# Patient Record
Sex: Male | Born: 1940
Health system: Southern US, Community
[De-identification: ages and names within clinical notes are randomized; demographics above are authoritative.]

## PROBLEM LIST (undated history)

## (undated) DIAGNOSIS — M4802 Spinal stenosis, cervical region: Secondary | ICD-10-CM

## (undated) DIAGNOSIS — Q600 Renal agenesis, unilateral: Secondary | ICD-10-CM

## (undated) DIAGNOSIS — K219 Gastro-esophageal reflux disease without esophagitis: Secondary | ICD-10-CM

## (undated) DIAGNOSIS — Z86711 Personal history of pulmonary embolism: Secondary | ICD-10-CM

## (undated) DIAGNOSIS — I499 Cardiac arrhythmia, unspecified: Secondary | ICD-10-CM

## (undated) DIAGNOSIS — I639 Cerebral infarction, unspecified: Secondary | ICD-10-CM

## (undated) DIAGNOSIS — R911 Solitary pulmonary nodule: Secondary | ICD-10-CM

## (undated) DIAGNOSIS — Z8719 Personal history of other diseases of the digestive system: Secondary | ICD-10-CM

## (undated) DIAGNOSIS — G4733 Obstructive sleep apnea (adult) (pediatric): Secondary | ICD-10-CM

## (undated) DIAGNOSIS — I10 Essential (primary) hypertension: Secondary | ICD-10-CM

## (undated) DIAGNOSIS — F341 Dysthymic disorder: Secondary | ICD-10-CM

## (undated) DIAGNOSIS — F32A Depression, unspecified: Secondary | ICD-10-CM

## (undated) DIAGNOSIS — N62 Hypertrophy of breast: Secondary | ICD-10-CM

## (undated) DIAGNOSIS — S46219A Strain of muscle, fascia and tendon of other parts of biceps, unspecified arm, initial encounter: Secondary | ICD-10-CM

## (undated) DIAGNOSIS — J45909 Unspecified asthma, uncomplicated: Secondary | ICD-10-CM

## (undated) DIAGNOSIS — M1990 Unspecified osteoarthritis, unspecified site: Secondary | ICD-10-CM

## (undated) DIAGNOSIS — H9190 Unspecified hearing loss, unspecified ear: Secondary | ICD-10-CM

## (undated) DIAGNOSIS — I82409 Acute embolism and thrombosis of unspecified deep veins of unspecified lower extremity: Secondary | ICD-10-CM

## (undated) DIAGNOSIS — R519 Headache, unspecified: Secondary | ICD-10-CM

## (undated) DIAGNOSIS — M109 Gout, unspecified: Secondary | ICD-10-CM

## (undated) DIAGNOSIS — E669 Obesity, unspecified: Secondary | ICD-10-CM

## (undated) DIAGNOSIS — I2510 Atherosclerotic heart disease of native coronary artery without angina pectoris: Secondary | ICD-10-CM

## (undated) DIAGNOSIS — E291 Testicular hypofunction: Secondary | ICD-10-CM

## (undated) DIAGNOSIS — Z9289 Personal history of other medical treatment: Secondary | ICD-10-CM

## (undated) DIAGNOSIS — G8929 Other chronic pain: Secondary | ICD-10-CM

## (undated) DIAGNOSIS — F419 Anxiety disorder, unspecified: Secondary | ICD-10-CM

## (undated) DIAGNOSIS — Z7901 Long term (current) use of anticoagulants: Secondary | ICD-10-CM

## (undated) DIAGNOSIS — T7840XA Allergy, unspecified, initial encounter: Secondary | ICD-10-CM

## (undated) DIAGNOSIS — I700 Atherosclerosis of aorta: Secondary | ICD-10-CM

## (undated) DIAGNOSIS — R202 Paresthesia of skin: Secondary | ICD-10-CM

## (undated) DIAGNOSIS — M75100 Unspecified rotator cuff tear or rupture of unspecified shoulder, not specified as traumatic: Secondary | ICD-10-CM

## (undated) DIAGNOSIS — C801 Malignant (primary) neoplasm, unspecified: Secondary | ICD-10-CM

## (undated) DIAGNOSIS — Z9889 Other specified postprocedural states: Secondary | ICD-10-CM

## (undated) HISTORY — DX: Acute embolism and thrombosis of unspecified deep veins of unspecified lower extremity: I82.409

## (undated) HISTORY — DX: Allergy, unspecified, initial encounter: T78.40XA

## (undated) HISTORY — DX: Hypertrophy of breast: N62

## (undated) HISTORY — DX: Cardiac arrhythmia, unspecified: I49.9

## (undated) HISTORY — DX: Atherosclerosis of aorta: I70.0

## (undated) HISTORY — DX: Spinal stenosis, cervical region: M48.02

## (undated) HISTORY — PX: TONSILLECTOMY: SUR1361

## (undated) HISTORY — PX: NEPHRECTOMY: SHX65

## (undated) HISTORY — PX: JOINT REPLACEMENT: SHX530

## (undated) HISTORY — DX: Gastro-esophageal reflux disease without esophagitis: K21.9

## (undated) HISTORY — DX: Obstructive sleep apnea (adult) (pediatric): G47.33

## (undated) HISTORY — DX: Testicular hypofunction: E29.1

## (undated) HISTORY — DX: Unspecified rotator cuff tear or rupture of unspecified shoulder, not specified as traumatic: M75.100

## (undated) HISTORY — PX: SHOULDER ARTHROSCOPY: SHX128

## (undated) HISTORY — DX: Headache, unspecified: R51.9

## (undated) HISTORY — DX: Dysthymic disorder: F34.1

## (undated) HISTORY — DX: Strain of muscle, fascia and tendon of other parts of biceps, unspecified arm, initial encounter: S46.219A

## (undated) HISTORY — DX: Renal agenesis, unilateral: Q60.0

## (undated) HISTORY — DX: Obesity, unspecified: E66.9

## (undated) HISTORY — DX: Paresthesia of skin: R20.2

## (undated) HISTORY — DX: Unspecified hearing loss, unspecified ear: H91.90

## (undated) HISTORY — DX: Other chronic pain: G89.29

## (undated) HISTORY — DX: Depression, unspecified: F32.A

## (undated) HISTORY — PX: COLON SURGERY: SHX602

---

## 1962-11-05 HISTORY — PX: WRIST GANGLION EXCISION: SUR520

## 1998-04-11 ENCOUNTER — Ambulatory Visit (HOSPITAL_COMMUNITY): Admission: RE | Admit: 1998-04-11 | Discharge: 1998-04-11 | Payer: Self-pay | Admitting: Gastroenterology

## 1998-09-22 ENCOUNTER — Ambulatory Visit (HOSPITAL_BASED_OUTPATIENT_CLINIC_OR_DEPARTMENT_OTHER): Admission: RE | Admit: 1998-09-22 | Discharge: 1998-09-22 | Payer: Self-pay | Admitting: Orthopedic Surgery

## 2000-01-10 ENCOUNTER — Encounter: Payer: Self-pay | Admitting: Family Medicine

## 2000-01-10 ENCOUNTER — Ambulatory Visit (HOSPITAL_COMMUNITY): Admission: RE | Admit: 2000-01-10 | Discharge: 2000-01-10 | Payer: Self-pay

## 2000-08-26 ENCOUNTER — Ambulatory Visit: Admission: RE | Admit: 2000-08-26 | Discharge: 2000-08-26 | Payer: Self-pay | Admitting: Family Medicine

## 2002-04-09 ENCOUNTER — Ambulatory Visit (HOSPITAL_COMMUNITY): Admission: RE | Admit: 2002-04-09 | Discharge: 2002-04-09 | Payer: Self-pay | Admitting: Family Medicine

## 2002-04-09 ENCOUNTER — Encounter: Payer: Self-pay | Admitting: Family Medicine

## 2002-06-01 ENCOUNTER — Emergency Department (HOSPITAL_COMMUNITY): Admission: EM | Admit: 2002-06-01 | Discharge: 2002-06-01 | Payer: Self-pay | Admitting: Emergency Medicine

## 2002-11-25 ENCOUNTER — Encounter: Admission: RE | Admit: 2002-11-25 | Discharge: 2002-11-25 | Payer: Self-pay | Admitting: Gastroenterology

## 2002-11-25 ENCOUNTER — Encounter: Payer: Self-pay | Admitting: Gastroenterology

## 2002-12-18 ENCOUNTER — Encounter: Payer: Self-pay | Admitting: Gastroenterology

## 2002-12-18 ENCOUNTER — Ambulatory Visit (HOSPITAL_COMMUNITY): Admission: RE | Admit: 2002-12-18 | Discharge: 2002-12-18 | Payer: Self-pay | Admitting: Gastroenterology

## 2003-03-30 ENCOUNTER — Ambulatory Visit (HOSPITAL_BASED_OUTPATIENT_CLINIC_OR_DEPARTMENT_OTHER): Admission: RE | Admit: 2003-03-30 | Discharge: 2003-03-30 | Payer: Self-pay | Admitting: Orthopedic Surgery

## 2003-10-11 ENCOUNTER — Ambulatory Visit (HOSPITAL_COMMUNITY): Admission: RE | Admit: 2003-10-11 | Discharge: 2003-10-11 | Payer: Self-pay | Admitting: Gastroenterology

## 2004-01-11 ENCOUNTER — Ambulatory Visit (HOSPITAL_COMMUNITY): Admission: RE | Admit: 2004-01-11 | Discharge: 2004-01-11 | Payer: Self-pay | Admitting: Family Medicine

## 2006-10-11 ENCOUNTER — Ambulatory Visit: Payer: Self-pay | Admitting: Internal Medicine

## 2006-11-19 ENCOUNTER — Ambulatory Visit: Admission: RE | Admit: 2006-11-19 | Discharge: 2006-11-19 | Payer: Self-pay | Admitting: Internal Medicine

## 2006-11-19 ENCOUNTER — Ambulatory Visit: Payer: Self-pay | Admitting: Internal Medicine

## 2006-12-09 ENCOUNTER — Ambulatory Visit: Payer: Self-pay | Admitting: Internal Medicine

## 2006-12-09 LAB — CONVERTED CEMR LAB
BUN: 23 mg/dL (ref 6–23)
Basophils Absolute: 0 10*3/uL (ref 0.0–0.1)
Creatinine, Ser: 1.5 mg/dL (ref 0.4–1.5)
Eosinophils Absolute: 0.3 10*3/uL (ref 0.0–0.6)
GFR calc non Af Amer: 50 mL/min
HCT: 39.3 % (ref 39.0–52.0)
Hemoglobin: 14.1 g/dL (ref 13.0–17.0)
MCHC: 35.8 g/dL (ref 30.0–36.0)
MCV: 87.9 fL (ref 78.0–100.0)
Monocytes Absolute: 0.6 10*3/uL (ref 0.2–0.7)
Monocytes Relative: 10.3 % (ref 3.0–11.0)
Neutrophils Relative %: 58.1 % (ref 43.0–77.0)
Potassium: 4 meq/L (ref 3.5–5.1)
RBC: 4.47 M/uL (ref 4.22–5.81)
RDW: 12.6 % (ref 11.5–14.6)
Sodium: 141 meq/L (ref 135–145)
TSH: 2.33 microintl units/mL (ref 0.35–5.50)

## 2006-12-16 ENCOUNTER — Ambulatory Visit (HOSPITAL_COMMUNITY): Admission: RE | Admit: 2006-12-16 | Discharge: 2006-12-16 | Payer: Self-pay | Admitting: Internal Medicine

## 2006-12-18 ENCOUNTER — Ambulatory Visit: Payer: Self-pay

## 2006-12-18 ENCOUNTER — Ambulatory Visit: Payer: Self-pay | Admitting: *Deleted

## 2006-12-20 ENCOUNTER — Ambulatory Visit: Payer: Self-pay

## 2006-12-20 LAB — CONVERTED CEMR LAB: INR: 1.6 (ref 0.9–2.0)

## 2006-12-23 ENCOUNTER — Ambulatory Visit: Payer: Self-pay

## 2006-12-23 LAB — CONVERTED CEMR LAB
Basophils Absolute: 0 10*3/uL (ref 0.0–0.1)
Eosinophils Absolute: 0.3 10*3/uL (ref 0.0–0.6)
Eosinophils Relative: 6.2 % — ABNORMAL HIGH (ref 0.0–5.0)
HCT: 37.2 % — ABNORMAL LOW (ref 39.0–52.0)
Hemoglobin: 13.1 g/dL (ref 13.0–17.0)
Lymphocytes Relative: 27.1 % (ref 12.0–46.0)
MCHC: 35.3 g/dL (ref 30.0–36.0)
MCV: 89 fL (ref 78.0–100.0)
Monocytes Absolute: 0.5 10*3/uL (ref 0.2–0.7)
Neutro Abs: 2.6 10*3/uL (ref 1.4–7.7)
Neutrophils Relative %: 56.1 % (ref 43.0–77.0)
WBC: 4.6 10*3/uL (ref 4.5–10.5)

## 2006-12-26 ENCOUNTER — Ambulatory Visit: Payer: Self-pay | Admitting: Cardiology

## 2007-01-02 ENCOUNTER — Ambulatory Visit: Payer: Self-pay | Admitting: *Deleted

## 2007-01-06 ENCOUNTER — Ambulatory Visit: Payer: Self-pay | Admitting: Cardiovascular Disease

## 2007-01-10 ENCOUNTER — Ambulatory Visit: Payer: Self-pay | Admitting: Internal Medicine

## 2007-02-09 ENCOUNTER — Emergency Department (HOSPITAL_COMMUNITY): Admission: EM | Admit: 2007-02-09 | Discharge: 2007-02-09 | Payer: Self-pay | Admitting: Emergency Medicine

## 2007-04-08 ENCOUNTER — Ambulatory Visit: Payer: Self-pay | Admitting: Internal Medicine

## 2008-10-15 ENCOUNTER — Inpatient Hospital Stay (HOSPITAL_COMMUNITY): Admission: RE | Admit: 2008-10-15 | Discharge: 2008-10-19 | Payer: Self-pay | Admitting: Orthopedic Surgery

## 2008-10-15 HISTORY — PX: TOTAL KNEE ARTHROPLASTY: SHX125

## 2008-11-05 HISTORY — PX: CARPAL TUNNEL RELEASE: SHX101

## 2008-11-10 ENCOUNTER — Encounter: Admission: RE | Admit: 2008-11-10 | Discharge: 2008-11-29 | Payer: Self-pay | Admitting: Orthopedic Surgery

## 2009-01-03 DIAGNOSIS — D369 Benign neoplasm, unspecified site: Secondary | ICD-10-CM

## 2009-01-03 HISTORY — DX: Benign neoplasm, unspecified site: D36.9

## 2009-07-12 ENCOUNTER — Encounter: Admission: RE | Admit: 2009-07-12 | Discharge: 2009-07-12 | Payer: Self-pay | Admitting: Family Medicine

## 2009-11-11 ENCOUNTER — Telehealth (INDEPENDENT_AMBULATORY_CARE_PROVIDER_SITE_OTHER): Payer: Self-pay | Admitting: *Deleted

## 2010-12-05 NOTE — Progress Notes (Signed)
Summary: Record request from [facility]  Request for records received from [facility]. Request forwarded to [facility]. Dena Chavis  November 11, 2009 4:17 PM

## 2011-03-20 NOTE — Op Note (Signed)
[name], [name] NO.:  [id number]   MEDICAL RECORD NO.:  [id number]          PATIENT TYPE:  INP   LOCATION:  2899                         FACILITY:  [facility]   PHYSICIAN:  Ollen Gross, M.D.    DATE OF BIRTH:  1941-07-21   DATE OF PROCEDURE:  10/15/2008  DATE OF DISCHARGE:                               OPERATIVE REPORT   PREOPERATIVE DIAGNOSES:  Osteoarthritis, left knee.   POSTOPERATIVE DIAGNOSES:  Osteoarthritis, left knee.   PROCEDURE:  Left total knee arthroplasty.   SURGEON:  Ollen Gross, M.D.   ASSISTANT:  Avel Peace PA-C   ANESTHESIA:  Spinal with Duramorph.   ESTIMATED BLOOD LOSS:  Minimal.   DRAIN:  Hemovac x1.   TOURNIQUET TIME:  41 minutes at 300 mmHg.   COMPLICATIONS:  None.   CONDITION:  Stable to recovery room.   BRIEF CLINICAL NOTE:  [name] is a 70 year old male who has end-  stage arthritis of the left knee with progressively worsening pain and  dysfunction.  He has failed nonoperative management and presents for  total knee arthroplasty.   PROCEDURE IN DETAIL:  After successful administration of spinal  anesthetic a tourniquet was placed on the left thigh and left lower  extremity prepped and draped in the usual sterile fashion.  Extremity  was wrapped in Esmarch, knee flexed, tourniquet inflated to 300 mmHg.  Midline incision made with 10-blade through subcutaneous tissue to the  level of the extensor mechanism.  A fresh blade is used make a medial  parapatellar arthrotomy.  Soft tissue of the proximal medial tibia is  subperiosteally elevated to the joint line with the knife, into the  semimembranosus bursa with a Cobb elevator.  Soft tissue laterally is  elevated with attention being paid to avoiding the patellar tendon on  tibial tubercle.  Patella subluxed laterally, knee flexed 90 degrees and  ACL and PCL removed.  Drill was used create a starting hole in the  distal femur and the canal was thoroughly irrigated.   The 5 degree left  valgus alignment guide is placed referencing off posterior condyles,  rotations marked and a block pinned to remove 11 mm of the distal femur.  It took11 because of preop flexion contracture.  Distal femoral  resection is made with an oscillating saw.  Sizing blocks placed, size 5  is most appropriate.  Rotation is marked off the epicondylar axis.  Size  5 cutting blocks placed and the anterior, posterior and chamfer cuts  made.   Tibia is subluxed forward and the menisci are removed.  Extramedullary  cutting guide is placed, referencing proximally at the medial aspect of  the tibial tubercle and distally along the second metatarsal axis and  tibial crest.  The block is pinned to remove about 2 mL off the more  deficient medial side.  Tibial resection is made with an oscillating  saw.  Size 5 is the most appropriate tibial component and the proximal  tibia is prepared with the modular drill and keel punch for the size 5.  Femoral preparation is completed with the intercondylar cut.  Size 5 mobile bearing tibial trial, size 5 posterior stabilized femoral  trial and a 10 mm posterior stabilized rotating platform insert trial  are placed.  With the 10 there is limited varus-valgus, replaced with  the 12.5 which allowed for full extension with excellent varus and  valgus anterior and posterior balance throughout full range of motion.  Patella was then everted, thickness measured to be 27 mm.  Freehand  resection was taken to 15 mm, a 41 template is placed, lug holes are  drilled, trial patella is placed and it tracks normally.  Osteophytes  removed off the posterior femur with the trial placed and all trials  removed and the cut bone surfaces are prepared with pulsatile lavage.   Cement is mixed and once ready for implantation a size 5 mobile bearing  tibial tray, size 5 posterior stabilized femur and 41 patella are  cemented into place.  The patella is held with a  clamp.  Trial 12.5-mm  inserts placed, knee held in full extension and all extruded cement  removed.  Cement was fully hardened.  Then the permanent 12.5 mm  posterior stabilized rotating platform insert is placed into the tibial  tray.  Wound is copiously irrigated with saline solution and FloSeal  injected on the posterior capsule, medial and lateral gutters and  suprapatellar area.  Moist sponge is placed and the tourniquet is  released for total time of 41 minutes.   He had some oozing due to removal of a lot of spurs and synovium.  The  cautery was used to coagulate the oozing points.  I then thoroughly  irrigated again and closed the arthrotomy over a Hemovac drain with  interrupted #1 PDS.  Flexion against gravity is 130 degrees.  Subcu was  closed with interrupted 2-0 Vicryl and subcuticular running 4-0  Monocryl.  The incision is cleaned and dried and Steri-Strips and a  bulky sterile dressing applied.  Drains hooked to suction.  Knee is  placed into a knee immobilizer, awakened and transported to recovery in  stable condition.      Ollen Gross, M.D.  Electronically Signed     FA/MEDQ  D:  10/15/2008  T:  10/15/2008  Job:  [id number]

## 2011-03-20 NOTE — H&P (Signed)
Browning, Joseph              ACCOUNT NO.:  [id number]   MEDICAL RECORD NO.:  [id number]           PATIENT TYPE:   LOCATION:                                 FACILITY:   PHYSICIAN:  Ollen Gross, M.D.         DATE OF BIRTH:   DATE OF ADMISSION:  10/15/2008  DATE OF DISCHARGE:                              HISTORY & PHYSICAL   CHIEF COMPLAINT:  Left knee pain.   HISTORY OF PRESENT ILLNESS:  The patient is a 70 year old male seen by  Dr. Lequita Halt who is the husband of a previous patient.  [name] has  had ongoing problems with bilateral knee pain.  The left knee is more  symptomatic and problematic than the right.  He is found to have end-  stage arthritis, moderate-to-advanced on the right but even more severe  on the left.  It has been getting much worse as time goes by.  He has  been treated conservatively in the past with multiple cortisone  injections and also gel injections.  He has reached a point where he  would like to have something done about it.  He has been seen by Dr.  Cliffton Asters preoperatively and felt to be stable for surgery.  He is on  Coumadin preoperatively which will be stopped 5 days preoperatively.  He  will be placed on Lovenox bridge immediately postop along with his  Coumadin.  The patient was subsequently admitted to [facility]   ALLERGIES:  No known drug allergies.   INTOLERANCES:  CODEINE causes him to pass out.  He is also unable to  take VICODIN, HYDROCODONE, PERCOCET, OXYCODONE, or any CODEINE  DERIVATIVES.   CURRENT MEDICATIONS:  Lexapro, Lamictal, Depakote, warfarin, generic  Ambien, Testrin, Tylenol.   PAST MEDICAL HISTORY:  1. Some high-pitched hearing loss.  2. Asthma.  3. History of DVTs and also PE.  4. Arthritis.  5. Childhood illnesses of measles and mumps.   PAST SURGICAL HISTORY:  1. Ganglion surgery.  2. Wrist surgery.  3. Shoulder surgery.  4. Right knee surgery.   SOCIAL HISTORY:  Father with history of diabetes  and blood clots.  Mother deceased, history of colon cancer.  The patient has 3 siblings,  all healthy and 4 children, all healthy.   SOCIAL HISTORY:  Married.  Works in [employer].  Past smoker.  Drinks about 1-  3 beers per week.  4 children.  He does have a caregiver lined up after  surgery.   REVIEW OF SYSTEMS:  GENERAL:  No fevers, chills, occasional night  sweats.  NEUROLOGIC:  No seizures, syncope, or paralysis.  He does have  a little bit of high-pitched hearing loss.  RESPIRATORY:  No shortness  of breath at rest.  Gets a little short of breath on exertion.  No  productive cough or hemoptysis.  CARDIOVASCULAR:  No chest pain,  orthopnea.  GI:  No nausea, vomiting, diarrhea, or constipation.  GU:  No dysuria, hematuria, or discharge.  MUSCULOSKELETAL:  Left knee.   PHYSICAL EXAMINATION:  VITAL SIGNS:  Pulse 78, respiration 16,  blood  pressure 132/78.  GENERAL:  A 70 year old white male, well nourished, well developed, in  no acute distress.  He is alert, oriented, cooperative, pleasant,  accompanied by his wife.  HEENT:  Normocephalic, atraumatic.  Pupils are equal, round, and  reactive.  EOMs intact.  Oropharynx is clear.  Only reading glasses.  NECK:  Supple.  No bruits.  CHEST: Clear.  HEART:  Regular rate and rhythm.  No murmur.  ABDOMEN:  Soft, round, slightly protuberant abdomen.  Bowel sounds  present.  RECTAL:  Not done.  BREASTS:  Not done.  GENITALIA:  Not done.  EXTREMITIES:  Left knee, trace effusion, moderate crepitus, range of  motion of 5-110.   IMPRESSION:  Osteoarthritis of the left knee.   PLAN:  The patient admitted to [facility] to undergo a left  total knee replacement arthroplasty.  Surgery will be performed by Dr.  Ollen Gross.      [name], P.A.C.      Ollen Gross, M.D.  Electronically Signed    ALP/MEDQ  D:  10/14/2008  T:  10/15/2008  Job:  [id number]   cc:   Stacie Acres. Cliffton Asters, M.D.  Ollen Gross, M.D.

## 2011-03-23 NOTE — Assessment & Plan Note (Signed)
[name]                             PULMONARY OFFICE NOTE   NAME:APPELGETYomar, [name]                     MRN:          [id number]  DATE:04/09/2007                            DOB:          1941/09/05    PULMONARY CONSULTATION:   REASON FOR CONSULTATION:  Preoperative evaluation.   HISTORY:  This is an exceptionally complicated 70 year old white male  whom I saw for chronic dyspnea on exertion related to morbid obesity  dating back now 2-3 years.  His workup showed that he desaturated with  exercise and had a CT scan subsequently in February of 2008 showing a  pulmonary embolism with negative venous Dopplers.  He was started on  Coumadin and also a methacholine challenge test was done to further work-  up his dyspnea, which was positive for reduction in air flow, but did  not reproduce his symptoms.   Since that time, he states he has been gradually improving with weight  loss (peak weight was 241 and now down to 225).  He says he can walk  about 3 miles an hour for 7 minutes up a slight grade before he has to  stop on his treadmill.  He is able to mow grass up to an hour and a half  without stopping at all.  He says he is using his Combivent no more than  twice weekly while being maintained on Symbicort 2 puffs b.i.d. (does  not know the strength, however).  He denies any nocturnal symptoms or  early morning exacerbations of coughing or wheezing and is able to lie  flat all night.  He is now considering left knee surgery.   There has been no real variability in terms of his dyspnea with weather,  environmental change or obvious triggers other than exercise in terms of  dyspnea.   PAST MEDICAL HISTORY:  1. Morbid obesity with a target weight of around 180.  2. Status post multiple orthopedic surgeries.  3. Gastroesophageal reflux disease status post esophageal stretching      in December of 2004.   ALLERGIES:  1. CODEINE.  2. NOVOCAIN.  3.  ALL THE NON-STEROIDALS.   MEDICATIONS:  1. Ambien 10 mg q.h.s.  2. Wellbutrin 300 mg daily.  3. Hydrochlorothiazide 25 mg q.a.m.  4. Coumadin 5 mg daily.  5. Depakote 500 mg daily.  6. Symbicort unspecified strength two puffs b.i.d.  7. Combivent 2-4 puffs a week.   SOCIAL HISTORY:  He quit smoking 24 years ago.  He has no unusual  exposure history.   FAMILY HISTORY:  Reviewed in detail and significant for the absence of  family history of asthma or atrophy.  Positive for colon cancer in his  mother and rheumatism in his father.   REVIEW OF SYSTEMS:  Taken in detail and negative except as outlined  above.   PHYSICAL EXAMINATION:  GENERAL:  This is an ambulatory white male with  an unusual affect and attitude.  VITAL SIGNS:  He is afebrile with stable vital signs with a weight now  of 225.  Blood pressure is 118/60.  HEENT:  Unremarkable.  Oropharynx clear.  NECK:  Supple without cervical adenopathy or tenderness.  Trachea is  midline.  No thyromegaly.  LUNGS:  Lung fields are perfectly clear bilaterally to auscultation and  percussion.  HEART:  Regular rate and rhythm without murmur, gallop, or rub.  ABDOMEN:  Soft, benign.  EXTREMITIES:  Warm without calf tenderness.  No clubbing, cyanosis, or  edema.   Hemosaturation 99% on room air.   IMPRESSION:  Chronic dyspnea on exertion associated with pulmonary  embolism on CT scan, but negative venous Dopplers.  He has now been on  Coumadin for 4 months with no evidence clinically of right heart strain,  and therefore most likely has good right ventricular reserve and no  significant risks for immediate deep vein thrombosis/pulmonary embolism  for surgery.  Ideally, we would like to wait 6 months after a diagnosis  of pulmonary embolism before proceeding to surgery, but since this was a  relatively small peripheral pulmonary embolism, I think the total clot  burden was very low, and his risk, therefore, is also very low.   Perioperative risk is also very low.   In terms of his asthma control, it appears to be excellent.  Note that  although he does have a positive methacholine challenge test, he really  does not have the clinical pattern of an asthmatic.  His pattern, in  fact, has been more of one of chronic obesity with deconditioning which  is improving with weight loss and will improve further once his knee has  been repaired.   Therefore, he is cleared for surgery, but I would strongly recommend  that he be covered perioperatively with IV heparin, and then started  back on Coumadin immediately as soon as hemostasis is assured.     [name]. Sherene Sires, MD, [facility]  Electronically Signed    MBW/MedQ  DD: 04/09/2007  DT: 04/09/2007  Job #: [id number]   cc:   [name]. [name], M.D.

## 2011-03-23 NOTE — Discharge Summary (Signed)
[name], [name]              ACCOUNT NO.:  [id number]   MEDICAL RECORD NO.:  [id number]          PATIENT TYPE:  INP   LOCATION:  5013                         FACILITY:  [facility]   PHYSICIAN:  Ollen Gross, M.D.    DATE OF BIRTH:  1941/04/21   DATE OF ADMISSION:  10/15/2008  DATE OF DISCHARGE:  10/19/2008                               DISCHARGE SUMMARY   ADMITTING DIAGNOSES:  1. Osteoarthritis of left knee.  2. High-pitch hearing loss.  3. Asthma.  4. History of deep vein thromboses and pulmonary embolism.  5. Arthritis.  6. Childhood illnesses measles and mumps.   DISCHARGE DIAGNOSES:  1. Osteoarthritis of left knee, status post left total knee      replacement and arthroplasty.  2. Postoperative blood loss anemia, did not require transfusion.  3. Mild postoperative hyponatremia, improved.  4. High-pitch hearing loss.  5. Asthma.  6. History of deep vein thromboses and pulmonary embolism.  7. Arthritis.  8. Childhood illnesses measles and mumps.   PROCEDURE:  October 15, 2008, left total knee.   SURGEON:  Ollen Gross, M.D.   ASSISTANT:  Alexzandrew L. Perkins, P.A.C.   ANESTHESIA:  Spinal with Duramorph.   CONSULTS:  None.   BRIEF HISTORY:  [name] is a 70 year old male with end-stage  arthritis of left knee, progressive worsening pain.  He has failed  nonoperative management, now presents for total knee arthroplasty.   LABORATORY DATA:  Preop CBC, hemoglobin of 15.5, hematocrit of 44.9,  white cell count 5.4, and platelets 233.  Postop hemoglobin 12.3, then  down to 9.4, then 8.8, last noted H&H was 8.4 and 23.7.  PT/PTT preop  14.6 and 30 respectively.  INR 1.1.  Serial protimes followed per  Coumadin protocol.  Last noted PT/INR 22.6 and 1.9.  Chem panel, BUN on  admission within normal limits.  Postop sodium did go from 137 to 134,  back up to 137, creatinine did bump from 1.3 to 1.6, back down to 1.5.  Preop UA, small leukocyte esterase and 3-6 white  cells.  Followup UA,  small hemoglobin, only 0-2 white cells, 3-6 red cells, otherwise  negative.  Blood group type A+.  Urine culture no growth.   HOSPITAL COURSE:  The patient was admitted to [facility], taken  to the OR, and underwent above-stated procedure without complication.  The patient tolerated the procedure well, later transferred to recovery  room in orthopedic floor.  Started on PCA and p.r.n. analgesic pain  control following surgery, given 24 hours of postop IV antibiotics.  Doing pretty well on morning of day 1.  Hemovac was still there and was  removed.  The patient had some slight bump in his potassium and  creatinine, so treated with fluids and diuresed well.  Started getting  up with PT for ambulation.  By day 2, the patient is doing much better.  Dressing changed, incision looked good.  By day 2, the patient was up  walking about 20 or so feet, weaned over to p.o. meds, continued  progressive physical therapy.  By day 3, hemoglobin was down  lower to  8.8, but the patient was asymptomatic with a low hemoglobin, started on  iron, did have a little bit of a temperature on postop day 3.  Temperature went up to 101.5, so we checked a urinalysis which was  turned out to be negative.  Also, a chest x-ray was done which showed  some increased passive medial left lung base consistent with atelectasis  or pneumonia.  Recommend followup.  Encouraged incentive spirometer.  The patient did improve, and by postop day 4, temperature was back down.  The patient was doing better, weaned over to p.o. meds, walking greater  than 200 feet, and was discharged home.   DISCHARGE PLAN:  1. Discharged home on October 19, 2008.  2. Discharge diagnoses, please see above.  3. Discharge meds Ultracet, Robaxin, Coumadin, and iron.   FOLLOWUP:  Two weeks.   ACTIVITIES:  Total knee protocol, home health PT, home health nursing.   DISPOSITION:  Home.   CONDITION UPON DISCHARGE:   Improved.       Alexzandrew L. Perkins, P.A.C.      Ollen Gross, M.D.  Electronically Signed    ALP/MEDQ  D:  12/14/2008  T:  12/15/2008  Job:  [id number]   cc:   Ollen Gross, M.D.  Stacie Acres Cliffton Asters, M.D.

## 2011-07-06 ENCOUNTER — Other Ambulatory Visit: Payer: Self-pay | Admitting: Family Medicine

## 2011-07-06 ENCOUNTER — Ambulatory Visit
Admission: RE | Admit: 2011-07-06 | Discharge: 2011-07-06 | Disposition: A | Discharge status: Home / Self Care | Payer: Medicare Other | Source: Ambulatory Visit | Attending: Family Medicine | Admitting: Family Medicine

## 2011-07-06 DIAGNOSIS — M79669 Pain in unspecified lower leg: Secondary | ICD-10-CM

## 2011-08-07 ENCOUNTER — Other Ambulatory Visit (HOSPITAL_COMMUNITY): Payer: Self-pay | Admitting: Oncology

## 2011-08-07 ENCOUNTER — Encounter (HOSPITAL_BASED_OUTPATIENT_CLINIC_OR_DEPARTMENT_OTHER): Payer: Medicare Other | Admitting: Oncology

## 2011-08-07 DIAGNOSIS — I749 Embolism and thrombosis of unspecified artery: Secondary | ICD-10-CM

## 2011-08-07 DIAGNOSIS — Z86711 Personal history of pulmonary embolism: Secondary | ICD-10-CM

## 2011-08-07 DIAGNOSIS — D751 Secondary polycythemia: Secondary | ICD-10-CM

## 2011-08-07 DIAGNOSIS — Z7901 Long term (current) use of anticoagulants: Secondary | ICD-10-CM

## 2011-08-07 LAB — CBC WITH DIFFERENTIAL/PLATELET
BASO%: 0.3 % (ref 0.0–2.0)
Basophils Absolute: 0 10*3/uL (ref 0.0–0.1)
EOS%: 0.7 % (ref 0.0–7.0)
HGB: 18.6 g/dL — ABNORMAL HIGH (ref 13.0–17.1)
MCH: 31.9 pg (ref 27.2–33.4)
MCHC: 34.3 g/dL (ref 32.0–36.0)
MCV: 92.8 fL (ref 79.3–98.0)
MONO%: 8.6 % (ref 0.0–14.0)
RBC: 5.84 10*6/uL — ABNORMAL HIGH (ref 4.20–5.82)
RDW: 15.2 % — ABNORMAL HIGH (ref 11.0–14.6)

## 2011-08-07 LAB — PROTHROMBIN TIME: INR: 3.61 — ABNORMAL HIGH (ref <1.50)

## 2011-08-10 LAB — CBC
HCT: 24.9 % — ABNORMAL LOW (ref 39.0–52.0)
HCT: 26.6 % — ABNORMAL LOW (ref 39.0–52.0)
HCT: 35.3 % — ABNORMAL LOW (ref 39.0–52.0)
HCT: 44.9 % (ref 39.0–52.0)
Hemoglobin: 15.5 g/dL (ref 13.0–17.0)
Hemoglobin: 9.4 g/dL — ABNORMAL LOW (ref 13.0–17.0)
MCHC: 34.6 g/dL (ref 30.0–36.0)
MCHC: 34.7 g/dL (ref 30.0–36.0)
MCHC: 35.3 g/dL (ref 30.0–36.0)
MCHC: 35.6 g/dL (ref 30.0–36.0)
MCV: 90.4 fL (ref 78.0–100.0)
MCV: 91.5 fL (ref 78.0–100.0)
MCV: 91.8 fL (ref 78.0–100.0)
Platelets: 148 10*3/uL — ABNORMAL LOW (ref 150–400)
Platelets: 152 10*3/uL (ref 150–400)
Platelets: 204 10*3/uL (ref 150–400)
Platelets: 228 10*3/uL (ref 150–400)
Platelets: 233 10*3/uL (ref 150–400)
RDW: 12.3 % (ref 11.5–15.5)
RDW: 12.5 % (ref 11.5–15.5)
RDW: 12.9 % (ref 11.5–15.5)
RDW: 13 % (ref 11.5–15.5)
RDW: 13.1 % (ref 11.5–15.5)
WBC: 7.5 10*3/uL (ref 4.0–10.5)

## 2011-08-10 LAB — PROTIME-INR
INR: 1.1 (ref 0.00–1.49)
INR: 1.9 — ABNORMAL HIGH (ref 0.00–1.49)
INR: 2.3 — ABNORMAL HIGH (ref 0.00–1.49)
Prothrombin Time: 14.6 seconds (ref 11.6–15.2)
Prothrombin Time: 22.6 seconds — ABNORMAL HIGH (ref 11.6–15.2)
Prothrombin Time: 22.7 seconds — ABNORMAL HIGH (ref 11.6–15.2)
Prothrombin Time: 24 seconds — ABNORMAL HIGH (ref 11.6–15.2)

## 2011-08-10 LAB — URINALYSIS, ROUTINE W REFLEX MICROSCOPIC
Glucose, UA: NEGATIVE mg/dL
Leukocytes, UA: NEGATIVE
Nitrite: NEGATIVE
Nitrite: NEGATIVE
Protein, ur: NEGATIVE mg/dL
Specific Gravity, Urine: 1.012 (ref 1.005–1.030)
Urobilinogen, UA: 0.2 mg/dL (ref 0.0–1.0)
pH: 6 (ref 5.0–8.0)

## 2011-08-10 LAB — LACTATE DEHYDROGENASE: LDH: 175 U/L (ref 94–250)

## 2011-08-10 LAB — HYPERCOAGULABLE PANEL, COMPREHENSIVE
AntiThromb III Func: 123 % (ref 76–126)
Anticardiolipin IgA: 6 APL U/mL (ref <22)
Anticardiolipin IgG: 56 GPL U/mL — ABNORMAL HIGH (ref <23)
Anticardiolipin IgM: 18 MPL U/mL — ABNORMAL HIGH (ref <11)
Beta-2 Glyco I IgG: 1 G Units (ref <20)
Beta-2-Glycoprotein I IgM: 34 M Units — ABNORMAL HIGH (ref <20)
DRVVT: 110.3 secs — ABNORMAL HIGH (ref 34.1–42.2)
PTT Lupus Anticoagulant: 53.3 secs — ABNORMAL HIGH (ref 30.0–45.6)
PTTLA 4:1 Mix: 38.8 secs (ref 30.0–45.6)
Protein S Total: 49 % — ABNORMAL LOW (ref 60–150)

## 2011-08-10 LAB — COMPREHENSIVE METABOLIC PANEL
ALT: 18 U/L (ref 0–53)
Alkaline Phosphatase: 48 U/L (ref 39–117)
BUN: 16 mg/dL (ref 6–23)
CO2: 27 mEq/L (ref 19–32)
Calcium: 9.5 mg/dL (ref 8.4–10.5)
Creatinine, Ser: 1.19 mg/dL (ref 0.50–1.35)
Glucose, Bld: 95 mg/dL (ref 70–99)
Sodium: 137 mEq/L (ref 135–145)
Total Bilirubin: 0.5 mg/dL (ref 0.3–1.2)
Total Protein: 7 g/dL (ref 6.0–8.3)

## 2011-08-10 LAB — BASIC METABOLIC PANEL
BUN: 12 mg/dL (ref 6–23)
BUN: 16 mg/dL (ref 6–23)
CO2: 27 mEq/L (ref 19–32)
CO2: 28 mEq/L (ref 19–32)
Calcium: 7.6 mg/dL — ABNORMAL LOW (ref 8.4–10.5)
Chloride: 101 mEq/L (ref 96–112)
Creatinine, Ser: 1.6 mg/dL — ABNORMAL HIGH (ref 0.4–1.5)
GFR calc non Af Amer: 46 mL/min — ABNORMAL LOW (ref >60)
Glucose, Bld: 140 mg/dL — ABNORMAL HIGH (ref 70–99)
Glucose, Bld: 150 mg/dL — ABNORMAL HIGH (ref 70–99)
Potassium: 5 mEq/L (ref 3.5–5.1)

## 2011-08-10 LAB — DIFFERENTIAL
Basophils Absolute: 0 10*3/uL (ref 0.0–0.1)
Basophils Relative: 0 % (ref 0–1)
Eosinophils Absolute: 0.1 10*3/uL (ref 0.0–0.7)
Monocytes Relative: 12 % (ref 3–12)
Neutro Abs: 4.3 10*3/uL (ref 1.7–7.7)
Neutrophils Relative %: 70 % (ref 43–77)

## 2011-08-10 LAB — URINE MICROSCOPIC-ADD ON

## 2011-08-10 LAB — TYPE AND SCREEN
ABO/RH(D): A POS
Antibody Screen: NEGATIVE

## 2011-08-10 LAB — APTT: aPTT: 32 seconds (ref 24–37)

## 2011-08-10 LAB — ABO/RH: ABO/RH(D): A POS

## 2011-08-10 LAB — URINE CULTURE: Special Requests: NEGATIVE

## 2011-08-20 ENCOUNTER — Other Ambulatory Visit: Payer: Self-pay | Admitting: Family Medicine

## 2011-08-20 DIAGNOSIS — R911 Solitary pulmonary nodule: Secondary | ICD-10-CM

## 2011-08-22 ENCOUNTER — Ambulatory Visit
Admission: RE | Admit: 2011-08-22 | Discharge: 2011-08-22 | Disposition: A | Discharge status: Home / Self Care | Payer: Medicare Other | Source: Ambulatory Visit | Attending: Family Medicine | Admitting: Family Medicine

## 2011-08-22 DIAGNOSIS — R911 Solitary pulmonary nodule: Secondary | ICD-10-CM

## 2012-02-26 ENCOUNTER — Other Ambulatory Visit (HOSPITAL_COMMUNITY): Payer: Self-pay | Admitting: Urology

## 2012-02-26 DIAGNOSIS — R310 Gross hematuria: Secondary | ICD-10-CM

## 2012-02-26 DIAGNOSIS — N261 Atrophy of kidney (terminal): Secondary | ICD-10-CM

## 2012-03-03 ENCOUNTER — Encounter (HOSPITAL_COMMUNITY)
Admission: RE | Admit: 2012-03-03 | Discharge: 2012-03-03 | Disposition: A | Discharge status: Home / Self Care | Payer: Medicare Other | Source: Ambulatory Visit | Attending: Urology | Admitting: Urology

## 2012-03-03 DIAGNOSIS — N261 Atrophy of kidney (terminal): Secondary | ICD-10-CM

## 2012-03-03 DIAGNOSIS — N281 Cyst of kidney, acquired: Secondary | ICD-10-CM | POA: Insufficient documentation

## 2012-03-03 DIAGNOSIS — R310 Gross hematuria: Secondary | ICD-10-CM | POA: Insufficient documentation

## 2012-03-03 DIAGNOSIS — N269 Renal sclerosis, unspecified: Secondary | ICD-10-CM | POA: Insufficient documentation

## 2012-03-03 MED ORDER — TECHNETIUM TC 99M MERTIATIDE
15.0000 | Freq: Once | INTRAVENOUS | Status: AC | PRN
  Administered 2012-03-03: 15 via INTRAVENOUS

## 2012-03-03 MED ORDER — FUROSEMIDE 10 MG/ML IJ SOLN
40.0000 mg | Freq: Once | INTRAMUSCULAR | Status: DC
  Filled 2012-03-03: qty 4

## 2013-07-09 ENCOUNTER — Other Ambulatory Visit: Payer: Self-pay | Admitting: Urology

## 2013-07-10 ENCOUNTER — Encounter (HOSPITAL_BASED_OUTPATIENT_CLINIC_OR_DEPARTMENT_OTHER): Payer: Self-pay | Admitting: *Deleted

## 2013-07-13 ENCOUNTER — Encounter (HOSPITAL_BASED_OUTPATIENT_CLINIC_OR_DEPARTMENT_OTHER): Payer: Self-pay | Admitting: *Deleted

## 2013-07-13 NOTE — Progress Notes (Signed)
NPO AFTER MN. ARRIVES AT 1015. NEEDS HG AND PT/INR. WILL TAKE ACID REDUCER OTC AM OF SURG W/ SIP OF WATER. WILL DO HIBICLENS SHOWER AM OF SURG.

## 2013-07-20 ENCOUNTER — Ambulatory Visit (HOSPITAL_BASED_OUTPATIENT_CLINIC_OR_DEPARTMENT_OTHER): Admission: RE | Admit: 2013-07-20 | Payer: Medicare Other | Source: Ambulatory Visit | Admitting: Urology

## 2013-07-20 HISTORY — DX: Other specified postprocedural states: Z98.890

## 2013-07-20 HISTORY — DX: Gout, unspecified: M10.9

## 2013-07-20 HISTORY — DX: Personal history of other diseases of the digestive system: Z87.19

## 2013-07-20 HISTORY — DX: Obstructive sleep apnea (adult) (pediatric): G47.33

## 2013-07-20 HISTORY — DX: Solitary pulmonary nodule: R91.1

## 2013-07-20 HISTORY — DX: Unspecified osteoarthritis, unspecified site: M19.90

## 2013-07-20 HISTORY — DX: Gastro-esophageal reflux disease without esophagitis: K21.9

## 2013-07-20 HISTORY — DX: Long term (current) use of anticoagulants: Z79.01

## 2013-07-20 HISTORY — DX: Personal history of pulmonary embolism: Z86.711

## 2013-07-20 SURGERY — EXCISION LIPOMA
Anesthesia: General | Laterality: Right

## 2014-01-04 NOTE — Progress Notes (Signed)
Need orders in EPIC.  Surgery scheduled on 01/18/14.  Preop on 01/13/14 at 900am.  Thank You.

## 2014-01-05 ENCOUNTER — Other Ambulatory Visit: Payer: Self-pay | Admitting: Orthopedic Surgery

## 2014-01-11 ENCOUNTER — Encounter (HOSPITAL_COMMUNITY): Payer: Self-pay | Admitting: Pharmacy Technician

## 2014-01-12 ENCOUNTER — Other Ambulatory Visit (HOSPITAL_COMMUNITY): Payer: Self-pay | Admitting: Orthopedic Surgery

## 2014-01-12 NOTE — Progress Notes (Signed)
Clearance with note Dr Dema Severin chart, [place] with labs 11/14 chart

## 2014-01-13 ENCOUNTER — Encounter (INDEPENDENT_AMBULATORY_CARE_PROVIDER_SITE_OTHER): Payer: Self-pay

## 2014-01-13 ENCOUNTER — Encounter (HOSPITAL_COMMUNITY): Payer: Self-pay

## 2014-01-13 ENCOUNTER — Ambulatory Visit (HOSPITAL_COMMUNITY)
Admission: RE | Admit: 2014-01-13 | Discharge: 2014-01-13 | Disposition: A | Discharge status: Home / Self Care | Payer: Medicare Other | Source: Ambulatory Visit | Attending: Orthopedic Surgery | Admitting: Orthopedic Surgery

## 2014-01-13 ENCOUNTER — Encounter (HOSPITAL_COMMUNITY)
Admission: RE | Admit: 2014-01-13 | Discharge: 2014-01-13 | Disposition: A | Discharge status: Home / Self Care | Payer: Medicare Other | Source: Ambulatory Visit | Attending: Orthopedic Surgery | Admitting: Orthopedic Surgery

## 2014-01-13 DIAGNOSIS — Z01812 Encounter for preprocedural laboratory examination: Secondary | ICD-10-CM | POA: Insufficient documentation

## 2014-01-13 DIAGNOSIS — Z01818 Encounter for other preprocedural examination: Secondary | ICD-10-CM | POA: Insufficient documentation

## 2014-01-13 DIAGNOSIS — J45909 Unspecified asthma, uncomplicated: Secondary | ICD-10-CM | POA: Insufficient documentation

## 2014-01-13 DIAGNOSIS — Z01810 Encounter for preprocedural cardiovascular examination: Secondary | ICD-10-CM | POA: Insufficient documentation

## 2014-01-13 HISTORY — DX: Anxiety disorder, unspecified: F41.9

## 2014-01-13 HISTORY — DX: Malignant (primary) neoplasm, unspecified: C80.1

## 2014-01-13 HISTORY — DX: Personal history of other medical treatment: Z92.89

## 2014-01-13 HISTORY — DX: Unspecified asthma, uncomplicated: J45.909

## 2014-01-13 LAB — COMPREHENSIVE METABOLIC PANEL
ALBUMIN: 4.3 g/dL (ref 3.5–5.2)
ALT: 14 U/L (ref 0–53)
AST: 18 U/L (ref 0–37)
Alkaline Phosphatase: 61 U/L (ref 39–117)
BUN: 17 mg/dL (ref 6–23)
CALCIUM: 9.3 mg/dL (ref 8.4–10.5)
CO2: 26 mEq/L (ref 19–32)
Chloride: 104 mEq/L (ref 96–112)
Creatinine, Ser: 1.1 mg/dL (ref 0.50–1.35)
GFR calc Af Amer: 77 mL/min — ABNORMAL LOW (ref >90)
GFR calc non Af Amer: 66 mL/min — ABNORMAL LOW (ref >90)
Glucose, Bld: 103 mg/dL — ABNORMAL HIGH (ref 70–99)
Potassium: 4.4 mEq/L (ref 3.7–5.3)
Sodium: 142 mEq/L (ref 137–147)
TOTAL PROTEIN: 7.3 g/dL (ref 6.0–8.3)
Total Bilirubin: 0.4 mg/dL (ref 0.3–1.2)

## 2014-01-13 LAB — CBC
HCT: 40.1 % (ref 39.0–52.0)
Hemoglobin: 14.4 g/dL (ref 13.0–17.0)
MCH: 32 pg (ref 26.0–34.0)
MCHC: 35.9 g/dL (ref 30.0–36.0)
MCV: 89.1 fL (ref 78.0–100.0)
PLATELETS: 183 10*3/uL (ref 150–400)
RBC: 4.5 MIL/uL (ref 4.22–5.81)
RDW: 12.6 % (ref 11.5–15.5)
WBC: 4.2 10*3/uL (ref 4.0–10.5)

## 2014-01-13 LAB — PROTIME-INR
INR: 2.45 — ABNORMAL HIGH (ref 0.00–1.49)
Prothrombin Time: 25.8 seconds — ABNORMAL HIGH (ref 11.6–15.2)

## 2014-01-13 LAB — URINALYSIS, ROUTINE W REFLEX MICROSCOPIC
Bilirubin Urine: NEGATIVE
Glucose, UA: NEGATIVE mg/dL
Hgb urine dipstick: NEGATIVE
KETONES UR: NEGATIVE mg/dL
LEUKOCYTES UA: NEGATIVE
Nitrite: NEGATIVE
PROTEIN: NEGATIVE mg/dL
Specific Gravity, Urine: 1.007 (ref 1.005–1.030)
Urobilinogen, UA: 0.2 mg/dL (ref 0.0–1.0)
pH: 5.5 (ref 5.0–8.0)

## 2014-01-13 LAB — SURGICAL PCR SCREEN
MRSA, PCR: NEGATIVE
Staphylococcus aureus: NEGATIVE

## 2014-01-13 LAB — APTT: aPTT: 42 seconds — ABNORMAL HIGH (ref 24–37)

## 2014-01-13 LAB — ABO/RH: ABO/RH(D): A POS

## 2014-01-13 NOTE — Patient Instructions (Signed)
      Your procedure is scheduled on:  01/18/14  MONDAY  Report to [place] at   10:50    AM.  Call this number if you have problems the morning of surgery: [place]   Do not eat food After Midnight.SUNDAY NIGHT-- MAY HAVE CLEAR LIQUIDS Monday MORNING UNTIL 0750am-  THEN NOTHING   Take these medicines the morning of surgery with A SIP OF WATER: Allopurinol, Lexapro May use Albuterol, Combivent if needed   .  Contacts, dentures or partial plates, or metal hairpins  can not be worn to surgery. Your family will be responsible for glasses, dentures, hearing aides while you are in surgery  Leave suitcase in the car. After surgery it may be brought to your room.  For patients admitted to the hospital, checkout time is 11:00 AM day of  discharge.                DO NOT WEAR JEWELRY, LOTIONS, POWDERS, OR PERFUMES.  WOMEN-- DO NOT SHAVE LEGS OR UNDERARMS FOR 48 HOURS BEFORE SHOWERS. MEN MAY SHAVE FACE.  Patients discharged the day of surgery will not be allowed to drive home. IF going home the day of surgery, you must have a driver and someone to stay with you for the first 24 hours  Name and phone number of your driver:    wife                                                                    Please read over the following fact sheets that you were given: MRSA Information, Incentive Spirometry Sheet, Blood Transfusion Sheet  Information                     FAILURE TO [name]                                                  Patient Signature _____________________________

## 2014-01-15 NOTE — Progress Notes (Signed)
Left message on cell phone of [phone] time change for surgery.  Arrive at 0915am.  Surgery time- 1215pm.  NPO after midnite.  Asked patient to return call to know he received message.

## 2014-01-15 NOTE — Progress Notes (Signed)
Patient returned call and stated he had received message regarding time change and voiced understanding.

## 2014-01-17 ENCOUNTER — Other Ambulatory Visit: Payer: Self-pay | Admitting: Orthopedic Surgery

## 2014-01-17 NOTE — H&P (Signed)
[name] P. Music DOB: [birth year] Married / Language: English / Race: White Male  Date of Admission:  01-18-2014  Chief Complaint:  Right Knee Pain  History of Present Illness The patient is a 73 year old male who comes in for a preoperative History and Physical. The patient is scheduled for a right total knee arthroplasty to be performed by Dr. Dione Plover. Aluisio, MD at [facility] on 01-18-2014. The patient is a 73 year old male who presents for follow up of their knee. The patient is being followed for their right knee pain. Symptoms reported include: pain (medial and lateral). The following medication has been used for pain control: Tylenol. The patient comes in today 5 years out from left total knee arthroplasty. The patient states that he is doing well at this time. Note for "Post TKA": Patient states that he has pain in his right knee at times. His left knee has done great. He has no pain at all on the left knee and is very pleased with that. His right knee has gotten progressively worse over time. He originally injured it as a teenager when he fractured his patella. He said he has had problems since. The knee now hurts at all times. It gives out on him frequently. He has had cortisone and viscosupplements in the past which did not help. He is at a stage now where he is ready to go ahead and get the right knee fixed. They have been treated conservatively in the past for the above stated problem and despite conservative measures, they continue to have progressive pain and severe functional limitations and dysfunction. They have failed non-operative management including home exercise, medications. It is felt that they would benefit from undergoing total joint replacement. Risks and benefits of the procedure have been discussed with the patient and they elect to proceed with surgery. There are no active contraindications to surgery such as ongoing infection or rapidly progressive  neurological disease.   Allergies NSAIDs Codeine/Codeine Derivatives Norvasc *CALCIUM CHANNEL BLOCKERS* CeleBREX *ANALGESICS - ANTI-INFLAMMATORY* Viagra *CARDIOVASCULAR AGENTS - MISC.* Cymbalta *ANTIDEPRESSANTS* Flonase *NASAL AGENTS - SYSTEMIC AND TOPICAL* Advair Diskus *ANTIASTHMATIC AND BRONCHODILATOR AGENTS* BuSpar *ANTIANXIETY AGENTS* Lisinopril *ANTIHYPERTENSIVES*. Headaches and Nausea Symbicort *ANTIASTHMATIC AND BRONCHODILATOR AGENTS*  Problem List/Past Medical Primary osteoarthritis of one knee (715.16) Pain, Hip (719.45) Lumbar spine pain (724.2) Chronic kidney disease, stage 3 (585.3) Sleep Apnea Osteoarthritis High blood pressure Skin Cancer Rheumatoid Arthritis Gout Asthma Chronic Pain Impaired Vision Impaired Hearing DVT Pulmonary Embolism Hypogonadism Anxiety Disorder Tubular Adenomatous Polyps   Family History Drug / Alcohol Addiction. First Degree Relatives. sister, brother and child Cancer. mother, father and grandmother fathers side Diabetes Mellitus. father Cerebrovascular Accident. father Osteoarthritis. father and grandmother mothers side Osteoporosis. grandmother mothers side Father. HTN, DM, CVA, DVT's Mother. Deceased. age 38; Colon Cancer    Social History Illicit drug use. no Living situation. live with spouse Tobacco / smoke exposure. yes outdoors only Tobacco use. Former smoker. former smoker; smoke(d) 1 pack(s) per day; uses less than half 1/2 can(s) smokeless per week Marital status. married Pain Contract. no Drug/Alcohol Rehab (Currently). no Drug/Alcohol Rehab (Previously). no Children. 4 Current work status. working part time Exercise. Exercises rarely Alcohol use. current drinker; drinks beer, wine and hard liquor; only occasionally per week    Medication History Warfarin Sodium (5MG  Tablet, Oral) Active. Allopurinol (300MG  Tablet, Oral) Active.    Past Surgical  History Vasectomy Total Knee Replacement. left Arthroscopy of Shoulder. right Arthroscopy of Knee. right Tonsillectomy  Other Orthopaedic Surgery   Review of Systems General:Not Present- Chills, Fever, Night Sweats, Fatigue, Weight Gain, Weight Loss and Memory Loss. Skin:Not Present- Hives, Itching, Rash, Eczema and Lesions. HEENT:Not Present- Tinnitus, Headache, Double Vision, Visual Loss, Hearing Loss and Dentures. Respiratory:Present- Shortness of breath with exertion. Not Present- Shortness of breath at rest, Allergies, Coughing up blood and Chronic Cough. Cardiovascular:Present- Difficulty Breathing Lying Down. Not Present- Chest Pain, Racing/skipping heartbeats, Murmur, Swelling and Palpitations. Gastrointestinal:Present- Heartburn. Not Present- Bloody Stool, Abdominal Pain, Vomiting, Nausea, Constipation, Diarrhea, Difficulty Swallowing, Jaundice and Loss of appetitie. Male Genitourinary:Not Present- Urinary frequency, Blood in Urine, Weak urinary stream, Discharge, Flank Pain, Incontinence, Painful Urination, Urgency, Urinary Retention and Urinating at Night. Musculoskeletal:Present- Joint Swelling and Joint Pain. Not Present- Muscle Weakness, Muscle Pain, Back Pain, Morning Stiffness and Spasms. Neurological:Not Present- Tremor, Dizziness, Blackout spells, Paralysis, Difficulty with balance and Weakness. Psychiatric:Not Present- Insomnia.    Vitals Weight: 230 lb Height: 71 in Weight was reported by patient. Height was reported by patient. Body Surface Area: 2.29 m Body Mass Index: 32.08 kg/m Pulse: 84 (Regular) Resp.: 16 (Unlabored) BP: 118/68 (Sitting, Right Arm, Standard)     Physical Exam The physical exam findings are as follows:   General Mental Status - Alert, cooperative and good historian. General Appearance- pleasant. Not in acute distress. Orientation- Oriented X3. Build & Nutrition- Well nourished and Well  developed.   Head and Neck Head- normocephalic, atraumatic . Neck Global Assessment- supple. no bruit auscultated on the right and no bruit auscultated on the left.   Eye Vision- Wears corrective lenses. Pupil- Bilateral- Regular and Round. Motion- Bilateral- EOMI.   Chest and Lung Exam Auscultation: Breath sounds:- clear at anterior chest wall and - clear at posterior chest wall. Adventitious sounds:- No Adventitious sounds.   Cardiovascular Auscultation:Rhythm- Regular rate and rhythm. Heart Sounds- S1 WNL and S2 WNL. Murmurs & Other Heart Sounds:Auscultation of the heart reveals - No Murmurs.   Abdomen Palpation/Percussion:Tenderness- Abdomen is non-tender to palpation. Rigidity (guarding)- Abdomen is soft. Auscultation:Auscultation of the abdomen reveals - Bowel sounds normal.   Male Genitourinary Not done, not pertinent to present illness  Musculoskeletal On exam, well developed male alert and oriented in no apparent distress. Evaluation of his right knee shows no effusion. His range of motion is about 5-125. He does have a slight bit of varus/valgus plane in the knee but no gross laxity. There is moderate crepitus on range of motion. He is tender medial greater than lateral with no instability. Pulse, sensation, and motor intact distally.  RADIOGRAPHS: AP both knees and lateral show that the prosthesis on the left is in excellent position with no periprosthetic abnormalities. On the right, he has bone on bone arthritis patellofemoral compartment and significant squaring of the medial femoral condyle with minimal space left on the AP but no space left on the lateral.   Assessment & Plan Primary osteoarthritis of one knee (715.16) Impression: Right Knee  Note: Plan is for a Right Total Knee Replacement by Dr. Wynelle Link.  Plan is to go home.  PCP - Dr. Harlan Stains - Patient has been seen preoperatively and felt to be stable for  surgery.  The patient will not receive TXA (tranexamic acid) due to: History of Blood Clot, Pulmonary Embolus.  Please note that the patient is on chronic coumadin and wills top five days prior to surgery on 01/12/2014. He will be provided a Lovenox Bridge to take for days 3/12 thru 3/15 daily prior to surgery. He will  then be place back onto coumadin and Lovenox bridging following the procedure.  Signed electronically by Joelene Millin, III PA-C

## 2014-01-18 ENCOUNTER — Inpatient Hospital Stay (HOSPITAL_COMMUNITY)
Admission: RE | Admit: 2014-01-18 | Discharge: 2014-01-20 | DRG: 470 | Disposition: A | Discharge status: Home Health | Payer: Medicare Other | Source: Ambulatory Visit | Attending: Orthopedic Surgery | Admitting: Orthopedic Surgery

## 2014-01-18 ENCOUNTER — Encounter (HOSPITAL_COMMUNITY): Payer: Self-pay | Admitting: *Deleted

## 2014-01-18 ENCOUNTER — Inpatient Hospital Stay (HOSPITAL_COMMUNITY): Payer: Medicare Other | Admitting: Anesthesiology

## 2014-01-18 ENCOUNTER — Encounter (HOSPITAL_COMMUNITY)
Admission: RE | Disposition: A | Discharge status: Home Health | Payer: Self-pay | Source: Ambulatory Visit | Attending: Orthopedic Surgery

## 2014-01-18 ENCOUNTER — Encounter (HOSPITAL_COMMUNITY): Payer: Medicare Other | Admitting: Anesthesiology

## 2014-01-18 DIAGNOSIS — Z96651 Presence of right artificial knee joint: Secondary | ICD-10-CM

## 2014-01-18 DIAGNOSIS — F411 Generalized anxiety disorder: Secondary | ICD-10-CM | POA: Diagnosis present

## 2014-01-18 DIAGNOSIS — Z6831 Body mass index (BMI) 31.0-31.9, adult: Secondary | ICD-10-CM

## 2014-01-18 DIAGNOSIS — M109 Gout, unspecified: Secondary | ICD-10-CM | POA: Diagnosis present

## 2014-01-18 DIAGNOSIS — D62 Acute posthemorrhagic anemia: Secondary | ICD-10-CM | POA: Diagnosis not present

## 2014-01-18 DIAGNOSIS — G4733 Obstructive sleep apnea (adult) (pediatric): Secondary | ICD-10-CM | POA: Diagnosis present

## 2014-01-18 DIAGNOSIS — Z823 Family history of stroke: Secondary | ICD-10-CM

## 2014-01-18 DIAGNOSIS — Z85828 Personal history of other malignant neoplasm of skin: Secondary | ICD-10-CM

## 2014-01-18 DIAGNOSIS — I2782 Chronic pulmonary embolism: Secondary | ICD-10-CM | POA: Diagnosis present

## 2014-01-18 DIAGNOSIS — J45909 Unspecified asthma, uncomplicated: Secondary | ICD-10-CM | POA: Diagnosis present

## 2014-01-18 DIAGNOSIS — M179 Osteoarthritis of knee, unspecified: Secondary | ICD-10-CM | POA: Diagnosis present

## 2014-01-18 DIAGNOSIS — H9190 Unspecified hearing loss, unspecified ear: Secondary | ICD-10-CM | POA: Diagnosis present

## 2014-01-18 DIAGNOSIS — Z79899 Other long term (current) drug therapy: Secondary | ICD-10-CM

## 2014-01-18 DIAGNOSIS — Z7901 Long term (current) use of anticoagulants: Secondary | ICD-10-CM

## 2014-01-18 DIAGNOSIS — I82509 Chronic embolism and thrombosis of unspecified deep veins of unspecified lower extremity: Secondary | ICD-10-CM | POA: Diagnosis present

## 2014-01-18 DIAGNOSIS — M1710 Unilateral primary osteoarthritis, unspecified knee: Principal | ICD-10-CM | POA: Diagnosis present

## 2014-01-18 DIAGNOSIS — Z96659 Presence of unspecified artificial knee joint: Secondary | ICD-10-CM

## 2014-01-18 DIAGNOSIS — K219 Gastro-esophageal reflux disease without esophagitis: Secondary | ICD-10-CM | POA: Diagnosis present

## 2014-01-18 HISTORY — PX: TOTAL KNEE ARTHROPLASTY: SHX125

## 2014-01-18 LAB — TYPE AND SCREEN
ABO/RH(D): A POS
Antibody Screen: NEGATIVE

## 2014-01-18 LAB — PROTIME-INR
INR: 1.09 (ref 0.00–1.49)
PROTHROMBIN TIME: 13.9 s (ref 11.6–15.2)

## 2014-01-18 SURGERY — ARTHROPLASTY, KNEE, TOTAL
Anesthesia: General | Site: Knee | Laterality: Right

## 2014-01-18 MED ORDER — ALLOPURINOL 300 MG PO TABS
300.0000 mg | ORAL_TABLET | Freq: Every day | ORAL | Status: DC
  Administered 2014-01-19 – 2014-01-20 (×2): 300 mg via ORAL
  Filled 2014-01-18 (×2): qty 1

## 2014-01-18 MED ORDER — ALBUTEROL SULFATE HFA 108 (90 BASE) MCG/ACT IN AERS: 2.0000 | INHALATION_SPRAY | Freq: Four times a day (QID) | RESPIRATORY_TRACT | Status: DC | PRN

## 2014-01-18 MED ORDER — MENTHOL 3 MG MT LOZG: 1.0000 | LOZENGE | OROMUCOSAL | Status: DC | PRN

## 2014-01-18 MED ORDER — CISATRACURIUM BESYLATE (PF) 10 MG/5ML IV SOLN
INTRAVENOUS | Status: DC | PRN
  Administered 2014-01-18: 6 mg via INTRAVENOUS

## 2014-01-18 MED ORDER — METHOCARBAMOL 100 MG/ML IJ SOLN
500.0000 mg | Freq: Four times a day (QID) | INTRAVENOUS | Status: DC | PRN
  Administered 2014-01-18: 500 mg via INTRAVENOUS
  Filled 2014-01-18: qty 5

## 2014-01-18 MED ORDER — DEXAMETHASONE SODIUM PHOSPHATE 10 MG/ML IJ SOLN: 10.0000 mg | Freq: Once | INTRAMUSCULAR | Status: DC

## 2014-01-18 MED ORDER — HYDROMORPHONE HCL PF 1 MG/ML IJ SOLN
0.5000 mg | INTRAMUSCULAR | Status: DC | PRN
  Administered 2014-01-18: 1 mg via INTRAVENOUS
  Filled 2014-01-18: qty 1

## 2014-01-18 MED ORDER — METHOCARBAMOL 500 MG PO TABS
500.0000 mg | ORAL_TABLET | Freq: Four times a day (QID) | ORAL | Status: DC | PRN
  Administered 2014-01-18 – 2014-01-20 (×4): 500 mg via ORAL
  Filled 2014-01-18 (×4): qty 1

## 2014-01-18 MED ORDER — HYDROMORPHONE HCL PF 1 MG/ML IJ SOLN
INTRAMUSCULAR | Status: AC
  Filled 2014-01-18: qty 1

## 2014-01-18 MED ORDER — PROPOFOL 10 MG/ML IV BOLUS
INTRAVENOUS | Status: DC | PRN
  Administered 2014-01-18: 200 mg via INTRAVENOUS

## 2014-01-18 MED ORDER — SUCCINYLCHOLINE CHLORIDE 20 MG/ML IJ SOLN
INTRAMUSCULAR | Status: DC | PRN
  Administered 2014-01-18: 100 mg via INTRAVENOUS

## 2014-01-18 MED ORDER — SUFENTANIL CITRATE 50 MCG/ML IV SOLN
INTRAVENOUS | Status: AC
  Filled 2014-01-18: qty 1

## 2014-01-18 MED ORDER — ESCITALOPRAM OXALATE 20 MG PO TABS: 20.0000 mg | ORAL_TABLET | ORAL | Status: DC

## 2014-01-18 MED ORDER — EPHEDRINE SULFATE 50 MG/ML IJ SOLN
INTRAMUSCULAR | Status: AC
  Filled 2014-01-18: qty 1

## 2014-01-18 MED ORDER — SODIUM CHLORIDE 0.9 % IJ SOLN
INTRAMUSCULAR | Status: AC
  Filled 2014-01-18: qty 10

## 2014-01-18 MED ORDER — METOCLOPRAMIDE HCL 5 MG/ML IJ SOLN: 5.0000 mg | Freq: Three times a day (TID) | INTRAMUSCULAR | Status: DC | PRN

## 2014-01-18 MED ORDER — IPRATROPIUM-ALBUTEROL 0.5-2.5 (3) MG/3ML IN SOLN: 3.0000 mL | RESPIRATORY_TRACT | Status: DC | PRN

## 2014-01-18 MED ORDER — ACETAMINOPHEN 650 MG RE SUPP: 650.0000 mg | Freq: Four times a day (QID) | RECTAL | Status: DC | PRN

## 2014-01-18 MED ORDER — SUFENTANIL CITRATE 50 MCG/ML IV SOLN
INTRAVENOUS | Status: DC | PRN
  Administered 2014-01-18: 20 ug via INTRAVENOUS
  Administered 2014-01-18 (×3): 10 ug via INTRAVENOUS

## 2014-01-18 MED ORDER — BUPIVACAINE HCL 0.25 % IJ SOLN
INTRAMUSCULAR | Status: DC | PRN
  Administered 2014-01-18: 20 mL

## 2014-01-18 MED ORDER — ENOXAPARIN SODIUM 30 MG/0.3ML ~~LOC~~ SOLN
30.0000 mg | Freq: Two times a day (BID) | SUBCUTANEOUS | Status: DC
  Administered 2014-01-19 – 2014-01-20 (×3): 30 mg via SUBCUTANEOUS
  Filled 2014-01-18 (×5): qty 0.3

## 2014-01-18 MED ORDER — ONDANSETRON HCL 4 MG/2ML IJ SOLN: 4.0000 mg | Freq: Four times a day (QID) | INTRAMUSCULAR | Status: DC | PRN

## 2014-01-18 MED ORDER — EPHEDRINE SULFATE 50 MG/ML IJ SOLN
INTRAMUSCULAR | Status: DC | PRN
  Administered 2014-01-18: 10 mg via INTRAVENOUS

## 2014-01-18 MED ORDER — BUPIVACAINE LIPOSOME 1.3 % IJ SUSP
INTRAMUSCULAR | Status: DC | PRN
  Administered 2014-01-18: 20 mL

## 2014-01-18 MED ORDER — STERILE WATER FOR IRRIGATION IR SOLN
Status: DC | PRN
  Administered 2014-01-18: 1500 mL

## 2014-01-18 MED ORDER — SODIUM CHLORIDE 0.9 % IJ SOLN
INTRAMUSCULAR | Status: AC
  Filled 2014-01-18: qty 50

## 2014-01-18 MED ORDER — ONDANSETRON HCL 4 MG/2ML IJ SOLN
INTRAMUSCULAR | Status: DC | PRN
  Administered 2014-01-18: 4 mg via INTRAVENOUS

## 2014-01-18 MED ORDER — BUPIVACAINE HCL (PF) 0.25 % IJ SOLN
INTRAMUSCULAR | Status: AC
  Filled 2014-01-18: qty 30

## 2014-01-18 MED ORDER — METOCLOPRAMIDE HCL 10 MG PO TABS: 5.0000 mg | ORAL_TABLET | Freq: Three times a day (TID) | ORAL | Status: DC | PRN

## 2014-01-18 MED ORDER — PROMETHAZINE HCL 25 MG/ML IJ SOLN: 6.2500 mg | INTRAMUSCULAR | Status: DC | PRN

## 2014-01-18 MED ORDER — ACETAMINOPHEN 325 MG PO TABS: 650.0000 mg | ORAL_TABLET | Freq: Four times a day (QID) | ORAL | Status: DC | PRN

## 2014-01-18 MED ORDER — CISATRACURIUM BESYLATE 20 MG/10ML IV SOLN
INTRAVENOUS | Status: AC
  Filled 2014-01-18: qty 10

## 2014-01-18 MED ORDER — HYDROMORPHONE HCL PF 2 MG/ML IJ SOLN
INTRAMUSCULAR | Status: AC
  Filled 2014-01-18: qty 1

## 2014-01-18 MED ORDER — CEFAZOLIN SODIUM-DEXTROSE 2-3 GM-% IV SOLR
2.0000 g | Freq: Four times a day (QID) | INTRAVENOUS | Status: AC
  Administered 2014-01-18 (×2): 2 g via INTRAVENOUS
  Filled 2014-01-18 (×2): qty 50

## 2014-01-18 MED ORDER — 0.9 % SODIUM CHLORIDE (POUR BTL) OPTIME
TOPICAL | Status: DC | PRN
  Administered 2014-01-18: 1000 mL

## 2014-01-18 MED ORDER — DIPHENHYDRAMINE HCL 12.5 MG/5ML PO ELIX: 12.5000 mg | ORAL_SOLUTION | ORAL | Status: DC | PRN

## 2014-01-18 MED ORDER — DEXAMETHASONE 6 MG PO TABS
10.0000 mg | ORAL_TABLET | Freq: Every day | ORAL | Status: AC
  Administered 2014-01-19: 10 mg via ORAL
  Filled 2014-01-18: qty 1

## 2014-01-18 MED ORDER — SODIUM CHLORIDE 0.9 % IJ SOLN
INTRAMUSCULAR | Status: DC | PRN
  Administered 2014-01-18: 30 mL

## 2014-01-18 MED ORDER — KETAMINE HCL 10 MG/ML IJ SOLN
INTRAMUSCULAR | Status: AC
  Filled 2014-01-18: qty 1

## 2014-01-18 MED ORDER — LACTATED RINGERS IV SOLN
INTRAVENOUS | Status: DC
  Administered 2014-01-18: 1000 mL via INTRAVENOUS
  Administered 2014-01-18: 13:00:00 via INTRAVENOUS

## 2014-01-18 MED ORDER — WARFARIN SODIUM 7.5 MG PO TABS
7.5000 mg | ORAL_TABLET | Freq: Once | ORAL | Status: AC
  Administered 2014-01-18: 7.5 mg via ORAL
  Filled 2014-01-18: qty 1

## 2014-01-18 MED ORDER — HYDROMORPHONE HCL PF 1 MG/ML IJ SOLN
0.2500 mg | INTRAMUSCULAR | Status: DC | PRN
  Administered 2014-01-18: 0.5 mg via INTRAVENOUS

## 2014-01-18 MED ORDER — ONDANSETRON HCL 4 MG PO TABS: 4.0000 mg | ORAL_TABLET | Freq: Four times a day (QID) | ORAL | Status: DC | PRN

## 2014-01-18 MED ORDER — OXYCODONE HCL 5 MG PO TABS: 5.0000 mg | ORAL_TABLET | ORAL | Status: DC | PRN

## 2014-01-18 MED ORDER — BISACODYL 10 MG RE SUPP: 10.0000 mg | Freq: Every day | RECTAL | Status: DC | PRN

## 2014-01-18 MED ORDER — DEXAMETHASONE SODIUM PHOSPHATE 10 MG/ML IJ SOLN
INTRAMUSCULAR | Status: AC
  Filled 2014-01-18: qty 1

## 2014-01-18 MED ORDER — SODIUM CHLORIDE 0.9 % IV SOLN
INTRAVENOUS | Status: DC
Start: 2014-01-18 — End: 2014-01-18

## 2014-01-18 MED ORDER — NEOSTIGMINE METHYLSULFATE 1 MG/ML IJ SOLN
INTRAMUSCULAR | Status: AC
  Filled 2014-01-18: qty 10

## 2014-01-18 MED ORDER — DEXTROSE-NACL 5-0.45 % IV SOLN
INTRAVENOUS | Status: DC
  Administered 2014-01-18: 18:00:00 via INTRAVENOUS

## 2014-01-18 MED ORDER — BUPIVACAINE LIPOSOME 1.3 % IJ SUSP
20.0000 mL | Freq: Once | INTRAMUSCULAR | Status: DC
  Filled 2014-01-18: qty 20

## 2014-01-18 MED ORDER — DEXAMETHASONE SODIUM PHOSPHATE 10 MG/ML IJ SOLN
INTRAMUSCULAR | Status: DC | PRN
  Administered 2014-01-18: 10 mg via INTRAVENOUS

## 2014-01-18 MED ORDER — ALBUTEROL SULFATE (2.5 MG/3ML) 0.083% IN NEBU: 2.5000 mg | INHALATION_SOLUTION | Freq: Four times a day (QID) | RESPIRATORY_TRACT | Status: DC | PRN

## 2014-01-18 MED ORDER — PROPOFOL 10 MG/ML IV BOLUS
INTRAVENOUS | Status: AC
  Filled 2014-01-18: qty 20

## 2014-01-18 MED ORDER — KETAMINE HCL 10 MG/ML IJ SOLN
INTRAMUSCULAR | Status: DC | PRN
  Administered 2014-01-18: 30 mg via INTRAVENOUS
  Administered 2014-01-18 (×2): 10 mg via INTRAVENOUS

## 2014-01-18 MED ORDER — IPRATROPIUM-ALBUTEROL 18-103 MCG/ACT IN AERO: 2.0000 | INHALATION_SPRAY | RESPIRATORY_TRACT | Status: DC | PRN

## 2014-01-18 MED ORDER — POLYETHYLENE GLYCOL 3350 17 G PO PACK: 17.0000 g | PACK | Freq: Every day | ORAL | Status: DC | PRN

## 2014-01-18 MED ORDER — KETOROLAC TROMETHAMINE 15 MG/ML IJ SOLN
7.5000 mg | Freq: Four times a day (QID) | INTRAMUSCULAR | Status: AC | PRN
  Administered 2014-01-18: 7.5 mg via INTRAVENOUS

## 2014-01-18 MED ORDER — WARFARIN - PHARMACIST DOSING INPATIENT: Freq: Every day | Status: DC

## 2014-01-18 MED ORDER — FLEET ENEMA 7-19 GM/118ML RE ENEM: 1.0000 | ENEMA | Freq: Once | RECTAL | Status: AC | PRN

## 2014-01-18 MED ORDER — CEFAZOLIN SODIUM-DEXTROSE 2-3 GM-% IV SOLR
INTRAVENOUS | Status: AC
  Filled 2014-01-18: qty 50

## 2014-01-18 MED ORDER — ONDANSETRON HCL 4 MG/2ML IJ SOLN
INTRAMUSCULAR | Status: AC
  Filled 2014-01-18: qty 2

## 2014-01-18 MED ORDER — CEFAZOLIN SODIUM-DEXTROSE 2-3 GM-% IV SOLR
2.0000 g | INTRAVENOUS | Status: AC
Start: 2014-01-18 — End: 2014-01-18
  Administered 2014-01-18: 2 g via INTRAVENOUS

## 2014-01-18 MED ORDER — LIDOCAINE HCL (CARDIAC) 20 MG/ML IV SOLN
INTRAVENOUS | Status: DC | PRN
  Administered 2014-01-18: 100 mg via INTRAVENOUS

## 2014-01-18 MED ORDER — DEXAMETHASONE SODIUM PHOSPHATE 10 MG/ML IJ SOLN
10.0000 mg | Freq: Every day | INTRAMUSCULAR | Status: AC
  Filled 2014-01-18: qty 1

## 2014-01-18 MED ORDER — ACETAMINOPHEN 10 MG/ML IV SOLN
1000.0000 mg | Freq: Once | INTRAVENOUS | Status: AC
  Administered 2014-01-18: 1000 mg via INTRAVENOUS
  Filled 2014-01-18: qty 100

## 2014-01-18 MED ORDER — SODIUM CHLORIDE 0.9 % IR SOLN
Status: DC | PRN
  Administered 2014-01-18: 1000 mL

## 2014-01-18 MED ORDER — KETOROLAC TROMETHAMINE 15 MG/ML IJ SOLN
INTRAMUSCULAR | Status: AC
  Filled 2014-01-18: qty 1

## 2014-01-18 MED ORDER — PHENOL 1.4 % MT LIQD: 1.0000 | OROMUCOSAL | Status: DC | PRN

## 2014-01-18 MED ORDER — GLYCOPYRROLATE 0.2 MG/ML IJ SOLN
INTRAMUSCULAR | Status: AC
  Filled 2014-01-18: qty 2

## 2014-01-18 MED ORDER — DOCUSATE SODIUM 100 MG PO CAPS
100.0000 mg | ORAL_CAPSULE | Freq: Two times a day (BID) | ORAL | Status: DC
  Administered 2014-01-18 – 2014-01-20 (×4): 100 mg via ORAL

## 2014-01-18 MED ORDER — NEOSTIGMINE METHYLSULFATE 1 MG/ML IJ SOLN
INTRAMUSCULAR | Status: DC | PRN
  Administered 2014-01-18: 3 mg via INTRAVENOUS

## 2014-01-18 MED ORDER — GLYCOPYRROLATE 0.2 MG/ML IJ SOLN
INTRAMUSCULAR | Status: DC | PRN
  Administered 2014-01-18: .4 mg via INTRAVENOUS

## 2014-01-18 MED ORDER — ACETAMINOPHEN 500 MG PO TABS
1000.0000 mg | ORAL_TABLET | Freq: Four times a day (QID) | ORAL | Status: AC
  Administered 2014-01-18 – 2014-01-19 (×4): 1000 mg via ORAL
  Filled 2014-01-18 (×5): qty 2

## 2014-01-18 SURGICAL SUPPLY — 57 items
BAG SPEC THK2 15X12 ZIP CLS (MISCELLANEOUS) ×1
BAG ZIPLOCK 12X15 (MISCELLANEOUS) ×2 IMPLANT
BANDAGE ELASTIC 6 VELCRO ST LF (GAUZE/BANDAGES/DRESSINGS) ×2 IMPLANT
BANDAGE ESMARK 6X9 LF (GAUZE/BANDAGES/DRESSINGS) ×1 IMPLANT
BLADE SAG 18X100X1.27 (BLADE) ×2 IMPLANT
BLADE SAW SGTL 11.0X1.19X90.0M (BLADE) ×2 IMPLANT
BNDG CMPR 9X6 STRL LF SNTH (GAUZE/BANDAGES/DRESSINGS) ×1
BNDG ESMARK 6X9 LF (GAUZE/BANDAGES/DRESSINGS) ×2
BOWL SMART MIX CTS (DISPOSABLE) ×2 IMPLANT
CAPT RP KNEE ×1 IMPLANT
CEMENT HV SMART SET (Cement) ×4 IMPLANT
CUFF TOURN SGL QUICK 34 (TOURNIQUET CUFF) ×2
CUFF TRNQT CYL 34X4X40X1 (TOURNIQUET CUFF) ×1 IMPLANT
DECANTER SPIKE VIAL GLASS SM (MISCELLANEOUS) ×2 IMPLANT
DRAPE EXTREMITY T 121X128X90 (DRAPE) ×2 IMPLANT
DRAPE POUCH INSTRU U-SHP 10X18 (DRAPES) ×2 IMPLANT
DRAPE U-SHAPE 47X51 STRL (DRAPES) ×2 IMPLANT
DRSG ADAPTIC 3X8 NADH LF (GAUZE/BANDAGES/DRESSINGS) ×2 IMPLANT
DURAPREP 26ML APPLICATOR (WOUND CARE) ×2 IMPLANT
ELECT REM PT RETURN 9FT ADLT (ELECTROSURGICAL) ×2
ELECTRODE REM PT RTRN 9FT ADLT (ELECTROSURGICAL) ×1 IMPLANT
EVACUATOR 1/8 PVC DRAIN (DRAIN) ×2 IMPLANT
FACESHIELD LNG OPTICON STERILE (SAFETY) ×10 IMPLANT
GLOVE BIO SURGEON STRL SZ7.5 (GLOVE) IMPLANT
GLOVE BIO SURGEON STRL SZ8 (GLOVE) ×2 IMPLANT
GLOVE BIOGEL PI IND STRL 8 (GLOVE) ×2 IMPLANT
GLOVE BIOGEL PI INDICATOR 8 (GLOVE) ×2
GLOVE SURG SS PI 6.5 STRL IVOR (GLOVE) IMPLANT
GOWN STRL REUS W/TWL LRG LVL3 (GOWN DISPOSABLE) ×2 IMPLANT
GOWN STRL REUS W/TWL XL LVL3 (GOWN DISPOSABLE) IMPLANT
HANDPIECE INTERPULSE COAX TIP (DISPOSABLE) ×2
IMMOBILIZER KNEE 20 (SOFTGOODS) ×2 IMPLANT
KIT BASIN OR (CUSTOM PROCEDURE TRAY) ×2 IMPLANT
MANIFOLD NEPTUNE II (INSTRUMENTS) ×2 IMPLANT
NDL SAFETY ECLIPSE 18X1.5 (NEEDLE) ×2 IMPLANT
NEEDLE HYPO 18GX1.5 SHARP (NEEDLE) ×4
NS IRRIG 1000ML POUR BTL (IV SOLUTION) ×2 IMPLANT
PACK TOTAL JOINT (CUSTOM PROCEDURE TRAY) ×2 IMPLANT
PAD ABD 8X10 STRL (GAUZE/BANDAGES/DRESSINGS) ×2 IMPLANT
PADDING CAST COTTON 6X4 STRL (CAST SUPPLIES) ×5 IMPLANT
POSITIONER SURGICAL ARM (MISCELLANEOUS) ×2 IMPLANT
SET HNDPC FAN SPRY TIP SCT (DISPOSABLE) ×1 IMPLANT
SPONGE GAUZE 4X4 12PLY (GAUZE/BANDAGES/DRESSINGS) ×2 IMPLANT
STRIP CLOSURE SKIN 1/2X4 (GAUZE/BANDAGES/DRESSINGS) ×3 IMPLANT
SUCTION FRAZIER 12FR DISP (SUCTIONS) ×2 IMPLANT
SUT MNCRL AB 4-0 PS2 18 (SUTURE) ×2 IMPLANT
SUT VIC AB 2-0 CT1 27 (SUTURE) ×6
SUT VIC AB 2-0 CT1 TAPERPNT 27 (SUTURE) ×3 IMPLANT
SUT VLOC 180 0 24IN GS25 (SUTURE) ×2 IMPLANT
SYR 20CC LL (SYRINGE) ×2 IMPLANT
SYR 50ML LL SCALE MARK (SYRINGE) ×2 IMPLANT
TOWEL OR 17X26 10 PK STRL BLUE (TOWEL DISPOSABLE) ×2 IMPLANT
TOWEL OR NON WOVEN STRL DISP B (DISPOSABLE) IMPLANT
TRAY FOLEY CATH 14FRSI W/METER (CATHETERS) ×1 IMPLANT
TRAY FOLEY CATH 16FR SILVER (SET/KITS/TRAYS/PACK) ×1 IMPLANT
WATER STERILE IRR 1500ML POUR (IV SOLUTION) ×2 IMPLANT
WRAP KNEE MAXI GEL POST OP (GAUZE/BANDAGES/DRESSINGS) ×2 IMPLANT

## 2014-01-18 NOTE — Progress Notes (Signed)
   CARE MANAGEMENT NOTE 01/18/2014  Patient:  ROYCE, STEGMAN   Account Number:  [id number]  Date Initiated:  01/18/2014  Documentation initiated by:  [facility]  Subjective/Objective Assessment:   RIGHT TOTAL KNEE ARTHROPLASTY     Action/Plan:   [place]   Anticipated DC Date:  01/20/2014   Anticipated DC Plan:  [place]  CM consult      [facility] Choice  HOME HEALTH   Choice offered to / List presented to:  C-1 Patient   DME arranged  Vassie Moselle      DME agency  [place] arranged  HH-2 PT      [place]   Status of service:  Completed, signed off Medicare Important Message given?   (If response is "NO", the following Medicare IM given date fields will be blank) Date Medicare IM given:   Date Additional Medicare IM given:    Discharge Disposition:  [place]  Per UR Regulation:    If discussed at Long Length of Stay Meetings, dates discussed:    Comments:  01/18/2014 1715 NCM spoke to pt and offered choice for [facility]. Pt agreeable to [facility] for [facility]. Requested RW for home. Contacted [facility] for DME for home. Jonnie Finner RN CCM Case Mgmt phone [phone]

## 2014-01-18 NOTE — Anesthesia Preprocedure Evaluation (Addendum)
Anesthesia Evaluation  Patient identified by MRN, date of birth, ID band Patient awake    Reviewed: Allergy & Precautions, H&P , NPO status , Patient's Chart, lab work & pertinent test results  Airway Mallampati: II TM Distance: >3 FB Neck ROM: Full    Dental no notable dental hx.    Pulmonary asthma , sleep apnea , former smoker,  breath sounds clear to auscultation  Pulmonary exam normal       Cardiovascular negative cardio ROS  Rhythm:Regular Rate:Normal     Neuro/Psych Anxiety negative neurological ROS     GI/Hepatic Neg liver ROS, GERD-  Medicated,  Endo/Other  negative endocrine ROS  Renal/GU negative Renal ROS  negative genitourinary   Musculoskeletal negative musculoskeletal ROS (+)   Abdominal   Peds negative pediatric ROS (+)  Hematology negative hematology ROS (+)   Anesthesia Other Findings   Reproductive/Obstetrics negative OB ROS                          Anesthesia Physical Anesthesia Plan  ASA: II  Anesthesia Plan: General   Post-op Pain Management:    Induction: Intravenous  Airway Management Planned: Oral ETT  Additional Equipment:   Intra-op Plan:   Post-operative Plan: Extubation in OR  Informed Consent: I have reviewed the patients History and Physical, chart, labs and discussed the procedure including the risks, benefits and alternatives for the proposed anesthesia with the patient or authorized representative who has indicated his/her understanding and acceptance.   Dental advisory given  Plan Discussed with: CRNA  Anesthesia Plan Comments: (Discussed r/b general versus spinal. Recently on lovenox and coumadin. Last lovenox yesterday at 1200. One kidney functioning with decreased GFR. Plan general.)       Anesthesia Quick Evaluation

## 2014-01-18 NOTE — Interval H&P Note (Signed)
History and Physical Interval Note:  01/18/2014 10:57 AM  [name]  has presented today for surgery, with the diagnosis of OA OF RIGHT KNEE  The various methods of treatment have been discussed with the patient and family. After consideration of risks, benefits and other options for treatment, the patient has consented to  Procedure(s): RIGHT TOTAL KNEE ARTHROPLASTY (Right) as a surgical intervention .  The patient's history has been reviewed, patient examined, no change in status, stable for surgery.  I have reviewed the patient's chart and labs.  Questions were answered to the patient's satisfaction.     Gearlean Alf

## 2014-01-18 NOTE — Op Note (Signed)
Pre-operative diagnosis- Osteoarthritis  Right knee(s)  Post-operative diagnosis- Osteoarthritis Right knee(s)  Procedure-  Right  Total Knee Arthroplasty  Surgeon- Dione Plover. Tierria Watson, MD  Assistant- Arlee Muslim, PA-C   Anesthesia-  General EBL-* No blood loss amount entered *  Drains Hemovac  Tourniquet time-  Total Tourniquet Time Documented: Thigh (Right) - 37 minutes Total: Thigh (Right) - 37 minutes    Complications- None  Condition-PACU - hemodynamically stable.   Brief Clinical Note  Joseph Joseph is a 73 y.o. year old male with end stage OA of his right knee with progressively worsening pain and dysfunction. He has constant pain, with activity and at rest and significant functional deficits with difficulties even with ADLs. He has had extensive non-op management including analgesics, injections of cortisone, and home exercise program, but remains in significant pain with significant dysfunction. Radiographs show bone on bone arthritis medial and patellofemoral. He presents now for right Total Knee Arthroplasty.    Procedure in detail---   The patient is brought into the operating room and positioned supine on the operating table. After successful administration of  General,   a tourniquet is placed high on the  Right thigh(s) and the lower extremity is prepped and draped in the usual sterile fashion. Time out is performed by the operating team and then the  Right lower extremity is wrapped in Esmarch, knee flexed and the tourniquet inflated to 300 mmHg.       A midline incision is made with a ten blade through the subcutaneous tissue to the level of the extensor mechanism. A fresh blade is used to make a medial parapatellar arthrotomy. Soft tissue over the proximal medial tibia is subperiosteally elevated to the joint line with a knife and into the semimembranosus bursa with a Cobb elevator. Soft tissue over the proximal lateral tibia is elevated with attention being paid to  avoiding the patellar tendon on the tibial tubercle. The patella is everted, knee flexed 90 degrees and the ACL and PCL are removed. Findings are bone on bone medial and patellofemoral with large medial osteophytes.        The drill is used to create a starting hole in the distal femur and the canal is thoroughly irrigated with sterile saline to remove the fatty contents. The 5 degree Right  valgus alignment guide is placed into the femoral canal and the distal femoral cutting block is pinned to remove 11 mm off the distal femur. Resection is made with an oscillating saw.      The tibia is subluxed forward and the menisci are removed. The extramedullary alignment guide is placed referencing proximally at the medial aspect of the tibial tubercle and distally along the second metatarsal axis and tibial crest. The block is pinned to remove 56mm off the more deficient medial  side. Resection is made with an oscillating saw. Size 5is the most appropriate size for the tibia and the proximal tibia is prepared with the modular drill and keel punch for that size.      The femoral sizing guide is placed and size 4 is most appropriate. Rotation is marked off the epicondylar axis and confirmed by creating a rectangular flexion gap at 90 degrees. The size 4 cutting block is pinned in this rotation and the anterior, posterior and chamfer cuts are made with the oscillating saw. The intercondylar block is then placed and that cut is made.      Trial size 5 tibial component, trial size 4 posterior stabilized  femur and a 12.5  mm posterior stabilized rotating platform insert trial is placed. Full extension is achieved with excellent varus/valgus and anterior/posterior balance throughout full range of motion. The patella is everted and thickness measured to be 27  mm. Free hand resection is taken to 15 mm, a 41 template is placed, lug holes are drilled, trial patella is placed, and it tracks normally. Osteophytes are removed off  the posterior femur with the trial in place. All trials are removed and the cut bone surfaces prepared with pulsatile lavage. Cement is mixed and once ready for implantation, the size 5 tibial implant, size  4 posterior stabilized femoral component, and the size 41 patella are cemented in place and the patella is held with the clamp. The trial insert is placed and the knee held in full extension. The Exparel (20 ml mixed with 30 ml saline) and .25% Bupivicaine, are injected into the extensor mechanism, posterior capsule, medial and lateral gutters and subcutaneous tissues.  All extruded cement is removed and once the cement is hard the permanent 12.5 mm posterior stabilized rotating platform insert is placed into the tibial tray.      The wound is copiously irrigated with saline solution and the extensor mechanism closed over a hemovac drain with #1 PDS suture. The tourniquet is released for a total tourniquet time of 36  minutes. Flexion against gravity is 140 degrees and the patella tracks normally. Subcutaneous tissue is closed with 2.0 vicryl and subcuticular with running 4.0 Monocryl. The incision is cleaned and dried and steri-strips and a bulky sterile dressing are applied. The limb is placed into a knee immobilizer and the patient is awakened and transported to recovery in stable condition.      Please note that a surgical assistant was a medical necessity for this procedure in order to perform it in a safe and expeditious manner. Surgical assistant was necessary to retract the ligaments and vital neurovascular structures to prevent injury to them and also necessary for proper positioning of the limb to allow for anatomic placement of the prosthesis.   Dione Plover Joseph Laidlaw, MD    01/18/2014, 1:10 PM

## 2014-01-18 NOTE — Anesthesia Procedure Notes (Addendum)
Procedure Name: Intubation Date/Time: 01/18/2014 12:06 PM Performed by: Danley Danker L Patient Re-evaluated:Patient Re-evaluated prior to inductionOxygen Delivery Method: Circle system utilized Preoxygenation: Pre-oxygenation with 100% oxygen Intubation Type: IV induction Ventilation: Mask ventilation without difficulty and Oral airway inserted - appropriate to patient size Laryngoscope Size: Miller and 3 Grade View: Grade I Tube type: Oral Tube size: 8.0 mm Number of attempts: 3 Airway Equipment and Method: Stylet Placement Confirmation: ETT inserted through vocal cords under direct vision,  breath sounds checked- equal and bilateral and positive ETCO2 Secured at: 22 cm Tube secured with: Tape Dental Injury: Teeth and Oropharynx as per pre-operative assessment  Comments: 1st 2 attempts performed by EMT student.  3rd attempt by CRNA and charted as above.

## 2014-01-18 NOTE — H&P (View-Only) (Signed)
Alvester Chou P. Music DOB: [birth year] Married / Language: English / Race: White Male  Date of Admission:  01-18-2014  Chief Complaint:  Right Knee Pain  History of Present Illness The patient is a 73 year old male who comes in for a preoperative History and Physical. The patient is scheduled for a right total knee arthroplasty to be performed by Dr. Dione Plover. Aluisio, MD at [facility] on 01-18-2014. The patient is a 73 year old male who presents for follow up of their knee. The patient is being followed for their right knee pain. Symptoms reported include: pain (medial and lateral). The following medication has been used for pain control: Tylenol. The patient comes in today 5 years out from left total knee arthroplasty. The patient states that he is doing well at this time. Note for "Post TKA": Patient states that he has pain in his right knee at times. His left knee has done great. He has no pain at all on the left knee and is very pleased with that. His right knee has gotten progressively worse over time. He originally injured it as a teenager when he fractured his patella. He said he has had problems since. The knee now hurts at all times. It gives out on him frequently. He has had cortisone and viscosupplements in the past which did not help. He is at a stage now where he is ready to go ahead and get the right knee fixed. They have been treated conservatively in the past for the above stated problem and despite conservative measures, they continue to have progressive pain and severe functional limitations and dysfunction. They have failed non-operative management including home exercise, medications. It is felt that they would benefit from undergoing total joint replacement. Risks and benefits of the procedure have been discussed with the patient and they elect to proceed with surgery. There are no active contraindications to surgery such as ongoing infection or rapidly progressive  neurological disease.   Allergies NSAIDs Codeine/Codeine Derivatives Norvasc *CALCIUM CHANNEL BLOCKERS* CeleBREX *ANALGESICS - ANTI-INFLAMMATORY* Viagra *CARDIOVASCULAR AGENTS - MISC.* Cymbalta *ANTIDEPRESSANTS* Flonase *NASAL AGENTS - SYSTEMIC AND TOPICAL* Advair Diskus *ANTIASTHMATIC AND BRONCHODILATOR AGENTS* BuSpar *ANTIANXIETY AGENTS* Lisinopril *ANTIHYPERTENSIVES*. Headaches and Nausea Symbicort *ANTIASTHMATIC AND BRONCHODILATOR AGENTS*  Problem List/Past Medical Primary osteoarthritis of one knee (715.16) Pain, Hip (719.45) Lumbar spine pain (724.2) Chronic kidney disease, stage 3 (585.3) Sleep Apnea Osteoarthritis High blood pressure Skin Cancer Rheumatoid Arthritis Gout Asthma Chronic Pain Impaired Vision Impaired Hearing DVT Pulmonary Embolism Hypogonadism Anxiety Disorder Tubular Adenomatous Polyps   Family History Drug / Alcohol Addiction. First Degree Relatives. sister, brother and child Cancer. mother, father and grandmother fathers side Diabetes Mellitus. father Cerebrovascular Accident. father Osteoarthritis. father and grandmother mothers side Osteoporosis. grandmother mothers side Father. HTN, DM, CVA, DVT's Mother. Deceased. age 38; Colon Cancer    Social History Illicit drug use. no Living situation. live with spouse Tobacco / smoke exposure. yes outdoors only Tobacco use. Former smoker. former smoker; smoke(d) 1 pack(s) per day; uses less than half 1/2 can(s) smokeless per week Marital status. married Pain Contract. no Drug/Alcohol Rehab (Currently). no Drug/Alcohol Rehab (Previously). no Children. 4 Current work status. working part time Exercise. Exercises rarely Alcohol use. current drinker; drinks beer, wine and hard liquor; only occasionally per week    Medication History Warfarin Sodium (5MG  Tablet, Oral) Active. Allopurinol (300MG  Tablet, Oral) Active.    Past Surgical  History Vasectomy Total Knee Replacement. left Arthroscopy of Shoulder. right Arthroscopy of Knee. right Tonsillectomy  Other Orthopaedic Surgery   Review of Systems General:Not Present- Chills, Fever, Night Sweats, Fatigue, Weight Gain, Weight Loss and Memory Loss. Skin:Not Present- Hives, Itching, Rash, Eczema and Lesions. HEENT:Not Present- Tinnitus, Headache, Double Vision, Visual Loss, Hearing Loss and Dentures. Respiratory:Present- Shortness of breath with exertion. Not Present- Shortness of breath at rest, Allergies, Coughing up blood and Chronic Cough. Cardiovascular:Present- Difficulty Breathing Lying Down. Not Present- Chest Pain, Racing/skipping heartbeats, Murmur, Swelling and Palpitations. Gastrointestinal:Present- Heartburn. Not Present- Bloody Stool, Abdominal Pain, Vomiting, Nausea, Constipation, Diarrhea, Difficulty Swallowing, Jaundice and Loss of appetitie. Male Genitourinary:Not Present- Urinary frequency, Blood in Urine, Weak urinary stream, Discharge, Flank Pain, Incontinence, Painful Urination, Urgency, Urinary Retention and Urinating at Night. Musculoskeletal:Present- Joint Swelling and Joint Pain. Not Present- Muscle Weakness, Muscle Pain, Back Pain, Morning Stiffness and Spasms. Neurological:Not Present- Tremor, Dizziness, Blackout spells, Paralysis, Difficulty with balance and Weakness. Psychiatric:Not Present- Insomnia.    Vitals Weight: 230 lb Height: 71 in Weight was reported by patient. Height was reported by patient. Body Surface Area: 2.29 m Body Mass Index: 32.08 kg/m Pulse: 84 (Regular) Resp.: 16 (Unlabored) BP: 118/68 (Sitting, Right Arm, Standard)     Physical Exam The physical exam findings are as follows:   General Mental Status - Alert, cooperative and good historian. General Appearance- pleasant. Not in acute distress. Orientation- Oriented X3. Build & Nutrition- Well nourished and Well  developed.   Head and Neck Head- normocephalic, atraumatic . Neck Global Assessment- supple. no bruit auscultated on the right and no bruit auscultated on the left.   Eye Vision- Wears corrective lenses. Pupil- Bilateral- Regular and Round. Motion- Bilateral- EOMI.   Chest and Lung Exam Auscultation: Breath sounds:- clear at anterior chest wall and - clear at posterior chest wall. Adventitious sounds:- No Adventitious sounds.   Cardiovascular Auscultation:Rhythm- Regular rate and rhythm. Heart Sounds- S1 WNL and S2 WNL. Murmurs & Other Heart Sounds:Auscultation of the heart reveals - No Murmurs.   Abdomen Palpation/Percussion:Tenderness- Abdomen is non-tender to palpation. Rigidity (guarding)- Abdomen is soft. Auscultation:Auscultation of the abdomen reveals - Bowel sounds normal.   Male Genitourinary Not done, not pertinent to present illness  Musculoskeletal On exam, well developed male alert and oriented in no apparent distress. Evaluation of his right knee shows no effusion. His range of motion is about 5-125. He does have a slight bit of varus/valgus plane in the knee but no gross laxity. There is moderate crepitus on range of motion. He is tender medial greater than lateral with no instability. Pulse, sensation, and motor intact distally.  RADIOGRAPHS: AP both knees and lateral show that the prosthesis on the left is in excellent position with no periprosthetic abnormalities. On the right, he has bone on bone arthritis patellofemoral compartment and significant squaring of the medial femoral condyle with minimal space left on the AP but no space left on the lateral.   Assessment & Plan Primary osteoarthritis of one knee (715.16) Impression: Right Knee  Note: Plan is for a Right Total Knee Replacement by Dr. Wynelle Link.  Plan is to go home.  PCP - Dr. Harlan Stains - Patient has been seen preoperatively and felt to be stable for  surgery.  The patient will not receive TXA (tranexamic acid) due to: History of Blood Clot, Pulmonary Embolus.  Please note that the patient is on chronic coumadin and wills top five days prior to surgery on 01/12/2014. He will be provided a Lovenox Bridge to take for days 3/12 thru 3/15 daily prior to surgery. He will  then be place back onto coumadin and Lovenox bridging following the procedure.  Signed electronically by Joelene Millin, III PA-C

## 2014-01-18 NOTE — Preoperative (Signed)
Beta Blockers   Reason not to administer Beta Blockers:Not Applicable 

## 2014-01-18 NOTE — Anesthesia Postprocedure Evaluation (Signed)
  Anesthesia Post-op Note  Patient: [name]  Procedure(s) Performed: Procedure(s) (LRB): RIGHT TOTAL KNEE ARTHROPLASTY (Right)  Patient Location: PACU  Anesthesia Type: General  Level of Consciousness: awake and alert   Airway and Oxygen Therapy: Patient Spontanous Breathing  Post-op Pain: mild  Post-op Assessment: Post-op Vital signs reviewed, Patient's Cardiovascular Status Stable, Respiratory Function Stable, Patent Airway and No signs of Nausea or vomiting  Last Vitals:  Filed Vitals:   01/18/14 1600  BP:   Pulse:   Temp:   Resp: 16    Post-op Vital Signs: stable   Complications: No apparent anesthesia complications

## 2014-01-18 NOTE — Transfer of Care (Signed)
Immediate Anesthesia Transfer of Care Note  Patient: [name]  Procedure(s) Performed: Procedure(s): RIGHT TOTAL KNEE ARTHROPLASTY (Right)  Patient Location: PACU  Anesthesia Type:General  Level of Consciousness: awake and oriented  Airway & Oxygen Therapy: Patient Spontanous Breathing and Patient connected to face mask oxygen  Post-op Assessment: Report given to PACU RN and Post -op Vital signs reviewed and stable  Post vital signs: Reviewed and stable  Complications: No apparent anesthesia complications

## 2014-01-18 NOTE — Progress Notes (Signed)
ANTICOAGULATION CONSULT NOTE - Initial Consult  Pharmacy Consult for Warfarin Indication: VTE prophylaxis  Allergies  Allergen Reactions  . Garlic Anaphylaxis  . Onion Anaphylaxis  . Codeine Other (See Comments)    "PASSES OUT"  . Novocain [Procaine] Nausea Only    Patient Measurements:    Vital Signs: Temp: 98.3 F (36.8 C) (03/16 1453) Temp src: Oral (03/16 0918) BP: 121/77 mmHg (03/16 1453) Pulse Rate: 98 (03/16 1453)  Labs:  Recent Labs  01/18/14 0935  LABPROT 13.9  INR 1.09    The CrCl is unknown because both a height and weight (above a minimum accepted value) are required for this calculation.   Medical History: Past Medical History  Diagnosis Date  . History of DVT (deep vein thrombosis)     LAST ONE LEFT CALF, SUPERFICIAL  2012  . History of pulmonary embolism     X3   LAST ONE 2005  . GERD (gastroesophageal reflux disease)   . S/P dilatation of esophageal stricture   . Lung nodule     BENIGN RIGHT UPPER--  CHEST CT 08-22-2011  . Anticoagulated on warfarin   . Arthritis     KNEES, NECK, SHOULDERS, HANDS  . Gout, arthritis     BILATERAL GREAT TOE-- PER PT STABLE  . Anxiety   . Asthma   . OSA (obstructive sleep apnea)     NO CPAP -states dr said possibly related to asthma  . Cancer     skin cancer-  2 basal cell, 1 squamous cell  . H/O transfusion of packed red blood cells     as a child/ s/p tonsillectomy      Assessment: 40 yoM on chronic warfarin PTA for history of PE admitted for right total knee replacement.  Warfarin held since 3/11 and patient was bridged with Lovenox 40 mg daily for 3 days prior to surgery.  Now post-op and resuming warfarin tonight per pharmacy.  Lovenox 30 mg SQ q12h starting AM of POD1 until INR>/=1.8 per ortho.  Home warfarin dose = 5 mg daily  INR 1.09  3/11 Hgb 14.4, Platelets 183  Goal of Therapy:  INR 2-3 Monitor platelets by anticoagulation protocol: Yes   Plan:  1.  Warfarin 7.5 mg po once  tonight. 2.  Daily INR.  Hershal Coria 01/18/2014,3:23 PM

## 2014-01-18 NOTE — Progress Notes (Signed)
Utilization review completed.  

## 2014-01-19 LAB — BASIC METABOLIC PANEL
BUN: 22 mg/dL (ref 6–23)
CALCIUM: 8.9 mg/dL (ref 8.4–10.5)
CO2: 27 mEq/L (ref 19–32)
Chloride: 98 mEq/L (ref 96–112)
Creatinine, Ser: 1.28 mg/dL (ref 0.50–1.35)
GFR, EST AFRICAN AMERICAN: 64 mL/min — AB (ref >90)
GFR, EST NON AFRICAN AMERICAN: 55 mL/min — AB (ref >90)
Glucose, Bld: 196 mg/dL — ABNORMAL HIGH (ref 70–99)
POTASSIUM: 4.3 meq/L (ref 3.7–5.3)
Sodium: 137 mEq/L (ref 137–147)

## 2014-01-19 LAB — CBC
HEMATOCRIT: 32.5 % — AB (ref 39.0–52.0)
Hemoglobin: 11.8 g/dL — ABNORMAL LOW (ref 13.0–17.0)
MCH: 32.2 pg (ref 26.0–34.0)
MCHC: 36.3 g/dL — AB (ref 30.0–36.0)
MCV: 88.6 fL (ref 78.0–100.0)
Platelets: 204 10*3/uL (ref 150–400)
RBC: 3.67 MIL/uL — ABNORMAL LOW (ref 4.22–5.81)
RDW: 12.6 % (ref 11.5–15.5)
WBC: 12 10*3/uL — ABNORMAL HIGH (ref 4.0–10.5)

## 2014-01-19 LAB — PROTIME-INR
INR: 1.15 (ref 0.00–1.49)
Prothrombin Time: 14.5 seconds (ref 11.6–15.2)

## 2014-01-19 MED ORDER — HYDROMORPHONE HCL 2 MG PO TABS
2.0000 mg | ORAL_TABLET | ORAL | Status: DC | PRN
  Administered 2014-01-19 (×4): 2 mg via ORAL
  Administered 2014-01-20: 4 mg via ORAL
  Administered 2014-01-20: 2 mg via ORAL
  Administered 2014-01-20: 4 mg via ORAL
  Filled 2014-01-19: qty 1
  Filled 2014-01-19: qty 2
  Filled 2014-01-19: qty 1
  Filled 2014-01-19: qty 2
  Filled 2014-01-19 (×3): qty 1

## 2014-01-19 MED ORDER — WARFARIN SODIUM 7.5 MG PO TABS
7.5000 mg | ORAL_TABLET | Freq: Once | ORAL | Status: AC
  Administered 2014-01-19: 7.5 mg via ORAL
  Filled 2014-01-19: qty 1

## 2014-01-19 NOTE — Evaluation (Signed)
Occupational Therapy Evaluation Patient Details Name: [name] MRN: [id number] DOB: 15-Jun-1941 Today's Date: 01/19/2014 Time: 1884-1660 OT Time Calculation (min): 17 min  OT Assessment / Plan / Recommendation History of present illness RTKA   Clinical Impression   Pt was admitted for the above surgery.  All education was completed.  Pt does not need any further OT at this time.    OT Assessment  Patient does not need any further OT services    Follow Up Recommendations  No OT follow up    Barriers to Discharge      Equipment Recommendations  None recommended by OT    Recommendations for Other Services    Frequency       Precautions / Restrictions Precautions Precautions: Knee;Fall Required Braces or Orthoses: Knee Immobilizer - Right Knee Immobilizer - Right:  (discontinued by PT) Restrictions Weight Bearing Restrictions: No   Pertinent Vitals/Pain sats 95% HR 100-118.  No c/o pain  Repositioned with ice    ADL  Grooming: Supervision/safety Where Assessed - Grooming: Supported standing Lower Body Bathing: Moderate assistance Where Assessed - Lower Body Bathing: Supported sit to stand Lower Body Dressing: Maximal assistance Where Assessed - Lower Body Dressing: Supported sit to stand Toilet Transfer: Magazine features editor Method: Sit to Loss adjuster, chartered: Comfort height toilet Toileting - Water quality scientist and Hygiene: Supervision/safety Where [place] and Hygiene: Sit to stand from 3-in-1 or toilet Tub/Shower Transfer: Min guard Tub/Shower Transfer Method: Therapist, art: Walk in Engineer, site Used: Rolling walker Transfers/Ambulation Related to ADLs: ambulated to bathroom; cues for extending leg when sitting ADL Comments: wife will help with adls as needed.  They do not have a reacher at home.  He remodelled bathroom/shower.    OT Diagnosis:    OT Problem List:   OT  Treatment Interventions:     OT Goals(Current goals can be found in the care plan section) Acute Rehab OT Goals Patient Stated Goal: i want to walk without pain  Visit Information  Last OT Received On: 01/19/14 Assistance Needed: +1 History of Present Illness: RTKA       Prior [place] expects to be discharged to:: Private residence Living Arrangements: Spouse/significant other Available Help at Discharge: Family Type of Home: House Home Access: Stairs to enter Technical brewer of Steps: 5 Entrance Stairs-Rails: Left;Right Home Layout: Two level;Bed/bath upstairs Alternate Level Stairs-Number of Steps: 15 Alternate Level Stairs-Rails: Right Home Equipment: Shower seat - built in;Grab bars - tub/shower Prior Function Level of Independence: Independent Communication Communication: No difficulties         Vision/Perception     Cognition  Cognition Arousal/Alertness: Awake/alert Behavior During Therapy: WFL for tasks assessed/performed Overall Cognitive Status: Within Functional Limits for tasks assessed    Extremity/Trunk Assessment Upper Extremity Assessment Upper Extremity Assessment: Overall WFL for tasks assessed     Mobility  Transfers Overall transfer level: Needs assistance Equipment used: Rolling walker (2 wheeled) Transfers: Sit to/from Stand Sit to Stand: Min guard General transfer comment: cues for RLE     Exercise    Balance     End of Session OT - End of Session Activity Tolerance: Patient tolerated treatment well Patient left: in chair;with call bell/phone within reach;with family/visitor present CPM Right Knee CPM Right Knee: Off  GO     [name] 01/19/2014, 10:51 AM Lesle Chris, OTR/L [phone] 01/19/2014

## 2014-01-19 NOTE — Progress Notes (Signed)
Physical Therapy Treatment Patient Details Name: [name] MRN: [id number] DOB: Oct 05, 1941 Today's Date: 01/19/2014 Time: 1950-9326 PT Time Calculation (min): 15 min  PT Assessment / Plan / Recommendation  History of Present Illness RTKA   PT Comments   Pt continues to progress, still able to perform SLR. Plans Dc tomorrow.  Follow Up Recommendations  Home health PT     Does the patient have the potential to tolerate intense rehabilitation     Barriers to Discharge        Equipment Recommendations  Rolling walker with 5" wheels    Recommendations for Other Services    Frequency 7X/week   Progress towards PT Goals Progress towards PT goals: Progressing toward goals  Plan Current plan remains appropriate    Precautions / Restrictions Precautions Precautions: Knee;Fall   Pertinent Vitals/Pain 3 r knee    Mobility  Bed Mobility Bed Mobility: Supine to Sit;Sit to Supine Supine to sit: Supervision Sit to supine: Supervision General bed mobility comments: manages R  leg Transfers Equipment used: Rolling walker (2 wheeled) Transfers: Sit to/from Stand Sit to Stand: Supervision;From elevated surface General transfer comment: cues for RLE Ambulation/Gait Ambulation/Gait assistance: Supervision;Min guard Ambulation Distance (Feet): 250 Feet Assistive device: Rolling walker (2 wheeled) Gait Pattern/deviations: Step-through pattern;Antalgic General Gait Details: cues for sequence and for safety     Exercises Total Joint Exercises Long Arc Quad: AROM;Right;10 reps;Seated Knee Flexion: AROM;Right;10 reps;Seated   PT Diagnosis:    PT Problem List:   PT Treatment Interventions:     PT Goals (current goals can now be found in the care plan section)    Visit Information  Last PT Received On: 01/19/14 Assistance Needed: +1 History of Present Illness: RTKA    Subjective Data      Cognition  Cognition Arousal/Alertness: Awake/alert    Balance     End of  Session PT - End of Session Activity Tolerance: Patient tolerated treatment well Patient left: in bed;with call bell/phone within reach   GP     [name] 01/19/2014, 3:14 PM

## 2014-01-19 NOTE — Progress Notes (Signed)
ANTICOAGULATION CONSULT NOTE - Initial Consult  Pharmacy Consult for Warfarin Indication: VTE prophylaxis  Allergies  Allergen Reactions  . Garlic Anaphylaxis  . Onion Anaphylaxis  . Codeine Other (See Comments)    "PASSES OUT"  . Novocain [Procaine] Nausea Only    Patient Measurements: Height: 6' (182.9 cm) Weight: 230 lb (104.327 kg) IBW/kg (Calculated) : 77.6  Vital Signs: Temp: 98.3 F (36.8 C) (03/17 0611) Temp src: Oral (03/17 0611) BP: 110/68 mmHg (03/17 0611) Pulse Rate: 69 (03/17 0611)  Labs:  Recent Labs  01/18/14 0935 01/19/14 0505  HGB  --  11.8*  HCT  --  32.5*  PLT  --  204  LABPROT 13.9 14.5  INR 1.09 1.15  CREATININE  --  1.28    Estimated Creatinine Clearance: 67.1 ml/min (by C-G formula based on Cr of 1.28).   Medical History: Past Medical History  Diagnosis Date  . History of DVT (deep vein thrombosis)     LAST ONE LEFT CALF, SUPERFICIAL  2012  . History of pulmonary embolism     X3   LAST ONE 2005  . GERD (gastroesophageal reflux disease)   . S/P dilatation of esophageal stricture   . Lung nodule     BENIGN RIGHT UPPER--  CHEST CT 08-22-2011  . Anticoagulated on warfarin   . Arthritis     KNEES, NECK, SHOULDERS, HANDS  . Gout, arthritis     BILATERAL GREAT TOE-- PER PT STABLE  . Anxiety   . Asthma   . OSA (obstructive sleep apnea)     NO CPAP -states dr said possibly related to asthma  . Cancer     skin cancer-  2 basal cell, 1 squamous cell  . H/O transfusion of packed red blood cells     as a child/ s/p tonsillectomy      Assessment: 3 yoM on chronic warfarin PTA for history of PE admitted for right total knee replacement.  Warfarin held since 3/11 and patient was bridged with Lovenox 40 mg daily for 3 days prior to surgery.  Now post-op and resumed on 3/16.  Lovenox 30 mg SQ q12h starting AM of POD1 until INR>/=1.8 per ortho.  Home warfarin dose = 5 mg daily  INR 1.15  CBC okay, no bleeding issues noted   Goal of  Therapy:  INR 2-3 Monitor platelets by anticoagulation protocol: Yes   Plan:  1.  Warfarin 7.5 mg po once tonight. 2.  Daily INR. 3. Continue Lovenox 30 mg Q12h, d/c when INR >/= 1.8   Guida Asman, Gaye Alken PharmD Pager #: [phone] 7:10 AM 01/19/2014

## 2014-01-19 NOTE — Progress Notes (Signed)
   Subjective: 1 Day Post-Op Procedure(s) (LRB): RIGHT TOTAL KNEE ARTHROPLASTY (Right) Patient reports pain as mild.   Patient seen in rounds for Dr. Wynelle Link. Patient is well, and has had no acute complaints or problems.  He states that he was able to get some rest last night We will start therapy today.  Plan is to go Home after hospital stay.  Objective: Vital signs in last 24 hours: Temp:  [97.4 F (36.3 C)-98.6 F (37 C)] 98.3 F (36.8 C) (03/17 0611) Pulse Rate:  [69-102] 69 (03/17 0611) Resp:  [15-18] 18 (03/17 0719) BP: (110-170)/(66-87) 110/68 mmHg (03/17 0611) SpO2:  [92 %-100 %] 97 % (03/17 0719) Weight:  [104.327 kg (230 lb)] 104.327 kg (230 lb) (03/16 1600)  Intake/Output from previous day:  Intake/Output Summary (Last 24 hours) at 01/19/14 0855 Last data filed at 01/19/14 0720  Gross per 24 hour  Intake 4538.33 ml  Output   2670 ml  Net 1868.33 ml    Intake/Output this shift: UOP 725 since MN +1868  Labs:  Recent Labs  01/19/14 0505  HGB 11.8*    Recent Labs  01/19/14 0505  WBC 12.0*  RBC 3.67*  HCT 32.5*  PLT 204    Recent Labs  01/19/14 0505  NA 137  K 4.3  CL 98  CO2 27  BUN 22  CREATININE 1.28  GLUCOSE 196*  CALCIUM 8.9    Recent Labs  01/18/14 0935 01/19/14 0505  INR 1.09 1.15    EXAM General - Patient is Alert, Appropriate and Oriented Extremity - Neurovascular intact Sensation intact distally Dorsiflexion/Plantar flexion intact Dressing - dressing C/D/I Motor Function - intact, moving foot and toes well on exam.  Hemovac pulled without difficulty.  Past Medical History  Diagnosis Date  . History of DVT (deep vein thrombosis)     LAST ONE LEFT CALF, SUPERFICIAL  2012  . History of pulmonary embolism     X3   LAST ONE 2005  . GERD (gastroesophageal reflux disease)   . S/P dilatation of esophageal stricture   . Lung nodule     BENIGN RIGHT UPPER--  CHEST CT 08-22-2011  . Anticoagulated on warfarin   .  Arthritis     KNEES, NECK, SHOULDERS, HANDS  . Gout, arthritis     BILATERAL GREAT TOE-- PER PT STABLE  . Anxiety   . Asthma   . OSA (obstructive sleep apnea)     NO CPAP -states dr said possibly related to asthma  . Cancer     skin cancer-  2 basal cell, 1 squamous cell  . H/O transfusion of packed red blood cells     as a child/ s/p tonsillectomy    Assessment/Plan: 1 Day Post-Op Procedure(s) (LRB): RIGHT TOTAL KNEE ARTHROPLASTY (Right) Principal Problem:   OA (osteoarthritis) of knee  Estimated body mass index is 31.19 kg/(m^2) as calculated from the following:   Height as of this encounter: 6' (1.829 m).   Weight as of this encounter: 104.327 kg (230 lb). Advance diet Up with therapy D/C IV fluids Plan for discharge tomorrow Discharge home with home health  DVT Prophylaxis - Lovenox and Coumadin Weight-Bearing as tolerated to right leg D/C O2 and Pulse OX and try on Room Air  Mickel Crow 01/19/2014, 8:55 AM

## 2014-01-19 NOTE — Evaluation (Signed)
Physical Therapy Evaluation Patient Details Name: [name] MRN: [id number] DOB: 1940/11/29 Today's Date: 01/19/2014 Time: 7169-6789 PT Time Calculation (min): 29 min  PT Assessment / Plan / Recommendation History of Present Illness  RTKA  Clinical Impression  Pt tolerated ambulating x 250 ' . Able to perform SLR. Encouraged pt to keep up with meds today. Plans DC home. Has bedroom on 2nd level. Pt will benefit from PT to address problems.    PT Assessment  Patient needs continued PT services    Follow Up Recommendations  Home health PT    Does the patient have the potential to tolerate intense rehabilitation      Barriers to Discharge        Equipment Recommendations  Rolling walker with 5" wheels    Recommendations for Other Services     Frequency 7X/week    Precautions / Restrictions Precautions Precautions: Knee;Fall Required Braces or Orthoses: Knee Immobilizer - Right Knee Immobilizer - Right: Discontinue once straight leg raise with < 10 degree lag Restrictions Weight Bearing Restrictions: No   Pertinent Vitals/Pain < 3 R knee      Mobility  Bed Mobility Overal bed mobility: Needs Assistance Bed Mobility: Supine to Sit Supine to sit: Min assist General bed mobility comments: cues for technique and for support R leg. Transfers Overall transfer level: Needs assistance Equipment used: Rolling walker (2 wheeled) Transfers: Sit to/from Stand Sit to Stand: From elevated surface;Min assist General transfer comment: cues for hand placement and for R leg position Ambulation/Gait Ambulation/Gait assistance: Min assist Ambulation Distance (Feet): 250 Feet Assistive device: Rolling walker (2 wheeled) Gait Pattern/deviations: Step-through pattern;Antalgic;Decreased step length - right;Decreased stance time - right General Gait Details: cues for sequence and for safety when KI removed during ambulation.    Exercises Total Joint Exercises Ankle  Circles/Pumps: AROM;Both;15 reps;Supine Quad Sets: AROM;Right;10 reps;Supine Short Arc Quad: AROM;Right;10 reps;Supine Heel Slides: AAROM;Right;10 reps;Supine Straight Leg Raises: AROM;Right;10 reps;Supine   PT Diagnosis: Difficulty walking  PT Problem List: Decreased strength;Decreased range of motion;Decreased activity tolerance;Decreased balance;Decreased mobility;Decreased knowledge of precautions;Decreased safety awareness;Decreased knowledge of use of DME;Pain PT Treatment Interventions: DME instruction;Gait training;Stair training;Functional mobility training;Therapeutic activities;Therapeutic exercise;Patient/family education     PT Goals(Current goals can be found in the care plan section) Acute Rehab PT Goals Patient Stated Goal: i want to walk without pain PT Goal Formulation: With patient/family Time For Goal Achievement: 01/22/14 Potential to Achieve Goals: Good  Visit Information  Last PT Received On: 01/19/14 Assistance Needed: +1 History of Present Illness: RTKA       Prior Functioning  Home Living Family/patient expects to be discharged to:: Private residence Living Arrangements: Spouse/significant other Available Help at Discharge: Family Type of Home: House Home Access: Stairs to enter Technical brewer of Steps: 5 Entrance Stairs-Rails: Left;Right Home Layout: Two level;Bed/bath upstairs Alternate Level Stairs-Number of Steps: 15 Alternate Level Stairs-Rails: Right Home Equipment: Cane - single point    Cognition  Cognition Arousal/Alertness: Awake/alert Behavior During Therapy: WFL for tasks assessed/performed Overall Cognitive Status: Within Functional Limits for tasks assessed    Extremity/Trunk Assessment Upper Extremity Assessment Upper Extremity Assessment: Defer to OT evaluation Lower Extremity Assessment Lower Extremity Assessment: RLE deficits/detail RLE Deficits / Details: performs SLR, knee flexion 50 *   Balance    End of Session  PT - End of Session Equipment Utilized During Treatment: Right knee immobilizer Activity Tolerance: Patient tolerated treatment well Patient left: in chair;with call bell/phone within reach;with family/visitor present Nurse Communication: Mobility status CPM  Right Knee CPM Right Knee: Off  GP     [name] 01/19/2014, 10:21 AM Tresa Endo PT [phone]

## 2014-01-20 DIAGNOSIS — D62 Acute posthemorrhagic anemia: Secondary | ICD-10-CM | POA: Diagnosis not present

## 2014-01-20 LAB — CBC
HCT: 30.7 % — ABNORMAL LOW (ref 39.0–52.0)
Hemoglobin: 10.8 g/dL — ABNORMAL LOW (ref 13.0–17.0)
MCH: 32 pg (ref 26.0–34.0)
MCHC: 35.2 g/dL (ref 30.0–36.0)
MCV: 91.1 fL (ref 78.0–100.0)
PLATELETS: 204 10*3/uL (ref 150–400)
RBC: 3.37 MIL/uL — ABNORMAL LOW (ref 4.22–5.81)
RDW: 13 % (ref 11.5–15.5)
WBC: 14 10*3/uL — ABNORMAL HIGH (ref 4.0–10.5)

## 2014-01-20 LAB — PROTIME-INR
INR: 1.3 (ref 0.00–1.49)
PROTHROMBIN TIME: 15.9 s — AB (ref 11.6–15.2)

## 2014-01-20 LAB — BASIC METABOLIC PANEL
BUN: 23 mg/dL (ref 6–23)
CALCIUM: 9 mg/dL (ref 8.4–10.5)
CO2: 28 mEq/L (ref 19–32)
CREATININE: 1.08 mg/dL (ref 0.50–1.35)
Chloride: 102 mEq/L (ref 96–112)
GFR calc Af Amer: 78 mL/min — ABNORMAL LOW (ref >90)
GFR calc non Af Amer: 68 mL/min — ABNORMAL LOW (ref >90)
Glucose, Bld: 161 mg/dL — ABNORMAL HIGH (ref 70–99)
Potassium: 4.3 mEq/L (ref 3.7–5.3)
Sodium: 140 mEq/L (ref 137–147)

## 2014-01-20 MED ORDER — METHOCARBAMOL 500 MG PO TABS: 500.0000 mg | ORAL_TABLET | Freq: Four times a day (QID) | ORAL | Status: DC | PRN

## 2014-01-20 MED ORDER — WARFARIN SODIUM 5 MG PO TABS: 5.0000 mg | ORAL_TABLET | Freq: Every evening | ORAL | Status: DC

## 2014-01-20 MED ORDER — ENOXAPARIN SODIUM 40 MG/0.4ML ~~LOC~~ SOLN: 40.0000 mg | SUBCUTANEOUS | Status: DC

## 2014-01-20 MED ORDER — HYDROMORPHONE HCL 2 MG PO TABS: 2.0000 mg | ORAL_TABLET | ORAL | Status: DC | PRN

## 2014-01-20 NOTE — Progress Notes (Signed)
   Subjective: 2 Days Post-Op Procedure(s) (LRB): RIGHT TOTAL KNEE ARTHROPLASTY (Right) Patient reports pain as mild.   Patient seen in rounds with Dr. Wynelle Link. Patient is well, and has had no acute complaints or problems Patient is ready to go home today  Objective: Vital signs in last 24 hours: Temp:  [98.1 F (36.7 C)-98.8 F (37.1 C)] 98.4 F (36.9 C) (03/18 0535) Pulse Rate:  [83-105] 91 (03/18 0535) Resp:  [16-18] 16 (03/18 0535) BP: (115-133)/(56-74) 124/56 mmHg (03/18 0535) SpO2:  [95 %] 95 % (03/18 0535)  Intake/Output from previous day:  Intake/Output Summary (Last 24 hours) at 01/20/14 0724 Last data filed at 01/20/14 0535  Gross per 24 hour  Intake 1312.66 ml  Output   1775 ml  Net -462.34 ml    Intake/Output this shift:    Labs:  Recent Labs  01/19/14 0505 01/20/14 0500  HGB 11.8* 10.8*    Recent Labs  01/19/14 0505 01/20/14 0500  WBC 12.0* 14.0*  RBC 3.67* 3.37*  HCT 32.5* 30.7*  PLT 204 204    Recent Labs  01/19/14 0505 01/20/14 0500  NA 137 140  K 4.3 4.3  CL 98 102  CO2 27 28  BUN 22 23  CREATININE 1.28 1.08  GLUCOSE 196* 161*  CALCIUM 8.9 9.0    Recent Labs  01/19/14 0505 01/20/14 0500  INR 1.15 1.30    EXAM: General - Patient is Alert, Appropriate and Oriented Extremity - Neurovascular intact Sensation intact distally Dorsiflexion/Plantar flexion intact Incision - clean, dry, no drainage, healing Motor Function - intact, moving foot and toes well on exam.   Assessment/Plan: 2 Days Post-Op Procedure(s) (LRB): RIGHT TOTAL KNEE ARTHROPLASTY (Right) Procedure(s) (LRB): RIGHT TOTAL KNEE ARTHROPLASTY (Right) Past Medical History  Diagnosis Date  . History of DVT (deep vein thrombosis)     LAST ONE LEFT CALF, SUPERFICIAL  2012  . History of pulmonary embolism     X3   LAST ONE 2005  . GERD (gastroesophageal reflux disease)   . S/P dilatation of esophageal stricture   . Lung nodule     BENIGN RIGHT UPPER--  CHEST  CT 08-22-2011  . Anticoagulated on warfarin   . Arthritis     KNEES, NECK, SHOULDERS, HANDS  . Gout, arthritis     BILATERAL GREAT TOE-- PER PT STABLE  . Anxiety   . Asthma   . OSA (obstructive sleep apnea)     NO CPAP -states dr said possibly related to asthma  . Cancer     skin cancer-  2 basal cell, 1 squamous cell  . H/O transfusion of packed red blood cells     as a child/ s/p tonsillectomy   Principal Problem:   OA (osteoarthritis) of knee Active Problems:   Postoperative anemia due to acute blood loss  Estimated body mass index is 31.19 kg/(m^2) as calculated from the following:   Height as of this encounter: 6' (1.829 m).   Weight as of this encounter: 104.327 kg (230 lb). Up with therapy Discharge home with home health Diet - Cardiac diet and Renal diet Follow up - in 2 weeks Activity - WBAT Disposition - Home Condition Upon Discharge - Good D/C Meds - See DC Summary DVT Prophylaxis - Lovenox and Coumadin INR 1.30 today  [name] 01/20/2014, 7:24 AM

## 2014-01-20 NOTE — Discharge Summary (Signed)
Physician Discharge Summary   Patient ID: [name] MRN: [id number] DOB/AGE: 1941/07/15 73 y.o.  Admit date: 01/18/2014 Discharge date: 01-20-2014  Primary Diagnosis:  Osteoarthritis Right knee(s)  Admission Diagnoses:  Past Medical History  Diagnosis Date  . History of DVT (deep vein thrombosis)     LAST ONE LEFT CALF, SUPERFICIAL  2012  . History of pulmonary embolism     X3   LAST ONE 2005  . GERD (gastroesophageal reflux disease)   . S/P dilatation of esophageal stricture   . Lung nodule     BENIGN RIGHT UPPER--  CHEST CT 08-22-2011  . Anticoagulated on warfarin   . Arthritis     KNEES, NECK, SHOULDERS, HANDS  . Gout, arthritis     BILATERAL GREAT TOE-- PER PT STABLE  . Anxiety   . Asthma   . OSA (obstructive sleep apnea)     NO CPAP -states dr said possibly related to asthma  . Cancer     skin cancer-  2 basal cell, 1 squamous cell  . H/O transfusion of packed red blood cells     as a child/ s/p tonsillectomy   Discharge Diagnoses:   Principal Problem:   OA (osteoarthritis) of knee Active Problems:   Postoperative anemia due to acute blood loss  Estimated body mass index is 31.19 kg/(m^2) as calculated from the following:   Height as of this encounter: 6' (1.829 m).   Weight as of this encounter: 104.327 kg (230 lb).  Procedure:  Procedure(s) (LRB): RIGHT TOTAL KNEE ARTHROPLASTY (Right)   Consults: None  HPI: [name] is a 73 y.o. year old male with end stage OA of his right knee with progressively worsening pain and dysfunction. He has constant pain, with activity and at rest and significant functional deficits with difficulties even with ADLs. He has had extensive non-op management including analgesics, injections of cortisone, and home exercise program, but remains in significant pain with significant dysfunction. Radiographs show bone on bone arthritis medial and patellofemoral. He presents now for right Total Knee Arthroplasty.   Laboratory  Data: Admission on 01/18/2014  Component Date Value Ref Range Status  . Prothrombin Time 01/18/2014 13.9  11.6 - 15.2 seconds Final  . INR 01/18/2014 1.09  0.00 - 1.49 Final  . WBC 01/19/2014 12.0* 4.0 - 10.5 K/uL Final  . RBC 01/19/2014 3.67* 4.22 - 5.81 MIL/uL Final  . Hemoglobin 01/19/2014 11.8* 13.0 - 17.0 g/dL Final  . HCT 01/19/2014 32.5* 39.0 - 52.0 % Final  . MCV 01/19/2014 88.6  78.0 - 100.0 fL Final  . MCH 01/19/2014 32.2  26.0 - 34.0 pg Final  . MCHC 01/19/2014 36.3* 30.0 - 36.0 g/dL Final  . RDW 01/19/2014 12.6  11.5 - 15.5 % Final  . Platelets 01/19/2014 204  150 - 400 K/uL Final  . Sodium 01/19/2014 137  137 - 147 mEq/L Final  . Potassium 01/19/2014 4.3  3.7 - 5.3 mEq/L Final  . Chloride 01/19/2014 98  96 - 112 mEq/L Final  . CO2 01/19/2014 27  19 - 32 mEq/L Final  . Glucose, Bld 01/19/2014 196* 70 - 99 mg/dL Final  . BUN 01/19/2014 22  6 - 23 mg/dL Final  . Creatinine, Ser 01/19/2014 1.28  0.50 - 1.35 mg/dL Final  . Calcium 01/19/2014 8.9  8.4 - 10.5 mg/dL Final  . GFR calc non Af Amer 01/19/2014 55* >90 mL/min Final  . GFR calc Af Amer 01/19/2014 64* >90 mL/min Final   Comment: (  NOTE)                          The eGFR has been calculated using the CKD EPI equation.                          This calculation has not been validated in all clinical situations.                          eGFR's persistently <90 mL/min signify possible Chronic Kidney                          Disease.  [name] Prothrombin Time 01/19/2014 14.5  11.6 - 15.2 seconds Final  . INR 01/19/2014 1.15  0.00 - 1.49 Final  . WBC 01/20/2014 14.0* 4.0 - 10.5 K/uL Final  . RBC 01/20/2014 3.37* 4.22 - 5.81 MIL/uL Final  . Hemoglobin 01/20/2014 10.8* 13.0 - 17.0 g/dL Final  . HCT 01/20/2014 30.7* 39.0 - 52.0 % Final  . MCV 01/20/2014 91.1  78.0 - 100.0 fL Final  . MCH 01/20/2014 32.0  26.0 - 34.0 pg Final  . MCHC 01/20/2014 35.2  30.0 - 36.0 g/dL Final  . RDW 01/20/2014 13.0  11.5 - 15.5 % Final  . Platelets  01/20/2014 204  150 - 400 K/uL Final  . Sodium 01/20/2014 140  137 - 147 mEq/L Final  . Potassium 01/20/2014 4.3  3.7 - 5.3 mEq/L Final  . Chloride 01/20/2014 102  96 - 112 mEq/L Final  . CO2 01/20/2014 28  19 - 32 mEq/L Final  . Glucose, Bld 01/20/2014 161* 70 - 99 mg/dL Final  . BUN 01/20/2014 23  6 - 23 mg/dL Final  . Creatinine, Ser 01/20/2014 1.08  0.50 - 1.35 mg/dL Final  . Calcium 01/20/2014 9.0  8.4 - 10.5 mg/dL Final  . GFR calc non Af Amer 01/20/2014 68* >90 mL/min Final  . GFR calc Af Amer 01/20/2014 78* >90 mL/min Final   Comment: (NOTE)                          The eGFR has been calculated using the CKD EPI equation.                          This calculation has not been validated in all clinical situations.                          eGFR's persistently <90 mL/min signify possible Chronic Kidney                          Disease.  [name] Prothrombin Time 01/20/2014 15.9* 11.6 - 15.2 seconds Final  . INR 01/20/2014 1.30  0.00 - 1.49 Final  Hospital Outpatient Visit on 01/13/2014  Component Date Value Ref Range Status  . aPTT 01/13/2014 42* 24 - 37 seconds Final   Comment:                                 IF BASELINE aPTT IS ELEVATED,  SUGGEST PATIENT RISK ASSESSMENT                          BE USED TO DETERMINE APPROPRIATE                          ANTICOAGULANT THERAPY.  . WBC 01/13/2014 4.2  4.0 - 10.5 K/uL Final  . RBC 01/13/2014 4.50  4.22 - 5.81 MIL/uL Final  . Hemoglobin 01/13/2014 14.4  13.0 - 17.0 g/dL Final  . HCT 01/13/2014 40.1  39.0 - 52.0 % Final  . MCV 01/13/2014 89.1  78.0 - 100.0 fL Final  . MCH 01/13/2014 32.0  26.0 - 34.0 pg Final  . MCHC 01/13/2014 35.9  30.0 - 36.0 g/dL Final  . RDW 01/13/2014 12.6  11.5 - 15.5 % Final  . Platelets 01/13/2014 183  150 - 400 K/uL Final  . Sodium 01/13/2014 142  137 - 147 mEq/L Final  . Potassium 01/13/2014 4.4  3.7 - 5.3 mEq/L Final  . Chloride 01/13/2014 104  96 - 112 mEq/L Final  . CO2 01/13/2014  26  19 - 32 mEq/L Final  . Glucose, Bld 01/13/2014 103* 70 - 99 mg/dL Final  . BUN 01/13/2014 17  6 - 23 mg/dL Final  . Creatinine, Ser 01/13/2014 1.10  0.50 - 1.35 mg/dL Final  . Calcium 01/13/2014 9.3  8.4 - 10.5 mg/dL Final  . Total Protein 01/13/2014 7.3  6.0 - 8.3 g/dL Final  . Albumin 01/13/2014 4.3  3.5 - 5.2 g/dL Final  . AST 01/13/2014 18  0 - 37 U/L Final  . ALT 01/13/2014 14  0 - 53 U/L Final  . Alkaline Phosphatase 01/13/2014 61  39 - 117 U/L Final  . Total Bilirubin 01/13/2014 0.4  0.3 - 1.2 mg/dL Final  . GFR calc non Af Amer 01/13/2014 66* >90 mL/min Final  . GFR calc Af Amer 01/13/2014 77* >90 mL/min Final   Comment: (NOTE)                          The eGFR has been calculated using the CKD EPI equation.                          This calculation has not been validated in all clinical situations.                          eGFR's persistently <90 mL/min signify possible Chronic Kidney                          Disease.  [name] Prothrombin Time 01/13/2014 25.8* 11.6 - 15.2 seconds Final  . INR 01/13/2014 2.45* 0.00 - 1.49 Final  . ABO/RH(D) 01/13/2014 A POS   Final  . Antibody Screen 01/13/2014 NEG   Final  . Sample Expiration 01/13/2014 01/21/2014   Final  . Color, Urine 01/13/2014 YELLOW  YELLOW Final  . APPearance 01/13/2014 CLEAR  CLEAR Final  . Specific Gravity, Urine 01/13/2014 1.007  1.005 - 1.030 Final  . pH 01/13/2014 5.5  5.0 - 8.0 Final  . Glucose, UA 01/13/2014 NEGATIVE  NEGATIVE mg/dL Final  . Hgb urine dipstick 01/13/2014 NEGATIVE  NEGATIVE Final  . Bilirubin Urine 01/13/2014 NEGATIVE  NEGATIVE Final  . Ketones, ur 01/13/2014 NEGATIVE  NEGATIVE mg/dL Final  .  Protein, ur 01/13/2014 NEGATIVE  NEGATIVE mg/dL Final  . Urobilinogen, UA 01/13/2014 0.2  0.0 - 1.0 mg/dL Final  . Nitrite 01/13/2014 NEGATIVE  NEGATIVE Final  . Leukocytes, UA 01/13/2014 NEGATIVE  NEGATIVE Final   MICROSCOPIC NOT DONE ON URINES WITH NEGATIVE PROTEIN, BLOOD, LEUKOCYTES, NITRITE, OR GLUCOSE  <1000 mg/dL.  [name] MRSA, PCR 01/13/2014 NEGATIVE  NEGATIVE Final  . Staphylococcus aureus 01/13/2014 NEGATIVE  NEGATIVE Final   Comment:                                 The Xpert SA Assay (FDA                          approved for NASAL specimens                          in patients over 51 years of age),                          is one component of                          a comprehensive surveillance                          program.  Test performance has                          been validated by [employer] for patients greater                          than or equal to 39 year old.                          It is not intended                          to diagnose infection nor to                          guide or monitor treatment.  . ABO/RH(D) 01/13/2014 A POS   Final     X-Rays:Dg Chest 2 View  01/13/2014   CLINICAL DATA Preoperative evaluation for knee surgery, history of asthma  EXAM CHEST  2 VIEW  COMPARISON 07/12/2009  FINDINGS The heart size and mediastinal contours are within normal limits. Both lungs are clear. The visualized skeletal structures are unremarkable.  IMPRESSION No active cardiopulmonary disease.  SIGNATURE  Electronically Signed   By: Inez Catalina M.D.   On: 01/13/2014 10:52    EKG: Orders placed during the hospital encounter of 01/13/14  . EKG 12-LEAD  . EKG 12-LEAD     Hospital Course: [name] is a 73 y.o. who was admitted to [facility]. They were brought to the operating room on 01/18/2014 and underwent Procedure(s): RIGHT TOTAL KNEE ARTHROPLASTY.  Patient tolerated the procedure well and was later transferred to the recovery room and then to the orthopaedic floor  for postoperative care.  They were given PO and IV analgesics for pain control following their surgery.  They were given 24 hours of postoperative antibiotics of  Anti-infectives   Start     Dose/Rate Route Frequency Ordered Stop   01/18/14 1800  ceFAZolin  (ANCEF) IVPB 2 g/50 mL premix     2 g 100 mL/hr over 30 Minutes Intravenous Every 6 hours 01/18/14 1458 01/18/14 2354   01/18/14 0920  ceFAZolin (ANCEF) IVPB 2 g/50 mL premix     2 g 100 mL/hr over 30 Minutes Intravenous On call to O.R. 01/18/14 0920 01/18/14 1215     and started on DVT prophylaxis in the form of Lovenox and Coumadin.   PT and OT were ordered for total joint protocol.  Discharge planning consulted to help with postop disposition and equipment needs.  Patient had a very good night on the evening of surgery.  They started to get up OOB with therapy on day one. Hemovac drain was pulled without difficulty.  Continued to work with therapy into day two.  Dressing was changed on day two and the incision was healing well. Patient was seen in rounds by Dr. Wynelle Link on day two and was ready to go home.   Discharge Medications: Prior to Admission medications   Medication Sig Start Date End Date Taking? Authorizing Provider  acetaminophen (TYLENOL) 500 MG tablet Take 1,000 mg by mouth 2 (two) times daily. And prn   Yes Historical Provider, MD  albuterol (PROVENTIL HFA;VENTOLIN HFA) 108 (90 BASE) MCG/ACT inhaler Inhale 2 puffs into the lungs every 6 (six) hours as needed for wheezing or shortness of breath.   Yes Historical Provider, MD  albuterol-ipratropium (COMBIVENT) 18-103 MCG/ACT inhaler Inhale 2 puffs into the lungs every 4 (four) hours as needed for wheezing or shortness of breath.   Yes Historical Provider, MD  allopurinol (ZYLOPRIM) 300 MG tablet Take 300 mg by mouth daily.   Yes Historical Provider, MD  Cimetidine (ACID REDUCER PO) Take 1 tablet by mouth at bedtime as needed (heart burn).    Yes Historical Provider, MD  escitalopram (LEXAPRO) 20 MG tablet Take 20 mg by mouth every Saturday.    Yes Historical Provider, MD  enoxaparin (LOVENOX) 40 MG/0.4ML injection Inject 0.4 mLs (40 mg total) into the skin daily. Take daily injection for the next three days and then discontinue.  01/20/14   Joann Kulpa, PA-C  HYDROmorphone (DILAUDID) 2 MG tablet Take 1-2 tablets (2-4 mg total) by mouth every 3 (three) hours as needed for moderate pain or severe pain. 01/20/14   Betzayda Braxton Dara Lords, PA-C  methocarbamol (ROBAXIN) 500 MG tablet Take 1 tablet (500 mg total) by mouth every 6 (six) hours as needed for muscle spasms. 01/20/14   Shaela Boer Dara Lords, PA-C  warfarin (COUMADIN) 5 MG tablet Take 1 tablet (5 mg total) by mouth every evening. Take Coumadin for three weeks for postoperative protocol and then the patient may resume their previous Coumadin home regimen.  The dose may need to be adjusted based upon the INR.  Please follow the INR and titrate Coumadin dose for a therapeutic range between 2.0 and 3.0 INR.  After completing the three weeks of Coumadin, the patient may resume their previous Coumadin home regimen. 01/20/14   Claudell Rhody Dara Lords, PA-C   Discharge home with home health  Diet - Cardiac diet and Renal diet  Follow up - in 2 weeks  Activity - WBAT  Disposition - Home  Condition Upon Discharge - Good  D/C Meds - See DC Summary  DVT Prophylaxis - Lovenox and Coumadin  INR 1.30 today at time of discharge    Discharge Orders   Future Orders Complete By Expires   Call MD / Call 911  As directed    Comments:     If you experience chest pain or shortness of breath, CALL 911 and be transported to the hospital emergency room.  If you develope a fever above 101 F, pus (white drainage) or increased drainage or redness at the wound, or calf pain, call your surgeon's office.   Change dressing  As directed    Comments:     Change dressing daily with sterile 4 x 4 inch gauze dressing and apply TED hose. Do not submerge the incision under water.   Constipation Prevention  As directed    Comments:     Drink plenty of fluids.  Prune juice may be helpful.  You may use a stool softener, such as Colace (over the counter) 100 mg twice a day.  Use MiraLax (over the counter) for  constipation as needed.   Diet - low sodium heart healthy  As directed    Discharge instructions  As directed    Comments:     Pick up stool softner and laxative for home. Do not submerge incision under water. May shower. Continue to use ice for pain and swelling from surgery.  Take Coumadin for three weeks for postoperative protocol and then the patient may resume their previous Coumadin home regimen.  The dose may need to be adjusted based upon the INR.  Please follow the INR and titrate Coumadin dose for a therapeutic range between 2.0 and 3.0 INR.  After completing the three weeks of Coumadin, the patient may resume their previous Coumadin home regimen.  Continue Lovenox injections for the next three days and then can discontinue injections.   Do not put a pillow under the knee. Place it under the heel.  As directed    Do not sit on low chairs, stoools or toilet seats, as it may be difficult to get up from low surfaces  As directed    Driving restrictions  As directed    Comments:     No driving until released by the physician.   Increase activity slowly as tolerated  As directed    Lifting restrictions  As directed    Comments:     No lifting until released by the physician.   Patient may shower  As directed    Comments:     You may shower without a dressing once there is no drainage.  Do not wash over the wound.  If drainage remains, do not shower until drainage stops.   TED hose  As directed    Comments:     Use stockings (TED hose) for 3 weeks on both leg(s).  You may remove them at night for sleeping.   Weight bearing as tolerated  As directed    Questions:     Laterality:     Extremity:         Medication List    STOP taking these medications       tadalafil 20 MG tablet  Commonly known as:  CIALIS      TAKE these medications       ACID REDUCER PO  Take 1 tablet by mouth at bedtime as needed (heart burn).     albuterol 108 (90 BASE) MCG/ACT inhaler  Commonly  known  as:  PROVENTIL HFA;VENTOLIN HFA  Inhale 2 puffs into the lungs every 6 (six) hours as needed for wheezing or shortness of breath.     albuterol-ipratropium 18-103 MCG/ACT inhaler  Commonly known as:  COMBIVENT  Inhale 2 puffs into the lungs every 4 (four) hours as needed for wheezing or shortness of breath.     allopurinol 300 MG tablet  Commonly known as:  ZYLOPRIM  Take 300 mg by mouth daily.     enoxaparin 40 MG/0.4ML injection  Commonly known as:  LOVENOX  Inject 0.4 mLs (40 mg total) into the skin daily. Take daily injection for the next three days and then discontinue.     escitalopram 20 MG tablet  Commonly known as:  LEXAPRO  Take 20 mg by mouth every Saturday.     HYDROmorphone 2 MG tablet  Commonly known as:  DILAUDID  Take 1-2 tablets (2-4 mg total) by mouth every 3 (three) hours as needed for moderate pain or severe pain.     methocarbamol 500 MG tablet  Commonly known as:  ROBAXIN  Take 1 tablet (500 mg total) by mouth every 6 (six) hours as needed for muscle spasms.     TYLENOL 500 MG tablet  Generic drug:  acetaminophen  Take 1,000 mg by mouth 2 (two) times daily. And prn     warfarin 5 MG tablet  Commonly known as:  COUMADIN  Take 1 tablet (5 mg total) by mouth every evening. Take Coumadin for three weeks for postoperative protocol and then the patient may resume their previous Coumadin home regimen.  The dose may need to be adjusted based upon the INR.  Please follow the INR and titrate Coumadin dose for a therapeutic range between 2.0 and 3.0 INR.  After completing the three weeks of Coumadin, the patient may resume their previous Coumadin home regimen.           Follow-up Information   Follow up with [facility]. [facility])    Contact information:   [place] [address] [phone]       Follow up with Gearlean Alf, MD. Schedule an appointment as soon as possible for a visit on  02/02/2014.   Specialty:  Orthopedic Surgery   Contact information:   [address] [place] [address] [phone]       Signed: Mickel Crow 01/20/2014, 8:00 AM

## 2014-01-20 NOTE — Progress Notes (Signed)
Discharged from floor via w/c, wife with pt. No changes in assessment. Lalisa Kiehn  

## 2014-01-20 NOTE — Progress Notes (Signed)
Discharge summary sent to payer through [employer]  

## 2014-01-20 NOTE — Progress Notes (Signed)
01/20/2014 1500 Pt received 3n1 prior to dc. Jonnie Finner RNCCM Case Mgmt phone [phone]

## 2014-01-20 NOTE — Progress Notes (Signed)
Physical Therapy Treatment Patient Details Name: [name] MRN: [id number] DOB: [birth year] Today's Date: 01/20/2014 Time: 5621-3086 PT Time Calculation (min): 44 min  PT Assessment / Plan / Recommendation  History of Present Illness RTKA   PT Comments   Pt having more quad soreness today. Became dyspneic on stairs. Pt will have 2 persons to assist on the steps at home.  Follow Up Recommendations  Home health PT;Supervision/Assistance - 24 hour     Does the patient have the potential to tolerate intense rehabilitation     Barriers to Discharge        Equipment Recommendations  Rolling walker with 5" wheels    Recommendations for Other Services    Frequency 7X/week   Progress towards PT Goals Progress towards PT goals: Progressing toward goals  Plan Current plan remains appropriate    Precautions / Restrictions Precautions Precautions: Knee;Fall Required Braces or Orthoses: Knee Immobilizer - Right Knee Immobilizer - Right: Discontinue once straight leg raise with < 10 degree lag   Pertinent Vitals/Pain mildly clammy, RN aware.    Mobility  Bed Mobility Supine to sit: Supervision General bed mobility comments: manages R  leg Transfers Overall transfer level: Needs assistance Equipment used: Rolling walker (2 wheeled) Transfers: Sit to/from Stand Sit to Stand: Supervision;From elevated surface General transfer comment: cues for RLE, cues for safety and reaching back to recliner. Ambulation/Gait Ambulation/Gait assistance: Min guard Ambulation Distance (Feet): 30 Feet Assistive device: Rolling walker (2 wheeled) Gait Pattern/deviations: Step-through pattern;Antalgic General Gait Details: cues for sequence and for safety  Stairs: Yes Stairs assistance: +2 safety/equipment Stair Management: One rail Right;Step to pattern;Forwards;With cane Number of Stairs: 4 General stair comments: Pt  used R rail with both hands, had  some difficulty, used rail and cane  descending. wife present. Pt became  wheezy and dyspneic after getting to top. RN aware that pt used inhaler from home. Pt will have daughter come to assist getting up the steps, pt will rest on first level if needed prior to going up to second.    Exercises Total Joint Exercises Ankle Circles/Pumps: AROM;Both;15 reps;Supine Quad Sets: AROM;Right;10 reps;Supine Heel Slides: AAROM;Right;10 reps;Supine Hip ABduction/ADduction: AAROM;Right;10 reps Straight Leg Raises: AAROM   PT Diagnosis:    PT Problem List:   PT Treatment Interventions:     PT Goals (current goals can now be found in the care plan section)    Visit Information  Last PT Received On: 01/20/14 Assistance Needed: +2 (for steps.) History of Present Illness: RTKA    Subjective Data      Cognition  Cognition Arousal/Alertness: Awake/alert    Balance     End of Session PT - End of Session Equipment Utilized During Treatment: Gait belt Activity Tolerance: Patient limited by fatigue Patient left: in chair;with call bell/phone within reach;with family/visitor present Nurse Communication: Mobility status (needed inhaler and ready for DC.)   GP     [name] 01/20/2014, 10:03 AM

## 2014-01-20 NOTE — Discharge Instructions (Signed)
° °Dr. Frank Aluisio °Total Joint Specialist °[facility] °[address] °[place], [place] [address] °[phone] ° °TOTAL KNEE REPLACEMENT POSTOPERATIVE DIRECTIONS ° ° ° °Knee Rehabilitation, Guidelines Following Surgery  °Results after knee surgery are often greatly improved when you follow the exercise, range of motion and muscle strengthening exercises prescribed by your doctor. Safety measures are also important to protect the knee from further injury. Any time any of these exercises cause you to have increased pain or swelling in your knee joint, decrease the amount until you are comfortable again and slowly increase them. If you have problems or questions, call your caregiver or physical therapist for advice.  ° °HOME CARE INSTRUCTIONS  °Remove items at home which could result in a fall. This includes throw rugs or furniture in walking pathways.  °Continue medications as instructed at time of discharge. °You may have some home medications which will be placed on hold until you complete the course of blood thinner medication.  °You may start showering once you are discharged home but do not submerge the incision under water. Just pat the incision dry and apply a dry gauze dressing on daily. °Walk with walker as instructed.  °You may resume a sexual relationship in one month or when given the OK by  your doctor.  °· Use walker as long as suggested by your caregivers. °· Avoid periods of inactivity such as sitting longer than an hour when not asleep. This helps prevent blood clots.  °You may put full weight on your legs and walk as much as is comfortable.  °You may return to work once you are cleared by your doctor.  °Do not drive a car for 6 weeks or until released by you surgeon.  °· Do not drive while taking narcotics.  °Wear the elastic stockings for three weeks following surgery during the day but you may remove then at night. °Make sure you keep all of your appointments after your  operation with all of your doctors and caregivers. You should call the office at the above phone number and make an appointment for approximately two weeks after the date of your surgery. °Change the dressing daily and reapply a dry dressing each time. °Please pick up a stool softener and laxative for home use as long as you are requiring pain medications. °· Continue to use ice on the knee for pain and swelling from surgery. You may notice swelling that will progress down to the foot and ankle.  This is normal after surgery.  Elevate the leg when you are not up walking on it.   °It is important for you to complete the blood thinner medication as prescribed by your doctor. °· Continue to use the breathing machine which will help keep your temperature down.  It is common for your temperature to cycle up and down following surgery, especially at night when you are not up moving around and exerting yourself.  The breathing machine keeps your lungs expanded and your temperature down. ° °RANGE OF MOTION AND STRENGTHENING EXERCISES  °Rehabilitation of the knee is important following a knee injury or an operation. After just a few days of immobilization, the muscles of the thigh which control the knee become weakened and shrink (atrophy). Knee exercises are designed to build up the tone and strength of the thigh muscles and to improve knee motion. Often times heat used for twenty to thirty minutes before working out will loosen up your tissues and help with improving the   range of motion but do not use heat for the first two weeks following surgery. These exercises can be done on a training (exercise) mat, on the floor, on a table or on a bed. Use what ever works the best and is most comfortable for you Knee exercises include:  Leg Lifts - While your knee is still immobilized in a splint or cast, you can do straight leg raises. Lift the leg to 60 degrees, hold for 3 sec, and slowly lower the leg. Repeat 10-20 times 2-3  times daily. Perform this exercise against resistance later as your knee gets better.  Quad and Hamstring Sets - Tighten up the muscle on the front of the thigh (Quad) and hold for 5-10 sec. Repeat this 10-20 times hourly. Hamstring sets are done by pushing the foot backward against an object and holding for 5-10 sec. Repeat as with quad sets.  A rehabilitation program following serious knee injuries can speed recovery and prevent re-injury in the future due to weakened muscles. Contact your doctor or a physical therapist for more information on knee rehabilitation.   SKILLED REHAB INSTRUCTIONS: If the patient is transferred to a skilled rehab facility following release from the hospital, a list of the current medications will be sent to the facility for the patient to continue.  When discharged from the skilled rehab facility, please have the facility set up the patient's [place] prior to being released. Also, the skilled facility will be responsible for providing the patient with their medications at time of release from the facility to include their pain medication, the muscle relaxants, and their blood thinner medication. If the patient is still at the rehab facility at time of the two week follow up appointment, the skilled rehab facility will also need to assist the patient in arranging follow up appointment in our office and any transportation needs.  MAKE SURE YOU:  Understand these instructions.  Will watch your condition.  Will get help right away if you are not doing well or get worse.    Pick up stool softner and laxative for home. Do not submerge incision under water. May shower. Continue to use ice for pain and swelling from surgery.  Take Coumadin for three weeks for postoperative protocol and then the patient may resume their previous Coumadin home regimen.  The dose may need to be adjusted based upon the INR.  Please follow the INR and titrate Coumadin dose for  a therapeutic range between 2.0 and 3.0 INR.  After completing the three weeks of Coumadin, the patient may resume their previous Coumadin home regimen.  Continue Lovenox injections for the next three days and then discontinue.

## 2014-04-05 DIAGNOSIS — K8020 Calculus of gallbladder without cholecystitis without obstruction: Secondary | ICD-10-CM

## 2014-04-05 HISTORY — DX: Calculus of gallbladder without cholecystitis without obstruction: K80.20

## 2014-04-16 ENCOUNTER — Ambulatory Visit
Admission: RE | Admit: 2014-04-16 | Discharge: 2014-04-16 | Disposition: A | Discharge status: Home / Self Care | Payer: Medicare Other | Source: Ambulatory Visit | Attending: Family Medicine | Admitting: Family Medicine

## 2014-04-16 ENCOUNTER — Other Ambulatory Visit: Payer: Self-pay | Admitting: Family Medicine

## 2014-04-16 DIAGNOSIS — R0602 Shortness of breath: Secondary | ICD-10-CM

## 2014-04-16 DIAGNOSIS — Z86711 Personal history of pulmonary embolism: Secondary | ICD-10-CM

## 2014-04-16 MED ORDER — IOHEXOL 350 MG/ML SOLN
125.0000 mL | Freq: Once | INTRAVENOUS | Status: AC | PRN
  Administered 2014-04-16: 125 mL via INTRAVENOUS

## 2014-05-13 ENCOUNTER — Institutional Professional Consult (permissible substitution): Payer: Medicare Other | Admitting: Emergency Medicine

## 2014-06-08 ENCOUNTER — Ambulatory Visit (INDEPENDENT_AMBULATORY_CARE_PROVIDER_SITE_OTHER): Payer: Medicare Other | Admitting: Pulmonary Disease

## 2014-06-08 ENCOUNTER — Encounter: Payer: Self-pay | Admitting: Pulmonary Disease

## 2014-06-08 VITALS — BP 118/80 | HR 64 | Temp 98.4°F | Ht 71.0 in | Wt 231.2 lb

## 2014-06-08 DIAGNOSIS — R0600 Dyspnea, unspecified: Secondary | ICD-10-CM

## 2014-06-08 DIAGNOSIS — R0989 Other specified symptoms and signs involving the circulatory and respiratory systems: Secondary | ICD-10-CM

## 2014-06-08 DIAGNOSIS — R0609 Other forms of dyspnea: Secondary | ICD-10-CM

## 2014-06-08 LAB — PULMONARY FUNCTION TEST
DL/VA % pred: 83 %
DL/VA: 3.84 ml/min/mmHg/L
DLCO UNC % PRED: 78 %
DLCO unc: 25.27 ml/min/mmHg
FEF 25-75 PRE: 2.48 L/s
FEF 25-75 Post: 3.24 L/sec
FEF2575-%Change-Post: 30 %
FEF2575-%Pred-Post: 132 %
FEF2575-%Pred-Pre: 101 %
FEV1-%CHANGE-POST: 8 %
FEV1-%Pred-Post: 101 %
FEV1-%Pred-Pre: 93 %
FEV1-PRE: 3.03 L
FEV1-Post: 3.28 L
FEV1FVC-%Change-Post: 6 %
FEV1FVC-%PRED-PRE: 104 %
FEV6-%Change-Post: 2 %
FEV6-%PRED-POST: 97 %
FEV6-%Pred-Pre: 94 %
FEV6-PRE: 3.94 L
FEV6-Post: 4.04 L
FEV6FVC-%CHANGE-POST: 0 %
FEV6FVC-%PRED-PRE: 105 %
FEV6FVC-%Pred-Post: 106 %
FVC-%Change-Post: 1 %
FVC-%PRED-PRE: 90 %
FVC-%Pred-Post: 91 %
FVC-POST: 4.05 L
FVC-Pre: 3.98 L
POST FEV6/FVC RATIO: 100 %
Post FEV1/FVC ratio: 81 %
Pre FEV1/FVC ratio: 76 %
Pre FEV6/FVC Ratio: 99 %
RV % pred: 97 %
RV: 2.4 L
TLC % PRED: 97 %
TLC: 6.86 L

## 2014-06-08 NOTE — Progress Notes (Signed)
PFT doe today.

## 2014-06-08 NOTE — Assessment & Plan Note (Signed)
The patient has progressive dyspnea on exertion over the last 2 years that is greatly impacting his quality of life.  He has a history of a borderline positive methacholine challenge test, but really does not have a good history for asthma. He does note significant improvement in his breathing with as needed Combivent, but currently is not on an inhaled corticosteroid. A lot of his current symptoms do seem to fit with chronic air trapping and hyperinflation, but I am also concerned about his history of recurrent thromboembolic disease. I think he needs to have an echocardiogram to rule out pulmonary hypertension, and possibly a ventilation perfusion scan to evaluate for chronic thromboembolic disease. I have explained that a CT chest does not evaluate small chronic clot very well. I cannot exclude a component of cardiac disease as well. Finally, a lot of this may simply be his weight and conditioning, but this is a diagnosis of exclusion. Will do pulmonary function studies in addition to his echocardiogram to look for air trapping or frank airflow limitation.

## 2014-06-08 NOTE — Progress Notes (Signed)
   Subjective:    Patient ID: [name], male    DOB: Apr 09, 1941, 73 y.o.   MRN: [id number]  HPI The patient is a 73 year old male who I've been asked to see for dyspnea on exertion. The patient has noted slowly progressive dyspnea on exertion over the last 2 years, but denies any shortness of breath at rest. He will get winded walking up one flight of stairs, or bringing groceries in from the car. He describes a less than one block dyspnea on exertion at a moderate pace on flat ground, but pushes himself to go further. He tells me that he frequently feels a tightness in his chest that is like wearing a tight shirt or a band across his chest. He has an intermittent dry cough that seems to come and go, but is not overly significant. He has had no recent cardiac workup, and does note some mild swelling in his lower extremities by evening time. He has had a pulmonary evaluation in the past for recurrent pulmonary emboli, and currently is on chronic Coumadin for life. He has not had a recent echocardiogram to evaluate pulmonary artery pressures, nor has he had a ventilation perfusion scan to evaluate for chronic thromboembolic disease. He has had a recent CT chest that shows no acute pulmonary embolus, and no acute pulmonary process. He also has a history of a borderline positive methacholine challenge, with a 20% reduction in flows at a concentration between 11 and 12. This is certainly not diagnostic for asthma. He has been tried on multiple medications for asthma, and most recently he felt dulera made his breathing worse. He does use Combivent, and feels that it helps him quite a bit. His spouse has noticed wheezing at times, but from her description, this is classic upper airway pseudo wheezing. The patient's weight has been fairly stable from 2008.   Review of Systems  Constitutional: Negative for fever and unexpected weight change.  HENT: Negative for congestion, dental problem, ear pain,  nosebleeds, postnasal drip, rhinorrhea, sinus pressure, sneezing, sore throat and trouble swallowing.   Eyes: Negative for redness and itching.  Respiratory: Positive for cough and shortness of breath. Negative for chest tightness and wheezing.   Cardiovascular: Negative for palpitations and leg swelling.  Gastrointestinal: Negative for nausea and vomiting.  Genitourinary: Negative for dysuria.  Musculoskeletal: Negative for joint swelling.  Skin: Negative for rash.  Neurological: Negative for headaches.  Hematological: Does not bruise/bleed easily.  Psychiatric/Behavioral: Negative for dysphoric mood. The patient is not nervous/anxious.        Objective:   Physical Exam Constitutional:  Overweight male, no acute distress  HENT:  Nares patent without discharge, but deviated septum to right with narrowing  Oropharynx without exudate, palate and uvula are moderately elongated.  Eyes:  Perrla, eomi, no scleral icterus  Neck:  No JVD, no TMG  Cardiovascular:  Normal rate, regular rhythm, no rubs or gallops.  No murmurs        Intact distal pulses  Pulmonary :  Normal breath sounds, no stridor or respiratory distress   No rales, rhonchi, or wheezing  Abdominal:  Soft, nondistended, bowel sounds present.  No tenderness noted.   Musculoskeletal:  No significant lower extremity edema noted.  Lymph Nodes:  No cervical lymphadenopathy noted  Skin:  No cyanosis noted  Neurologic:  Alert, appropriate, moves all 4 extremities without obvious deficit.         Assessment & Plan:

## 2014-06-08 NOTE — Patient Instructions (Signed)
Will schedule for PFT's as well as an echocardiogram to look for pulmonary hypertension Work on weight reduction and conditioning. Will discuss further treatment of your asthma after the above.

## 2014-06-14 ENCOUNTER — Ambulatory Visit (HOSPITAL_COMMUNITY)
Admission: RE | Admit: 2014-06-14 | Discharge: 2014-06-14 | Disposition: A | Discharge status: Home / Self Care | Payer: Medicare Other | Source: Ambulatory Visit | Attending: Cardiovascular Disease | Admitting: Cardiovascular Disease

## 2014-06-14 DIAGNOSIS — R0609 Other forms of dyspnea: Secondary | ICD-10-CM | POA: Insufficient documentation

## 2014-06-14 DIAGNOSIS — I059 Rheumatic mitral valve disease, unspecified: Secondary | ICD-10-CM

## 2014-06-14 DIAGNOSIS — R0989 Other specified symptoms and signs involving the circulatory and respiratory systems: Secondary | ICD-10-CM | POA: Diagnosis not present

## 2014-06-14 DIAGNOSIS — R0600 Dyspnea, unspecified: Secondary | ICD-10-CM

## 2014-06-14 NOTE — Progress Notes (Signed)
2D Echocardiogram Complete.  06/14/2014   Norah Devin [place], RDCS

## 2014-06-15 ENCOUNTER — Telehealth: Payer: Self-pay | Admitting: Pulmonary Disease

## 2014-06-15 DIAGNOSIS — R0609 Other forms of dyspnea: Principal | ICD-10-CM

## 2014-06-15 DIAGNOSIS — R0600 Dyspnea, unspecified: Secondary | ICD-10-CM

## 2014-06-15 NOTE — Telephone Encounter (Signed)
Result note in Jennifer's box for 2D Echo results: Result Note     Please let pt know that his echo shows only mild pulmonary htn, and the right side of his heart is a tiny bit big, but is working normally. I do not see anything on breathing studies or echo to explain your shortness of breath. Would like to do lung scan that we discussed to evaluate for blood clots blocking a large number of small vessels in the lungs. Once this is done, would like to see you back to review everything and come up with a plan.    Ok to put in for V/Q scan. And OV.    Called spoke with patient and discussed 2D Echo results / recs as stated by [facility] above.  Pt voiced his understanding and denied any questions/concerns at this time.  Order placed for VQ scan and pt is aware he will need a follow up appt afterwards to discuss those results.  Nothing further needed at this time; will sign off.

## 2014-06-15 NOTE — Progress Notes (Signed)
Quick Note:  Pt aware per 8.11.15 phone note. VQ scan ordered. ______

## 2014-06-16 ENCOUNTER — Other Ambulatory Visit: Payer: Self-pay | Admitting: Pulmonary Disease

## 2014-06-16 DIAGNOSIS — R0600 Dyspnea, unspecified: Secondary | ICD-10-CM

## 2014-06-16 DIAGNOSIS — R0609 Other forms of dyspnea: Principal | ICD-10-CM

## 2014-06-18 ENCOUNTER — Ambulatory Visit (HOSPITAL_COMMUNITY)
Admission: RE | Admit: 2014-06-18 | Discharge: 2014-06-18 | Disposition: A | Discharge status: Home / Self Care | Payer: Medicare Other | Source: Ambulatory Visit | Attending: Pulmonary Disease | Admitting: Pulmonary Disease

## 2014-06-18 DIAGNOSIS — R0789 Other chest pain: Secondary | ICD-10-CM | POA: Diagnosis present

## 2014-06-18 DIAGNOSIS — R0600 Dyspnea, unspecified: Secondary | ICD-10-CM

## 2014-06-18 DIAGNOSIS — R0989 Other specified symptoms and signs involving the circulatory and respiratory systems: Secondary | ICD-10-CM | POA: Insufficient documentation

## 2014-06-18 DIAGNOSIS — R0609 Other forms of dyspnea: Secondary | ICD-10-CM | POA: Insufficient documentation

## 2014-06-18 DIAGNOSIS — Z86711 Personal history of pulmonary embolism: Secondary | ICD-10-CM | POA: Insufficient documentation

## 2014-06-18 MED ORDER — TECHNETIUM TC 99M DIETHYLENETRIAME-PENTAACETIC ACID: 40.0000 | Freq: Once | INTRAVENOUS | Status: DC | PRN

## 2014-06-18 MED ORDER — TECHNETIUM TO 99M ALBUMIN AGGREGATED
5.0000 | Freq: Once | INTRAVENOUS | Status: AC | PRN
  Administered 2014-06-18: 5 via INTRAVENOUS

## 2014-06-25 ENCOUNTER — Encounter: Payer: Self-pay | Admitting: Pulmonary Disease

## 2014-06-25 ENCOUNTER — Ambulatory Visit (INDEPENDENT_AMBULATORY_CARE_PROVIDER_SITE_OTHER): Payer: Medicare Other | Admitting: Pulmonary Disease

## 2014-06-25 VITALS — BP 130/80 | HR 66 | Temp 98.4°F | Ht 71.0 in | Wt 234.0 lb

## 2014-06-25 DIAGNOSIS — R0989 Other specified symptoms and signs involving the circulatory and respiratory systems: Secondary | ICD-10-CM

## 2014-06-25 DIAGNOSIS — R0600 Dyspnea, unspecified: Secondary | ICD-10-CM

## 2014-06-25 DIAGNOSIS — R0609 Other forms of dyspnea: Secondary | ICD-10-CM

## 2014-06-25 NOTE — Progress Notes (Signed)
   Subjective:    Patient ID: [name], male    DOB: [birth year], 73 y.o.   MRN: [id number]  HPI Patient comes in today for review of his various testing for workup of dyspnea on exertion. I have reviewed his PFTs, echo, as well as ventilation perfusion scan with him in detail, and answered all of his questions. We have had a long conversation about the results and whether or not he needs further workup.   Review of Systems  Constitutional: Negative for fever and unexpected weight change.  HENT: Negative for congestion, dental problem, ear pain, nosebleeds, postnasal drip, rhinorrhea, sinus pressure, sneezing, sore throat and trouble swallowing.   Eyes: Negative for redness and itching.  Respiratory: Positive for shortness of breath. Negative for cough, chest tightness and wheezing.   Cardiovascular: Negative for palpitations and leg swelling.  Gastrointestinal: Negative for nausea and vomiting.  Genitourinary: Negative for dysuria.  Musculoskeletal: Negative for joint swelling.  Skin: Negative for rash.  Neurological: Negative for headaches.  Hematological: Does not bruise/bleed easily.  Psychiatric/Behavioral: Negative for dysphoric mood. The patient is not nervous/anxious.        Objective:   Physical Exam Overweight male in no acute distress Nose without purulence or discharge noted Neck without lymphadenopathy or thyromegaly Lower extremities with minimal edema, no cyanosis Alert and oriented, moves all 4 extremities.       Assessment & Plan:

## 2014-06-25 NOTE — Patient Instructions (Signed)
Continue on combivent as needed for rescue Will add qvar 27mcg  2 inhalations each am and pm for 4 weeks only.  Rinse mouth well after using.  Please call and give me some feedback with your response. Work on weight loss and conditioning, but call if your breathing is worsening.  We can check a stress test for coronary disease if your symptoms persist.  followup with me as needed if things due not improve or if worsen.

## 2014-06-25 NOTE — Assessment & Plan Note (Addendum)
+  PE by CT scan 2008 (pt states he also has had PE in the 80's) LLE doppler negative for DVT in 2012 H/o borderline +methacholine challenge testing (at concentration of approx 11.75) CT chest 04/2014:  No PE or other abnormality V/q 2015:  No perfusion defects Echo 2015:  Mild elevation in PA pressures, and mild increased RV size (not enough to cause his sob)   The patient has had a very thorough workup for his dyspnea on exertion so far.  He has no pulmonary embolus or parenchymal disease by CT chest, his VQ scan is without any perfusion abnormalities, and his echo shows only mild pulmonary hypertension with increased RV size and is also mild. There is nothing on the studies which would indicate why he may be short of breath. He is also had a recent PFTs that showed no airflow obstruction, no restriction, and essentially normal diffusion capacity. The question has been raised by a borderline positive methacholine challenge test in the past, but any dilution over 10 with a positive result may not be do to asthma. He has been tried on inhaled corticosteroids in the form of a LABA/ICS, and did not see a big difference. He thought Flovent helped him in the distant past, but became too expensive. I really do not think asthma is the cause of his dyspnea on exertion, but will give him a trial of Qvar over the next 4 weeks to see if he sees a difference. I have asked him to continue on Combivent only as a rescue inhaler. I suspect his dyspnea on exertion is secondary to his weight and conditioning, but I cannot exclude the possibility of coronary disease. At at this point, he would like to work on weight and conditioning before he considers a noninvasive stress test. He will let me know if he does not do well over the upcoming months.  Time spent with the pt reviewing the above was 64min

## 2014-06-29 ENCOUNTER — Other Ambulatory Visit (HOSPITAL_COMMUNITY): Payer: Self-pay | Admitting: Family Medicine

## 2014-06-29 ENCOUNTER — Encounter: Payer: Self-pay | Admitting: Internal Medicine

## 2014-06-29 ENCOUNTER — Encounter (INDEPENDENT_AMBULATORY_CARE_PROVIDER_SITE_OTHER): Payer: Medicare Other | Admitting: Ophthalmology

## 2014-06-29 DIAGNOSIS — H43819 Vitreous degeneration, unspecified eye: Secondary | ICD-10-CM

## 2014-06-29 DIAGNOSIS — H34239 Retinal artery branch occlusion, unspecified eye: Secondary | ICD-10-CM

## 2014-06-29 DIAGNOSIS — H34219 Partial retinal artery occlusion, unspecified eye: Secondary | ICD-10-CM

## 2014-06-29 DIAGNOSIS — H349 Unspecified retinal vascular occlusion: Secondary | ICD-10-CM

## 2014-06-29 DIAGNOSIS — H2510 Age-related nuclear cataract, unspecified eye: Secondary | ICD-10-CM

## 2014-07-01 ENCOUNTER — Encounter: Payer: Self-pay | Admitting: *Deleted

## 2014-07-02 ENCOUNTER — Ambulatory Visit (INDEPENDENT_AMBULATORY_CARE_PROVIDER_SITE_OTHER): Payer: Medicare Other | Admitting: Neurology

## 2014-07-02 ENCOUNTER — Encounter: Payer: Self-pay | Admitting: *Deleted

## 2014-07-02 ENCOUNTER — Telehealth: Payer: Self-pay | Admitting: *Deleted

## 2014-07-02 ENCOUNTER — Encounter: Payer: Self-pay | Admitting: Neurology

## 2014-07-02 VITALS — BP 140/58 | HR 66 | Temp 98.0°F | Resp 20 | Ht 71.0 in | Wt 236.2 lb

## 2014-07-02 DIAGNOSIS — I6350 Cerebral infarction due to unspecified occlusion or stenosis of unspecified cerebral artery: Secondary | ICD-10-CM

## 2014-07-02 DIAGNOSIS — H3410 Central retinal artery occlusion, unspecified eye: Secondary | ICD-10-CM

## 2014-07-02 DIAGNOSIS — I639 Cerebral infarction, unspecified: Secondary | ICD-10-CM | POA: Insufficient documentation

## 2014-07-02 DIAGNOSIS — H3411 Central retinal artery occlusion, right eye: Secondary | ICD-10-CM

## 2014-07-02 NOTE — Patient Instructions (Signed)
1.  There are no specific changes in management at this time since you are already on Coumadin.  I wouldn't add aspirin as studies have not shown it to add more benefit, just increased risk of bleeding. 2.  We will check fasting lipid panel and Hgb A1c, since these tests may change management 3.  Routine exercise and Mediterranean diet 4.  If you experience any symptoms concerning for stroke (vision loss, facial droop, one sided weakness) go immediately to the ER 5.  Follow up in 3 months or as needed.

## 2014-07-02 NOTE — Telephone Encounter (Signed)
Hematologist seen the past Dr Gray Bernhardt [facility]

## 2014-07-02 NOTE — Progress Notes (Addendum)
NEUROLOGY CONSULTATION NOTE  KELLEY KNOTH MRN: [id number] DOB: 1941-05-31  Referring provider: Dr. Dema Severin Primary care provider: Dr. 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  Records personally reviewed.  He is accompanied by his wife.  Last week, he developed sharp right temporal head pain, associated with cloudiness of the vision in his right eye.  He was found to have a right retinal artery occlusion.  The ophthalmologist reportedly thought it looked non-artertic.  He had an MRI of the brain without contrast performed on 06/30/14, which revealed three subcortical acute infarcts in the right parietal lobe.  MRA of the head was normal.  He underwent a stroke workup.  Carotid duplex showed bilateral atherosclerotic plaque but no hemodynamically significant stenosis in the internal carotids.  Sed rate was 11, INR was 3.1, CRP was 2.5.  A recent 2D Echocardiogram was performed on 06/14/14, which revealed a normal LVEF of 55-60% with no wall motion abnormalities or cardiac source of emboli.  He is compliant with his Coumadin.  Of note, recent CT scan of the chest performed in June 2015 was negative for recurrent PE.  He has been evaluated by a hematologist in the past, with hypercoagulable panel not too revealing for a specific thrombophilia.  The headache is now dull and he takes Tylenol for it.  He has history of DVT and PE on Coumadin with supratherapeutic INR.  He has a history of skull fracture as a child, in which he suffered from chronic headaches up until his early 36s.  He had a neck injury at age 81, in which he had a myelopathy and has residual hyperreflexia and numbness in the right leg.  He has arthritis with  bilateral knee surgeries.  PAST MEDICAL HISTORY: Past Medical History  Diagnosis Date  . History of pulmonary embolism   . GERD (gastroesophageal reflux disease)   . S/P dilatation of esophageal stricture   . Lung nodule     BENIGN RIGHT UPPER--  CHEST CT 08-22-2011  . Anticoagulated on warfarin   . Arthritis     KNEES, NECK, SHOULDERS, HANDS  . Gout, arthritis     BILATERAL GREAT TOE-- PER PT STABLE  . Anxiety   . Asthma   . OSA (obstructive sleep apnea)     NO CPAP -states dr said possibly related to asthma  . Cancer     skin cancer-  2 basal cell, 1 squamous cell  . H/O transfusion of packed red blood cells     as a child/ s/p tonsillectomy    PAST SURGICAL HISTORY: Past Surgical History  Procedure Laterality Date  . Total knee arthroplasty Left 10-15-2008  . Wrist ganglion excision Right 1964  . Knee arthroscopy Right   . Carpal tunnel release Right 2010  . Tonsillectomy  AGE 12  . Shoulder arthroscopy Right   . Knee arthroscopy Right   . Joint replacement    . Total knee arthroplasty Right 01/18/2014    Procedure: RIGHT TOTAL KNEE ARTHROPLASTY;  Surgeon: Gearlean Alf, MD;  Location: [facility];  Service: Orthopedics;  Laterality: Right;    MEDICATIONS: Current Outpatient Prescriptions on File Prior to Visit  Medication Sig Dispense Refill  . acetaminophen (TYLENOL) 500 MG tablet  Take 1,000 mg by mouth 2 (two) times daily. And prn      . albuterol (PROVENTIL HFA;VENTOLIN HFA) 108 (90 BASE) MCG/ACT inhaler Inhale 2 puffs into the lungs every 6 (six) hours as needed for wheezing or shortness of breath.      [name] albuterol-ipratropium (COMBIVENT) 18-103 MCG/ACT inhaler Inhale 2 puffs into the lungs every 4 (four) hours as needed for wheezing or shortness of breath.      . allopurinol (ZYLOPRIM) 300 MG tablet Take 300 mg by mouth daily.      . Cimetidine (ACID REDUCER PO) Take 1 tablet by mouth at bedtime as needed (heart burn).       [name] escitalopram (LEXAPRO) 20 MG tablet  Take 20 mg by mouth every Saturday.       [name] LYRICA 75 MG capsule Take 1 capsule by mouth daily.      . methocarbamol (ROBAXIN) 500 MG tablet Take 1 tablet (500 mg total) by mouth every 6 (six) hours as needed for muscle spasms.  80 tablet  0  . warfarin (COUMADIN) 5 MG tablet Take 1 tablet (5 mg total) by mouth every evening. Take Coumadin for three weeks for postoperative protocol and then the patient may resume their previous Coumadin home regimen.  The dose may need to be adjusted based upon the INR.  Please follow the INR and titrate Coumadin dose for a therapeutic range between 2.0 and 3.0 INR.  After completing the three weeks of Coumadin, the patient may resume their previous Coumadin home regimen.  45 tablet  0  . zolpidem (AMBIEN) 10 MG tablet Take 1 tablet by mouth at bedtime as needed.       No current facility-administered medications on file prior to visit.    ALLERGIES: Allergies  Allergen Reactions  . Garlic Anaphylaxis  . Onion Anaphylaxis  . Codeine Other (See Comments)    "PASSES OUT"  . Novocain [Procaine] Nausea Only    FAMILY HISTORY: Family History  Problem Relation Age of Onset  . Deep vein thrombosis Father   . Stroke Brother   . Stroke Father   . Colon cancer Mother     SOCIAL HISTORY: History   Social History  . Marital Status: Married    Spouse Name: N/A    Number of Children: N/A  . Years of Education: N/A   Occupational History  . CNA    Social History Main Topics  . Smoking status: Former Smoker -- 1.00 packs/day for 25 years    Types: Cigarettes    Quit date: 07/13/1980  . Smokeless tobacco: Never Used  . Alcohol Use: Yes     Comment: RARE  . Drug Use: No  . Sexual Activity: Yes    Partners: Female   Other Topics Concern  . Not on file   Social History Narrative  . No narrative on file    REVIEW OF SYSTEMS: Constitutional: No fevers, chills, or sweats, no generalized fatigue, change in appetite Eyes: Some vision loss in right  eye. No double vision, eye pain Ear, nose and throat: No hearing loss, ear pain, nasal congestion, sore throat Cardiovascular: No chest pain, palpitations Respiratory:  No shortness of breath at rest or with exertion, wheezes GastrointestinaI: No nausea, vomiting, diarrhea, abdominal pain, fecal incontinence Genitourinary:  No dysuria, urinary retention or frequency Musculoskeletal:  No neck pain, back pain Integumentary: No rash, pruritus, skin lesions Neurological: as above Psychiatric: No depression, insomnia, anxiety Endocrine: No palpitations, fatigue, diaphoresis, mood swings, change  in appetite, change in weight, increased thirst Hematologic/Lymphatic:  No anemia, purpura, petechiae. Allergic/Immunologic: no itchy/runny eyes, nasal congestion, recent allergic reactions, rashes  PHYSICAL EXAM: Filed Vitals:   07/02/14 0734  BP: 140/58  Pulse: 66  Temp: 98 F (36.7 C)  Resp: 20   General: No acute distress Head:  Normocephalic/atraumatic Neck: supple, no paraspinal tenderness, full range of motion Back: No paraspinal tenderness Heart: regular rate and rhythm Lungs: Clear to auscultation bilaterally. Vascular: No carotid bruits. Neurological Exam: Mental status: alert and oriented to person, place, and time, recent and remote memory intact, fund of knowledge intact, attention and concentration intact, speech fluent and not dysarthric, language intact. Cranial nerves: CN I: not tested CN II: pupils equal, round and reactive to light, Superior field cut in right eye., fundi not visualized CN III, IV, VI:  full range of motion, no nystagmus, no ptosis CN V: facial sensation intact CN VII: upper and lower face symmetric CN VIII: hearing intact CN IX, X: gag intact, uvula midline CN XI: sternocleidomastoid and trapezius muscles intact CN XII: tongue midline Bulk & Tone: normal, no fasciculations. Motor: 5/5 throughout Sensation: Reduced pinprick and vibration sensation in  right lower extremity Deep Tendon Reflexes: DTRs 3+ throughout, right Babinski, left toes down Finger to nose testing: no dysmetria Heel to shin: no dysmetria Gait: normal station and stride.  Able to turn and walk in tandem. Romberg negative.  IMPRESSION: 1.  Lacunar infarcts involving the right parietal lobe.  Most likely due to thrombophilia.  Based on location, it could be small vessel disease but inclusion of retinal artery occlusion, it could be cardioembolic as well.  Also, he is already hypercoagulable. 2.  Non-arteritic retinal artery occlusion  PLAN: 1.  Further testing will include checking a fasting lipid panel and Hgb A1c, as results for these tests may potentially alter management.  All other tests looking for causes that would alter management have already been performed.  Otherwise, other testing such as Holter monitor, would not change management as he is already on anticoagulation.  Coumadin alone has not been shown to be inferior to Coumadin with aspirin, only increased risk for bleeding. 2.  Exercise and Mediterranean diet. 3.  He may return to work and drive, but he must always turn his head to the right to decrease degree of a blind spot. 4.  We discussed stroke symptoms and need to go to the ED immediately if he experiences any new onset of these symptoms. 5.  Follow up in 3 months.  45 minutes spent with patient and his wife, over 50% spent discussing etiology and further management.  Thank you for allowing me to take part in the care of this patient.  Metta Clines, DO  CC:  Harlan Stains, MD

## 2014-07-05 LAB — HEMOGLOBIN A1C
Hgb A1c MFr Bld: 5.5 % (ref <5.7)
Mean Plasma Glucose: 111 mg/dL (ref <117)

## 2014-07-05 LAB — LIPID PANEL
CHOL/HDL RATIO: 4.5 ratio
CHOLESTEROL: 186 mg/dL (ref 0–200)
HDL: 41 mg/dL (ref >39)
LDL Cholesterol: 103 mg/dL — ABNORMAL HIGH (ref 0–99)
TRIGLYCERIDES: 209 mg/dL — AB (ref <150)
VLDL: 42 mg/dL — ABNORMAL HIGH (ref 0–40)

## 2014-07-05 NOTE — Progress Notes (Signed)
Note faxed.

## 2014-07-13 ENCOUNTER — Telehealth: Payer: Self-pay | Admitting: Neurology

## 2014-07-13 NOTE — Telephone Encounter (Signed)
Please advise 

## 2014-07-13 NOTE — Telephone Encounter (Signed)
Pt following back up, he has previously provided some info regarding his hematologist. What is next step. CB# [phone] / Sherri S.

## 2014-07-13 NOTE — Telephone Encounter (Signed)
I didn't receive the notes from his hematologist.  But it really wouldn't change management as he is already on anticoagulation.

## 2014-07-14 ENCOUNTER — Telehealth: Payer: Self-pay | Admitting: *Deleted

## 2014-07-14 NOTE — Telephone Encounter (Signed)
Patient is aware of labs and prescription for simvistatin called to [facility]  10 mg 1 po q day # 30 with 1 refill and PCP Dr Dema Severin to follow up with refills

## 2014-07-14 NOTE — Telephone Encounter (Signed)
Message copied by Claudie Revering on Wed Jul 14, 2014 10:48 AM ------      Message from: JAFFE, ADAM R      Created: Wed Jul 14, 2014 10:07 AM       Mr. Wedin's LDL is 103.  Ideally, I would like it to be less than 100.  I would recommend starting a small dose of a cholesterol-lowering medication, such as simvastatin 10mg  daily.      ----- Message -----         From: [facility] Interface         Sent: 07/05/2014   2:27 PM           To: Dudley Major, DO                   ------

## 2014-07-21 ENCOUNTER — Encounter: Payer: Self-pay | Admitting: Pulmonary Disease

## 2014-07-21 ENCOUNTER — Telehealth: Payer: Self-pay | Admitting: Neurology

## 2014-07-21 MED ORDER — BECLOMETHASONE DIPROPIONATE 80 MCG/ACT IN AERS: 2.0000 | INHALATION_SPRAY | Freq: Two times a day (BID) | RESPIRATORY_TRACT | Status: DC

## 2014-07-21 NOTE — Telephone Encounter (Signed)
Rx refilled to [facility] [place]  QVAR 67mcg 2 puff BID x zero refills Nothing further needed.

## 2014-07-21 NOTE — Telephone Encounter (Signed)
If he feels the qvar may have really helped, do not mind giving him another 4 week trial.  #1, no fills.

## 2014-07-21 NOTE — Telephone Encounter (Signed)
Dr Gwenette Greet, patient e-mailing with feedback of medication started at last [place]. Please advise if you wish to continue patient on therapy with QVAR.  Reports good tolerance and good control on this inhaler.  Pt also would like to give an FYI that they have recently experienced a stroke?   Patient Instructions     Continue on combivent as needed for rescue  Will add qvar 72mcg 2 inhalations each am and pm for 4 weeks only. Rinse mouth well after using. Please call and give me some feedback with your response.  Work on weight loss and conditioning, but call if your breathing is worsening. We can check a stress test for coronary disease if your symptoms persist.  followup with me as needed if things due not improve or if worsen.   Please advise. Thanks.

## 2014-07-21 NOTE — Telephone Encounter (Signed)
Pt to see if we have gotten his hematology records yet. Wanted to check back in on this. CB# [phone] / Sherri S.

## 2014-07-22 ENCOUNTER — Telehealth: Payer: Self-pay | Admitting: Oncology

## 2014-07-22 NOTE — Telephone Encounter (Signed)
Faxed pt medical records to [phone]

## 2014-07-30 ENCOUNTER — Encounter (INDEPENDENT_AMBULATORY_CARE_PROVIDER_SITE_OTHER): Payer: Medicare Other | Admitting: Ophthalmology

## 2014-07-30 DIAGNOSIS — H43819 Vitreous degeneration, unspecified eye: Secondary | ICD-10-CM

## 2014-07-30 DIAGNOSIS — H2510 Age-related nuclear cataract, unspecified eye: Secondary | ICD-10-CM

## 2014-07-30 DIAGNOSIS — H34219 Partial retinal artery occlusion, unspecified eye: Secondary | ICD-10-CM

## 2014-07-30 DIAGNOSIS — H34239 Retinal artery branch occlusion, unspecified eye: Secondary | ICD-10-CM

## 2014-08-10 ENCOUNTER — Ambulatory Visit (HOSPITAL_COMMUNITY): Admission: RE | Admit: 2014-08-10 | Payer: Medicare Other | Source: Ambulatory Visit

## 2014-08-10 ENCOUNTER — Other Ambulatory Visit (HOSPITAL_COMMUNITY): Payer: Medicare Other

## 2014-08-10 ENCOUNTER — Ambulatory Visit (HOSPITAL_COMMUNITY): Payer: Medicare Other

## 2014-08-10 ENCOUNTER — Encounter (HOSPITAL_COMMUNITY): Admission: RE | Payer: Self-pay | Source: Ambulatory Visit

## 2014-08-10 SURGERY — RADIOLOGY WITH ANESTHESIA
Anesthesia: General

## 2014-09-21 ENCOUNTER — Encounter (INDEPENDENT_AMBULATORY_CARE_PROVIDER_SITE_OTHER): Payer: Medicare Other | Admitting: Ophthalmology

## 2014-09-21 DIAGNOSIS — H43813 Vitreous degeneration, bilateral: Secondary | ICD-10-CM

## 2014-09-21 DIAGNOSIS — H34231 Retinal artery branch occlusion, right eye: Secondary | ICD-10-CM

## 2014-09-21 DIAGNOSIS — H2513 Age-related nuclear cataract, bilateral: Secondary | ICD-10-CM

## 2014-09-22 ENCOUNTER — Ambulatory Visit (INDEPENDENT_AMBULATORY_CARE_PROVIDER_SITE_OTHER): Payer: Medicare Other | Admitting: Neurology

## 2014-09-22 ENCOUNTER — Encounter: Payer: Self-pay | Admitting: Neurology

## 2014-09-22 VITALS — BP 124/72 | HR 78 | Temp 98.6°F | Resp 16 | Wt 233.3 lb

## 2014-09-22 DIAGNOSIS — R519 Headache, unspecified: Secondary | ICD-10-CM

## 2014-09-22 DIAGNOSIS — I639 Cerebral infarction, unspecified: Secondary | ICD-10-CM

## 2014-09-22 DIAGNOSIS — R51 Headache: Secondary | ICD-10-CM

## 2014-09-22 DIAGNOSIS — H3411 Central retinal artery occlusion, right eye: Secondary | ICD-10-CM

## 2014-09-22 MED ORDER — TOPIRAMATE 25 MG PO TABS: 25.0000 mg | ORAL_TABLET | Freq: Every day | ORAL | Status: DC

## 2014-09-22 NOTE — Patient Instructions (Signed)
1.  For the headaches, I will prescribe you topiramate 25mg  at bedtime.  Possible side effects include: impaired thinking, sedation, paresthesias (numbness and tingling) and weight loss.  It may cause dehydration and there is a small risk for kidney stones, so make sure to stay hydrated with water during the day.  There is also a very small risk for glaucoma, so if you notice any change in your vision while taking this medication, see an ophthalmologist.   2.  Limit use of Tylenol (or any pain reliever) to no more than 2 days out of the week to prevent rebound headache. 3.  Call in 4 weeks with update and we can adjust dose of topiramate if needed. 4.  Follow up in 3 months.

## 2014-09-22 NOTE — Progress Notes (Signed)
NEUROLOGY FOLLOW UP OFFICE NOTE  Ezel Vallone [name] [id number]  HISTORY OF PRESENT ILLNESS: [name] is a 73 year old right-handed man with chronic kidney disease, asthma, hypogonadism, gout, and history of DVT and PE on Coumadin, basal cell skin cancer on right elbow, and squamous cell carcinoma who follows up for right parietal lacunar infarct and right non-arteritic retinal artery occlusion.  Labs reviewed.  UPDATE: He underwent further stroke workup.  2D echo from 06/14/14 showed EF 55-60% with grade I diastolic dysfunction.  Carotid doppler from 06/30/14 showed bilateral carotid atherosclerotic plaque but no hemodynamically significant stenosis.  It did reveal incidental thyroid nodularity.  Labs from 07/05/14 showed LDL was 103 and HGB A1c was 5.5.    He saw the retina specialist yesterday, who told him things are stable, and in fact, he has mildly improved vision.  He still has the right temporal headaches.  They are throbbing and constant.  It is usually 4/10 but can fluctuate to 7/10 about once or twice a week.  He takes Tylenol twice a day, which he has for years for arthritis.  HISTORY: In August, he developed sharp right temporal head pain, associated with cloudiness of the vision in his right eye.  He was found to have a right retinal artery occlusion.  The ophthalmologist reportedly thought it looked non-artertic.  He had an MRI of the brain without contrast performed on 06/30/14, which revealed three subcortical acute infarcts in the right parietal lobe.  MRA of the head was normal.  He underwent a stroke workup.  Carotid duplex showed bilateral atherosclerotic plaque but no hemodynamically significant stenosis in the internal carotids.  Sed rate was 11, INR was 3.1, CRP was 2.5.  A recent 2D Echocardiogram was performed on 06/14/14, which revealed a normal LVEF of 55-60% with no wall motion abnormalities or cardiac source of emboli.  He is compliant with his Coumadin.  Of note, recent  CT scan of the chest performed in June 2015 was negative for recurrent PE.  He has been evaluated by a hematologist in the past, with hypercoagulable panel not too revealing for a specific thrombophilia.  The headache is now dull and he takes Tylenol for it.  He has history of DVT and PE on Coumadin with supratherapeutic INR.  He has a history of skull fracture as a child, in which he suffered from chronic headaches up until his early 66s.  He had a neck injury at age 19, in which he had a myelopathy and has residual hyperreflexia and numbness in the right leg.  He has arthritis with bilateral knee surgeries.  PAST MEDICAL HISTORY: Past Medical History  Diagnosis Date  . History of pulmonary embolism   . GERD (gastroesophageal reflux disease)   . S/P dilatation of esophageal stricture   . Lung nodule     BENIGN RIGHT UPPER--  CHEST CT 08-22-2011  . Anticoagulated on warfarin   . Arthritis     KNEES, NECK, SHOULDERS, HANDS  . Gout, arthritis     BILATERAL GREAT TOE-- PER PT STABLE  . Anxiety   . Asthma   . OSA (obstructive sleep apnea)     NO CPAP -states dr said possibly related to asthma  . Cancer     skin cancer-  2 basal cell, 1 squamous cell  . H/O transfusion of packed red blood cells     as a child/ s/p tonsillectomy    MEDICATIONS: Current Outpatient Prescriptions on File Prior to Visit  Medication Sig Dispense Refill  . acetaminophen (TYLENOL) 500 MG tablet Take 1,000 mg by mouth 2 (two) times daily. And prn    . albuterol (PROVENTIL HFA;VENTOLIN HFA) 108 (90 BASE) MCG/ACT inhaler Inhale 2 puffs into the lungs every 6 (six) hours as needed for wheezing or shortness of breath.    [name] albuterol-ipratropium (COMBIVENT) 18-103 MCG/ACT inhaler Inhale 2 puffs into the lungs every 4 (four) hours as needed for wheezing or shortness of breath.    . allopurinol (ZYLOPRIM) 300 MG tablet Take 300 mg by mouth daily.    . beclomethasone (QVAR) 80 MCG/ACT inhaler Inhale 2 puffs into the  lungs 2 (two) times daily. 1 Inhaler 0  . Cimetidine (ACID REDUCER PO) Take 1 tablet by mouth at bedtime as needed (heart burn).     [name] escitalopram (LEXAPRO) 20 MG tablet Take 20 mg by mouth every Saturday.     [name] LYRICA 75 MG capsule Take 1 capsule by mouth daily.    . methocarbamol (ROBAXIN) 500 MG tablet Take 1 tablet (500 mg total) by mouth every 6 (six) hours as needed for muscle spasms. 80 tablet 0  . warfarin (COUMADIN) 5 MG tablet Take 1 tablet (5 mg total) by mouth every evening. Take Coumadin for three weeks for postoperative protocol and then the patient may resume their previous Coumadin home regimen.  The dose may need to be adjusted based upon the INR.  Please follow the INR and titrate Coumadin dose for a therapeutic range between 2.0 and 3.0 INR.  After completing the three weeks of Coumadin, the patient may resume their previous Coumadin home regimen. 45 tablet 0  . zolpidem (AMBIEN) 10 MG tablet Take 1 tablet by mouth at bedtime as needed.     No current facility-administered medications on file prior to visit.    ALLERGIES: Allergies  Allergen Reactions  . Garlic Anaphylaxis  . Onion Anaphylaxis  . Codeine Other (See Comments)    "PASSES OUT"  . Novocain [Procaine] Nausea Only    FAMILY HISTORY: Family History  Problem Relation Age of Onset  . Deep vein thrombosis Father   . Stroke Brother   . Stroke Father   . Colon cancer Mother     SOCIAL HISTORY: History   Social History  . Marital Status: Married    Spouse Name: N/A    Number of Children: N/A  . Years of Education: N/A   Occupational History  . CNA    Social History Main Topics  . Smoking status: Former Smoker -- 1.00 packs/day for 25 years    Types: Cigarettes    Quit date: 07/13/1980  . Smokeless tobacco: Never Used  . Alcohol Use: Yes     Comment: RARE  . Drug Use: No  . Sexual Activity:    Partners: Female   Other Topics Concern  . Not on file   Social History Narrative  . No  narrative on file    REVIEW OF SYSTEMS: Constitutional: No fevers, chills, or sweats, no generalized fatigue, change in appetite Eyes: No visual changes, double vision, eye pain Ear, nose and throat: No hearing loss, ear pain, nasal congestion, sore throat Cardiovascular: No chest pain, palpitations Respiratory:  No shortness of breath at rest or with exertion, wheezes GastrointestinaI: No nausea, vomiting, diarrhea, abdominal pain, fecal incontinence Genitourinary:  No dysuria, urinary retention or frequency Musculoskeletal:  No neck pain, back pain Integumentary: No rash, pruritus, skin lesions Neurological: as above Psychiatric: No depression, insomnia, anxiety Endocrine: No  palpitations, fatigue, diaphoresis, mood swings, change in appetite, change in weight, increased thirst Hematologic/Lymphatic:  No anemia, purpura, petechiae. Allergic/Immunologic: no itchy/runny eyes, nasal congestion, recent allergic reactions, rashes  PHYSICAL EXAM: Filed Vitals:   09/22/14 1358  BP: 124/72  Pulse: 78  Temp: 98.6 F (37 C)  Resp: 16   General: No acute distress Head:  Normocephalic/atraumatic Eyes:  Fundoscopic exam unremarkable without vessel changes, exudates, hemorrhages or papilledema. Neck: supple, no paraspinal tenderness, full range of motion Heart:  Regular rate and rhythm Lungs:  Clear to auscultation bilaterally Back: No paraspinal tenderness Neurological Exam: alert and oriented to person, place, and time. Attention span and concentration intact, recent and remote memory intact, fund of knowledge intact.  Speech fluent and not dysarthric, language intact.  Reduced vision in upper quadrants of ribght eye, otherwise CN II-XII intact. Fundi not able to be visualized.  Bulk and tone normal, muscle strength 5/5 throughout.  Sensation to light touch intact.  Deep tendon reflexes 2+ throughout.  Finger to nose testing intact.  Gait normal.  IMPRESSION: 1.  Right temporal headache,  complicated by medication-overuse. 2.  Lacunar infarcts involving the right parietal lobe.  Most likely due to thrombophilia.  Based on location, it could be small vessel disease but inclusion of retinal artery occlusion, it could be cardioembolic as well.  Also, he is already hypercoagulable. 3.  Non-arteritic retinal artery occlusion  PLAN: 1.  Will start topiramate 25mg  daily.  Side effects discussed.  Will call in 4 weeks with update. 2.  Advised to reduce use of tylenol or all pain relievers to no more than 2 days out of the week to prevent rebound headache. 3.  On anticoagulation. 4.  As LDL should be less than 100, recommend low-dose statin, such as simvastatin 10mg  5.  Follow up in 3 months.  Metta Clines, DO  CC:  Harlan Stains, MD

## 2014-09-27 ENCOUNTER — Encounter (INDEPENDENT_AMBULATORY_CARE_PROVIDER_SITE_OTHER): Payer: Medicare Other | Admitting: Ophthalmology

## 2014-09-29 ENCOUNTER — Other Ambulatory Visit: Payer: Self-pay | Admitting: Neurology

## 2014-10-04 ENCOUNTER — Ambulatory Visit: Payer: Medicare Other | Admitting: Neurology

## 2014-10-20 ENCOUNTER — Telehealth: Payer: Self-pay | Admitting: Neurology

## 2014-10-20 NOTE — Telephone Encounter (Signed)
Pt wants to talk to someone about how the medication is working [phone]

## 2014-10-22 ENCOUNTER — Other Ambulatory Visit: Payer: Self-pay | Admitting: *Deleted

## 2014-10-22 ENCOUNTER — Telehealth: Payer: Self-pay | Admitting: *Deleted

## 2014-10-22 MED ORDER — TOPIRAMATE 25 MG PO TABS: 50.0000 mg | ORAL_TABLET | Freq: Two times a day (BID) | ORAL | Status: DC

## 2014-10-22 MED ORDER — TOPIRAMATE 25 MG PO TABS: 25.0000 mg | ORAL_TABLET | Freq: Two times a day (BID) | ORAL | Status: DC

## 2014-10-22 NOTE — Telephone Encounter (Signed)
I would increase topamax to 50mg  at bedtime and call back in another 4 weeks with update.

## 2014-10-22 NOTE — Telephone Encounter (Signed)
Pt called/returning your call at 9:47AM. C/B [phone]

## 2014-10-22 NOTE — Telephone Encounter (Signed)
Patient is aware of increase of Topamax to 25 mg 1 PO bid

## 2014-10-22 NOTE — Telephone Encounter (Signed)
Pt called requesting a refill for TOPAMAX but with an increase since the previous dosage is not really helping.  Please call pt.  Pharmacy: [facility] on [address]  # [phone]

## 2014-10-22 NOTE — Telephone Encounter (Signed)
Patient called stating he is still having the headache but it has not gotten any worse he is asking if you want to increase it or leave at the same dose ? Tomorrow is his last tablet . Please advise

## 2014-11-03 ENCOUNTER — Encounter (HOSPITAL_COMMUNITY): Payer: Self-pay | Admitting: Emergency Medicine

## 2014-11-03 ENCOUNTER — Emergency Department (HOSPITAL_COMMUNITY): Payer: Medicare Other

## 2014-11-03 ENCOUNTER — Emergency Department (HOSPITAL_COMMUNITY)
Admission: EM | Admit: 2014-11-03 | Discharge: 2014-11-04 | Disposition: A | Discharge status: Home / Self Care | Payer: Medicare Other | Attending: Emergency Medicine | Admitting: Emergency Medicine

## 2014-11-03 DIAGNOSIS — Z8669 Personal history of other diseases of the nervous system and sense organs: Secondary | ICD-10-CM | POA: Diagnosis not present

## 2014-11-03 DIAGNOSIS — M1389 Other specified arthritis, multiple sites: Secondary | ICD-10-CM | POA: Diagnosis not present

## 2014-11-03 DIAGNOSIS — Z86711 Personal history of pulmonary embolism: Secondary | ICD-10-CM | POA: Diagnosis not present

## 2014-11-03 DIAGNOSIS — Z79899 Other long term (current) drug therapy: Secondary | ICD-10-CM | POA: Insufficient documentation

## 2014-11-03 DIAGNOSIS — Z7901 Long term (current) use of anticoagulants: Secondary | ICD-10-CM | POA: Diagnosis not present

## 2014-11-03 DIAGNOSIS — Z8673 Personal history of transient ischemic attack (TIA), and cerebral infarction without residual deficits: Secondary | ICD-10-CM | POA: Diagnosis not present

## 2014-11-03 DIAGNOSIS — J40 Bronchitis, not specified as acute or chronic: Secondary | ICD-10-CM

## 2014-11-03 DIAGNOSIS — F419 Anxiety disorder, unspecified: Secondary | ICD-10-CM | POA: Diagnosis not present

## 2014-11-03 DIAGNOSIS — R531 Weakness: Secondary | ICD-10-CM | POA: Diagnosis not present

## 2014-11-03 DIAGNOSIS — R110 Nausea: Secondary | ICD-10-CM | POA: Insufficient documentation

## 2014-11-03 DIAGNOSIS — Z8719 Personal history of other diseases of the digestive system: Secondary | ICD-10-CM | POA: Diagnosis not present

## 2014-11-03 DIAGNOSIS — J45901 Unspecified asthma with (acute) exacerbation: Secondary | ICD-10-CM | POA: Insufficient documentation

## 2014-11-03 DIAGNOSIS — R0602 Shortness of breath: Secondary | ICD-10-CM | POA: Diagnosis present

## 2014-11-03 DIAGNOSIS — Z7951 Long term (current) use of inhaled steroids: Secondary | ICD-10-CM | POA: Insufficient documentation

## 2014-11-03 DIAGNOSIS — Z85828 Personal history of other malignant neoplasm of skin: Secondary | ICD-10-CM | POA: Insufficient documentation

## 2014-11-03 DIAGNOSIS — M10071 Idiopathic gout, right ankle and foot: Secondary | ICD-10-CM | POA: Diagnosis not present

## 2014-11-03 DIAGNOSIS — M10072 Idiopathic gout, left ankle and foot: Secondary | ICD-10-CM | POA: Insufficient documentation

## 2014-11-03 DIAGNOSIS — Z87891 Personal history of nicotine dependence: Secondary | ICD-10-CM | POA: Diagnosis not present

## 2014-11-03 DIAGNOSIS — Z9889 Other specified postprocedural states: Secondary | ICD-10-CM | POA: Diagnosis not present

## 2014-11-03 HISTORY — DX: Cerebral infarction, unspecified: I63.9

## 2014-11-03 LAB — CBC
HCT: 42.1 % (ref 39.0–52.0)
Hemoglobin: 14.2 g/dL (ref 13.0–17.0)
MCH: 30.7 pg (ref 26.0–34.0)
MCHC: 33.7 g/dL (ref 30.0–36.0)
MCV: 90.9 fL (ref 78.0–100.0)
Platelets: 178 10*3/uL (ref 150–400)
RBC: 4.63 MIL/uL (ref 4.22–5.81)
RDW: 13.8 % (ref 11.5–15.5)
WBC: 7.8 10*3/uL (ref 4.0–10.5)

## 2014-11-03 LAB — I-STAT TROPONIN, ED: Troponin i, poc: 0 ng/mL (ref 0.00–0.08)

## 2014-11-03 MED ORDER — ALBUTEROL SULFATE (2.5 MG/3ML) 0.083% IN NEBU
5.0000 mg | INHALATION_SOLUTION | Freq: Once | RESPIRATORY_TRACT | Status: AC
  Administered 2014-11-04: 5 mg via RESPIRATORY_TRACT
  Filled 2014-11-03: qty 6

## 2014-11-03 MED ORDER — METHYLPREDNISOLONE SODIUM SUCC 125 MG IJ SOLR
125.0000 mg | Freq: Once | INTRAMUSCULAR | Status: AC
  Administered 2014-11-04: 125 mg via INTRAVENOUS
  Filled 2014-11-03: qty 2

## 2014-11-03 NOTE — ED Notes (Addendum)
Pt present with c/o SHOB with productive cough x 2 days, worse tonight. Pt presents nauseated, diaphoretic with epigastric pain worse with cough. A & O, pt has used home neb x 3 today with no relief.

## 2014-11-03 NOTE — ED Provider Notes (Signed)
CSN: [id number]     Arrival date & time 11/03/14  2259 History   First MD Initiated Contact with Patient 11/03/14 2317     Chief Complaint  Patient presents with  . Shortness of Breath      HPI  Patient p/w SOB, cough , fever. Sx began three days ago, and have progressed in spite of albuterol, otc cough meds. No CP, though there is mild HA associated w coughing. No v/d, though there is mild nausea.  Patient states that he takes all medication as directed, including Coumadin. Last INR was within normal range, 3/3.5.  Patient has increased target range due to history of PE. He denies edema, lower extremity changes, weight changes.     Past Medical History  Diagnosis Date  . History of pulmonary embolism   . GERD (gastroesophageal reflux disease)   . S/P dilatation of esophageal stricture   . Lung nodule     BENIGN RIGHT UPPER--  CHEST CT 08-22-2011  . Anticoagulated on warfarin   . Arthritis     KNEES, NECK, SHOULDERS, HANDS  . Gout, arthritis     BILATERAL GREAT TOE-- PER PT STABLE  . Anxiety   . Asthma   . OSA (obstructive sleep apnea)     NO CPAP -states dr said possibly related to asthma  . Cancer     skin cancer-  2 basal cell, 1 squamous cell  . H/O transfusion of packed red blood cells     as a child/ s/p tonsillectomy  . Stroke    Past Surgical History  Procedure Laterality Date  . Total knee arthroplasty Left 10-15-2008  . Wrist ganglion excision Right 1964  . Knee arthroscopy Right   . Carpal tunnel release Right 2010  . Tonsillectomy  AGE 73  . Shoulder arthroscopy Right   . Knee arthroscopy Right   . Joint replacement    . Total knee arthroplasty Right 01/18/2014    Procedure: RIGHT TOTAL KNEE ARTHROPLASTY;  Surgeon: Gearlean Alf, MD;  Location: [facility];  Service: Orthopedics;  Laterality: Right;   Family History  Problem Relation Age of Onset  . Deep vein thrombosis Father   . Stroke Brother   . Stroke Father   . Colon cancer Mother     History  Substance Use Topics  . Smoking status: Former Smoker -- 1.00 packs/day for 25 years    Types: Cigarettes    Quit date: 07/13/1980  . Smokeless tobacco: Never Used  . Alcohol Use: Yes     Comment: RARE    Review of Systems  Constitutional:       Per HPI, otherwise negative  HENT:       Per HPI, otherwise negative  Respiratory:       Per HPI, otherwise negative  Cardiovascular:       Per HPI, otherwise negative  Gastrointestinal: Positive for nausea. Negative for vomiting.  Endocrine:       Negative aside from HPI  Genitourinary:       Neg aside from HPI   Musculoskeletal:       Per HPI, otherwise negative  Skin: Negative.  Negative for color change and rash.  Neurological: Positive for weakness. Negative for syncope.      Allergies  Garlic; Onion; Codeine; and Novocain  Home Medications   Prior to Admission medications   Medication Sig Start Date End Date Taking? Authorizing Provider  acetaminophen (TYLENOL) 500 MG tablet Take 1,000 mg by mouth 2 (two) times  daily. And prn    Historical Provider, MD  albuterol (PROVENTIL HFA;VENTOLIN HFA) 108 (90 BASE) MCG/ACT inhaler Inhale 2 puffs into the lungs every 6 (six) hours as needed for wheezing or shortness of breath.    Historical Provider, MD  albuterol-ipratropium (COMBIVENT) 18-103 MCG/ACT inhaler Inhale 2 puffs into the lungs every 4 (four) hours as needed for wheezing or shortness of breath.    Historical Provider, MD  allopurinol (ZYLOPRIM) 300 MG tablet Take 300 mg by mouth daily.    Historical Provider, MD  beclomethasone (QVAR) 80 MCG/ACT inhaler Inhale 2 puffs into the lungs 2 (two) times daily. 07/21/14   Kathee Delton, MD  Cimetidine (ACID REDUCER PO) Take 1 tablet by mouth at bedtime as needed (heart burn).     Historical Provider, MD  escitalopram (LEXAPRO) 20 MG tablet Take 20 mg by mouth every Saturday.     Historical Provider, MD  LYRICA 75 MG capsule Take 1 capsule by mouth daily. 05/27/14    Historical Provider, MD  methocarbamol (ROBAXIN) 500 MG tablet Take 1 tablet (500 mg total) by mouth every 6 (six) hours as needed for muscle spasms. 01/20/14   Arlee Muslim, PA-C  topiramate (TOPAMAX) 25 MG tablet Take 1 tablet (25 mg total) by mouth at bedtime. 09/22/14   Adam Melvern Sample, DO  topiramate (TOPAMAX) 25 MG tablet Take 1 tablet (25 mg total) by mouth 2 (two) times daily. 10/22/14   Dudley Major, DO  warfarin (COUMADIN) 5 MG tablet Take 1 tablet (5 mg total) by mouth every evening. Take Coumadin for three weeks for postoperative protocol and then the patient may resume their previous Coumadin home regimen.  The dose may need to be adjusted based upon the INR.  Please follow the INR and titrate Coumadin dose for a therapeutic range between 2.0 and 3.0 INR.  After completing the three weeks of Coumadin, the patient may resume their previous Coumadin home regimen. 01/20/14   Arlee Muslim, PA-C  zolpidem (AMBIEN) 10 MG tablet Take 1 tablet by mouth at bedtime as needed. 05/20/14   Historical Provider, MD   BP 142/76 mmHg  Pulse 108  Temp(Src) 99 F (37.2 C) (Oral)  Resp 20  Ht 6' (1.829 m)  Wt 236 lb (107.049 kg)  BMI 32.00 kg/m2  SpO2 98% Physical Exam  Constitutional: He is oriented to person, place, and time. He appears well-developed. No distress.  HENT:  Head: Normocephalic and atraumatic.  Eyes: Conjunctivae and EOM are normal.  Cardiovascular: Normal rate and regular rhythm.   Pulmonary/Chest: Effort normal. No stridor. He has decreased breath sounds.  Abdominal: He exhibits no distension.  Musculoskeletal: He exhibits no edema.  Neurological: He is alert and oriented to person, place, and time.  Skin: Skin is warm and dry.  Psychiatric: He has a normal mood and affect.  Nursing note and vitals reviewed.   ED Course  Procedures (including critical care time) Labs Review Labs Reviewed  COMPREHENSIVE METABOLIC PANEL - Abnormal; Notable for the following:     Glucose, Bld 117 (*)    Creatinine, Ser 1.39 (*)    GFR calc non Af Amer 49 (*)    GFR calc Af Amer 57 (*)    All other components within normal limits  PROTIME-INR - Abnormal; Notable for the following:    Prothrombin Time 37.7 (*)    INR 3.79 (*)    All other components within normal limits  CBC  BRAIN NATRIURETIC PEPTIDE  I-STAT TROPOININ, ED  Imaging Review Dg Chest 2 View  11/03/2014   CLINICAL DATA:  Shortness of breath  EXAM: CHEST  2 VIEW  COMPARISON:  06/18/2014  FINDINGS: Normal heart size and mediastinal contours. No acute infiltrate or edema. No effusion or pneumothorax. No acute osseous findings.  IMPRESSION: No active cardiopulmonary disease.   Electronically Signed   By: Jorje Guild M.D.   On: 11/03/2014 23:50     EKG Interpretation   Date/Time:  Wednesday November 03 2014 23:10:19 EST Ventricular Rate:  111 PR Interval:  140 QRS Duration: 76 QT Interval:  327 QTC Calculation: 444 R Axis:   0 Text Interpretation:  Sinus tachycardia Posterior infarct, old Sinus  tachycardia Artifact Abnormal ekg Confirmed by Carmin Muskrat  MD ([id number])  on 11/03/2014 11:18:03 PM     1:42 AM O2- 99% ra, cardiac: 101 st, abnormal Patient feels better after treatment.  We discussed all findings at length, including therapeutic INR.  We also discussed the patient's elevated creatinine from baseline, encouraged fluid rehydration.   We discussed admit vs. D/c and the patient voiced preference for d/c / outpatient f/u.    MDM   Patient presents with ongoing cough, dyspnea. Patient has asthma.  Patient also has a history of PE, but today the patient has no hypoxia, lower extremity asymmetry, and his Coumadin level is supratherapeutic, all reassuring for the low suspicion of ongoing thrombolic disease. Patient presents substantially with albuterol therapy here, and given concern for bronchitis, accompanying asthma, patient was started on steroids, scheduled for the  dilators, discharged in stable condition.      Carmin Muskrat, MD 11/04/14 [phone]

## 2014-11-04 LAB — COMPREHENSIVE METABOLIC PANEL
ALBUMIN: 4.5 g/dL (ref 3.5–5.2)
ALK PHOS: 50 U/L (ref 39–117)
ALT: 17 U/L (ref 0–53)
AST: 32 U/L (ref 0–37)
Anion gap: 10 (ref 5–15)
BILIRUBIN TOTAL: 1 mg/dL (ref 0.3–1.2)
BUN: 15 mg/dL (ref 6–23)
CALCIUM: 8.7 mg/dL (ref 8.4–10.5)
CO2: 19 mmol/L (ref 19–32)
CREATININE: 1.39 mg/dL — AB (ref 0.50–1.35)
Chloride: 108 mEq/L (ref 96–112)
GFR calc Af Amer: 57 mL/min — ABNORMAL LOW (ref >90)
GFR, EST NON AFRICAN AMERICAN: 49 mL/min — AB (ref >90)
GLUCOSE: 117 mg/dL — AB (ref 70–99)
Potassium: 4.1 mmol/L (ref 3.5–5.1)
Sodium: 137 mmol/L (ref 135–145)
TOTAL PROTEIN: 7.6 g/dL (ref 6.0–8.3)

## 2014-11-04 LAB — BRAIN NATRIURETIC PEPTIDE: B Natriuretic Peptide: 28.2 pg/mL (ref 0.0–100.0)

## 2014-11-04 LAB — PROTIME-INR
INR: 3.79 — AB (ref 0.00–1.49)
Prothrombin Time: 37.7 seconds — ABNORMAL HIGH (ref 11.6–15.2)

## 2014-11-04 MED ORDER — AZITHROMYCIN 250 MG PO TABS
500.0000 mg | ORAL_TABLET | Freq: Once | ORAL | Status: AC
  Administered 2014-11-04: 500 mg via ORAL
  Filled 2014-11-04: qty 2

## 2014-11-04 MED ORDER — AZITHROMYCIN 250 MG PO TABS
250.0000 mg | ORAL_TABLET | Freq: Every day | ORAL | Status: AC
Start: 2014-11-04 — End: 2014-11-07

## 2014-11-04 NOTE — Discharge Instructions (Signed)
As discussed, it is important that you follow up with your physician for continued management of your condition.  Please be sure to take all medications as directed.  For the next two days, please be sure to take albuterol therapy every four hours.  You may then taper this therapy to suit your symptoms.  The antibiotic may change your Coumadin level.  Please be sure to have this rechecked Saturday and early next week.  If you develop new bleeding, be sure to return to the ED.  If you develop any new, or concerning changes in your condition, please return to the emergency department immediately.

## 2014-11-05 ENCOUNTER — Emergency Department (HOSPITAL_COMMUNITY): Payer: Medicare Other

## 2014-11-05 ENCOUNTER — Encounter (HOSPITAL_COMMUNITY): Payer: Self-pay | Admitting: *Deleted

## 2014-11-05 ENCOUNTER — Emergency Department (HOSPITAL_COMMUNITY)
Admission: EM | Admit: 2014-11-05 | Discharge: 2014-11-05 | Disposition: A | Discharge status: Home / Self Care | Payer: Medicare Other | Attending: Emergency Medicine | Admitting: Emergency Medicine

## 2014-11-05 DIAGNOSIS — Y929 Unspecified place or not applicable: Secondary | ICD-10-CM | POA: Insufficient documentation

## 2014-11-05 DIAGNOSIS — Z8673 Personal history of transient ischemic attack (TIA), and cerebral infarction without residual deficits: Secondary | ICD-10-CM | POA: Diagnosis not present

## 2014-11-05 DIAGNOSIS — J45909 Unspecified asthma, uncomplicated: Secondary | ICD-10-CM | POA: Insufficient documentation

## 2014-11-05 DIAGNOSIS — Y9389 Activity, other specified: Secondary | ICD-10-CM | POA: Insufficient documentation

## 2014-11-05 DIAGNOSIS — Z85828 Personal history of other malignant neoplasm of skin: Secondary | ICD-10-CM | POA: Diagnosis not present

## 2014-11-05 DIAGNOSIS — Z7952 Long term (current) use of systemic steroids: Secondary | ICD-10-CM | POA: Insufficient documentation

## 2014-11-05 DIAGNOSIS — Z86718 Personal history of other venous thrombosis and embolism: Secondary | ICD-10-CM | POA: Insufficient documentation

## 2014-11-05 DIAGNOSIS — Z87891 Personal history of nicotine dependence: Secondary | ICD-10-CM | POA: Insufficient documentation

## 2014-11-05 DIAGNOSIS — M1990 Unspecified osteoarthritis, unspecified site: Secondary | ICD-10-CM | POA: Insufficient documentation

## 2014-11-05 DIAGNOSIS — K219 Gastro-esophageal reflux disease without esophagitis: Secondary | ICD-10-CM | POA: Diagnosis not present

## 2014-11-05 DIAGNOSIS — W1839XA Other fall on same level, initial encounter: Secondary | ICD-10-CM | POA: Diagnosis not present

## 2014-11-05 DIAGNOSIS — Y998 Other external cause status: Secondary | ICD-10-CM | POA: Diagnosis not present

## 2014-11-05 DIAGNOSIS — Z79899 Other long term (current) drug therapy: Secondary | ICD-10-CM | POA: Insufficient documentation

## 2014-11-05 DIAGNOSIS — Z7901 Long term (current) use of anticoagulants: Secondary | ICD-10-CM | POA: Diagnosis not present

## 2014-11-05 DIAGNOSIS — S0990XA Unspecified injury of head, initial encounter: Secondary | ICD-10-CM

## 2014-11-05 DIAGNOSIS — R791 Abnormal coagulation profile: Secondary | ICD-10-CM | POA: Diagnosis not present

## 2014-11-05 DIAGNOSIS — F419 Anxiety disorder, unspecified: Secondary | ICD-10-CM | POA: Insufficient documentation

## 2014-11-05 DIAGNOSIS — Z792 Long term (current) use of antibiotics: Secondary | ICD-10-CM | POA: Diagnosis not present

## 2014-11-05 DIAGNOSIS — J209 Acute bronchitis, unspecified: Secondary | ICD-10-CM

## 2014-11-05 LAB — PROTIME-INR
INR: 4.58 — ABNORMAL HIGH (ref 0.00–1.49)
Prothrombin Time: 43.7 seconds — ABNORMAL HIGH (ref 11.6–15.2)

## 2014-11-05 MED ORDER — RAMELTEON 8 MG PO TABS: 8.0000 mg | ORAL_TABLET | Freq: Every evening | ORAL | Status: DC | PRN

## 2014-11-05 NOTE — ED Notes (Signed)
PT states that he was seen last night and diagnosed with Bronchitis; pt states that he doesn't normally take Ambien but has had trouble sleeping due to the coughing; pt states that he took an Ambien 10mg  12/30 around 2230 and then another one around 2:15 in the morning after leaving the ER; pt states that he took 2 more 10 mg Ambien prior to going to bed last pm; pt got up to go the bathroom and was stumbling; pt states that he struck his head on the bathroom wall; pt denies LOC but states that his PT has been elevated and his wife is concerned about bleeding

## 2014-11-05 NOTE — ED Notes (Signed)
MD at bedside.  EDPA PRESENT TO SPEAK WITH PT

## 2014-11-05 NOTE — ED Notes (Signed)
Pt ambulated independently with steady gait around nurses station and to BR without difficulty. Elmyra Ricks, [place] aware of pts ability to ambulate independently.

## 2014-11-05 NOTE — ED Notes (Signed)
Awake. Verbally responsive. A/O x4. Resp even and unlabored. No audible adventitious breath sounds noted. ABC's intact. Pt reported feeling lightheadedness at this time.

## 2014-11-05 NOTE — ED Provider Notes (Signed)
CSN: [id number]     Arrival date & time 11/05/14  0441 History   None    Chief Complaint  Patient presents with  . Fall     (Consider location/radiation/quality/duration/timing/severity/associated sxs/prior Treatment) HPI  [name] is a 74 y.o. male presenting for evaluation status post fall. Patient is anticoagulated with warfarin secondary to DVTs and multiple lacunar infarcts. Patient was seen and evaluated for acute bronchitis, his INR yesterday was 3.75. Patient has had difficulty sleeping and he took for Ambien. He states that he lost his balance and was stumbling against the wall he states he pain calmed against the walls in the hall and eventually slid down to the ground. This was not observed. There was no loss of consciousness, no headache, nausea or vomiting. Patient states he's had double vision since he came to the ED. He denies neck pain. On review of system patient endorses pleuritic chest pain, shortness of breath, abdominal pain with coughing, productive cough, fever. He denies alcohol use. Since the fall patient has been walking with an ataxic gait, the wife says that he was doing this before the fall she thinks this is secondary to the excessive Ambien use. This was unwitnessed, patient was sleeping in the spare bedroom because he was keeping his wife up with constant coughing.  Past Medical History  Diagnosis Date  . History of pulmonary embolism   . GERD (gastroesophageal reflux disease)   . S/P dilatation of esophageal stricture   . Lung nodule     BENIGN RIGHT UPPER--  CHEST CT 08-22-2011  . Anticoagulated on warfarin   . Arthritis     KNEES, NECK, SHOULDERS, HANDS  . Gout, arthritis     BILATERAL GREAT TOE-- PER PT STABLE  . Anxiety   . Asthma   . OSA (obstructive sleep apnea)     NO CPAP -states dr said possibly related to asthma  . Cancer     skin cancer-  2 basal cell, 1 squamous cell  . H/O transfusion of packed red blood cells     as a child/ s/p  tonsillectomy  . Stroke    Past Surgical History  Procedure Laterality Date  . Total knee arthroplasty Left 10-15-2008  . Wrist ganglion excision Right 1964  . Knee arthroscopy Right   . Carpal tunnel release Right 2010  . Tonsillectomy  AGE 45  . Shoulder arthroscopy Right   . Knee arthroscopy Right   . Joint replacement    . Total knee arthroplasty Right 01/18/2014    Procedure: RIGHT TOTAL KNEE ARTHROPLASTY;  Surgeon: Gearlean Alf, MD;  Location: [facility];  Service: Orthopedics;  Laterality: Right;   Family History  Problem Relation Age of Onset  . Deep vein thrombosis Father   . Stroke Brother   . Stroke Father   . Colon cancer Mother    History  Substance Use Topics  . Smoking status: Former Smoker -- 1.00 packs/day for 25 years    Types: Cigarettes    Quit date: 07/13/1980  . Smokeless tobacco: Never Used  . Alcohol Use: Yes     Comment: RARE    Review of Systems  10 systems reviewed and found to be negative, except as noted in the HPI.   Allergies  Garlic; Onion; Codeine; and Novocain  Home Medications   Prior to Admission medications   Medication Sig Start Date End Date Taking? Authorizing Provider  acetaminophen (TYLENOL) 500 MG tablet Take 1,000 mg by mouth 2 (two)  times daily. And prn   Yes Historical Provider, MD  albuterol (PROVENTIL) (2.5 MG/3ML) 0.083% nebulizer solution Take 2.5 mg by nebulization every 6 (six) hours as needed for wheezing or shortness of breath.   Yes Historical Provider, MD  albuterol-ipratropium (COMBIVENT) 18-103 MCG/ACT inhaler Inhale 2 puffs into the lungs every 4 (four) hours as needed for wheezing or shortness of breath.   Yes Historical Provider, MD  allopurinol (ZYLOPRIM) 300 MG tablet Take 300 mg by mouth daily.   Yes Historical Provider, MD  azithromycin (ZITHROMAX) 250 MG tablet Take 1 tablet (250 mg total) by mouth daily. 11/04/14 11/07/14 Yes Carmin Muskrat, MD  beclomethasone (QVAR) 80 MCG/ACT inhaler Inhale 2 puffs into  the lungs 2 (two) times daily. 07/21/14  Yes Kathee Delton, MD  ClomiPHENE Citrate (CLOMID PO) Take 1 tablet by mouth daily. Unknown dosage to help increase testosterone.   Yes Historical Provider, MD  dextromethorphan (DELSYM) 30 MG/5ML liquid Take 30 mg by mouth 2 (two) times daily as needed for cough.   Yes Historical Provider, MD  guaiFENesin (MUCINEX) 600 MG 12 hr tablet Take 1,200 mg by mouth 2 (two) times daily as needed (for cough).   Yes Historical Provider, MD  LYRICA 100 MG capsule Take 100 mg by mouth 2 (two) times daily as needed (for nerve pain.).  10/14/14  Yes Historical Provider, MD  methocarbamol (ROBAXIN) 500 MG tablet Take 1 tablet (500 mg total) by mouth every 6 (six) hours as needed for muscle spasms. 01/20/14  Yes Arlee Muslim, PA-C  Multiple Vitamin (MULTIVITAMIN WITH MINERALS) TABS tablet Take 1 tablet by mouth daily.   Yes Historical Provider, MD  ranitidine (ZANTAC) 75 MG tablet Take 150 mg by mouth 2 (two) times daily.   Yes Historical Provider, MD  simvastatin (ZOCOR) 10 MG tablet Take 10 mg by mouth daily. 11/03/14  Yes Historical Provider, MD  tadalafil (CIALIS) 20 MG tablet Take 20 mg by mouth daily as needed for erectile dysfunction.   Yes Historical Provider, MD  warfarin (COUMADIN) 5 MG tablet Take 1 tablet (5 mg total) by mouth every evening. Take Coumadin for three weeks for postoperative protocol and then the patient may resume their previous Coumadin home regimen.  The dose may need to be adjusted based upon the INR.  Please follow the INR and titrate Coumadin dose for a therapeutic range between 2.0 and 3.0 INR.  After completing the three weeks of Coumadin, the patient may resume their previous Coumadin home regimen. Patient taking differently: Take 5-7.5 mg by mouth as directed. 7.5 mg on Tues. Thurs. Sat,.& Sun 5 mg on all other days. 01/20/14  Yes Arlee Muslim, PA-C  albuterol (PROVENTIL HFA;VENTOLIN HFA) 108 (90 BASE) MCG/ACT inhaler Inhale 2 puffs into the  lungs every 6 (six) hours as needed for wheezing or shortness of breath.    Historical Provider, MD  escitalopram (LEXAPRO) 20 MG tablet Take 20 mg by mouth every 7 (seven) days.     Historical Provider, MD  ramelteon (ROZEREM) 8 MG tablet Take 1 tablet (8 mg total) by mouth at bedtime as needed for sleep. 11/05/14   Liyat Faulkenberry, PA-C   BP 130/70 mmHg  Pulse 95  Temp(Src) 97.5 F (36.4 C) (Oral)  Resp 16  SpO2 95% Physical Exam  Constitutional: He is oriented to person, place, and time. He appears well-developed and well-nourished. No distress.  HENT:  Head: Normocephalic and atraumatic.  Mouth/Throat: Oropharynx is clear and moist.  No abrasions or contusions.  No hemotympanum, battle signs or raccoon's eyes  No crepitance or tenderness to palpation along the orbital rim.  EOMI intact with no pain or diplopia  No abnormal otorrhea or rhinorrhea. Nasal septum midline.  No intraoral trauma.      Eyes: Conjunctivae and EOM are normal. Pupils are equal, round, and reactive to light.  Neck: Normal range of motion. Neck supple.  No midline C-spine  tenderness to palpation or step-offs appreciated. Patient has full range of motion without pain.   Cardiovascular: Normal rate, regular rhythm and intact distal pulses.   Pulmonary/Chest: Effort normal and breath sounds normal. No stridor. No respiratory distress. He has no wheezes. He has no rales. He exhibits no tenderness.  Abdominal: Soft.  Musculoskeletal: Normal range of motion.  Neurological: He is alert and oriented to person, place, and time.  II-Visual fields grossly intact. III/IV/VI-Extraocular movements intact.  Pupils reactive bilaterally. V/VII-Smile symmetric, equal eyebrow raise,  facial sensation intact VIII- Hearing grossly intact IX/X-Normal gag XI-bilateral shoulder shrug XII-midline tongue extension Motor: 5/5 bilaterally with normal tone and bulk Cerebellar: Normal finger-to-nose  and normal heel-to-shin  test.     Skin:  Multiple irregular moles on his back (states they are evaluated by dermal every 6 month)  Psychiatric: He has a normal mood and affect.  Nursing note and vitals reviewed.   ED Course  Procedures (including critical care time) Labs Review Labs Reviewed  PROTIME-INR - Abnormal; Notable for the following:    Prothrombin Time 43.7 (*)    INR 4.58 (*)    All other components within normal limits    Imaging Review Dg Chest 2 View  11/03/2014   CLINICAL DATA:  Shortness of breath  EXAM: CHEST  2 VIEW  COMPARISON:  06/18/2014  FINDINGS: Normal heart size and mediastinal contours. No acute infiltrate or edema. No effusion or pneumothorax. No acute osseous findings.  IMPRESSION: No active cardiopulmonary disease.   Electronically Signed   By: Jorje Guild M.D.   On: 11/03/2014 23:50   Ct Head Wo Contrast  11/05/2014   CLINICAL DATA:  Head trauma on blood thinners.  Initial encounter.  EXAM: CT HEAD WITHOUT CONTRAST  TECHNIQUE: Contiguous axial images were obtained from the base of the skull through the vertex without intravenous contrast.  COMPARISON:  None.  FINDINGS: Skull and Sinuses:No acute fracture.  Mild inflammatory mucosal thickening in the paranasal sinuses, especially the left maxillary sinus. No sinus effusion.  Orbits: No acute abnormality.  Brain: No evidence of acute infarction, hemorrhage, hydrocephalus, or mass lesion/mass effect. Mild generalized cerebral volume loss.  IMPRESSION: No acute intracranial injury or fracture.   Electronically Signed   By: Jorje Guild M.D.   On: 11/05/2014 06:46     EKG Interpretation None      MDM   Final diagnoses:  Head trauma  Supratherapeutic INR  Acute bronchitis, unspecified organism    Filed Vitals:   11/05/14 0447  BP: 130/70  Pulse: 95  Temp: 97.5 F (36.4 C)  TempSrc: Oral  Resp: 16  SpO2: 95%     [name] is a pleasant 74 y.o. male presenting with mild head trauma, anticoagulated with  warfarin. Neuro exam is nonfocal. Head CT is without abnormality. Patient is ambulating with a steady gait, independently. INR is found to be slightly supratherapeutic at 4.6. No indication for reversal at this time. Advised patient to hold warfarin for the next 24 hours, will change his Ambien on 2 [place]. Patient will be discharged  home with his wife who will keep a close eye on him today. Extensive discussion of return precautions for head trauma.   Discussed case with attending MD who agrees with plan and stability to d/c to home.   Evaluation does not show pathology that would require ongoing emergent intervention or inpatient treatment. Pt is hemodynamically stable and mentating appropriately. Discussed findings and plan with patient/guardian, who agrees with care plan. All questions answered. Return precautions discussed and outpatient follow up given.   New Prescriptions   RAMELTEON (ROZEREM) 8 MG TABLET    Take 1 tablet (8 mg total) by mouth at bedtime as needed for sleep.         Monico Blitz, PA-C 11/05/14 0913  Monico Blitz, PA-C 11/05/14 0539  Dot Lanes, MD 11/05/14 [phone]

## 2014-11-05 NOTE — Discharge Instructions (Signed)
Do not take any warfarin for the next 24 hours. Please follow with your primary care physician for recheck of your INR on Monday.  Do not take any Ambien.  Please follow with your primary care doctor in the next 2 days for a check-up. They must obtain records for further management.   Do not hesitate to return to the Emergency Department for any new, worsening or concerning symptoms.

## 2014-11-08 ENCOUNTER — Other Ambulatory Visit: Payer: Self-pay | Admitting: Pulmonary Disease

## 2014-11-22 ENCOUNTER — Telehealth: Payer: Self-pay | Admitting: *Deleted

## 2014-11-22 ENCOUNTER — Telehealth: Payer: Self-pay | Admitting: Neurology

## 2014-11-22 NOTE — Telephone Encounter (Signed)
Pt called wanting to speak to a nurse regarding the TOPIMATE 25mg . Pt wanted to let Dr. Tomi Likens know that it is working for him.  C/b [phone]

## 2014-11-22 NOTE — Telephone Encounter (Signed)
patient called stating that the TOPIMATE 25mg .is working good for him right now he still has  3 refills

## 2014-12-22 ENCOUNTER — Encounter (INDEPENDENT_AMBULATORY_CARE_PROVIDER_SITE_OTHER): Payer: Medicare Other | Admitting: Ophthalmology

## 2014-12-24 ENCOUNTER — Ambulatory Visit: Payer: Medicare Other | Admitting: Neurology

## 2015-02-11 ENCOUNTER — Other Ambulatory Visit: Payer: Self-pay | Admitting: Pulmonary Disease

## 2015-02-15 ENCOUNTER — Ambulatory Visit: Payer: Self-pay | Admitting: Neurology

## 2015-04-16 ENCOUNTER — Other Ambulatory Visit: Payer: Self-pay | Admitting: Pulmonary Disease

## 2015-05-02 ENCOUNTER — Other Ambulatory Visit: Payer: Self-pay

## 2015-11-22 ENCOUNTER — Other Ambulatory Visit: Payer: Self-pay | Admitting: Family Medicine

## 2015-11-22 ENCOUNTER — Ambulatory Visit
Admission: RE | Admit: 2015-11-22 | Discharge: 2015-11-22 | Disposition: A | Discharge status: Home / Self Care | Payer: Medicare Other | Source: Ambulatory Visit | Attending: Family Medicine | Admitting: Family Medicine

## 2015-11-22 DIAGNOSIS — S0990XA Unspecified injury of head, initial encounter: Secondary | ICD-10-CM

## 2015-12-08 DIAGNOSIS — Z7901 Long term (current) use of anticoagulants: Secondary | ICD-10-CM | POA: Diagnosis not present

## 2015-12-08 DIAGNOSIS — Z86711 Personal history of pulmonary embolism: Secondary | ICD-10-CM | POA: Diagnosis not present

## 2015-12-12 ENCOUNTER — Ambulatory Visit (INDEPENDENT_AMBULATORY_CARE_PROVIDER_SITE_OTHER): Payer: Medicare Other | Admitting: Neurology

## 2015-12-12 ENCOUNTER — Telehealth: Payer: Self-pay | Admitting: Neurology

## 2015-12-12 ENCOUNTER — Encounter: Payer: Self-pay | Admitting: Neurology

## 2015-12-12 VITALS — BP 131/84 | HR 77 | Resp 20 | Ht 71.0 in | Wt 233.0 lb

## 2015-12-12 DIAGNOSIS — S14109A Unspecified injury at unspecified level of cervical spinal cord, initial encounter: Secondary | ICD-10-CM

## 2015-12-12 DIAGNOSIS — F411 Generalized anxiety disorder: Secondary | ICD-10-CM

## 2015-12-12 DIAGNOSIS — R258 Other abnormal involuntary movements: Secondary | ICD-10-CM | POA: Diagnosis not present

## 2015-12-12 DIAGNOSIS — R208 Other disturbances of skin sensation: Secondary | ICD-10-CM

## 2015-12-12 DIAGNOSIS — M4712 Other spondylosis with myelopathy, cervical region: Secondary | ICD-10-CM

## 2015-12-12 DIAGNOSIS — M5010 Cervical disc disorder with radiculopathy, unspecified cervical region: Secondary | ICD-10-CM | POA: Diagnosis not present

## 2015-12-12 DIAGNOSIS — R252 Cramp and spasm: Secondary | ICD-10-CM

## 2015-12-12 DIAGNOSIS — R29898 Other symptoms and signs involving the musculoskeletal system: Secondary | ICD-10-CM | POA: Diagnosis not present

## 2015-12-12 DIAGNOSIS — R200 Anesthesia of skin: Secondary | ICD-10-CM

## 2015-12-12 MED ORDER — ALPRAZOLAM 0.5 MG PO TABS: ORAL_TABLET | ORAL | Status: DC

## 2015-12-12 MED ORDER — GABAPENTIN 300 MG PO CAPS: 300.0000 mg | ORAL_CAPSULE | Freq: Three times a day (TID) | ORAL | Status: DC

## 2015-12-12 NOTE — Telephone Encounter (Signed)
Called [place] M back from [place]. No other meds in formulary that do not requite PA. Lorazepam and diazepam also require PA.

## 2015-12-12 NOTE — Patient Instructions (Signed)
Remember to drink plenty of fluid, eat healthy meals and do not skip any meals. Try to eat protein with a every meal and eat a healthy snack such as fruit or nuts in between meals. Try to keep a regular sleep-wake schedule and try to exercise daily, particularly in the form of walking, 20-30 minutes a day, if you can.   As far as your medications are concerned, I would like to suggest: Neurontin 300mg  three times a day  As far as diagnostic testing: MRi of the cervical cord, EMG/ncs  I would like to see you back for emg/ncs, sooner if we need to. Please call us with any interim questions, concerns, problems, updates or refill requests.   Our phone number is [phone]. We also have an after hours call service for urgent matters and there is a physician on-call for urgent questions. For any emergencies you know to call 911 or go to the nearest emergency room

## 2015-12-12 NOTE — Telephone Encounter (Signed)
[name]/[name] [phone] has patient at pharmacy now who advised, was seen by Dr. Jaynee Eagles this morning, Dr. Jaynee Eagles was to prescribe Gabapentin and another prescription. [name] states, they only received Rx for Gabapentin. Patient is going to be scheduled for MRI and this would be an anti-anxiety medication.

## 2015-12-12 NOTE — Telephone Encounter (Signed)
Called pt and related xanax not covered by his insurance. Lorazepam is, but needs a PA. This may take a couple days to process. He has taking lorazepam before and had no SE. He tolerated this well. Advised I will let Dr Jaynee Eagles know and we will fax this to his pharmacy if ok with her. He verbalized understanding.

## 2015-12-12 NOTE — Progress Notes (Signed)
[facility]    Provider:  Dr Jaynee Eagles Referring Provider: Dr. Melina Schools Primary Care Physician:  Vidal Schwalbe, MD  CC:  Neck pain.  HPI:  [name] is a 75 y.o. male here as a referral from Dr. Dema Severin and Dr. Melina Schools for neck pain. 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 Pain is very bad, the right arm is severely painful. Started 5-7 years ago and is progressive. He gets pain that runs all the way from the shoulder to the middle 2 fingers. He has not had emg/ncs. He is in chronic pain. Also reports neck stiffness, impaired range of motion, numbness and tingling of the right arm and upper extremity weakness. Radiating pain is sharp. Symptoms are worsening. It waxes and wanes. He is 3-4 pain at the best and 10/10 at the worsr. At the most he has problems moving the arm due to pain, can be 10/10 pain. Nothing helps. Sleeping on it and use makes it worse. No other associated symptoms. He is having no symptoms in the other limbs. He also endorses weakness in the arm. The pain is continuous, The weakness is progressive. He has tingling and numbness in digit 3-4 in the right hand.   Reviewed notes, labs and imaging from outside physicians, which showed:   Review of records from Dr. Rolena Infante, cervical spine surgery was discussed with ACDF and C3-C7 fusion which is an extensive operation.   MRI of the cervical spine images were very low in quality. Impression of MRI of the cervical spine in January showed canal stenosis at C3-C4 with cord atrophy, severe bilateral foraminal stenosis. At C4-C5 showed borderline canal stenosis, grade 1 spondylolisthesis of C4 and C5, posterior endplate spurs asymmetrically affecting the right C5 root, severe right foraminal  stenosis. At C5-C6 mild canal stenosis with cord contact, moderate disc bulging, grade 1 spondylolisthesis of C5 on C6 and moderate to severe left foraminal stenosis. At C6-C7 and mild canal stenosis and cord deformity with asymmetric mass effect on right C7 root. C7-T1 small right posterior lateral protrusion. C2-C3 mild canal stenosis at cord contact. Facet ankylosis is appreciated at multiple levels. Personally reviewed images and agree with findings.  CT of the head 11/25/2015 (personally reviewed images and agree with the following):  FINDINGS: There is chronic diffuse atrophy. There is no midline shift, hydrocephalus or mass. No acute hemorrhage or acute transcortical infarct is identified. The bony calvarium is intact. The visualized sinuses are clear. There is frontal scalp swelling/ hematoma.  IMPRESSION: No focal acute intracranial abnormality identified.  Chronic diffuse atrophy.  Patient was seen by [facility] in August 2015. Per Dr. 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 He had an MRI of the brain without contrast performed on 06/30/14, which revealed three subcortical acute infarcts in the right parietal lobe. MRA of the head was normal. He underwent a stroke workup. Carotid duplex showed bilateral atherosclerotic plaque but no hemodynamically significant stenosis in the internal carotids. Sed rate was 11, INR was 3.1, CRP was 2.5. A recent 2D Echocardiogram was performed on 06/14/14, which revealed a normal LVEF of 55-60% with no wall motion abnormalities or cardiac source of emboli. He is compliant with his Coumadin.  Of note, recent CT scan of the chest performed in June 2015 was negative for recurrent PE. He has been evaluated by a hematologist in the past, with hypercoagulable panel not too revealing for a specific thrombophilia. The headache is now dull and he  takes Tylenol for it.  He has history of DVT and PE on Coumadin with supratherapeutic INR. He has a history of skull fracture as a child, in which he suffered from chronic headaches up until his early 73s. He had a neck injury at age 30, in which he had a myelopathy and has residual hyperreflexia and numbness in the right leg. He has arthritis with bilateral knee surgeries.   Review of Systems: Patient complains of symptoms per HPI as well as the following symptoms: Fatigue, easy bruising, shortness of breath, cough, wheezing, hearing loss, joint pain, aching muscles, headache, weakness, sleepiness, depression, anxiety, decreased energy. Pertinent negatives per HPI. All others negative.   Social History   Social History  . Marital Status: Married    Spouse Name: N/A  . Number of Children: N/A  . Years of Education: N/A   Occupational History  . CNA    Social History Main Topics  . Smoking status: Former Smoker -- 1.00 packs/day for 25 years    Types: Cigarettes    Quit date: 07/13/1980  . Smokeless tobacco: Never Used  . Alcohol Use: Yes     Comment: RARE  . Drug Use: No  . Sexual Activity:    Partners: Female   Other Topics Concern  . Not on file   Social History Narrative    Family History  Problem Relation Age of Onset  . Deep vein thrombosis Father   . Stroke Father   . Stroke Brother   . Colon cancer Mother   . Migraines Neg Hx     Past Medical History  Diagnosis Date  . History of pulmonary embolism   . GERD (gastroesophageal reflux disease)   . S/P dilatation of esophageal stricture   . Lung nodule     BENIGN RIGHT UPPER--  CHEST CT 08-22-2011  . Anticoagulated on warfarin   . Arthritis     KNEES, NECK, SHOULDERS, HANDS  . Gout, arthritis     BILATERAL GREAT TOE-- PER PT STABLE  . Anxiety   . Asthma   . OSA (obstructive sleep apnea)     NO CPAP -states dr said possibly related to asthma  . Cancer ([place])     skin cancer-  2 basal cell, 1 squamous  cell  . H/O transfusion of packed red blood cells     as a child/ s/p tonsillectomy  . Stroke [facility])     Past Surgical History  Procedure Laterality Date  . Total knee arthroplasty Left 10-15-2008  . Wrist ganglion excision Right 1964  . Knee arthroscopy Right   . Carpal tunnel release Right 2010  . Tonsillectomy  AGE 62  . Shoulder arthroscopy Right   . Knee arthroscopy Right   . Joint replacement    . Total knee arthroplasty Right 01/18/2014    Procedure: RIGHT TOTAL KNEE ARTHROPLASTY;  Surgeon: Gearlean Alf, MD;  Location: [facility];  Service: Orthopedics;  Laterality: Right;    Current Outpatient Prescriptions  Medication Sig Dispense Refill  . acetaminophen (TYLENOL) 500 MG tablet Take 1,000 mg by mouth 2 (two) times daily. And prn    . albuterol (PROVENTIL HFA;VENTOLIN HFA) 108 (90 BASE) MCG/ACT inhaler Inhale 2 puffs into the lungs every  6 (six) hours as needed for wheezing or shortness of breath.    [name] albuterol (PROVENTIL) (2.5 MG/3ML) 0.083% nebulizer solution Take 2.5 mg by nebulization every 6 (six) hours as needed for wheezing or shortness of breath.    . allopurinol (ZYLOPRIM) 300 MG tablet Take 300 mg by mouth daily.    . ClomiPHENE Citrate (CLOMID PO) Take 1 tablet by mouth daily. Unknown dosage to help increase testosterone.    . escitalopram (LEXAPRO) 20 MG tablet Take 20 mg by mouth every 7 (seven) days.     [name] gabapentin (NEURONTIN) 300 MG capsule Take 1 capsule (300 mg total) by mouth 3 (three) times daily. 90 capsule 11  . guaiFENesin (MUCINEX) 600 MG 12 hr tablet Take 1,200 mg by mouth 2 (two) times daily as needed (for cough).    . methocarbamol (ROBAXIN) 500 MG tablet Take 1 tablet (500 mg total) by mouth every 6 (six) hours as needed for muscle spasms. 80 tablet 0  . Multiple Vitamin (MULTIVITAMIN WITH MINERALS) TABS tablet Take 1 tablet by mouth daily.    [name] QVAR 80 MCG/ACT inhaler INHALE TWO PUFFS BY MOUTH TWICE DAILY 8.7 g 1  . simvastatin (ZOCOR) 10 MG tablet  Take 10 mg by mouth daily.  0  . tadalafil (CIALIS) 20 MG tablet Take 20 mg by mouth daily as needed for erectile dysfunction.    [name] warfarin (COUMADIN) 5 MG tablet Take 1 tablet (5 mg total) by mouth every evening. Take Coumadin for three weeks for postoperative protocol and then the patient may resume their previous Coumadin home regimen.  The dose may need to be adjusted based upon the INR.  Please follow the INR and titrate Coumadin dose for a therapeutic range between 2.0 and 3.0 INR.  After completing the three weeks of Coumadin, the patient may resume their previous Coumadin home regimen. (Patient taking differently: Take 5-7.5 mg by mouth as directed. 7.5 mg on Tues. Thurs. Sat,.& Sun 5 mg on all other days.) 45 tablet 0  . LORazepam (ATIVAN) 0.5 MG tablet Take 1-2 tablets 30 minutes prior to test. Must have driver to and from test. Medication can cause sedation. 2 tablet 0  . [DISCONTINUED] topiramate (TOPAMAX) 25 MG tablet Take 1 tablet (25 mg total) by mouth 2 (two) times daily. 120 tablet 3  . [DISCONTINUED] zolpidem (AMBIEN) 10 MG tablet Take 10 mg by mouth at bedtime as needed for sleep.      No current facility-administered medications for this visit.    Allergies as of 12/12/2015 - Review Complete 12/12/2015  Allergen Reaction Noted  . Garlic Anaphylaxis [id number]  . Onion Anaphylaxis 01/11/2014  . Codeine Other (See Comments) 03/03/2012  . Hydrocodone  12/12/2015  . Novocain [procaine] Nausea Only 07/13/2013    Vitals: BP 131/84 mmHg  Pulse 77  Resp 20  Ht 5\' 11"  (1.803 m)  Wt 233 lb (105.688 kg)  BMI 32.51 kg/m2 Last Weight:  Wt Readings from Last 1 Encounters:  12/12/15 233 lb (105.688 kg)   Last Height:   Ht Readings from Last 1 Encounters:  12/12/15 5\' 11"  (1.803 m)   Physical exam: Exam: Gen: NAD, conversant, well nourised, obese, well groomed                     CV: RRR, no MRG. No Carotid Bruits. No peripheral edema, warm, nontender Eyes: Conjunctivae  clear without exudates or hemorrhage MSK: Decreased range of motion in the cervical spine  Neuro: Detailed  Neurologic Exam  Speech:    Speech is normal; fluent and spontaneous with normal comprehension.  Cognition:    The patient is oriented to person, place, and time;     recent and remote memory intact;     language fluent;     normal attention, concentration,     fund of knowledge Cranial Nerves:    The pupils are equal, round, and reactive to light. The fundi are nml. Visual fields are full to finger confrontation. Extraocular movements are intact. Trigeminal sensation is intact and the muscles of mastication are normal. The face is symmetric. The palate elevates in the midline. Hearing intact. Voice is normal. Shoulder shrug is normal. The tongue has normal motion without fasciculations.   Coordination:    Normal finger to nose and heel to shin.   Gait:    Imbalance, Difficulty with tandem, normal stance, good arm swing and turn, not wide-based or narrow-base. He cannot heel or toe walk.  Motor Observation:    No asymmetry, no atrophy, and no involuntary movements noted. Tone:    Normal muscle tone.    Posture:    Posture is normal. normal erect    Strength:    Strength is V/V in the upper and lower limbs.      Sensation: dec sensation right 4th digit     Reflex Exam:  DTR's: Decr right deltoid. Otherwise deep tendon reflexes in the upper and lower extremities are brisk bilaterally.  Positive Hoffmann sign in the bilateral upper extremities. Toes:    The toes are upgoing bilaterally.   Clonus:    Clonus  AJs.       Assessment/Plan:    Rayden Kohring Weng is a 75 y.o. male here as a referral from Dr. Dema Severin and Dr. Melina Schools for neck pain. He is a 75 year old male with a PMHx DVT and PE on chronic anticoagulation (coumadin), CKD, lacunar strokes in 2015 on MRI brain, headaches, hyperlipidemia, depression, neck trauma 1970 at c3-c4. He has chronic neck pain with  radicular symptoms into the right hand. He has been evaluated by Dr. Rolena Infante and extensive cervical surgery was discussed including ACDF and C3-C7 fusion. MRI of the cervical spine shows multilevel significant cervical degenerative disc disease  and spinal cord atrophy at C3-C4 possibly with contribution from a previous injury.  Cervical radiculopathy: - Patient has upper motor neuron signs on exam likely due to cervical myelopathy. Patient would like MRI of the cervical spine repeated since images were not of the best quality likely due to motion, can do that and provide images to Dr. Rolena Infante for review before surgery -  Can perform emg/ncs, patient would like complete work up before proceeding with surgery. - After surgery recommend PT for gait and balance, fall risk. - neurontin 300mg  tid  Stroke: - Stroke in 2015, lacunar infarcts likely small vessel disease however due to a retinal occlusion it was thought that is may be cardioembolic.  He follows with hematology and is on chronic coumadin for PE and DVT. His vision has improved considerably.  - I had a long d/w patient about previous stroke, risk for recurrent stroke/TIAs, personally independently reviewed imaging studies and stroke evaluation results and answered questions. On chronic coumadin. Maintain strict control of hypertension with blood pressure goal below 130/90, diabetes with hemoglobin A1c goal below 6.5% and lipids with LDL cholesterol goal below 70 mg/dL. Continue Statin.  I also advised the patient to eat a healthy diet with plenty of whole  grains, cereals, fruits and vegetables, exercise regularly and maintain ideal body weight.   Headaches: - Was on Topamax. Start neurontin 300mg  tid for radicular pain may also help headaches. - Daily tylenol can cause medication overuse and rebound - Cont Lexapro   CC: Cynthia white and Dr. Zenon Mayo, MD  [facility] [address] [place], [place] [address]  Phone [phone] Fax [phone]

## 2015-12-12 NOTE — Telephone Encounter (Signed)
Per Dr Jaynee Eagles- faxed rx xanax to pt pharmacy.

## 2015-12-12 NOTE — Telephone Encounter (Signed)
Marlaine Hind with [employer] is calling and states that the Rx Alprazolam 0.5 mg tablets is not covered under his formulary so the patient would like to know if he can get a Rx Lorazepam instead.  Ask for Marlaine Hind. @[phone].  Thanks!

## 2015-12-12 NOTE — Telephone Encounter (Signed)
It is fine, please order and fax thank you

## 2015-12-12 NOTE — Telephone Encounter (Signed)
Received fax confirmation

## 2015-12-13 MED ORDER — LORAZEPAM 0.5 MG PO TABS: ORAL_TABLET | ORAL | Status: DC

## 2015-12-13 NOTE — Telephone Encounter (Signed)
Returned call from [place]. Spoke to [place]. She took pt member ID number, DOB. Gave MD name and NPI. They additional questions about rx lorazepam. She transferred me to department to speak to [facility]. She took information over the phone to do PA for lorazepam. She will fax approval to our office if accepted and notify pharmacy.

## 2015-12-13 NOTE — Telephone Encounter (Signed)
Please call Olin Hauser with [facility] @[phone] opt [phone] to speak with her about the Rx Lorazepam.

## 2015-12-13 NOTE — Telephone Encounter (Signed)
Faxed rx to pt pharmacy this am. Will start PA request.

## 2015-12-14 DIAGNOSIS — D2271 Melanocytic nevi of right lower limb, including hip: Secondary | ICD-10-CM | POA: Diagnosis not present

## 2015-12-14 DIAGNOSIS — Z85828 Personal history of other malignant neoplasm of skin: Secondary | ICD-10-CM | POA: Diagnosis not present

## 2015-12-14 DIAGNOSIS — Z23 Encounter for immunization: Secondary | ICD-10-CM | POA: Diagnosis not present

## 2015-12-14 DIAGNOSIS — L821 Other seborrheic keratosis: Secondary | ICD-10-CM | POA: Diagnosis not present

## 2015-12-14 DIAGNOSIS — D225 Melanocytic nevi of trunk: Secondary | ICD-10-CM | POA: Diagnosis not present

## 2015-12-14 NOTE — Telephone Encounter (Signed)
Mark with [facility] is calling to advise that medication LORazepam (ATIVAN) 0.5 MG tablet has been approved for the patient. A returned call is not necessary.

## 2015-12-16 ENCOUNTER — Telehealth: Payer: Self-pay | Admitting: *Deleted

## 2015-12-16 NOTE — Telephone Encounter (Addendum)
Called and spoke to Gene from [employer]. Verified that lorazepam 0.5mg  tablet was approved.  Approved effective 12/13/15-12/12/16. Approval letter sent on 12/14/15.

## 2015-12-16 NOTE — Telephone Encounter (Signed)
Error

## 2015-12-18 ENCOUNTER — Encounter: Payer: Self-pay | Admitting: Neurology

## 2015-12-18 DIAGNOSIS — M5010 Cervical disc disorder with radiculopathy, unspecified cervical region: Secondary | ICD-10-CM | POA: Insufficient documentation

## 2015-12-18 DIAGNOSIS — S14109A Unspecified injury at unspecified level of cervical spinal cord, initial encounter: Secondary | ICD-10-CM | POA: Insufficient documentation

## 2015-12-18 DIAGNOSIS — M4712 Other spondylosis with myelopathy, cervical region: Secondary | ICD-10-CM | POA: Insufficient documentation

## 2015-12-20 ENCOUNTER — Ambulatory Visit
Admission: RE | Admit: 2015-12-20 | Discharge: 2015-12-20 | Disposition: A | Discharge status: Home / Self Care | Payer: Medicare Other | Source: Ambulatory Visit | Attending: Neurology | Admitting: Neurology

## 2015-12-20 DIAGNOSIS — R200 Anesthesia of skin: Secondary | ICD-10-CM

## 2015-12-20 DIAGNOSIS — R29898 Other symptoms and signs involving the musculoskeletal system: Secondary | ICD-10-CM

## 2015-12-20 DIAGNOSIS — M5010 Cervical disc disorder with radiculopathy, unspecified cervical region: Secondary | ICD-10-CM

## 2015-12-20 DIAGNOSIS — M4802 Spinal stenosis, cervical region: Secondary | ICD-10-CM | POA: Diagnosis not present

## 2015-12-20 DIAGNOSIS — R252 Cramp and spasm: Secondary | ICD-10-CM

## 2015-12-20 DIAGNOSIS — M4712 Other spondylosis with myelopathy, cervical region: Secondary | ICD-10-CM

## 2015-12-22 DIAGNOSIS — Z86711 Personal history of pulmonary embolism: Secondary | ICD-10-CM | POA: Diagnosis not present

## 2015-12-22 DIAGNOSIS — Z7901 Long term (current) use of anticoagulants: Secondary | ICD-10-CM | POA: Diagnosis not present

## 2015-12-23 ENCOUNTER — Telehealth: Payer: Self-pay | Admitting: *Deleted

## 2015-12-23 NOTE — Telephone Encounter (Signed)
-----   Message from Melvenia Beam, MD sent at 12/22/2015  5:26 PM EST ----- Emam, MRI of the cervical spine results consistent with the last MRi of his cervical spine. No new findings. He is coming for emg/ncs on the 20th and I can review with him at that time thanks

## 2015-12-23 NOTE — Telephone Encounter (Signed)
Called pt and explained results per Dr Jaynee Eagles note. Pt verbalized understanding.

## 2015-12-26 ENCOUNTER — Ambulatory Visit (INDEPENDENT_AMBULATORY_CARE_PROVIDER_SITE_OTHER): Payer: Medicare Other | Admitting: Neurology

## 2015-12-26 ENCOUNTER — Ambulatory Visit (INDEPENDENT_AMBULATORY_CARE_PROVIDER_SITE_OTHER): Payer: Self-pay | Admitting: Neurology

## 2015-12-26 DIAGNOSIS — Z0289 Encounter for other administrative examinations: Secondary | ICD-10-CM

## 2015-12-26 DIAGNOSIS — R202 Paresthesia of skin: Secondary | ICD-10-CM | POA: Diagnosis not present

## 2015-12-26 DIAGNOSIS — M5010 Cervical disc disorder with radiculopathy, unspecified cervical region: Secondary | ICD-10-CM

## 2015-12-26 NOTE — Progress Notes (Signed)
See procedure note.

## 2015-12-28 ENCOUNTER — Telehealth: Payer: Self-pay | Admitting: Neurology

## 2015-12-28 DIAGNOSIS — M47812 Spondylosis without myelopathy or radiculopathy, cervical region: Secondary | ICD-10-CM

## 2015-12-28 DIAGNOSIS — M4722 Other spondylosis with radiculopathy, cervical region: Secondary | ICD-10-CM | POA: Diagnosis not present

## 2015-12-28 DIAGNOSIS — Z7901 Long term (current) use of anticoagulants: Secondary | ICD-10-CM | POA: Diagnosis not present

## 2015-12-28 DIAGNOSIS — Z6834 Body mass index (BMI) 34.0-34.9, adult: Secondary | ICD-10-CM | POA: Diagnosis not present

## 2015-12-28 DIAGNOSIS — Z86711 Personal history of pulmonary embolism: Secondary | ICD-10-CM | POA: Diagnosis not present

## 2015-12-28 DIAGNOSIS — M5010 Cervical disc disorder with radiculopathy, unspecified cervical region: Secondary | ICD-10-CM

## 2015-12-28 NOTE — Telephone Encounter (Signed)
Pt would like a referral to see Dr. Rigoberto Noel, Neurosurgeon at [facility]. May call pt at [phone]

## 2015-12-28 NOTE — Telephone Encounter (Signed)
Patient call inadvertently tasked to me.

## 2015-12-29 NOTE — Telephone Encounter (Signed)
[name], let him know we are happy to do that. I'm out of the office but I'll be in the office Monday and will get the referral there. Thanks.

## 2015-12-30 NOTE — Telephone Encounter (Signed)
Called pt back. Advised per Dr Jaynee Eagles that she is ok to send referral to Dr Rigoberto Noel. I will place referral and we will send referral to them on Monday.

## 2016-01-01 ENCOUNTER — Telehealth: Payer: Self-pay | Admitting: Neurology

## 2016-01-01 ENCOUNTER — Other Ambulatory Visit: Payer: Self-pay | Admitting: Neurology

## 2016-01-01 DIAGNOSIS — M5010 Cervical disc disorder with radiculopathy, unspecified cervical region: Secondary | ICD-10-CM

## 2016-01-01 NOTE — Telephone Encounter (Signed)
[place] referral to [facility] for [place] Dr. Rigoberto Noel. Please forward my emg/ncs results and my last office note. Let patient know he needs to being an MRI with the images. thanks

## 2016-01-01 NOTE — Procedures (Signed)
[facility]    Provider: Dr Jaynee Eagles Referring Provider: Dr. Melina Schools Primary Care Physician: Joseph Schwalbe, MD  CC: Neck pain.  HPI: [name] is a 75 y.o. male here as a referral from Dr. Dema Severin and Dr. Melina Schools for neck pain. 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 Pain is very bad, the right arm is severely painful in the deltoids and biceps. Started 5-7 years ago and is progressive. He gets pain that runs all the way from the shoulder to the middle 2 fingers. He has not had emg/ncs. He is in chronic pain. Also reports neck stiffness, impaired range of motion, numbness and tingling of the right arm and upper extremity weakness. Radiating pain is sharp. Symptoms are worsening. It waxes and wanes. He is 3-4 pain at the best and 10/10 at the worsr. At the most he has problems moving the arm due to pain, can be 10/10 pain. Nothing helps. Sleeping on it and use makes it worse. No other associated symptoms. He is having no symptoms in the other limbs. He also endorses weakness in the arm. The pain is continuous, The weakness is progressive. He has tingling and numbness in digit 3-4 in the right hand.   MRI of the cervical spine:  The study is of adequate technical quality. The cervical vertebrae demonstrate loss of forward lordotic curvature with posterior subluxation and spondylolisthesis of C4-C6 vertebrae. There are marked disc degenerative changes noted throughout with prominent endplate osteophytes posteriorly at C2-3, C3-4 and C6-7. There are endplate marrow degenerative changes posteriorly at C3-4 and C6-7 mainly. C2-3 shows mild disc osteophyte protrusion paracentrally to the left as well as prominent ligamentum flavum hypertrophy resulting in  mild canal and bilateral foraminal narrowing. C3-4 shows loss of disc height with mild endplate marrow degenerative changes as well as large central disc osteophyte protrusion resulting in moderate canal and severe bilateral foraminal narrowing with mild cord atrophy. C4-5 shows mild spondylolisthesis and posterior endplate spurring asymmetrically more on the right resulting in severe foraminal narrowing. C5-6 shows mild disc protrusion and moderate foraminal narrowing on the left. C6-7 shows mild canal and symmetric right greater than left foraminal narrowing with possible impingement on the right C7 nerve root. C7 that T1 shows mild focal central disc osteophyte protrusion without significant narrowing. The visualized portion of the lower brainstem, cranial vertebral junction and paraspinal soft tissues appear unremarkable    IMPRESSION: Abnormal MRI scan of the cervical spine showing severe degenerative changes throughout most noticeable at C3-4 where there is severe bilateral foraminal stenosis and canal stenosis with mild cord atrophy. There is also prominent asymmetric foraminal narrowing at C4-5 and C6-7 resulting in right C5 and possibly right C7 nerve root impingement.  Summary  Nerve conduction studies were performed on the bilateral upper extremities:  The bilateral Median motor nerves showed normal conductions with normal F Wave latency The bilateral Ulnar motor nerves showed normal conductions with normal F Wave latency The bilateral second-digit Median sensory nerves were within normal limits The bilateral fifth-digit Ulnar sensory nerves were within normal limits  EMG Needle study was performed on selected right upper extremity muscles:   The right Deltoid showed increased spontaneous activity (+3 positive sharp waves and fibrillation potentials), the right Triceps showed decreased motor unit recruitment, the right biceps  showed decreased motor unit recruitment and increased motor  unit amplitude. The right Supraspinatus, right Infraspinatus,  right Pronator Teres, Flexor Digitorum Profundus(Ulnar), right Opponens Pollicis, right First Dorsal interosseous were within normal limits. Could not sample paraspinals due to anticoagulation.  Conclusion: This is a abnormal study. The is evidence of acute denervation and chronic neurogenic changes in muscles that share C5/C6 innervation. Consistent with cervical radiculopathy. Could not sample paraspinals on EMG due to anticoagulation.

## 2016-01-01 NOTE — Progress Notes (Signed)
[facility]    Provider: Dr Jaynee Eagles Referring Provider: Dr. Melina Schools Primary Care Physician: Vidal Schwalbe, MD  CC: Neck pain.  HPI: [name] is a 75 y.o. male here as a referral from Dr. Dema Severin and Dr. Melina Schools for neck pain. 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 Pain is very bad, the right arm is severely painful in the deltoids and biceps. Started 5-7 years ago and is progressive. He gets pain that runs all the way from the shoulder to the middle 2 fingers. He has not had emg/ncs. He is in chronic pain. Also reports neck stiffness, impaired range of motion, numbness and tingling of the right arm and upper extremity weakness. Radiating pain is sharp. Symptoms are worsening. It waxes and wanes. He is 3-4 pain at the best and 10/10 at the worsr. At the most he has problems moving the arm due to pain, can be 10/10 pain. Nothing helps. Sleeping on it and use makes it worse. No other associated symptoms. He is having no symptoms in the other limbs. He also endorses weakness in the arm. The pain is continuous, The weakness is progressive. He has tingling and numbness in digit 3-4 in the right hand.   MRI of the cervical spine:  The study is of adequate technical quality. The cervical vertebrae demonstrate loss of forward lordotic curvature with posterior subluxation and spondylolisthesis of C4-C6 vertebrae. There are marked disc degenerative changes noted throughout with prominent endplate osteophytes posteriorly at C2-3, C3-4 and C6-7. There are endplate marrow degenerative changes posteriorly at C3-4 and C6-7 mainly. C2-3 shows mild disc osteophyte protrusion paracentrally to the left as well as prominent ligamentum flavum hypertrophy resulting in  mild canal and bilateral foraminal narrowing. C3-4 shows loss of disc height with mild endplate marrow degenerative changes as well as large central disc osteophyte protrusion resulting in moderate canal and severe bilateral foraminal narrowing with mild cord atrophy. C4-5 shows mild spondylolisthesis and posterior endplate spurring asymmetrically more on the right resulting in severe foraminal narrowing. C5-6 shows mild disc protrusion and moderate foraminal narrowing on the left. C6-7 shows mild canal and symmetric right greater than left foraminal narrowing with possible impingement on the right C7 nerve root. C7 that T1 shows mild focal central disc osteophyte protrusion without significant narrowing. The visualized portion of the lower brainstem, cranial vertebral junction and paraspinal soft tissues appear unremarkable    IMPRESSION: Abnormal MRI scan of the cervical spine showing severe degenerative changes throughout most noticeable at C3-4 where there is severe bilateral foraminal stenosis and canal stenosis with mild cord atrophy. There is also prominent asymmetric foraminal narrowing at C4-5 and C6-7 resulting in right C5 and possibly right C7 nerve root impingement.  Summary  Nerve conduction studies were performed on the bilateral upper extremities:  The bilateral Median motor nerves showed normal conductions with normal F Wave latency The bilateral Ulnar motor nerves showed normal conductions with normal F Wave latency The bilateral second-digit Median sensory nerves were within normal limits The bilateral fifth-digit Ulnar sensory nerves were within normal limits  EMG Needle study was performed on selected right upper extremity muscles:   The right Deltoid showed increased spontaneous activity (+3 positive sharp waves and fibrillation potentials), the right Triceps showed decreased motor unit recruitment, the right biceps  showed decreased motor unit recruitment and increased motor  unit amplitude. The right Supraspinatus, right Infraspinatus,  right Pronator Teres, Flexor Digitorum Profundus(Ulnar), right Opponens Pollicis, right First Dorsal interosseous were within normal limits. Could not sample paraspinals due to anticoagulation.  Conclusion: This is a abnormal study. The is evidence of acute denervation and chronic neurogenic changes in muscles that share C5/C6 innervation. Consistent with cervical radiculopathy. Could not sample paraspinals on EMG due to anticoagulation.

## 2016-01-02 NOTE — Telephone Encounter (Signed)
I have spoke to patient he has MRI CD to take with him to apt. I have faxed all records MRI , Notes , NCV/EMG and insurance. To [facility] spoke with [facility] telephone [phone]. Fax [phone]. Patient is aware of all details and will be looking out from phone from [facility].

## 2016-01-10 DIAGNOSIS — M5412 Radiculopathy, cervical region: Secondary | ICD-10-CM | POA: Diagnosis not present

## 2016-01-10 DIAGNOSIS — M542 Cervicalgia: Secondary | ICD-10-CM | POA: Diagnosis not present

## 2016-01-11 DIAGNOSIS — Z86711 Personal history of pulmonary embolism: Secondary | ICD-10-CM | POA: Diagnosis not present

## 2016-01-11 DIAGNOSIS — Z7901 Long term (current) use of anticoagulants: Secondary | ICD-10-CM | POA: Diagnosis not present

## 2016-01-13 DIAGNOSIS — R635 Abnormal weight gain: Secondary | ICD-10-CM | POA: Diagnosis not present

## 2016-01-13 DIAGNOSIS — N289 Disorder of kidney and ureter, unspecified: Secondary | ICD-10-CM | POA: Diagnosis not present

## 2016-01-17 DIAGNOSIS — M542 Cervicalgia: Secondary | ICD-10-CM | POA: Diagnosis not present

## 2016-01-17 DIAGNOSIS — M5412 Radiculopathy, cervical region: Secondary | ICD-10-CM | POA: Diagnosis not present

## 2016-01-17 IMAGING — CT CT HEAD W/O CM
2 series · 17 of 30 positions shown, 20 images · non-contrast
Comparison: None.

CLINICAL DATA: Head trauma on blood thinners.  Initial encounter.

EXAM:
CT HEAD WITHOUT CONTRAST
TECHNIQUE: Contiguous axial images were obtained from the base of the skull
through the vertex without intravenous contrast.

[Series 2: head w/o · axial · non-contrast · 0.45mm/px · z∈[-148,-18]mm · 9 of 34 slices shown, 12 images]
[im 4/34  brain]
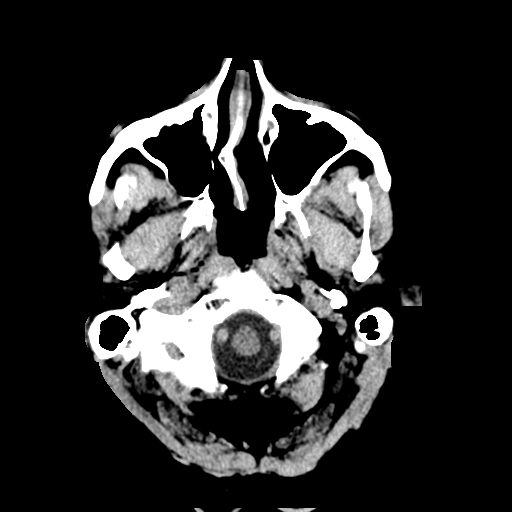
[im 4/34  bone]
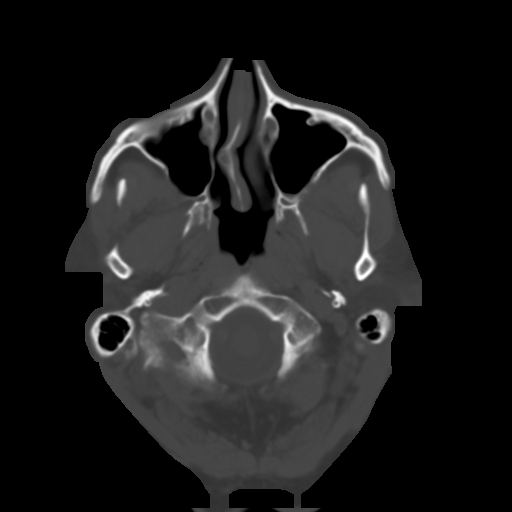
[im 7/34  brain]
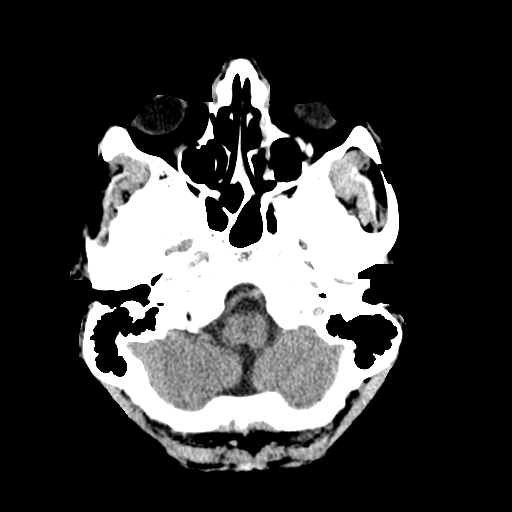
[im 10/34  brain]
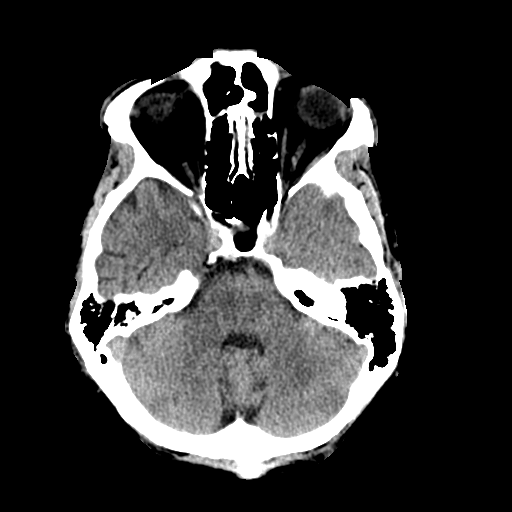
[im 14/34  brain]
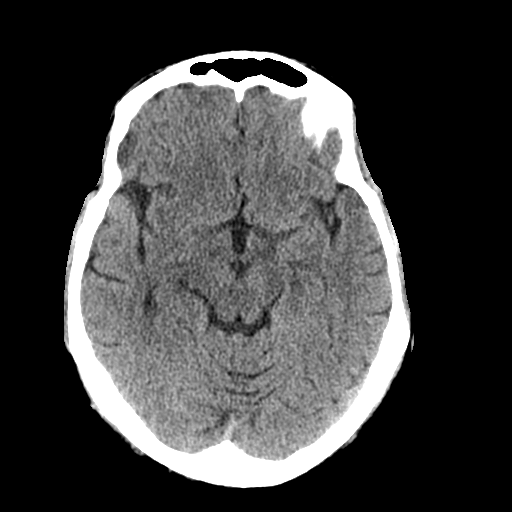
[im 17/34  brain]
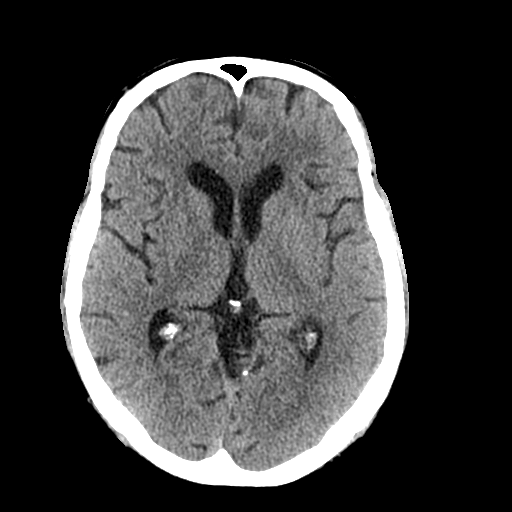
[im 17/34  bone]
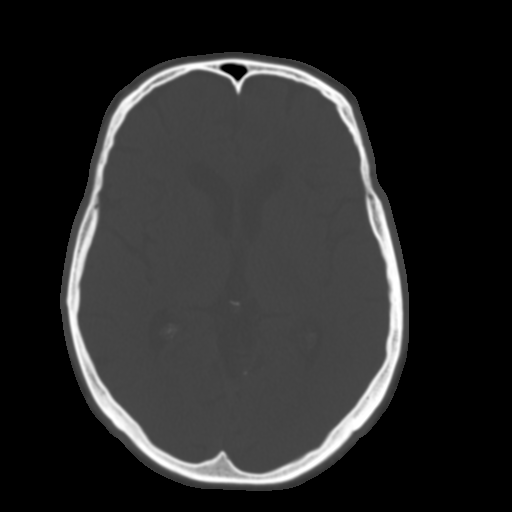
[im 20/34  brain]
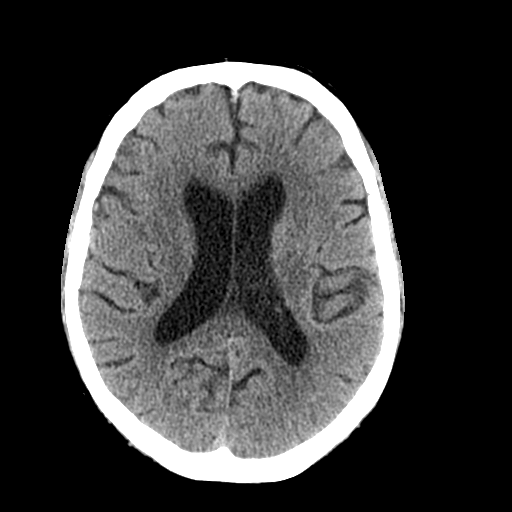
[im 24/34  brain]
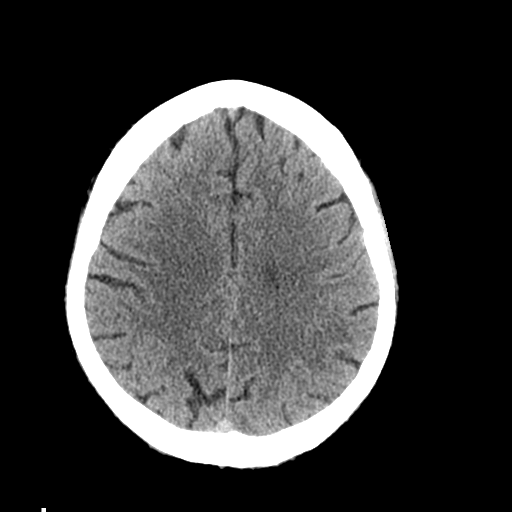
[im 27/34  brain]
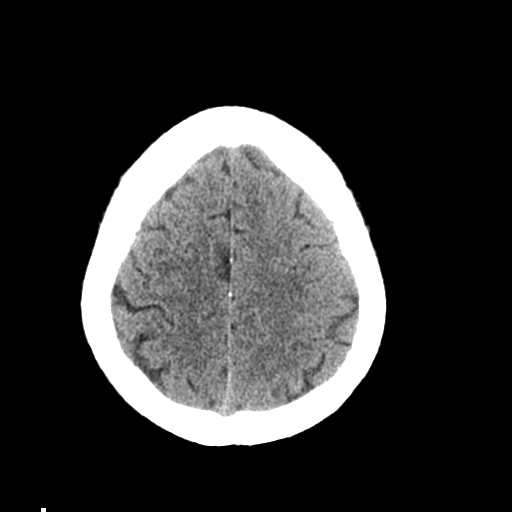
[im 30/34  brain]
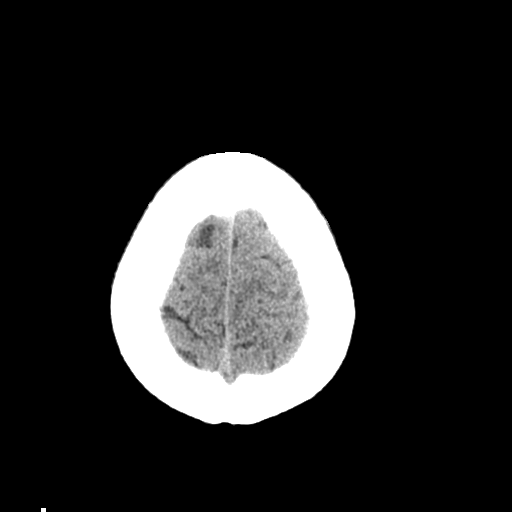
[im 30/34  bone]
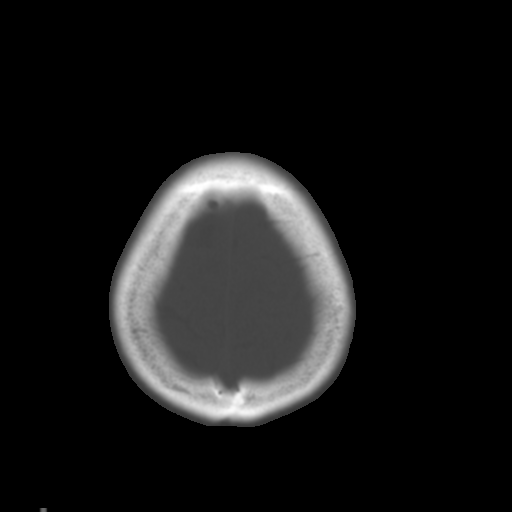

[Series 3: bone windows · axial · 0.45mm/px · z∈[-145,-16]mm · 8 of 57 slices shown]
[im 7/57  bone]
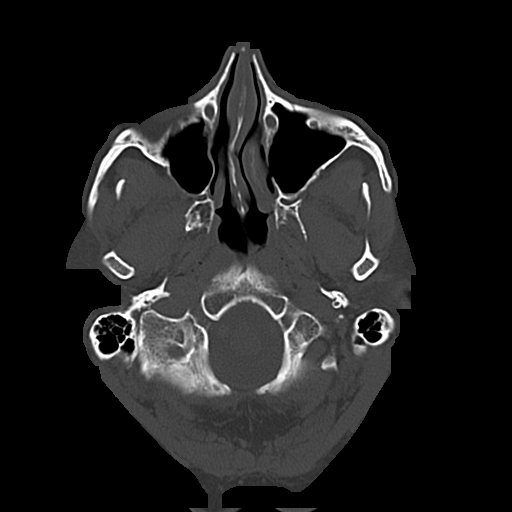
[im 13/57  bone]
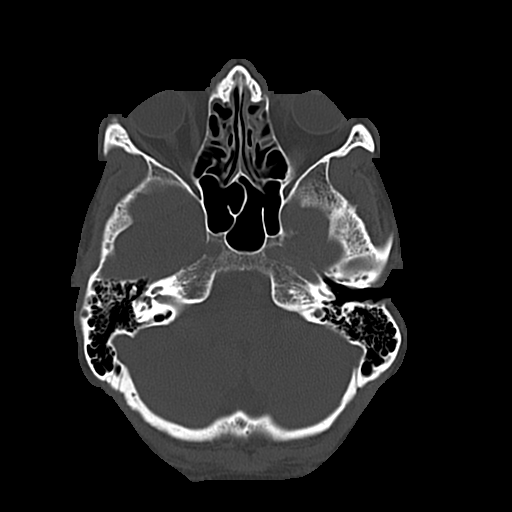
[im 19/57  bone]
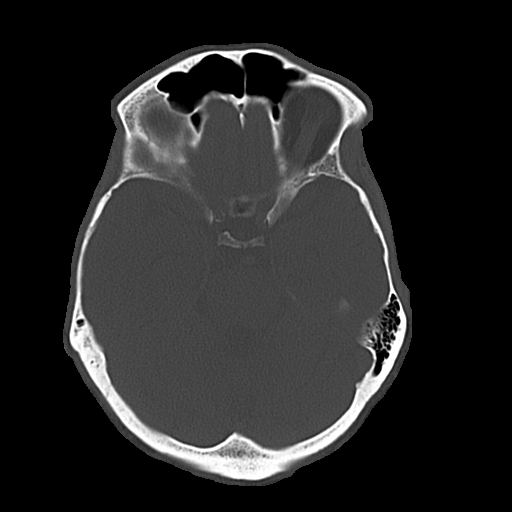
[im 25/57  bone]
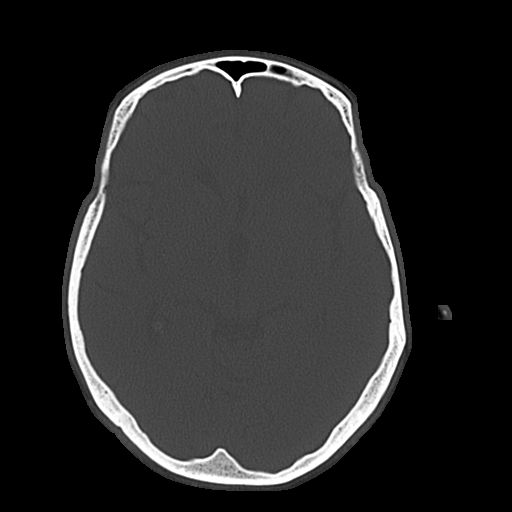
[im 32/57  bone]
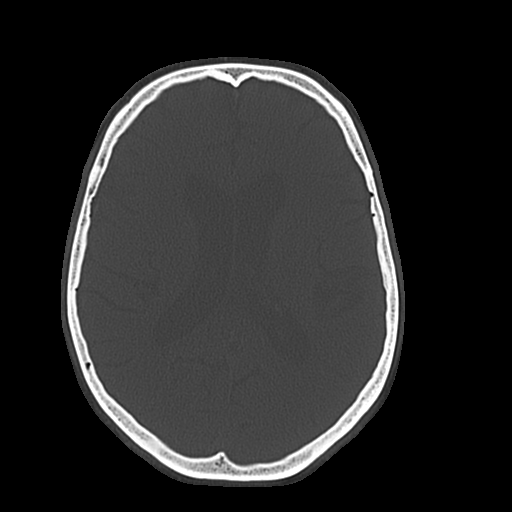
[im 38/57  bone]
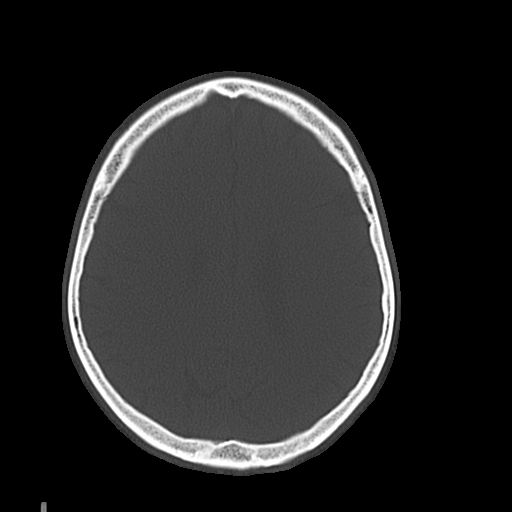
[im 44/57  bone]
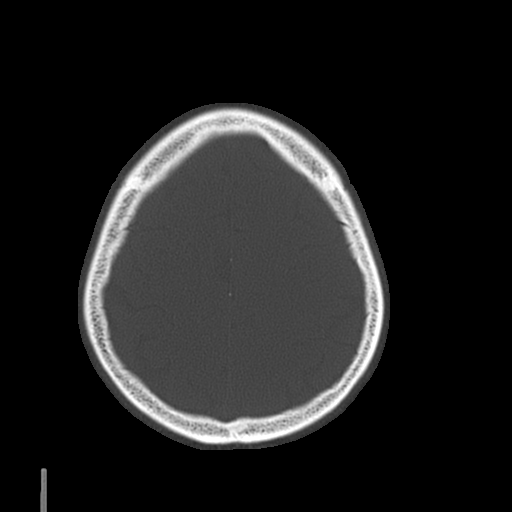
[im 50/57  bone]
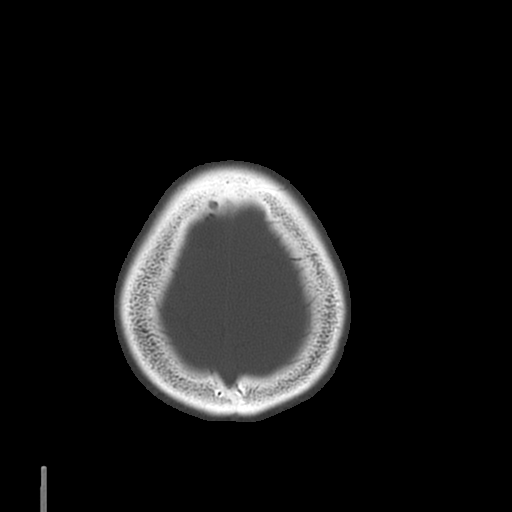

[17 of 30 positions shown; findings below may reference images not displayed]

FINDINGS: Skull and Sinuses:No acute fracture.

Mild inflammatory mucosal thickening in the paranasal sinuses,
especially the left maxillary sinus. No sinus effusion.

Orbits: No acute abnormality.

Brain: No evidence of acute infarction, hemorrhage, hydrocephalus,
or mass lesion/mass effect. Mild generalized cerebral volume loss.
IMPRESSION: No acute intracranial injury or fracture.

## 2016-01-24 DIAGNOSIS — M5412 Radiculopathy, cervical region: Secondary | ICD-10-CM | POA: Diagnosis not present

## 2016-01-24 DIAGNOSIS — M542 Cervicalgia: Secondary | ICD-10-CM | POA: Diagnosis not present

## 2016-01-30 DIAGNOSIS — M5412 Radiculopathy, cervical region: Secondary | ICD-10-CM | POA: Diagnosis not present

## 2016-01-30 DIAGNOSIS — R51 Headache: Secondary | ICD-10-CM | POA: Diagnosis not present

## 2016-01-30 DIAGNOSIS — Z7901 Long term (current) use of anticoagulants: Secondary | ICD-10-CM | POA: Diagnosis not present

## 2016-01-30 DIAGNOSIS — Z87820 Personal history of traumatic brain injury: Secondary | ICD-10-CM | POA: Diagnosis not present

## 2016-01-30 DIAGNOSIS — E785 Hyperlipidemia, unspecified: Secondary | ICD-10-CM | POA: Diagnosis not present

## 2016-01-30 DIAGNOSIS — Z86711 Personal history of pulmonary embolism: Secondary | ICD-10-CM | POA: Diagnosis not present

## 2016-02-02 DIAGNOSIS — M542 Cervicalgia: Secondary | ICD-10-CM | POA: Diagnosis not present

## 2016-02-02 DIAGNOSIS — M5412 Radiculopathy, cervical region: Secondary | ICD-10-CM | POA: Diagnosis not present

## 2016-02-07 DIAGNOSIS — R3915 Urgency of urination: Secondary | ICD-10-CM | POA: Diagnosis not present

## 2016-02-07 DIAGNOSIS — Z0000 Encounter for general adult medical examination without abnormal findings: Secondary | ICD-10-CM | POA: Diagnosis not present

## 2016-02-07 DIAGNOSIS — E291 Testicular hypofunction: Secondary | ICD-10-CM | POA: Diagnosis not present

## 2016-02-07 DIAGNOSIS — N5201 Erectile dysfunction due to arterial insufficiency: Secondary | ICD-10-CM | POA: Diagnosis not present

## 2016-02-07 DIAGNOSIS — N261 Atrophy of kidney (terminal): Secondary | ICD-10-CM | POA: Diagnosis not present

## 2016-02-08 DIAGNOSIS — Z86711 Personal history of pulmonary embolism: Secondary | ICD-10-CM | POA: Diagnosis not present

## 2016-02-08 DIAGNOSIS — Z7901 Long term (current) use of anticoagulants: Secondary | ICD-10-CM | POA: Diagnosis not present

## 2016-02-10 DIAGNOSIS — M5412 Radiculopathy, cervical region: Secondary | ICD-10-CM | POA: Diagnosis not present

## 2016-02-10 DIAGNOSIS — M542 Cervicalgia: Secondary | ICD-10-CM | POA: Diagnosis not present

## 2016-02-15 DIAGNOSIS — Z86711 Personal history of pulmonary embolism: Secondary | ICD-10-CM | POA: Diagnosis not present

## 2016-02-15 DIAGNOSIS — Z7901 Long term (current) use of anticoagulants: Secondary | ICD-10-CM | POA: Diagnosis not present

## 2016-02-22 DIAGNOSIS — Z7901 Long term (current) use of anticoagulants: Secondary | ICD-10-CM | POA: Diagnosis not present

## 2016-02-22 DIAGNOSIS — Z86711 Personal history of pulmonary embolism: Secondary | ICD-10-CM | POA: Diagnosis not present

## 2016-02-27 DIAGNOSIS — Z86711 Personal history of pulmonary embolism: Secondary | ICD-10-CM | POA: Diagnosis not present

## 2016-02-27 DIAGNOSIS — Z7901 Long term (current) use of anticoagulants: Secondary | ICD-10-CM | POA: Diagnosis not present

## 2016-03-06 DIAGNOSIS — Z86711 Personal history of pulmonary embolism: Secondary | ICD-10-CM | POA: Diagnosis not present

## 2016-03-06 DIAGNOSIS — Z7901 Long term (current) use of anticoagulants: Secondary | ICD-10-CM | POA: Diagnosis not present

## 2016-03-13 DIAGNOSIS — Z86711 Personal history of pulmonary embolism: Secondary | ICD-10-CM | POA: Diagnosis not present

## 2016-03-13 DIAGNOSIS — Z7901 Long term (current) use of anticoagulants: Secondary | ICD-10-CM | POA: Diagnosis not present

## 2016-03-19 DIAGNOSIS — Z7901 Long term (current) use of anticoagulants: Secondary | ICD-10-CM | POA: Diagnosis not present

## 2016-03-19 DIAGNOSIS — Z86711 Personal history of pulmonary embolism: Secondary | ICD-10-CM | POA: Diagnosis not present

## 2016-04-03 DIAGNOSIS — Z86711 Personal history of pulmonary embolism: Secondary | ICD-10-CM | POA: Diagnosis not present

## 2016-04-03 DIAGNOSIS — Z7901 Long term (current) use of anticoagulants: Secondary | ICD-10-CM | POA: Diagnosis not present

## 2016-04-18 DIAGNOSIS — Z7901 Long term (current) use of anticoagulants: Secondary | ICD-10-CM | POA: Diagnosis not present

## 2016-04-18 DIAGNOSIS — Z86711 Personal history of pulmonary embolism: Secondary | ICD-10-CM | POA: Diagnosis not present

## 2016-05-21 DIAGNOSIS — S61551A Open bite of right wrist, initial encounter: Secondary | ICD-10-CM | POA: Diagnosis not present

## 2016-05-21 DIAGNOSIS — S51851A Open bite of right forearm, initial encounter: Secondary | ICD-10-CM | POA: Diagnosis not present

## 2016-05-21 DIAGNOSIS — W540XXA Bitten by dog, initial encounter: Secondary | ICD-10-CM | POA: Diagnosis not present

## 2016-05-30 DIAGNOSIS — Z86711 Personal history of pulmonary embolism: Secondary | ICD-10-CM | POA: Diagnosis not present

## 2016-05-30 DIAGNOSIS — Z7901 Long term (current) use of anticoagulants: Secondary | ICD-10-CM | POA: Diagnosis not present

## 2016-05-30 DIAGNOSIS — Z6834 Body mass index (BMI) 34.0-34.9, adult: Secondary | ICD-10-CM | POA: Diagnosis not present

## 2016-05-30 DIAGNOSIS — E785 Hyperlipidemia, unspecified: Secondary | ICD-10-CM | POA: Diagnosis not present

## 2016-05-30 DIAGNOSIS — M4722 Other spondylosis with radiculopathy, cervical region: Secondary | ICD-10-CM | POA: Diagnosis not present

## 2016-06-12 DIAGNOSIS — R609 Edema, unspecified: Secondary | ICD-10-CM | POA: Diagnosis not present

## 2016-06-12 DIAGNOSIS — M5030 Other cervical disc degeneration, unspecified cervical region: Secondary | ICD-10-CM | POA: Diagnosis not present

## 2016-06-12 DIAGNOSIS — N63 Unspecified lump in breast: Secondary | ICD-10-CM | POA: Diagnosis not present

## 2016-06-13 ENCOUNTER — Other Ambulatory Visit: Payer: Self-pay | Admitting: Family Medicine

## 2016-06-13 DIAGNOSIS — N63 Unspecified lump in unspecified breast: Secondary | ICD-10-CM

## 2016-06-20 ENCOUNTER — Ambulatory Visit
Admission: RE | Admit: 2016-06-20 | Discharge: 2016-06-20 | Disposition: A | Discharge status: Home / Self Care | Payer: Medicare Other | Source: Ambulatory Visit | Attending: Family Medicine | Admitting: Family Medicine

## 2016-06-20 DIAGNOSIS — N63 Unspecified lump in unspecified breast: Secondary | ICD-10-CM

## 2016-06-20 DIAGNOSIS — N62 Hypertrophy of breast: Secondary | ICD-10-CM | POA: Diagnosis not present

## 2016-06-27 DIAGNOSIS — Z7901 Long term (current) use of anticoagulants: Secondary | ICD-10-CM | POA: Diagnosis not present

## 2016-06-27 DIAGNOSIS — Z86711 Personal history of pulmonary embolism: Secondary | ICD-10-CM | POA: Diagnosis not present

## 2016-07-18 DIAGNOSIS — M4722 Other spondylosis with radiculopathy, cervical region: Secondary | ICD-10-CM | POA: Diagnosis not present

## 2016-07-26 DIAGNOSIS — Z7901 Long term (current) use of anticoagulants: Secondary | ICD-10-CM | POA: Diagnosis not present

## 2016-07-26 DIAGNOSIS — Z86711 Personal history of pulmonary embolism: Secondary | ICD-10-CM | POA: Diagnosis not present

## 2016-08-20 DIAGNOSIS — R5383 Other fatigue: Secondary | ICD-10-CM | POA: Diagnosis not present

## 2016-08-20 DIAGNOSIS — Z7901 Long term (current) use of anticoagulants: Secondary | ICD-10-CM | POA: Diagnosis not present

## 2016-08-20 DIAGNOSIS — G4700 Insomnia, unspecified: Secondary | ICD-10-CM | POA: Diagnosis not present

## 2016-08-20 DIAGNOSIS — Z23 Encounter for immunization: Secondary | ICD-10-CM | POA: Diagnosis not present

## 2016-08-20 DIAGNOSIS — Z86711 Personal history of pulmonary embolism: Secondary | ICD-10-CM | POA: Diagnosis not present

## 2016-08-20 DIAGNOSIS — R634 Abnormal weight loss: Secondary | ICD-10-CM | POA: Diagnosis not present

## 2016-08-21 DIAGNOSIS — Z6832 Body mass index (BMI) 32.0-32.9, adult: Secondary | ICD-10-CM | POA: Diagnosis not present

## 2016-08-21 DIAGNOSIS — M4722 Other spondylosis with radiculopathy, cervical region: Secondary | ICD-10-CM | POA: Diagnosis not present

## 2016-08-21 DIAGNOSIS — I10 Essential (primary) hypertension: Secondary | ICD-10-CM | POA: Diagnosis not present

## 2016-08-24 DIAGNOSIS — Z7901 Long term (current) use of anticoagulants: Secondary | ICD-10-CM | POA: Diagnosis not present

## 2016-08-24 DIAGNOSIS — Z86711 Personal history of pulmonary embolism: Secondary | ICD-10-CM | POA: Diagnosis not present

## 2016-08-28 DIAGNOSIS — Z7901 Long term (current) use of anticoagulants: Secondary | ICD-10-CM | POA: Diagnosis not present

## 2016-08-28 DIAGNOSIS — Z86711 Personal history of pulmonary embolism: Secondary | ICD-10-CM | POA: Diagnosis not present

## 2016-09-05 DIAGNOSIS — Z7901 Long term (current) use of anticoagulants: Secondary | ICD-10-CM | POA: Diagnosis not present

## 2016-09-05 DIAGNOSIS — Z86711 Personal history of pulmonary embolism: Secondary | ICD-10-CM | POA: Diagnosis not present

## 2016-09-25 DIAGNOSIS — M4722 Other spondylosis with radiculopathy, cervical region: Secondary | ICD-10-CM | POA: Diagnosis not present

## 2016-10-03 DIAGNOSIS — Z7901 Long term (current) use of anticoagulants: Secondary | ICD-10-CM | POA: Diagnosis not present

## 2016-10-03 DIAGNOSIS — Z86711 Personal history of pulmonary embolism: Secondary | ICD-10-CM | POA: Diagnosis not present

## 2016-10-18 DIAGNOSIS — Z86711 Personal history of pulmonary embolism: Secondary | ICD-10-CM | POA: Diagnosis not present

## 2016-10-18 DIAGNOSIS — Z7901 Long term (current) use of anticoagulants: Secondary | ICD-10-CM | POA: Diagnosis not present

## 2016-10-26 DIAGNOSIS — M75101 Unspecified rotator cuff tear or rupture of right shoulder, not specified as traumatic: Secondary | ICD-10-CM | POA: Diagnosis not present

## 2016-10-26 DIAGNOSIS — M65341 Trigger finger, right ring finger: Secondary | ICD-10-CM | POA: Diagnosis not present

## 2016-10-31 DIAGNOSIS — Z7901 Long term (current) use of anticoagulants: Secondary | ICD-10-CM | POA: Diagnosis not present

## 2016-10-31 DIAGNOSIS — Z86711 Personal history of pulmonary embolism: Secondary | ICD-10-CM | POA: Diagnosis not present

## 2016-11-06 DIAGNOSIS — M75101 Unspecified rotator cuff tear or rupture of right shoulder, not specified as traumatic: Secondary | ICD-10-CM | POA: Diagnosis not present

## 2016-11-08 DIAGNOSIS — Z7901 Long term (current) use of anticoagulants: Secondary | ICD-10-CM | POA: Diagnosis not present

## 2016-11-08 DIAGNOSIS — Z86711 Personal history of pulmonary embolism: Secondary | ICD-10-CM | POA: Diagnosis not present

## 2016-11-13 DIAGNOSIS — S46111A Strain of muscle, fascia and tendon of long head of biceps, right arm, initial encounter: Secondary | ICD-10-CM | POA: Diagnosis not present

## 2016-11-13 DIAGNOSIS — S46811A Strain of other muscles, fascia and tendons at shoulder and upper arm level, right arm, initial encounter: Secondary | ICD-10-CM | POA: Diagnosis not present

## 2016-11-13 DIAGNOSIS — M67911 Unspecified disorder of synovium and tendon, right shoulder: Secondary | ICD-10-CM | POA: Diagnosis not present

## 2016-11-26 DIAGNOSIS — Z7901 Long term (current) use of anticoagulants: Secondary | ICD-10-CM | POA: Diagnosis not present

## 2016-11-26 DIAGNOSIS — Z86711 Personal history of pulmonary embolism: Secondary | ICD-10-CM | POA: Diagnosis not present

## 2016-11-27 DIAGNOSIS — M25511 Pain in right shoulder: Secondary | ICD-10-CM | POA: Diagnosis not present

## 2016-12-04 DIAGNOSIS — M25511 Pain in right shoulder: Secondary | ICD-10-CM | POA: Diagnosis not present

## 2016-12-11 DIAGNOSIS — M25511 Pain in right shoulder: Secondary | ICD-10-CM | POA: Diagnosis not present

## 2016-12-18 DIAGNOSIS — M25511 Pain in right shoulder: Secondary | ICD-10-CM | POA: Diagnosis not present

## 2016-12-19 DIAGNOSIS — L821 Other seborrheic keratosis: Secondary | ICD-10-CM | POA: Diagnosis not present

## 2016-12-19 DIAGNOSIS — Z85828 Personal history of other malignant neoplasm of skin: Secondary | ICD-10-CM | POA: Diagnosis not present

## 2016-12-19 DIAGNOSIS — D2271 Melanocytic nevi of right lower limb, including hip: Secondary | ICD-10-CM | POA: Diagnosis not present

## 2016-12-19 DIAGNOSIS — Z23 Encounter for immunization: Secondary | ICD-10-CM | POA: Diagnosis not present

## 2016-12-24 DIAGNOSIS — Z7901 Long term (current) use of anticoagulants: Secondary | ICD-10-CM | POA: Diagnosis not present

## 2016-12-24 DIAGNOSIS — Z86711 Personal history of pulmonary embolism: Secondary | ICD-10-CM | POA: Diagnosis not present

## 2016-12-24 DIAGNOSIS — M25511 Pain in right shoulder: Secondary | ICD-10-CM | POA: Diagnosis not present

## 2016-12-25 DIAGNOSIS — S46811D Strain of other muscles, fascia and tendons at shoulder and upper arm level, right arm, subsequent encounter: Secondary | ICD-10-CM | POA: Diagnosis not present

## 2016-12-25 DIAGNOSIS — M67911 Unspecified disorder of synovium and tendon, right shoulder: Secondary | ICD-10-CM | POA: Diagnosis not present

## 2016-12-25 DIAGNOSIS — S46111D Strain of muscle, fascia and tendon of long head of biceps, right arm, subsequent encounter: Secondary | ICD-10-CM | POA: Diagnosis not present

## 2016-12-26 DIAGNOSIS — Z6832 Body mass index (BMI) 32.0-32.9, adult: Secondary | ICD-10-CM | POA: Diagnosis not present

## 2016-12-26 DIAGNOSIS — M4722 Other spondylosis with radiculopathy, cervical region: Secondary | ICD-10-CM | POA: Diagnosis not present

## 2016-12-26 DIAGNOSIS — R030 Elevated blood-pressure reading, without diagnosis of hypertension: Secondary | ICD-10-CM | POA: Diagnosis not present

## 2016-12-31 DIAGNOSIS — Z7901 Long term (current) use of anticoagulants: Secondary | ICD-10-CM | POA: Diagnosis not present

## 2016-12-31 DIAGNOSIS — Z86711 Personal history of pulmonary embolism: Secondary | ICD-10-CM | POA: Diagnosis not present

## 2017-01-08 DIAGNOSIS — Z7901 Long term (current) use of anticoagulants: Secondary | ICD-10-CM | POA: Diagnosis not present

## 2017-01-08 DIAGNOSIS — Z86711 Personal history of pulmonary embolism: Secondary | ICD-10-CM | POA: Diagnosis not present

## 2017-01-15 DIAGNOSIS — R030 Elevated blood-pressure reading, without diagnosis of hypertension: Secondary | ICD-10-CM | POA: Diagnosis not present

## 2017-01-15 DIAGNOSIS — Z6833 Body mass index (BMI) 33.0-33.9, adult: Secondary | ICD-10-CM | POA: Diagnosis not present

## 2017-01-15 DIAGNOSIS — M4722 Other spondylosis with radiculopathy, cervical region: Secondary | ICD-10-CM | POA: Diagnosis not present

## 2017-01-16 DIAGNOSIS — Z7901 Long term (current) use of anticoagulants: Secondary | ICD-10-CM | POA: Diagnosis not present

## 2017-01-16 DIAGNOSIS — Z86711 Personal history of pulmonary embolism: Secondary | ICD-10-CM | POA: Diagnosis not present

## 2017-01-17 ENCOUNTER — Telehealth: Payer: Self-pay

## 2017-01-17 NOTE — Telephone Encounter (Signed)
Received faxed office visit notes from [place]. Pt saw Simeon Craft, PA w/ plans to discuss w/ Dr. Luan Pulling if pt is appropriate for injection therapy. Quantity of tramadol was increased as pt is unable to tolerate other opioids. Educated not to use alcohol along w/ medication.

## 2017-01-23 DIAGNOSIS — Z86711 Personal history of pulmonary embolism: Secondary | ICD-10-CM | POA: Diagnosis not present

## 2017-01-23 DIAGNOSIS — Z7901 Long term (current) use of anticoagulants: Secondary | ICD-10-CM | POA: Diagnosis not present

## 2017-01-29 DIAGNOSIS — Z7901 Long term (current) use of anticoagulants: Secondary | ICD-10-CM | POA: Diagnosis not present

## 2017-01-29 DIAGNOSIS — Z86711 Personal history of pulmonary embolism: Secondary | ICD-10-CM | POA: Diagnosis not present

## 2017-02-07 DIAGNOSIS — M67811 Other specified disorders of synovium, right shoulder: Secondary | ICD-10-CM | POA: Diagnosis not present

## 2017-02-07 DIAGNOSIS — S46811D Strain of other muscles, fascia and tendons at shoulder and upper arm level, right arm, subsequent encounter: Secondary | ICD-10-CM | POA: Diagnosis not present

## 2017-02-07 DIAGNOSIS — H2513 Age-related nuclear cataract, bilateral: Secondary | ICD-10-CM | POA: Diagnosis not present

## 2017-02-07 DIAGNOSIS — M65341 Trigger finger, right ring finger: Secondary | ICD-10-CM | POA: Diagnosis not present

## 2017-02-07 DIAGNOSIS — S46111D Strain of muscle, fascia and tendon of long head of biceps, right arm, subsequent encounter: Secondary | ICD-10-CM | POA: Diagnosis not present

## 2017-02-14 DIAGNOSIS — Z86711 Personal history of pulmonary embolism: Secondary | ICD-10-CM | POA: Diagnosis not present

## 2017-02-14 DIAGNOSIS — Z7901 Long term (current) use of anticoagulants: Secondary | ICD-10-CM | POA: Diagnosis not present

## 2017-02-28 DIAGNOSIS — Z86711 Personal history of pulmonary embolism: Secondary | ICD-10-CM | POA: Diagnosis not present

## 2017-02-28 DIAGNOSIS — Z7901 Long term (current) use of anticoagulants: Secondary | ICD-10-CM | POA: Diagnosis not present

## 2017-03-11 DIAGNOSIS — S46111D Strain of muscle, fascia and tendon of long head of biceps, right arm, subsequent encounter: Secondary | ICD-10-CM | POA: Diagnosis not present

## 2017-03-11 DIAGNOSIS — S46811D Strain of other muscles, fascia and tendons at shoulder and upper arm level, right arm, subsequent encounter: Secondary | ICD-10-CM | POA: Diagnosis not present

## 2017-03-11 DIAGNOSIS — M65341 Trigger finger, right ring finger: Secondary | ICD-10-CM | POA: Diagnosis not present

## 2017-03-11 DIAGNOSIS — M67811 Other specified disorders of synovium, right shoulder: Secondary | ICD-10-CM | POA: Diagnosis not present

## 2017-03-14 DIAGNOSIS — Z86711 Personal history of pulmonary embolism: Secondary | ICD-10-CM | POA: Diagnosis not present

## 2017-03-14 DIAGNOSIS — Z7901 Long term (current) use of anticoagulants: Secondary | ICD-10-CM | POA: Diagnosis not present

## 2017-03-20 DIAGNOSIS — I10 Essential (primary) hypertension: Secondary | ICD-10-CM | POA: Diagnosis not present

## 2017-03-20 DIAGNOSIS — L820 Inflamed seborrheic keratosis: Secondary | ICD-10-CM | POA: Diagnosis not present

## 2017-03-20 DIAGNOSIS — L821 Other seborrheic keratosis: Secondary | ICD-10-CM | POA: Diagnosis not present

## 2017-03-20 DIAGNOSIS — M4722 Other spondylosis with radiculopathy, cervical region: Secondary | ICD-10-CM | POA: Diagnosis not present

## 2017-03-20 DIAGNOSIS — F321 Major depressive disorder, single episode, moderate: Secondary | ICD-10-CM | POA: Diagnosis not present

## 2017-03-20 DIAGNOSIS — M75121 Complete rotator cuff tear or rupture of right shoulder, not specified as traumatic: Secondary | ICD-10-CM | POA: Diagnosis not present

## 2017-03-20 DIAGNOSIS — D485 Neoplasm of uncertain behavior of skin: Secondary | ICD-10-CM | POA: Diagnosis not present

## 2017-03-20 DIAGNOSIS — G894 Chronic pain syndrome: Secondary | ICD-10-CM | POA: Diagnosis not present

## 2017-03-20 DIAGNOSIS — Z6832 Body mass index (BMI) 32.0-32.9, adult: Secondary | ICD-10-CM | POA: Diagnosis not present

## 2017-03-20 DIAGNOSIS — M5030 Other cervical disc degeneration, unspecified cervical region: Secondary | ICD-10-CM | POA: Diagnosis not present

## 2017-03-28 DIAGNOSIS — Z7901 Long term (current) use of anticoagulants: Secondary | ICD-10-CM | POA: Diagnosis not present

## 2017-03-28 DIAGNOSIS — Z86711 Personal history of pulmonary embolism: Secondary | ICD-10-CM | POA: Diagnosis not present

## 2017-04-05 DIAGNOSIS — Z7901 Long term (current) use of anticoagulants: Secondary | ICD-10-CM | POA: Diagnosis not present

## 2017-04-05 DIAGNOSIS — Z86711 Personal history of pulmonary embolism: Secondary | ICD-10-CM | POA: Diagnosis not present

## 2017-04-10 DIAGNOSIS — H903 Sensorineural hearing loss, bilateral: Secondary | ICD-10-CM | POA: Diagnosis not present

## 2017-04-12 ENCOUNTER — Telehealth: Payer: Self-pay

## 2017-04-12 NOTE — Telephone Encounter (Addendum)
Received 03/20/17 OV notes from Simeon Craft [facility] who said, "He is not a surgical candidate due to his comorbidities. He also states that he was told he could not have any type of injection therapy by 2 physicians due to 'narrowing in his spine'... The pt has trialed multiple medications... as well as conservative therapies... He has never found any of these to make any difference in his pain. I recommend he begin weaning off the Keppra and the tramadol as he reports no benefit... There is no point in the patient continuing... these medicines. He is ...  not a candidate for spinal cord stimulation. At this point I have little to offer the patient..." Sent to med records for scanning, copy to Dr. Jaynee Eagles for review.

## 2017-04-15 DIAGNOSIS — Z86711 Personal history of pulmonary embolism: Secondary | ICD-10-CM | POA: Diagnosis not present

## 2017-04-15 DIAGNOSIS — Z7901 Long term (current) use of anticoagulants: Secondary | ICD-10-CM | POA: Diagnosis not present

## 2017-04-26 DIAGNOSIS — Z7901 Long term (current) use of anticoagulants: Secondary | ICD-10-CM | POA: Diagnosis not present

## 2017-04-26 DIAGNOSIS — Z86711 Personal history of pulmonary embolism: Secondary | ICD-10-CM | POA: Diagnosis not present

## 2017-05-02 DIAGNOSIS — Z86010 Personal history of colonic polyps: Secondary | ICD-10-CM | POA: Diagnosis not present

## 2017-05-02 DIAGNOSIS — Z8673 Personal history of transient ischemic attack (TIA), and cerebral infarction without residual deficits: Secondary | ICD-10-CM | POA: Diagnosis not present

## 2017-05-02 DIAGNOSIS — Z7901 Long term (current) use of anticoagulants: Secondary | ICD-10-CM | POA: Diagnosis not present

## 2017-05-02 DIAGNOSIS — Z86711 Personal history of pulmonary embolism: Secondary | ICD-10-CM | POA: Diagnosis not present

## 2017-05-10 DIAGNOSIS — Z7901 Long term (current) use of anticoagulants: Secondary | ICD-10-CM | POA: Diagnosis not present

## 2017-05-10 DIAGNOSIS — F419 Anxiety disorder, unspecified: Secondary | ICD-10-CM | POA: Diagnosis not present

## 2017-05-10 DIAGNOSIS — E785 Hyperlipidemia, unspecified: Secondary | ICD-10-CM | POA: Diagnosis not present

## 2017-05-10 DIAGNOSIS — Z86711 Personal history of pulmonary embolism: Secondary | ICD-10-CM | POA: Diagnosis not present

## 2017-05-17 DIAGNOSIS — E785 Hyperlipidemia, unspecified: Secondary | ICD-10-CM | POA: Diagnosis not present

## 2017-05-17 DIAGNOSIS — Z7901 Long term (current) use of anticoagulants: Secondary | ICD-10-CM | POA: Diagnosis not present

## 2017-05-17 DIAGNOSIS — Z86711 Personal history of pulmonary embolism: Secondary | ICD-10-CM | POA: Diagnosis not present

## 2017-05-24 DIAGNOSIS — Z86711 Personal history of pulmonary embolism: Secondary | ICD-10-CM | POA: Diagnosis not present

## 2017-05-24 DIAGNOSIS — Z7901 Long term (current) use of anticoagulants: Secondary | ICD-10-CM | POA: Diagnosis not present

## 2017-06-10 DIAGNOSIS — Z86711 Personal history of pulmonary embolism: Secondary | ICD-10-CM | POA: Diagnosis not present

## 2017-06-10 DIAGNOSIS — Z7901 Long term (current) use of anticoagulants: Secondary | ICD-10-CM | POA: Diagnosis not present

## 2017-06-19 DIAGNOSIS — S46811D Strain of other muscles, fascia and tendons at shoulder and upper arm level, right arm, subsequent encounter: Secondary | ICD-10-CM | POA: Diagnosis not present

## 2017-06-19 DIAGNOSIS — M75101 Unspecified rotator cuff tear or rupture of right shoulder, not specified as traumatic: Secondary | ICD-10-CM | POA: Diagnosis not present

## 2017-06-19 DIAGNOSIS — M12811 Other specific arthropathies, not elsewhere classified, right shoulder: Secondary | ICD-10-CM | POA: Diagnosis not present

## 2017-06-24 DIAGNOSIS — Z86711 Personal history of pulmonary embolism: Secondary | ICD-10-CM | POA: Diagnosis not present

## 2017-06-24 DIAGNOSIS — Z7901 Long term (current) use of anticoagulants: Secondary | ICD-10-CM | POA: Diagnosis not present

## 2017-07-03 DIAGNOSIS — Z86711 Personal history of pulmonary embolism: Secondary | ICD-10-CM | POA: Diagnosis not present

## 2017-07-03 DIAGNOSIS — Z7901 Long term (current) use of anticoagulants: Secondary | ICD-10-CM | POA: Diagnosis not present

## 2017-07-17 DIAGNOSIS — Z86711 Personal history of pulmonary embolism: Secondary | ICD-10-CM | POA: Diagnosis not present

## 2017-07-17 DIAGNOSIS — Z7901 Long term (current) use of anticoagulants: Secondary | ICD-10-CM | POA: Diagnosis not present

## 2017-07-23 DIAGNOSIS — Z7901 Long term (current) use of anticoagulants: Secondary | ICD-10-CM | POA: Diagnosis not present

## 2017-07-23 DIAGNOSIS — Z86711 Personal history of pulmonary embolism: Secondary | ICD-10-CM | POA: Diagnosis not present

## 2017-07-31 DIAGNOSIS — Z86711 Personal history of pulmonary embolism: Secondary | ICD-10-CM | POA: Diagnosis not present

## 2017-07-31 DIAGNOSIS — Z7901 Long term (current) use of anticoagulants: Secondary | ICD-10-CM | POA: Diagnosis not present

## 2017-08-07 DIAGNOSIS — Z86711 Personal history of pulmonary embolism: Secondary | ICD-10-CM | POA: Diagnosis not present

## 2017-08-07 DIAGNOSIS — Z7901 Long term (current) use of anticoagulants: Secondary | ICD-10-CM | POA: Diagnosis not present

## 2017-08-12 DIAGNOSIS — Z8673 Personal history of transient ischemic attack (TIA), and cerebral infarction without residual deficits: Secondary | ICD-10-CM | POA: Diagnosis not present

## 2017-08-12 DIAGNOSIS — Z86711 Personal history of pulmonary embolism: Secondary | ICD-10-CM | POA: Diagnosis not present

## 2017-08-12 DIAGNOSIS — Z7901 Long term (current) use of anticoagulants: Secondary | ICD-10-CM | POA: Diagnosis not present

## 2017-08-12 DIAGNOSIS — M5030 Other cervical disc degeneration, unspecified cervical region: Secondary | ICD-10-CM | POA: Diagnosis not present

## 2017-08-12 DIAGNOSIS — F419 Anxiety disorder, unspecified: Secondary | ICD-10-CM | POA: Diagnosis not present

## 2017-08-12 DIAGNOSIS — Z23 Encounter for immunization: Secondary | ICD-10-CM | POA: Diagnosis not present

## 2017-09-10 DIAGNOSIS — M65341 Trigger finger, right ring finger: Secondary | ICD-10-CM | POA: Diagnosis not present

## 2017-09-10 DIAGNOSIS — M75101 Unspecified rotator cuff tear or rupture of right shoulder, not specified as traumatic: Secondary | ICD-10-CM | POA: Diagnosis not present

## 2017-09-10 DIAGNOSIS — M12811 Other specific arthropathies, not elsewhere classified, right shoulder: Secondary | ICD-10-CM | POA: Diagnosis not present

## 2017-09-16 DIAGNOSIS — R0789 Other chest pain: Secondary | ICD-10-CM | POA: Diagnosis not present

## 2017-09-16 DIAGNOSIS — Z86711 Personal history of pulmonary embolism: Secondary | ICD-10-CM | POA: Diagnosis not present

## 2017-09-16 DIAGNOSIS — N183 Chronic kidney disease, stage 3 (moderate): Secondary | ICD-10-CM | POA: Diagnosis not present

## 2017-09-16 DIAGNOSIS — J4530 Mild persistent asthma, uncomplicated: Secondary | ICD-10-CM | POA: Diagnosis not present

## 2017-09-17 ENCOUNTER — Other Ambulatory Visit: Payer: Self-pay | Admitting: Family Medicine

## 2017-09-17 DIAGNOSIS — R0789 Other chest pain: Secondary | ICD-10-CM

## 2017-09-18 ENCOUNTER — Ambulatory Visit
Admission: RE | Admit: 2017-09-18 | Discharge: 2017-09-18 | Disposition: A | Discharge status: Home / Self Care | Payer: Medicare Other | Source: Ambulatory Visit | Attending: Family Medicine | Admitting: Family Medicine

## 2017-09-18 DIAGNOSIS — I700 Atherosclerosis of aorta: Secondary | ICD-10-CM | POA: Diagnosis not present

## 2017-09-18 DIAGNOSIS — R0789 Other chest pain: Secondary | ICD-10-CM

## 2017-09-18 DIAGNOSIS — R079 Chest pain, unspecified: Secondary | ICD-10-CM | POA: Diagnosis not present

## 2017-09-18 MED ORDER — IOPAMIDOL (ISOVUE-370) INJECTION 76%
75.0000 mL | Freq: Once | INTRAVENOUS | Status: AC | PRN
  Administered 2017-09-18: 75 mL via INTRAVENOUS

## 2017-10-03 DIAGNOSIS — N5201 Erectile dysfunction due to arterial insufficiency: Secondary | ICD-10-CM | POA: Diagnosis not present

## 2017-10-03 DIAGNOSIS — E291 Testicular hypofunction: Secondary | ICD-10-CM | POA: Diagnosis not present

## 2017-11-12 DIAGNOSIS — E291 Testicular hypofunction: Secondary | ICD-10-CM | POA: Diagnosis not present

## 2017-11-12 DIAGNOSIS — R948 Abnormal results of function studies of other organs and systems: Secondary | ICD-10-CM | POA: Diagnosis not present

## 2017-11-18 DIAGNOSIS — E291 Testicular hypofunction: Secondary | ICD-10-CM | POA: Diagnosis not present

## 2017-11-18 DIAGNOSIS — N261 Atrophy of kidney (terminal): Secondary | ICD-10-CM | POA: Diagnosis not present

## 2017-11-18 DIAGNOSIS — N5201 Erectile dysfunction due to arterial insufficiency: Secondary | ICD-10-CM | POA: Diagnosis not present

## 2017-11-18 DIAGNOSIS — Z6821 Body mass index (BMI) 21.0-21.9, adult: Secondary | ICD-10-CM | POA: Diagnosis not present

## 2017-11-20 DIAGNOSIS — M75121 Complete rotator cuff tear or rupture of right shoulder, not specified as traumatic: Secondary | ICD-10-CM | POA: Diagnosis not present

## 2017-11-20 DIAGNOSIS — J4530 Mild persistent asthma, uncomplicated: Secondary | ICD-10-CM | POA: Diagnosis not present

## 2017-11-20 DIAGNOSIS — M5030 Other cervical disc degeneration, unspecified cervical region: Secondary | ICD-10-CM | POA: Diagnosis not present

## 2017-11-20 DIAGNOSIS — G894 Chronic pain syndrome: Secondary | ICD-10-CM | POA: Diagnosis not present

## 2017-11-27 DIAGNOSIS — M25811 Other specified joint disorders, right shoulder: Secondary | ICD-10-CM | POA: Diagnosis not present

## 2017-12-03 ENCOUNTER — Ambulatory Visit: Payer: Medicare Other | Admitting: Neurology

## 2017-12-03 ENCOUNTER — Encounter: Payer: Self-pay | Admitting: Neurology

## 2017-12-03 VITALS — BP 155/66 | HR 45 | Ht 71.0 in | Wt 256.0 lb

## 2017-12-03 DIAGNOSIS — G8929 Other chronic pain: Secondary | ICD-10-CM

## 2017-12-03 DIAGNOSIS — G4733 Obstructive sleep apnea (adult) (pediatric): Secondary | ICD-10-CM | POA: Diagnosis not present

## 2017-12-03 MED ORDER — AMITRIPTYLINE HCL 25 MG PO TABS: 25.0000 mg | ORAL_TABLET | Freq: Every day | ORAL | 6 refills | Status: DC

## 2017-12-03 NOTE — Patient Instructions (Addendum)
Start Amitriptyline at bedtime 25mg . In 2-3 weeks we can increase, email [place] and let me know how you are doing Sleep evaluation for untreated sleep apnea   Amitriptyline tablets What is this medicine? AMITRIPTYLINE (a mee TRIP ti leen) is used to treat pain. This medicine may be used for other purposes; ask your health care provider or pharmacist if you have questions. COMMON BRAND NAME(S): Elavil, Vanatrip What should I tell my health care provider before I take this medicine? They need to know if you have any of these conditions: -an alcohol problem -asthma, difficulty breathing -bipolar disorder or schizophrenia -difficulty passing urine, prostate trouble -glaucoma -heart disease or previous heart attack -liver disease -over active thyroid -seizures -thoughts or plans of suicide, a previous suicide attempt, or family history of suicide attempt -an unusual or allergic reaction to amitriptyline, other medicines, foods, dyes, or preservatives -pregnant or trying to get pregnant -breast-feeding How should I use this medicine? Take this medicine by mouth with a drink of water. Follow the directions on the prescription label. You can take the tablets with or without food. Take your medicine at regular intervals. Do not take it more often than directed. Do not stop taking this medicine suddenly except upon the advice of your doctor. Stopping this medicine too quickly may cause serious side effects or your condition may worsen. A special MedGuide will be given to you by the pharmacist with each prescription and refill. Be sure to read this information carefully each time. Talk to your pediatrician regarding the use of this medicine in children. Special care may be needed. Overdosage: If you think you have taken too much of this medicine contact a poison control center or emergency room at once. NOTE: This medicine is only for you. Do not share this medicine with others. What if I miss a  dose? If you miss a dose, take it as soon as you can. If it is almost time for your next dose, take only that dose. Do not take double or extra doses. What may interact with this medicine? Do not take this medicine with any of the following medications: -arsenic trioxide -certain medicines used to regulate abnormal heartbeat or to treat other heart conditions -cisapride -droperidol -halofantrine -linezolid -MAOIs like Carbex, Eldepryl, Marplan, Nardil, and Parnate -methylene blue -other medicines for mental depression -phenothiazines like perphenazine, thioridazine and chlorpromazine -pimozide -probucol -procarbazine -sparfloxacin -St. John's Wort -ziprasidone This medicine may also interact with the following medications: -atropine and related drugs like hyoscyamine, scopolamine, tolterodine and others -barbiturate medicines for inducing sleep or treating seizures, like phenobarbital -cimetidine -disulfiram -ethchlorvynol -thyroid hormones such as levothyroxine This list may not describe all possible interactions. Give your health care provider a list of all the medicines, herbs, non-prescription drugs, or dietary supplements you use. Also tell them if you smoke, drink alcohol, or use illegal drugs. Some items may interact with your medicine. What should I watch for while using this medicine? Tell your doctor if your symptoms do not get better or if they get worse. Visit your doctor or health care professional for regular checks on your progress. Because it may take several weeks to see the full effects of this medicine, it is important to continue your treatment as prescribed by your doctor. Patients and their families should watch out for new or worsening thoughts of suicide or depression. Also watch out for sudden changes in feelings such as feeling anxious, agitated, panicky, irritable, hostile, aggressive, impulsive, severely restless, overly excited and hyperactive,  or not being  able to sleep. If this happens, especially at the beginning of treatment or after a change in dose, call your health care professional. Dennis Bast may get drowsy or dizzy. Do not drive, use machinery, or do anything that needs mental alertness until you know how this medicine affects you. Do not stand or sit up quickly, especially if you are an older patient. This reduces the risk of dizzy or fainting spells. Alcohol may interfere with the effect of this medicine. Avoid alcoholic drinks. Do not treat yourself for coughs, colds, or allergies without asking your doctor or health care professional for advice. Some ingredients can increase possible side effects. Your mouth may get dry. Chewing sugarless gum or sucking hard candy, and drinking plenty of water will help. Contact your doctor if the problem does not go away or is severe. This medicine may cause dry eyes and blurred vision. If you wear contact lenses you may feel some discomfort. Lubricating drops may help. See your eye doctor if the problem does not go away or is severe. This medicine can cause constipation. Try to have a bowel movement at least every 2 to 3 days. If you do not have a bowel movement for 3 days, call your doctor or health care professional. This medicine can make you more sensitive to the sun. Keep out of the sun. If you cannot avoid being in the sun, wear protective clothing and use sunscreen. Do not use sun lamps or tanning beds/booths. What side effects may I notice from receiving this medicine? Side effects that you should report to your doctor or health care professional as soon as possible: -allergic reactions like skin rash, itching or hives, swelling of the face, lips, or tongue -anxious -breathing problems -changes in vision -confusion -elevated mood, decreased need for sleep, racing thoughts, impulsive behavior -eye pain -fast, irregular heartbeat -feeling faint or lightheaded, falls -feeling agitated, angry, or  irritable -fever with increased sweating -hallucination, loss of contact with reality -seizures -stiff muscles -suicidal thoughts or other mood changes -tingling, pain, or numbness in the feet or hands -trouble passing urine or change in the amount of urine -trouble sleeping -unusually weak or tired -vomiting -yellowing of the eyes or skin Side effects that usually do not require medical attention (report to your doctor or health care professional if they continue or are bothersome): -change in sex drive or performance -change in appetite or weight -constipation -dizziness -dry mouth -nausea -tired -tremors -upset stomach This list may not describe all possible side effects. Call your doctor for medical advice about side effects. You may report side effects to FDA at 1-800-FDA-1088. Where should I keep my medicine? Keep out of the reach of children. Store at room temperature between 20 and 25 degrees C (68 and 77 degrees F). Throw away any unused medicine after the expiration date. NOTE: This sheet is a summary. It may not cover all possible information. If you have questions about this medicine, talk to your doctor, pharmacist, or health care provider.  2018 [employer]/Gold Standard (2016-03-23 12:14:15)

## 2017-12-03 NOTE — Progress Notes (Signed)
[facility]    Provider:  Dr Jaynee Eagles Referring Provider: Harlan Stains, MD Primary Care Physician:  Harlan Stains, MD  CC: Neck pain.  Interval history 12/03/2017: Last emg/ncs confirmed radiculopathy, spinal stenosis with myelomalacia,  Multilevel degenerative  arthritic changes in the cervical spine. Last seen 12/2015. He had multiple opinions, saw [facility], saw orthopaedics, saw [place] [facility] and decided not to get cervical surgery due to risks. He continues to be in pain.  He has since had multiple rotator tears.  He tried Gabapentin, Lyrica and has tried opioids.  He went to pain management at [facility] and they "gave up on me".  He is here for chronic pain management, I think he would be better served with his pcp or another pain management center. He has been to physical therapy. He has pain that radiates from the right neck down the arm, sharp, shooting. Tried Topamax in the past. Don;t have much to offer here in Neurology for this patient, can try Nortriptyline or Amitriptyline but recommend pain center. He has untreated sleep apnea, discussed this can cause increased risk of stroke. He has daily headache. Highly encouraged seeing a sleep doctor.   FWY:OVZCH P Kirt is a 77 y.o. male here as a referral from Dr. Dema Severin and Dr. Melina Schools for neck pain. He is a 77 year old male with a PMHx DVT and PE on chronic anticoagulation (coumadin), CKD, lacunar strokes in 2015 on MRI brain etiology small-vessel vs cardioembolic and retinal occlusion, headaches, hyperlipidemia, depression, neck trauma 1970 at c3-c4. Pain is very bad, the right arm is severely painful. Started 5-7 years ago and is progressive. He gets pain that runs all the way from the shoulder to the middle 2 fingers. He has not had emg/ncs. He is in chronic pain. Also reports neck stiffness, impaired range of motion, numbness and tingling of the right arm and upper extremity weakness. Radiating pain  is sharp. Symptoms are worsening. It waxes and wanes. He is 3-4 pain at the best and 10/10 at the worsr. At the most he has problems moving the arm due to pain, can be 10/10 pain. Nothing helps. Sleeping on it and use makes it worse. No other associated symptoms. He is having no symptoms in the other limbs. He also endorses weakness in the arm. The pain is continuous, The weakness is progressive. He has tingling and numbness in digit 3-4 in the right hand.   MRI cervical spine 12/20/2015: personally reviewed images and agree with the following  Abnormal MRI scan of the cervical spine showing severe degenerative changes throughout most noticeable at C3-4 where there is severe bilateral foraminal stenosis and canal stenosis with mild cord atrophy. There is also prominent asymmetric foraminal narrowing at C4-5 and C6-7 resulting in right C5 and possibly right C7 nerve root impingement.  Reviewed notes, labs and imaging from outside physicians, which showed:   Review of records from Dr. Rolena Infante, cervical spine surgery was discussed with ACDF and C3-C7 fusion which is an extensive operation.   MRI of the cervical spine images were very low in quality. Impression of MRI of the cervical spine in January showed canal stenosis at C3-C4 with cord atrophy, severe bilateral foraminal stenosis. At C4-C5 showed borderline canal stenosis, grade 1 spondylolisthesis of C4 and C5, posterior endplate spurs asymmetrically affecting the right C5 root, severe right foraminal stenosis. At C5-C6 mild canal stenosis with cord contact, moderate disc bulging, grade 1 spondylolisthesis of C5 on C6 and moderate to  severe left foraminal stenosis. At C6-C7 and mild canal stenosis and cord deformity with asymmetric mass effect on right C7 root. C7-T1 small right posterior lateral protrusion. C2-C3 mild canal stenosis at cord contact. Facet ankylosis is appreciated at multiple levels. Personally reviewed images and agree with  findings.  CT of the head 11/25/2015 (personally reviewed images and agree with the following):  FINDINGS: There is chronic diffuse atrophy. There is no midline shift, hydrocephalus or mass. No acute hemorrhage or acute transcortical infarct is identified. The bony calvarium is intact. The visualized sinuses are clear. There is frontal scalp swelling/ hematoma.  IMPRESSION: No focal acute intracranial abnormality identified.  Chronic diffuse atrophy.  Patient was seen by [facility] in August 2015. Per Dr. 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 He had an MRI of the brain without contrast performed on 06/30/14, which revealed three subcortical acute infarcts in the right parietal lobe. MRA of the head was normal. He underwent a stroke workup. Carotid duplex showed bilateral atherosclerotic plaque but no hemodynamically significant stenosis in the internal carotids. Sed rate was 11, INR was 3.1, CRP was 2.5. A recent 2D Echocardiogram was performed on 06/14/14, which revealed a normal LVEF of 55-60% with no wall motion abnormalities or cardiac source of emboli. He is compliant with his Coumadin. Of note, recent CT scan of the chest performed in June 2015 was negative for recurrent PE. He has been evaluated by a hematologist in the past, with hypercoagulable panel not too revealing for a specific thrombophilia. The headache is now dull and he takes Tylenol for it.  He has history of DVT and PE on Coumadin with supratherapeutic INR. He has a history of skull fracture as a child, in which he suffered from chronic headaches up until his early 48s. He had a neck injury at age 75, in which he had a myelopathy and has residual hyperreflexia and numbness in the right leg. He has arthritis with bilateral knee surgeries.    Social History   Socioeconomic History  . Marital  status: Married    Spouse name: Not on file  . Number of children: Not on file  . Years of education: Not on file  . Highest education level: Not on file  Social Needs  . Financial resource strain: Not on file  . Food insecurity - worry: Not on file  . Food insecurity - inability: Not on file  . Transportation needs - medical: Not on file  . Transportation needs - non-medical: Not on file  Occupational History  . Occupation: CNA  Tobacco Use  . Smoking status: Former Smoker    Packs/day: 1.00    Years: 25.00    Pack years: 25.00    Types: Cigarettes    Last attempt to quit: 07/13/1980    Years since quitting: 37.4  . Smokeless tobacco: Never Used  Substance and Sexual Activity  . Alcohol use: Yes    Comment: RARE  . Drug use: No  . Sexual activity: Yes    Partners: Female  Other Topics Concern  . Not on file  Social History Narrative  . Not on file    Family History  Problem Relation Age of Onset  . Deep vein thrombosis Father   . Stroke Father   . Stroke Brother   . Colon cancer Mother   . Migraines Neg Hx     Past Medical  History:  Diagnosis Date  . Anticoagulated on warfarin   . Anxiety   . Arthritis    KNEES, NECK, SHOULDERS, HANDS  . Asthma   . Cancer ([place])    skin cancer-  2 basal cell, 1 squamous cell  . GERD (gastroesophageal reflux disease)   . Gout, arthritis    BILATERAL GREAT TOE-- PER PT STABLE  . H/O transfusion of packed red blood cells    as a child/ s/p tonsillectomy  . History of pulmonary embolism   . Lung nodule    BENIGN RIGHT UPPER--  CHEST CT 08-22-2011  . OSA (obstructive sleep apnea)    NO CPAP -states dr said possibly related to asthma  . S/P dilatation of esophageal stricture   . Stroke [facility])     Past Surgical History:  Procedure Laterality Date  . CARPAL TUNNEL RELEASE Right 2010  . JOINT REPLACEMENT    . KNEE ARTHROSCOPY Right   . KNEE ARTHROSCOPY Right   . SHOULDER ARTHROSCOPY Right   . TONSILLECTOMY  AGE 65  . TOTAL  KNEE ARTHROPLASTY Left 10-15-2008  . TOTAL KNEE ARTHROPLASTY Right 01/18/2014   Procedure: RIGHT TOTAL KNEE ARTHROPLASTY;  Surgeon: Gearlean Alf, MD;  Location: [facility];  Service: Orthopedics;  Laterality: Right;  . WRIST GANGLION EXCISION Right 1964    Current Outpatient Medications  Medication Sig Dispense Refill  . acetaminophen (TYLENOL) 500 MG tablet Take 1,000 mg by mouth 2 (two) times daily. And prn    . albuterol (PROVENTIL HFA;VENTOLIN HFA) 108 (90 BASE) MCG/ACT inhaler Inhale 2 puffs into the lungs every 6 (six) hours as needed for wheezing or shortness of breath.    [name] albuterol (PROVENTIL) (2.5 MG/3ML) 0.083% nebulizer solution Take 2.5 mg by nebulization every 6 (six) hours as needed for wheezing or shortness of breath.    . allopurinol (ZYLOPRIM) 300 MG tablet Take 300 mg by mouth daily.    . ClomiPHENE Citrate (CLOMID PO) Take 1 tablet by mouth daily. Unknown dosage to help increase testosterone.    . escitalopram (LEXAPRO) 20 MG tablet Take 20 mg by mouth every 7 (seven) days.     [name] gabapentin (NEURONTIN) 300 MG capsule Take 1 capsule (300 mg total) by mouth 3 (three) times daily. 90 capsule 11  . guaiFENesin (MUCINEX) 600 MG 12 hr tablet Take 1,200 mg by mouth 2 (two) times daily as needed (for cough).    . LORazepam (ATIVAN) 0.5 MG tablet Take 1-2 tablets 30 minutes prior to test. Must have driver to and from test. Medication can cause sedation. 2 tablet 0  . methocarbamol (ROBAXIN) 500 MG tablet Take 1 tablet (500 mg total) by mouth every 6 (six) hours as needed for muscle spasms. 80 tablet 0  . Multiple Vitamin (MULTIVITAMIN WITH MINERALS) TABS tablet Take 1 tablet by mouth daily.    [name] QVAR 80 MCG/ACT inhaler INHALE TWO PUFFS BY MOUTH TWICE DAILY 8.7 g 1  . simvastatin (ZOCOR) 10 MG tablet Take 10 mg by mouth daily.  0  . tadalafil (CIALIS) 20 MG tablet Take 20 mg by mouth daily as needed for erectile dysfunction.    [name] warfarin (COUMADIN) 5 MG tablet Take 1 tablet (5 mg  total) by mouth every evening. Take Coumadin for three weeks for postoperative protocol and then the patient may resume their previous Coumadin home regimen.  The dose may need to be adjusted based upon the INR.  Please follow the INR and titrate Coumadin dose for a therapeutic  range between 2.0 and 3.0 INR.  After completing the three weeks of Coumadin, the patient may resume their previous Coumadin home regimen. (Patient taking differently: Take 5-7.5 mg by mouth as directed. 7.5 mg on Tues. Thurs. Sat,.& Sun 5 mg on all other days.) 45 tablet 0   No current facility-administered medications for this visit.     Allergies as of 12/03/2017 - Review Complete 06/20/2016  Allergen Reaction Noted  . Garlic Anaphylaxis 40/98/1191  . Onion Anaphylaxis 01/11/2014  . Codeine Other (See Comments) 03/03/2012  . Hydrocodone  12/12/2015  . Novocain [procaine] Nausea Only 07/13/2013    Vitals: There were no vitals taken for this visit. Last Weight:  Wt Readings from Last 1 Encounters:  12/12/15 233 lb (105.7 kg)   Last Height:   Ht Readings from Last 1 Encounters:  12/12/15 5\' 11"  (1.803 m)    Physical exam: Exam: Gen: NAD, conversant, well nourised, obese, well groomed                     CV: RRR, no MRG. No Carotid Bruits. No peripheral edema, warm, nontender Eyes: Conjunctivae clear without exudates or hemorrhage MSK: Decreased range of motion in the cervical spine  Neuro: Detailed Neurologic Exam  Speech:    Speech is normal; fluent and spontaneous with normal comprehension.  Cognition:    The patient is oriented to person, place, and time;     recent and remote memory intact;     language fluent;     normal attention, concentration,     fund of knowledge Cranial Nerves:    The pupils are equal, round, and reactive to light. The fundi are nml. Visual fields are full to finger confrontation. Extraocular movements are intact. Trigeminal sensation is intact and the muscles of  mastication are normal. The face is symmetric. The palate elevates in the midline. Hearing intact. Voice is normal. Shoulder shrug is normal. The tongue has normal motion without fasciculations.   Coordination:    Normal finger to nose and heel to shin.   Gait:    Imbalance, Difficulty with tandem, normal stance, good arm swing and turn, not wide-based or narrow-base. He cannot heel or toe walk.  Motor Observation:    No asymmetry, no atrophy, and no involuntary movements noted. Tone:    Normal muscle tone.    Posture:    Posture is normal. normal erect    Strength:    Strength is V/V in the upper and lower limbs.      Sensation: dec sensation right 4th digit     Reflex Exam:  DTR's: Decr right deltoid. Otherwise deep tendon reflexes in the upper and lower extremities are brisk bilaterally.  Positive Hoffmann sign in the bilateral upper extremities. Toes:    The toes are upgoing bilaterally.   Clonus:    Clonus  AJs.     Assessment/Plan:   Jathen Sudano Dehnert is a 77 y.o. male here as a referral from Dr. Dema Severin and Dr. Melina Schools for neck pain. He is a 77 year old male with a PMHx DVT and PE on chronic anticoagulation (coumadin), CKD, lacunar strokes in 2015 on MRI brain, headaches, hyperlipidemia, depression, neck trauma 1970 at c3-c4. He has chronic neck pain with radicular symptoms into the right hand. He has been evaluated by Dr. Rolena Infante and extensive cervical surgery was discussed including ACDF and C3-C7 fusion. MRI of the cervical spine shows multilevel significant cervical degenerative disc disease  and spinal cord  atrophy at C3-C4 possibly with contribution from a previous injury.  Chronic Pain: - He had multiple opinions, saw [facility], saw orthopaedics, saw [place] [facility] and decided not to get cervical surgery due to risks. He continues to be in pain.   -  He tried Gabapentin, Lyrica and has tried opioids.  He went to pain management at [facility] and they  "gave up on me".  He is here for chronic pain management - can try Amitriptyline however patient should be seen at a different formal pain clinic, he needs chronic pain management which I do not provide.   Untreated sleep apnea: - Snoring, excessive daytime fatigue, diagnosed with sleep apnea in the past untreated. Highly recommend, discussed sequelae of untreated sleep apnea including strokes.  Orders Placed This Encounter  Procedures  . Ambulatory referral to Sleep Studies     CC: Cynthia white  Sarina Ill, MD  [facility] [address] [place], [place] [address]  Phone [phone] Fax [phone]  A total of 25 minutes was spent face-to-face with this patient. Over half this time was spent on counseling patient on the sleep apnea, chronic paindiagnosis and different diagnostic and therapeutic options available.

## 2017-12-06 ENCOUNTER — Telehealth: Payer: Self-pay | Admitting: *Deleted

## 2017-12-06 NOTE — Telephone Encounter (Signed)
Amitriptyline PA completed on Cover My Meds  KEY: [id number]  Your information has been submitted to [place]. [employer] will review the request and notify you of the determination decision directly, typically within 3 business days of your submission and once all necessary information is received. You will also receive your request decision electronically. To check for an update later, open the request again from your dashboard. If [employer] [place] has not responded within the specified timeframe or if you have any questions about your PA submission, contact [place] directly at [facility]) [phone] or ([place]) [phone].

## 2017-12-09 NOTE — Telephone Encounter (Signed)
Received faxed APPROVAL of Amitriptyline 25 mg tabs. Approval ends on 12/06/2018 provided patient remains enrolled in the program, provider continues to prescribe this drug, drug remains on formulary, drug remains on same formulary tier, no change in prior review requirements for the drug, and drug continues to be safe for treating the member's condition.   [place] and informed them of approval of Amitriptyline.

## 2017-12-12 ENCOUNTER — Encounter: Payer: Self-pay | Admitting: Emergency Medicine

## 2017-12-12 ENCOUNTER — Ambulatory Visit: Payer: Medicare Other | Admitting: Emergency Medicine

## 2017-12-12 VITALS — BP 124/84 | HR 90 | Ht 71.5 in | Wt 250.0 lb

## 2017-12-12 DIAGNOSIS — R0609 Other forms of dyspnea: Secondary | ICD-10-CM

## 2017-12-12 DIAGNOSIS — R0600 Dyspnea, unspecified: Secondary | ICD-10-CM

## 2017-12-12 NOTE — Addendum Note (Signed)
Addended by: Dolores Lory on: 12/12/2017 03:47 PM   Modules accepted: Orders

## 2017-12-12 NOTE — Progress Notes (Signed)
Subjective:    Patient ID: [name], male    DOB: 11/03/1941, 77 y.o.   MRN: [id number]  HPI 77 year old former smoker (25 pack years) with a history of esophageal stricture, DVT and recurrent pulmonary embolism first in 1981, esophageal reflux, CVA, cervical stenosis, single kidney, chronic pain.  He carries a diagnosis of asthma although pulmonary function testing 06/08/14 did not show any overt obstruction or bronchodilator response or hyperinflation.   He underwent methacholine challenge 12/16/06 that I have reviewed. Shows a response to methacholine with a PD (-20) of approximately 11.8  He is referred today with shortness of breath and heaviness in chest, notices worse when walking a distance like shopping. Difficulty climbing stairs. Becomes tachypneic. Uses combivent at times like with long walk. Also on Qvar bid. He doesn' t believe that he has needed pred for breathing since 2014  CT-PA 09/18/17 reviewed by me > no recurrent PE, no change stable pulmonary nodule.    Review of Systems  Constitutional: Positive for unexpected weight change. Negative for fever.  HENT: Negative for congestion, dental problem, ear pain, nosebleeds, postnasal drip, rhinorrhea, sinus pressure, sneezing, sore throat and trouble swallowing.   Eyes: Negative for redness and itching.  Respiratory: Positive for cough and shortness of breath. Negative for chest tightness and wheezing.   Cardiovascular: Positive for chest pain. Negative for palpitations and leg swelling.  Gastrointestinal: Negative for nausea and vomiting.  Genitourinary: Negative for dysuria.  Musculoskeletal: Negative for joint swelling.  Skin: Negative for rash.  Neurological: Negative for headaches.  Hematological: Does not bruise/bleed easily.  Psychiatric/Behavioral: Positive for dysphoric mood. The patient is not nervous/anxious.    Past Medical History:  Diagnosis Date  . Anticoagulated on warfarin   . Anxiety   .  Arthritis    KNEES, NECK, SHOULDERS, HANDS  . Asthma   . Biceps tendon tear    right  . Cancer ([place])    skin cancer-  2 basal cell, 1 squamous cell  . GERD (gastroesophageal reflux disease)   . Gout, arthritis    BILATERAL GREAT TOE-- PER PT STABLE  . H/O transfusion of packed red blood cells    as a child/ s/p tonsillectomy  . History of pulmonary embolism   . Lung nodule    BENIGN RIGHT UPPER--  CHEST CT 08-22-2011  . OSA (obstructive sleep apnea)    NO CPAP -states dr said possibly related to asthma  . S/P dilatation of esophageal stricture   . Stroke ([place])   . Torn rotator cuff    Right x 3     Family History  Problem Relation Age of Onset  . Deep vein thrombosis Father   . Stroke Father   . Stroke Brother   . Colon cancer Mother   . Migraines Neg Hx      Social History   Socioeconomic History  . Marital status: Married    Spouse name: Not on file  . Number of children: 4  . Years of education: some master's classes  . Highest education level: Bachelor's degree (e.g., BA, AB, BS)  Social Needs  . Financial resource strain: Not on file  . Food insecurity - worry: Not on file  . Food insecurity - inability: Not on file  . Transportation needs - medical: Not on file  . Transportation needs - non-medical: Not on file  Occupational History  . Occupation: CNA  Tobacco Use  . Smoking status: Former Smoker    Packs/day: 1.00  Years: 25.00    Pack years: 25.00    Types: Cigarettes    Last attempt to quit: 07/13/1980    Years since quitting: 37.4  . Smokeless tobacco: Never Used  Substance and Sexual Activity  . Alcohol use: Yes    Comment: RARE  . Drug use: No  . Sexual activity: Yes    Partners: Female  Other Topics Concern  . Not on file  Social History Narrative   Lives at home with his wife & youngest daughter and her family   Right handed   Drinks 2 cups of coffee daily, occasional cup of tea     Allergies  Allergen Reactions  . Garlic  Anaphylaxis  . Onion Anaphylaxis  . Codeine Other (See Comments)    "PASSES OUT"  . Hydrocodone     Pt states that he is allergic to synthetic hydrocodone.  . Novocain [Procaine] Nausea Only     Outpatient Medications Prior to Visit  Medication Sig Dispense Refill  . acetaminophen (TYLENOL) 500 MG tablet Take 1,000 mg by mouth 2 (two) times daily. And prn    . albuterol (PROVENTIL HFA;VENTOLIN HFA) 108 (90 BASE) MCG/ACT inhaler Inhale 2 puffs into the lungs every 6 (six) hours as needed for wheezing or shortness of breath.    [name] albuterol (PROVENTIL) (2.5 MG/3ML) 0.083% nebulizer solution Take 2.5 mg by nebulization every 6 (six) hours as needed for wheezing or shortness of breath.    . allopurinol (ZYLOPRIM) 300 MG tablet Take 300 mg by mouth daily.    [name] apixaban (ELIQUIS) 5 MG TABS tablet Take 5 mg by mouth 2 (two) times daily.    . beclomethasone (QVAR REDIHALER) 80 MCG/ACT inhaler Inhale 2 puffs into the lungs 2 (two) times daily.    [name] escitalopram (LEXAPRO) 20 MG tablet Take 20 mg by mouth daily.     . methocarbamol (ROBAXIN) 750 MG tablet Take 750 mg by mouth 3 (three) times daily.    . Multiple Vitamin (MULTIVITAMIN WITH MINERALS) TABS tablet Take 1 tablet by mouth daily.    . simvastatin (ZOCOR) 10 MG tablet Take 10 mg by mouth daily.  0  . tadalafil (CIALIS) 20 MG tablet Take 20 mg by mouth daily as needed for erectile dysfunction.    [name] zolpidem (AMBIEN) 10 MG tablet Take 10 mg by mouth at bedtime as needed for sleep.    . pregabalin (LYRICA) 150 MG capsule Take 150 mg by mouth 3 (three) times daily.    [name] amitriptyline (ELAVIL) 25 MG tablet Take 1 tablet (25 mg total) by mouth at bedtime. 30 tablet 6  . TESTOSTERONE IM Inject 20 mg into the muscle every 14 (fourteen) days.     No facility-administered medications prior to visit.         Objective:   Physical Exam Vitals:   12/12/17 1457 12/12/17 1458  BP:  124/84  Pulse:  90  SpO2:  96%  Weight: 250 lb (113.4 kg)     Height: 5' 11.5" (1.816 m)    Gen: Pleasant, obese man, in no distress,  normal affect  ENT: No lesions,  mouth clear,  oropharynx clear, no postnasal drip  Neck: No JVD, no stridor  Lungs: No use of accessory muscles, no crackles or wheezes  Cardiovascular: RRR, heart sounds normal, no murmur or gallops, no peripheral edema  Musculoskeletal: No deformities, no cyanosis or clubbing  Neuro: alert, non focal  Skin: Warm, no lesions or rashes     Assessment &  Plan:  Dyspnea Associated with chest heaviness, appears to be responsive to Combivent.  His spirometry is negative for obstruction, he does not have a bronchodilator response, and his methacholine challenge 12/16/06 was negative.  Etiology of his dyspnea not clear to me he does describe some increased dyspnea with weight gain and improvement when he has lost the weight.  He also probably has obstructive sleep apnea unclear whether he may have secondary pulmonary hypertension in the setting of this and also his chronic pulmonary emboli.  I will for a methacholine challenge and then expand workup depending on the results.  Continue his bronchodilators for now.  Please continue Qvar twice a day as you have been taking it Please keep Combivent available to use 2 puffs up to every 6 hours if needed for shortness of breath, wheezing, chest tightness. Continue your Eliquis as you have been taking it Agree with following up with Dr. Maxwell Caul to discuss a repeat sleep study and obstructive sleep apnea. We will perform a methacholine challenge Follow with Dr Lamonte Sakai in 1 month or next available.  Baltazar Apo, MD, PhD 12/12/2017, 3:45 PM [facility] [phone] or if no answer [phone]

## 2017-12-12 NOTE — Assessment & Plan Note (Signed)
Associated with chest heaviness, appears to be responsive to Combivent.  His spirometry is negative for obstruction, he does not have a bronchodilator response, and his methacholine challenge 12/16/06 was negative.  Etiology of his dyspnea not clear to me he does describe some increased dyspnea with weight gain and improvement when he has lost the weight.  He also probably has obstructive sleep apnea unclear whether he may have secondary pulmonary hypertension in the setting of this and also his chronic pulmonary emboli.  I will for a methacholine challenge and then expand workup depending on the results.  Continue his bronchodilators for now.  Please continue Qvar twice a day as you have been taking it Please keep Combivent available to use 2 puffs up to every 6 hours if needed for shortness of breath, wheezing, chest tightness. Continue your Eliquis as you have been taking it Agree with following up with Dr. Maxwell Caul to discuss a repeat sleep study and obstructive sleep apnea. We will perform a methacholine challenge Follow with Dr Lamonte Sakai in 1 month or next available.

## 2017-12-12 NOTE — Patient Instructions (Addendum)
Please continue Qvar twice a day as you have been taking it Please keep Combivent available to use 2 puffs up to every 6 hours if needed for shortness of breath, wheezing, chest tightness. Continue your Eliquis as you have been taking it Agree with following up with Dr. Maxwell Caul to discuss a repeat sleep study and obstructive sleep apnea. We will perform a methacholine challenge Follow with Dr Lamonte Sakai in 1 month or next available.

## 2017-12-20 DIAGNOSIS — L821 Other seborrheic keratosis: Secondary | ICD-10-CM | POA: Diagnosis not present

## 2017-12-20 DIAGNOSIS — Z23 Encounter for immunization: Secondary | ICD-10-CM | POA: Diagnosis not present

## 2017-12-20 DIAGNOSIS — Z85828 Personal history of other malignant neoplasm of skin: Secondary | ICD-10-CM | POA: Diagnosis not present

## 2017-12-20 DIAGNOSIS — L570 Actinic keratosis: Secondary | ICD-10-CM | POA: Diagnosis not present

## 2017-12-23 ENCOUNTER — Ambulatory Visit (HOSPITAL_COMMUNITY)
Admission: RE | Admit: 2017-12-23 | Discharge: 2017-12-23 | Disposition: A | Discharge status: Home / Self Care | Payer: Medicare Other | Source: Ambulatory Visit | Attending: Emergency Medicine | Admitting: Emergency Medicine

## 2017-12-23 DIAGNOSIS — R0609 Other forms of dyspnea: Secondary | ICD-10-CM | POA: Diagnosis not present

## 2017-12-23 DIAGNOSIS — R0600 Dyspnea, unspecified: Secondary | ICD-10-CM

## 2017-12-23 LAB — PULMONARY FUNCTION TEST
FEF 25-75 Post: 2.66 L/sec
FEF 25-75 Pre: 2.59 L/sec
FEF2575-%CHANGE-POST: 2 %
FEF2575-%Pred-Post: 117 %
FEF2575-%Pred-Pre: 114 %
FEV1-%Change-Post: 0 %
FEV1-%Pred-Post: 97 %
FEV1-%Pred-Pre: 97 %
FEV1-POST: 3 L
FEV1-Pre: 3.01 L
FEV1FVC-%CHANGE-POST: 4 %
FEV1FVC-%PRED-PRE: 106 %
FEV6-%Change-Post: -4 %
FEV6-%PRED-POST: 92 %
FEV6-%Pred-Pre: 96 %
FEV6-Post: 3.7 L
FEV6-Pre: 3.87 L
FEV6FVC-%CHANGE-POST: 0 %
FEV6FVC-%Pred-Post: 105 %
FEV6FVC-%Pred-Pre: 106 %
FVC-%Change-Post: -4 %
FVC-%Pred-Post: 87 %
FVC-%Pred-Pre: 91 %
FVC-PRE: 3.89 L
FVC-Post: 3.72 L
PRE FEV6/FVC RATIO: 100 %
Post FEV1/FVC ratio: 81 %
Post FEV6/FVC ratio: 100 %
Pre FEV1/FVC ratio: 78 %

## 2017-12-23 MED ORDER — METHACHOLINE 1 MG/ML NEB SOLN
2.0000 mL | Freq: Once | RESPIRATORY_TRACT | Status: AC
  Administered 2017-12-23: 2 mg via RESPIRATORY_TRACT
  Filled 2017-12-23: qty 2

## 2017-12-23 MED ORDER — METHACHOLINE 16 MG/ML NEB SOLN
2.0000 mL | Freq: Once | RESPIRATORY_TRACT | Status: AC
  Administered 2017-12-23: 32 mg via RESPIRATORY_TRACT
  Filled 2017-12-23: qty 2

## 2017-12-23 MED ORDER — SODIUM CHLORIDE 0.9 % IN NEBU
3.0000 mL | INHALATION_SOLUTION | Freq: Once | RESPIRATORY_TRACT | Status: AC
  Administered 2017-12-23: 3 mL via RESPIRATORY_TRACT
  Filled 2017-12-23: qty 3

## 2017-12-23 MED ORDER — METHACHOLINE 0.25 MG/ML NEB SOLN
2.0000 mL | Freq: Once | RESPIRATORY_TRACT | Status: AC
  Administered 2017-12-23: 0.5 mg via RESPIRATORY_TRACT
  Filled 2017-12-23: qty 2

## 2017-12-23 MED ORDER — ALBUTEROL SULFATE (2.5 MG/3ML) 0.083% IN NEBU
2.5000 mg | INHALATION_SOLUTION | Freq: Once | RESPIRATORY_TRACT | Status: AC
  Administered 2017-12-23: 2.5 mg via RESPIRATORY_TRACT

## 2017-12-23 MED ORDER — METHACHOLINE 4 MG/ML NEB SOLN
2.0000 mL | Freq: Once | RESPIRATORY_TRACT | Status: AC
  Administered 2017-12-23: 8 mg via RESPIRATORY_TRACT
  Filled 2017-12-23: qty 2

## 2017-12-23 MED ORDER — METHACHOLINE 0.0625 MG/ML NEB SOLN
2.0000 mL | Freq: Once | RESPIRATORY_TRACT | Status: AC
  Administered 2017-12-23: 0.125 mg via RESPIRATORY_TRACT
  Filled 2017-12-23: qty 2

## 2018-01-02 DIAGNOSIS — G4733 Obstructive sleep apnea (adult) (pediatric): Secondary | ICD-10-CM | POA: Diagnosis not present

## 2018-01-02 DIAGNOSIS — E291 Testicular hypofunction: Secondary | ICD-10-CM | POA: Diagnosis not present

## 2018-01-03 HISTORY — PX: TRANSTHORACIC ECHOCARDIOGRAM: SHX275

## 2018-01-10 ENCOUNTER — Ambulatory Visit: Payer: Medicare Other | Admitting: Emergency Medicine

## 2018-01-10 ENCOUNTER — Encounter: Payer: Self-pay | Admitting: Emergency Medicine

## 2018-01-10 VITALS — BP 122/82 | HR 106 | Ht 72.0 in | Wt 246.0 lb

## 2018-01-10 DIAGNOSIS — R0600 Dyspnea, unspecified: Secondary | ICD-10-CM | POA: Diagnosis not present

## 2018-01-10 DIAGNOSIS — R0602 Shortness of breath: Secondary | ICD-10-CM | POA: Diagnosis not present

## 2018-01-10 NOTE — Progress Notes (Signed)
Subjective:    Patient ID: [name], male    DOB: [birth year], 77 y.o.   MRN: [id number]  Shortness of Breath  Associated symptoms include chest pain. Pertinent negatives include no ear pain, fever, headaches, leg swelling, rash, rhinorrhea, sore throat, vomiting or wheezing.   77 year old former smoker (25 pack years) with a history of esophageal stricture, DVT and recurrent pulmonary embolism first in 1981, esophageal reflux, CVA, cervical stenosis, single kidney, chronic pain.  He carries a diagnosis of asthma although pulmonary function testing 06/08/14 did not show any overt obstruction or bronchodilator response or hyperinflation.   He underwent methacholine challenge 12/16/06 that I have reviewed. Shows a response to methacholine with a PD (-20) of approximately 11.8  He is referred today with shortness of breath and heaviness in chest, notices worse when walking a distance like shopping. Difficulty climbing stairs. Becomes tachypneic. Uses combivent at times like with long walk. Also on Qvar bid. He doesn' t believe that he has needed pred for breathing since 2014  CT-PA 09/18/17 reviewed by me > no recurrent PE, no change stable pulmonary nodule.   ROV 01/10/18 --she follows up today for further evaluation of his dyspnea.  He has a history of recurrent PE on Eliquis, GERD, tobacco use.  Also suspected obstructive sleep apnea, not yet established.  He underwent a methacholine challenge on 12/23/17 that I have reviewed.  This does not show any methacholine hyperresponsiveness, nor does it show any obstructive lung disease.    Review of Systems  Constitutional: Positive for unexpected weight change. Negative for fever.  HENT: Negative for congestion, dental problem, ear pain, nosebleeds, postnasal drip, rhinorrhea, sinus pressure, sneezing, sore throat and trouble swallowing.   Eyes: Negative for redness and itching.  Respiratory: Positive for cough and shortness of breath. Negative  for chest tightness and wheezing.   Cardiovascular: Positive for chest pain. Negative for palpitations and leg swelling.  Gastrointestinal: Negative for nausea and vomiting.  Genitourinary: Negative for dysuria.  Musculoskeletal: Negative for joint swelling.  Skin: Negative for rash.  Neurological: Negative for headaches.  Hematological: Does not bruise/bleed easily.  Psychiatric/Behavioral: Positive for dysphoric mood. The patient is not nervous/anxious.    Past Medical History:  Diagnosis Date  . Anticoagulated on warfarin   . Anxiety   . Arthritis    KNEES, NECK, SHOULDERS, HANDS  . Asthma   . Biceps tendon tear    right  . Cancer ([place])    skin cancer-  2 basal cell, 1 squamous cell  . GERD (gastroesophageal reflux disease)   . Gout, arthritis    BILATERAL GREAT TOE-- PER PT STABLE  . H/O transfusion of packed red blood cells    as a child/ s/p tonsillectomy  . History of pulmonary embolism   . Lung nodule    BENIGN RIGHT UPPER--  CHEST CT 08-22-2011  . OSA (obstructive sleep apnea)    NO CPAP -states dr said possibly related to asthma  . S/P dilatation of esophageal stricture   . Stroke ([place])   . Torn rotator cuff    Right x 3     Family History  Problem Relation Age of Onset  . Deep vein thrombosis Father   . Stroke Father   . Stroke Brother   . Colon cancer Mother   . Migraines Neg Hx      Social History   Socioeconomic History  . Marital status: Married    Spouse name: Not on file  . Number  of children: 4  . Years of education: some master's classes  . Highest education level: Bachelor's degree (e.g., BA, AB, BS)  Social Needs  . Financial resource strain: Not on file  . Food insecurity - worry: Not on file  . Food insecurity - inability: Not on file  . Transportation needs - medical: Not on file  . Transportation needs - non-medical: Not on file  Occupational History  . Occupation: CNA  Tobacco Use  . Smoking status: Former Smoker    Packs/day:  1.00    Years: 25.00    Pack years: 25.00    Types: Cigarettes    Last attempt to quit: 07/13/1980    Years since quitting: 37.5  . Smokeless tobacco: Never Used  Substance and Sexual Activity  . Alcohol use: Yes    Comment: RARE  . Drug use: No  . Sexual activity: Yes    Partners: Female  Other Topics Concern  . Not on file  Social History Narrative   Lives at home with his wife & youngest daughter and her family   Right handed   Drinks 2 cups of coffee daily, occasional cup of tea     Allergies  Allergen Reactions  . Garlic Anaphylaxis  . Onion Anaphylaxis  . Codeine Other (See Comments)    "PASSES OUT"  . Hydrocodone     Pt states that he is allergic to synthetic hydrocodone.  . Novocain [Procaine] Nausea Only     Outpatient Medications Prior to Visit  Medication Sig Dispense Refill  . acetaminophen (TYLENOL) 500 MG tablet Take 1,000 mg by mouth 2 (two) times daily. And prn    . albuterol (PROVENTIL) (2.5 MG/3ML) 0.083% nebulizer solution Take 2.5 mg by nebulization every 6 (six) hours as needed for wheezing or shortness of breath.    . allopurinol (ZYLOPRIM) 300 MG tablet Take 300 mg by mouth daily.    [name] apixaban (ELIQUIS) 5 MG TABS tablet Take 5 mg by mouth 2 (two) times daily.    . beclomethasone (QVAR REDIHALER) 80 MCG/ACT inhaler Inhale 2 puffs into the lungs 2 (two) times daily.    [name] escitalopram (LEXAPRO) 20 MG tablet Take 20 mg by mouth daily.     . methocarbamol (ROBAXIN) 750 MG tablet Take 750 mg by mouth 3 (three) times daily.    . Multiple Vitamin (MULTIVITAMIN WITH MINERALS) TABS tablet Take 1 tablet by mouth daily.    . simvastatin (ZOCOR) 10 MG tablet Take 10 mg by mouth daily.  0  . tadalafil (CIALIS) 20 MG tablet Take 20 mg by mouth daily as needed for erectile dysfunction.    [name] zolpidem (AMBIEN) 10 MG tablet Take 10 mg by mouth at bedtime as needed for sleep.     No facility-administered medications prior to visit.         Objective:   Physical  Exam Vitals:   01/10/18 0853 01/10/18 0854  BP:  122/82  Pulse:  (!) 106  SpO2:  96%  Weight: 246 lb (111.6 kg)   Height: 6' (1.829 m)    Gen: Pleasant, obese man, in no distress,  normal affect  ENT: No lesions,  mouth clear,  oropharynx clear, no postnasal drip  Neck: No JVD, no stridor  Lungs: No use of accessory muscles, no crackles or wheezes  Cardiovascular: RRR, heart sounds normal, no murmur or gallops, no peripheral edema  Musculoskeletal: No deformities, no cyanosis or clubbing  Neuro: alert, non focal  Skin: Warm, no lesions  or rashes     Assessment & Plan:  Dyspnea His methacholine challenge does not show any evidence for asthma.  He may have some very mild COPD based on the curve of his flow volume loop.  We will stop his Qvar, continue Combivent as needed.  He may have some pulmonary hypertension given his probable sleep apnea, chronic thromboembolic disease.  He needs a repeat echocardiogram to compare with 2015.  I also encouraged him to work on diet and exercise.  He will get his home sleep study soon with Dr. Maxwell Caul.  Baltazar Apo, MD, PhD 01/10/2018, 9:25 AM [facility] [phone] or if no answer [phone]

## 2018-01-10 NOTE — Patient Instructions (Addendum)
We will perform an echocardiogram Get your home sleep study as planned. If you have sleep apnea then agree with plans to treat this, probably with CPAP. Stop QVAR Keep combivent available to use if needed for shortness of breath.  Agree with working on exercise and weight loss.   Follow with Dr Lamonte Sakai in 2 months or sooner if you have any problems.

## 2018-01-10 NOTE — Assessment & Plan Note (Signed)
His methacholine challenge does not show any evidence for asthma.  He may have some very mild COPD based on the curve of his flow volume loop.  We will stop his Qvar, continue Combivent as needed.  He may have some pulmonary hypertension given his probable sleep apnea, chronic thromboembolic disease.  He needs a repeat echocardiogram to compare with 2015.  I also encouraged him to work on diet and exercise.  He will get his home sleep study soon with Dr. Maxwell Caul.

## 2018-01-16 ENCOUNTER — Other Ambulatory Visit: Payer: Self-pay

## 2018-01-16 ENCOUNTER — Ambulatory Visit (HOSPITAL_COMMUNITY): Payer: Medicare Other | Attending: Cardiovascular Disease

## 2018-01-16 DIAGNOSIS — R0600 Dyspnea, unspecified: Secondary | ICD-10-CM | POA: Insufficient documentation

## 2018-01-16 MED ORDER — PERFLUTREN LIPID MICROSPHERE
1.0000 mL | INTRAVENOUS | Status: AC | PRN
  Administered 2018-01-16: 3 mL via INTRAVENOUS

## 2018-02-04 DIAGNOSIS — G4733 Obstructive sleep apnea (adult) (pediatric): Secondary | ICD-10-CM | POA: Diagnosis not present

## 2018-02-10 DIAGNOSIS — M25811 Other specified joint disorders, right shoulder: Secondary | ICD-10-CM | POA: Diagnosis not present

## 2018-02-10 DIAGNOSIS — M47812 Spondylosis without myelopathy or radiculopathy, cervical region: Secondary | ICD-10-CM | POA: Diagnosis not present

## 2018-02-12 DIAGNOSIS — R269 Unspecified abnormalities of gait and mobility: Secondary | ICD-10-CM | POA: Diagnosis not present

## 2018-02-12 DIAGNOSIS — G4733 Obstructive sleep apnea (adult) (pediatric): Secondary | ICD-10-CM | POA: Diagnosis not present

## 2018-02-18 DIAGNOSIS — M5412 Radiculopathy, cervical region: Secondary | ICD-10-CM | POA: Diagnosis not present

## 2018-02-20 ENCOUNTER — Telehealth: Payer: Self-pay

## 2018-02-20 DIAGNOSIS — G894 Chronic pain syndrome: Secondary | ICD-10-CM | POA: Diagnosis not present

## 2018-02-20 DIAGNOSIS — G4733 Obstructive sleep apnea (adult) (pediatric): Secondary | ICD-10-CM | POA: Diagnosis not present

## 2018-02-20 DIAGNOSIS — I129 Hypertensive chronic kidney disease with stage 1 through stage 4 chronic kidney disease, or unspecified chronic kidney disease: Secondary | ICD-10-CM | POA: Diagnosis not present

## 2018-02-20 DIAGNOSIS — N183 Chronic kidney disease, stage 3 (moderate): Secondary | ICD-10-CM | POA: Diagnosis not present

## 2018-02-20 NOTE — Telephone Encounter (Signed)
Sent referral to scheduling, attached notes to April file.  

## 2018-02-21 ENCOUNTER — Telehealth: Payer: Self-pay

## 2018-02-21 NOTE — Telephone Encounter (Signed)
FAXED NOTES TO [facility] 

## 2018-03-11 DIAGNOSIS — M4802 Spinal stenosis, cervical region: Secondary | ICD-10-CM | POA: Diagnosis not present

## 2018-03-13 DIAGNOSIS — N183 Chronic kidney disease, stage 3 (moderate): Secondary | ICD-10-CM | POA: Diagnosis not present

## 2018-03-14 DIAGNOSIS — R269 Unspecified abnormalities of gait and mobility: Secondary | ICD-10-CM | POA: Diagnosis not present

## 2018-03-14 DIAGNOSIS — G4733 Obstructive sleep apnea (adult) (pediatric): Secondary | ICD-10-CM | POA: Diagnosis not present

## 2018-03-17 ENCOUNTER — Encounter: Payer: Self-pay | Admitting: Emergency Medicine

## 2018-03-17 ENCOUNTER — Ambulatory Visit: Payer: Medicare Other | Admitting: Emergency Medicine

## 2018-03-17 DIAGNOSIS — J301 Allergic rhinitis due to pollen: Secondary | ICD-10-CM

## 2018-03-17 DIAGNOSIS — G4733 Obstructive sleep apnea (adult) (pediatric): Secondary | ICD-10-CM

## 2018-03-17 DIAGNOSIS — J449 Chronic obstructive pulmonary disease, unspecified: Secondary | ICD-10-CM

## 2018-03-17 DIAGNOSIS — J309 Allergic rhinitis, unspecified: Secondary | ICD-10-CM | POA: Insufficient documentation

## 2018-03-17 NOTE — Patient Instructions (Signed)
Please continue your CPAP as you have been using it.  Discussed with Dr. Maxwell Caul after July follow-up to make any adjustments. Please continue to keep Combivent available to use 2 puffs every 6 hours if needed for shortness of breath, wheezing, chest tightness. We will not restart Qvar at this time. Continue use Benadryl as needed for your nasal congestion and allergy symptoms. You may want to add chlorpheniramine 4 mg up to every 6 hours if needed for congestion and allergy symptoms as well. Follow with Dr Lamonte Sakai in 12 months or sooner if you have any problems

## 2018-03-17 NOTE — Assessment & Plan Note (Signed)
He did not have significant obstruction on pulmonary function testing.  He is keeping Combivent available to use as needed, needs it very rarely.  We will continue to have this available.  He is now off Qvar

## 2018-03-17 NOTE — Assessment & Plan Note (Signed)
More active over the last several days.  He is been using Benadryl as needed.  I recommended that he continue this and suggested that he might also consider chlorpheniramine as needed.

## 2018-03-17 NOTE — Progress Notes (Signed)
Subjective:    Patient ID: [name], male    DOB: 1941-04-02, 77 y.o.   MRN: [id number]  Shortness of Breath  Associated symptoms include chest pain. Pertinent negatives include no ear pain, fever, headaches, leg swelling, rash, rhinorrhea, sore throat, vomiting or wheezing.   77 year old former smoker (25 pack years) with a history of esophageal stricture, DVT and recurrent pulmonary embolism first in 1981, esophageal reflux, CVA, cervical stenosis, single kidney, chronic pain.  He carries a diagnosis of asthma although pulmonary function testing 06/08/14 did not show any overt obstruction or bronchodilator response or hyperinflation.   He underwent methacholine challenge 12/16/06 that I have reviewed. Shows a response to methacholine with a PD (-20) of approximately 11.8  He is referred today with shortness of breath and heaviness in chest, notices worse when walking a distance like shopping. Difficulty climbing stairs. Becomes tachypneic. Uses combivent at times like with long walk. Also on Qvar bid. He doesn' t believe that he has needed pred for breathing since 2014  CT-PA 09/18/17 reviewed by me > no recurrent PE, no change stable pulmonary nodule.   ROV 01/10/18 --he follows up today for further evaluation of his dyspnea.  He has a history of recurrent PE on Eliquis, GERD, tobacco use.  Also suspected obstructive sleep apnea, not yet established.  He underwent a methacholine challenge on 12/23/17 that I have reviewed.  This does not show any methacholine hyperresponsiveness, nor does it show any obstructive lung disease.   ROV 03/17/18 --77 year old gentleman who has a history of pulmonary embolism (on Eliquis), GERD, history of tobacco without any documented obstructive lung disease.  Since last visit we performed a home sleep study that showed evidence for obstructive sleep apnea and Dr Isidoro Donning started him on CPAP. He is unsure whether it has been beneficial. Using a full face mask,  has a lot of leak. Has follow up with Dr Isidoro Donning in early July. Recently he has had sinus pressure and congestion. Clear nasal drainage. Uses combivent prn > very rarely. Breathing has been stable. He is off QVAR.    Review of Systems  Constitutional: Positive for unexpected weight change. Negative for fever.  HENT: Negative for congestion, dental problem, ear pain, nosebleeds, postnasal drip, rhinorrhea, sinus pressure, sneezing, sore throat and trouble swallowing.   Eyes: Negative for redness and itching.  Respiratory: Positive for cough and shortness of breath. Negative for chest tightness and wheezing.   Cardiovascular: Positive for chest pain. Negative for palpitations and leg swelling.  Gastrointestinal: Negative for nausea and vomiting.  Genitourinary: Negative for dysuria.  Musculoskeletal: Negative for joint swelling.  Skin: Negative for rash.  Neurological: Negative for headaches.  Hematological: Does not bruise/bleed easily.  Psychiatric/Behavioral: Positive for dysphoric mood. The patient is not nervous/anxious.    Past Medical History:  Diagnosis Date  . Anticoagulated on warfarin   . Anxiety   . Arthritis    KNEES, NECK, SHOULDERS, HANDS  . Asthma   . Biceps tendon tear    right  . Cancer ([place])    skin cancer-  2 basal cell, 1 squamous cell  . GERD (gastroesophageal reflux disease)   . Gout, arthritis    BILATERAL GREAT TOE-- PER PT STABLE  . H/O transfusion of packed red blood cells    as a child/ s/p tonsillectomy  . History of pulmonary embolism   . Lung nodule    BENIGN RIGHT UPPER--  CHEST CT 08-22-2011  . OSA (obstructive sleep apnea)  NO CPAP -states dr said possibly related to asthma  . S/P dilatation of esophageal stricture   . Stroke ([place])   . Torn rotator cuff    Right x 3     Family History  Problem Relation Age of Onset  . Deep vein thrombosis Father   . Stroke Father   . Stroke Brother   . Colon cancer Mother   . Migraines Neg Hx       Social History   Socioeconomic History  . Marital status: Married    Spouse name: Not on file  . Number of children: 4  . Years of education: some master's classes  . Highest education level: Bachelor's degree (e.g., BA, AB, BS)  Occupational History  . Occupation: CNA  Social Needs  . Financial resource strain: Not on file  . Food insecurity:    Worry: Not on file    Inability: Not on file  . Transportation needs:    Medical: Not on file    Non-medical: Not on file  Tobacco Use  . Smoking status: Former Smoker    Packs/day: 1.00    Years: 25.00    Pack years: 25.00    Types: Cigarettes    Last attempt to quit: 07/13/1980    Years since quitting: 37.7  . Smokeless tobacco: Never Used  Substance and Sexual Activity  . Alcohol use: Yes    Comment: RARE  . Drug use: No  . Sexual activity: Yes    Partners: Female  Lifestyle  . Physical activity:    Days per week: Not on file    Minutes per session: Not on file  . Stress: Not on file  Relationships  . Social connections:    Talks on phone: Not on file    Gets together: Not on file    Attends religious service: Not on file    Active member of club or organization: Not on file    Attends meetings of clubs or organizations: Not on file    Relationship status: Not on file  . Intimate partner violence:    Fear of current or ex partner: Not on file    Emotionally abused: Not on file    Physically abused: Not on file    Forced sexual activity: Not on file  Other Topics Concern  . Not on file  Social History Narrative   Lives at home with his wife & youngest daughter and her family   Right handed   Drinks 2 cups of coffee daily, occasional cup of tea     Allergies  Allergen Reactions  . Garlic Anaphylaxis  . Onion Anaphylaxis  . Codeine Other (See Comments)    "PASSES OUT"  . Hydrocodone     Pt states that he is allergic to synthetic hydrocodone.  . Novocain [Procaine] Nausea Only     Outpatient Medications  Prior to Visit  Medication Sig Dispense Refill  . acetaminophen (TYLENOL) 500 MG tablet Take 1,000 mg by mouth 2 (two) times daily. And prn    . albuterol (PROVENTIL) (2.5 MG/3ML) 0.083% nebulizer solution Take 2.5 mg by nebulization every 6 (six) hours as needed for wheezing or shortness of breath.    . allopurinol (ZYLOPRIM) 300 MG tablet Take 300 mg by mouth daily.    [name] apixaban (ELIQUIS) 5 MG TABS tablet Take 5 mg by mouth 2 (two) times daily.    [name] escitalopram (LEXAPRO) 20 MG tablet Take 20 mg by mouth daily.     [name]  methocarbamol (ROBAXIN) 750 MG tablet Take 750 mg by mouth 3 (three) times daily.    . Multiple Vitamin (MULTIVITAMIN WITH MINERALS) TABS tablet Take 1 tablet by mouth daily.    . tadalafil (CIALIS) 20 MG tablet Take 20 mg by mouth daily as needed for erectile dysfunction.    [name] zolpidem (AMBIEN) 10 MG tablet Take 10 mg by mouth at bedtime as needed for sleep.    . beclomethasone (QVAR REDIHALER) 80 MCG/ACT inhaler Inhale 2 puffs into the lungs 2 (two) times daily.    . simvastatin (ZOCOR) 10 MG tablet Take 10 mg by mouth daily.  0   No facility-administered medications prior to visit.         Objective:   Physical Exam Vitals:   03/17/18 0912  BP: 140/80  Pulse: 91  SpO2: 97%  Weight: 245 lb (111.1 kg)  Height: 5' 11.5" (1.816 m)   Gen: Pleasant, obese man, in no distress,  normal affect  ENT: No lesions,  mouth clear,  oropharynx clear, no postnasal drip  Neck: No JVD, no stridor  Lungs: No use of accessory muscles, no crackles or wheezes  Cardiovascular: RRR, heart sounds normal, no murmur or gallops, no peripheral edema  Musculoskeletal: No deformities, no cyanosis or clubbing  Neuro: alert, non focal  Skin: Warm, no lesions or rashes     Assessment & Plan:  OSA (obstructive sleep apnea) Newly started on CPAP.  He is using a full facemask.  He has a little bit of leak and is not sure if is helping him clinically yet but he has not had a real  follow-up visit yet.  He will follow with Dr. Elenore Rota in July.  COPD (chronic obstructive pulmonary disease) ([place]) He did not have significant obstruction on pulmonary function testing.  He is keeping Combivent available to use as needed, needs it very rarely.  We will continue to have this available.  He is now off Qvar  Allergic rhinitis More active over the last several days.  He is been using Benadryl as needed.  I recommended that he continue this and suggested that he might also consider chlorpheniramine as needed.  Baltazar Apo, MD, PhD 03/17/2018, 9:35 AM [facility] [phone] or if no answer [phone]

## 2018-03-17 NOTE — Assessment & Plan Note (Signed)
Newly started on CPAP.  He is using a full facemask.  He has a little bit of leak and is not sure if is helping him clinically yet but he has not had a real follow-up visit yet.  He will follow with Dr. Elenore Rota in July.

## 2018-03-22 DIAGNOSIS — R000 Tachycardia, unspecified: Secondary | ICD-10-CM | POA: Diagnosis not present

## 2018-03-22 DIAGNOSIS — J0100 Acute maxillary sinusitis, unspecified: Secondary | ICD-10-CM | POA: Diagnosis not present

## 2018-03-25 DIAGNOSIS — M5412 Radiculopathy, cervical region: Secondary | ICD-10-CM | POA: Diagnosis not present

## 2018-04-07 ENCOUNTER — Encounter: Payer: Self-pay | Admitting: Cardiology

## 2018-04-07 ENCOUNTER — Ambulatory Visit: Payer: Medicare Other | Admitting: Cardiology

## 2018-04-07 VITALS — BP 152/72 | HR 89 | Ht 71.5 in | Wt 243.4 lb

## 2018-04-07 DIAGNOSIS — R0609 Other forms of dyspnea: Secondary | ICD-10-CM

## 2018-04-07 DIAGNOSIS — I208 Other forms of angina pectoris: Secondary | ICD-10-CM | POA: Diagnosis not present

## 2018-04-07 DIAGNOSIS — I2089 Other forms of angina pectoris: Secondary | ICD-10-CM | POA: Insufficient documentation

## 2018-04-07 DIAGNOSIS — I10 Essential (primary) hypertension: Secondary | ICD-10-CM

## 2018-04-07 DIAGNOSIS — R0600 Dyspnea, unspecified: Secondary | ICD-10-CM

## 2018-04-07 MED ORDER — METOPROLOL TARTRATE 50 MG PO TABS: 50.0000 mg | ORAL_TABLET | Freq: Once | ORAL | 0 refills | Status: DC

## 2018-04-07 NOTE — Patient Instructions (Addendum)
Medication instructions  no changes except  Use metoprolol day of coronary CTA    Your physician recommends that you schedule a follow-up appointment in 2 [place].     INSTRUCTIONS FOR  CORONARY CTA    Please arrive at the [facility] main entrance of [facility] at (30-45 minutes prior to test start time)  [facility] [place], [place] [address] [phone]  Proceed to the [facility] Radiology Department (First Floor).  Please follow these instructions carefully (unless otherwise directed):   PLEASE HAVE LABS - BMP  AT LEAST ONE WEEK PRIOR TO TEST  On the Night Before the Test: . Drink plenty of water. . Do not consume any caffeinated/decaffeinated beverages or chocolate 12 hours prior to your test. . Do not take any antihistamines 12 hours prior to your test.    On the Day of the Test: . Drink plenty of water. Do not drink any water within one hour of the test. . Do not eat any food 4 hours prior to the test. . You may take your regular medications prior to the test. .  Take 50 mg of lopressor (metoprolol) one hour before the test.   After the Test: . Drink plenty of water. . After receiving IV contrast, you may experience a mild flushed feeling. This is normal. . On occasion, you may experience a mild rash up to 24 hours after the test. This is not dangerous. If this occurs, you can take Benadryl 25 mg and increase your fluid intake. . If you experience trouble breathing, this can be serious. If it is severe call 911 IMMEDIATELY. If it is mild, please call our office.

## 2018-04-07 NOTE — Progress Notes (Signed)
PCP: Harlan Stains, MD  Clinic Note: Chief Complaint  Patient presents with  . New Patient (Initial Visit)    shortness of breath    HPI: [name] is a 77 y.o. male with history of known OSA and COPD (as well as C-spine stenosis) who is being seen today for the evaluation of "dyspnea on exertion" at the request of Harlan Stains, MD.  [name] was last seen on his PCP on April 18th --> apparently Pulm Med does not think he has Asthma; Just started on CPAP; Orthopedist stopped statin 2/2 muscle weakness.  Recent Hospitalizations: None  Studies Personally Reviewed - (if available, images/films reviewed: From Epic Chart or Care Everywhere)  2D Echo March 2019: Normal LV size with moderate LVH.  EF 60 to 65% with GR 1 DD (normal for age)  Prior ST in 2005 - Non-ischemic  Interval History: [name] presents today with a primary complaint of exertional shortness of breath - that has been exacerbated of late due to allergies.  What he notes is that if he does any "modest amount of exertion, he will ger short of breath.  He also notes SOB tying his shoes / bending over -- also will get a bit dizzy with this.   He denies any exertional chest pain or pressure.  No resting SOB or chest discomfort.   He does feel occasional "skipped beats", but denies any prolonged rapid/irregular heart rhythms.   No PND, orthopnea, or edema. No palpitations, lightheadedness, dizziness, weakness or syncope/near syncope. No TIA/amaurosis fugax symptoms. No melena, hematochezia, hematuria, or epstaxis. No claudication.  ROS: A comprehensive was performed. Review of Systems  Constitutional: Negative for malaise/fatigue.  HENT: Negative for congestion and nosebleeds.   Respiratory: Negative for cough, sputum production and wheezing.   Gastrointestinal: Negative for abdominal pain, blood in stool, heartburn and melena.  Genitourinary: Negative for hematuria.  Musculoskeletal: Positive for joint  pain. Negative for back pain and falls.  Neurological: Negative for focal weakness and weakness.  Psychiatric/Behavioral: Negative for depression and memory loss. The patient is not nervous/anxious and does not have insomnia.   All other systems reviewed and are negative.    I have reviewed and (if needed) personally updated the patient's problem list, medications, allergies, past medical and surgical history, social and family history.   Past Medical History:  Diagnosis Date  . Anticoagulated on warfarin   . Anxiety   . Arthritis    KNEES, NECK, SHOULDERS, HANDS  . Asthma   . Biceps tendon tear    right  . Cancer ([place])    skin cancer-  2 basal cell, 1 squamous cell  . GERD (gastroesophageal reflux disease)   . Gout, arthritis    BILATERAL GREAT TOE-- PER PT STABLE  . H/O transfusion of packed red blood cells    as a child/ s/p tonsillectomy  . History of pulmonary embolism   . Lung nodule    BENIGN RIGHT UPPER--  CHEST CT 08-22-2011  . OSA (obstructive sleep apnea)    NO CPAP -states dr said possibly related to asthma  . S/P dilatation of esophageal stricture   . Stroke ([place])   . Torn rotator cuff    Right x 3    Past Surgical History:  Procedure Laterality Date  . CARPAL TUNNEL RELEASE Right 2010  . JOINT REPLACEMENT    . KNEE ARTHROSCOPY Right   . KNEE ARTHROSCOPY Right   . SHOULDER ARTHROSCOPY Right   . TONSILLECTOMY  AGE 13  . TOTAL KNEE ARTHROPLASTY Left 10-15-2008  . TOTAL KNEE ARTHROPLASTY Right 01/18/2014   Procedure: RIGHT TOTAL KNEE ARTHROPLASTY;  Surgeon: Gearlean Alf, MD;  Location: [facility];  Service: Orthopedics;  Laterality: Right;  . TRANSTHORACIC ECHOCARDIOGRAM  01/2018    Normal LV size with moderate LVH.  EF 60 to 65% with GR 1 DD (normal for age)  . WRIST GANGLION EXCISION Right 1964    Current Meds  Medication Sig  . acetaminophen (TYLENOL) 500 MG tablet Take 1,000 mg by mouth 2 (two) times daily. And prn  . albuterol (PROVENTIL) (2.5  MG/3ML) 0.083% nebulizer solution Take 2.5 mg by nebulization every 6 (six) hours as needed for wheezing or shortness of breath.  . allopurinol (ZYLOPRIM) 300 MG tablet Take 300 mg by mouth daily.  [name] apixaban (ELIQUIS) 5 MG TABS tablet Take 5 mg by mouth 2 (two) times daily.  [name] escitalopram (LEXAPRO) 20 MG tablet Take 20 mg by mouth daily.   . methocarbamol (ROBAXIN) 750 MG tablet Take 750 mg by mouth 3 (three) times daily.  . Multiple Vitamin (MULTIVITAMIN WITH MINERALS) TABS tablet Take 1 tablet by mouth daily.  . tadalafil (CIALIS) 20 MG tablet Take 20 mg by mouth daily as needed for erectile dysfunction.  [name] zolpidem (AMBIEN) 10 MG tablet Take 10 mg by mouth at bedtime as needed for sleep.    Allergies  Allergen Reactions  . Garlic Anaphylaxis  . Onion Anaphylaxis  . Codeine Other (See Comments)    "PASSES OUT"  . Hydrocodone     Pt states that he is allergic to synthetic hydrocodone.  . Novocain [Procaine] Nausea Only    Social History   Tobacco Use  . Smoking status: Former Smoker    Packs/day: 1.00    Years: 25.00    Pack years: 25.00    Types: Cigarettes    Last attempt to quit: 07/13/1980    Years since quitting: 37.7  . Smokeless tobacco: Never Used  Substance Use Topics  . Alcohol use: Yes    Comment: RARE  . Drug use: No   Social History   Social History Narrative   Lives at home with his wife & youngest daughter and her family   Right handed   Drinks 2 cups of coffee daily, occasional cup of tea    family history includes Colon cancer in his mother; Deep vein thrombosis in his father; Stroke in his brother and father.  Wt Readings from Last 3 Encounters:  04/07/18 243 lb 6.4 oz (110.4 kg)  03/17/18 245 lb (111.1 kg)  01/10/18 246 lb (111.6 kg)    PHYSICAL EXAM BP (!) 152/72   Pulse 89   Ht 5' 11.5" (1.816 m)   Wt 243 lb 6.4 oz (110.4 kg)   BMI 33.47 kg/m  Physical Exam  Constitutional: He is oriented to person, place, and time. He appears  well-developed and well-nourished. No distress.  HENT:  Head: Normocephalic and atraumatic.  Eyes: Pupils are equal, round, and reactive to light. Conjunctivae and EOM are normal. No scleral icterus.  Neck: Normal range of motion. Neck supple. No hepatojugular reflux and no JVD present. Carotid bruit is not present.  Cardiovascular: Normal rate, regular rhythm, normal heart sounds, intact distal pulses and normal pulses.  Occasional extrasystoles are present. PMI is not displaced. Exam reveals no gallop and no friction rub.  No murmur heard. Pulmonary/Chest: Effort normal and breath sounds normal. No respiratory distress. He has no wheezes. He  has no rales.  Abdominal: Soft. Bowel sounds are normal. He exhibits no distension. There is no tenderness. There is no rebound.  Musculoskeletal: Normal range of motion. He exhibits no edema.  Neurological: He is alert and oriented to person, place, and time. No cranial nerve deficit.  Psychiatric: He has a normal mood and affect. His behavior is normal. Judgment and thought content normal.  Vitals reviewed.   Adult ECG Report  Rate: 89 ;  Rhythm: normal sinus rhythm, premature ventricular contractions (PVC) and normal axis, intervals & durations;   Narrative Interpretation: relatively normal EKG  Other studies Reviewed: Additional studies/ records that were reviewed today include:  Recent Labs:    05/2017: TC 248, TG 240, HDL 42, LDL 78; BUN/Cr 16/1.3    ASSESSMENT / PLAN: Problem List Items Addressed This Visit    Essential hypertension (Chronic)    Elevated BP today - Currently Not on any BP medications - will reassess in f/u to determine risk       Dyspnea    Pulmonary not sure of Asthma, but likely has some baseline COPD. + OSA & history of PE along with OHS.  Echo only notable for Mod LVH with Gr1 DD - not likely the culprit for DOE.  Evaluate for ischemic CAD with Cor CTA.      Relevant Orders   EKG 12-Lead (Completed)   Basic  metabolic panel   CT CORONARY MORPH W/CTA COR W/SCORE W/CA W/CM &/OR WO/CM   CT CORONARY FRACTIONAL FLOW RESERVE DATA PREP   CT CORONARY FRACTIONAL FLOW RESERVE FLUID ANALYSIS   Atypical angina (HCC) - Primary    Exertional dyspnea - with rare chest discomfort.  CRFs = HTN, HLD OSA, Male, Age  Evaluate for obstructive CAD with Coronary CTA.      Relevant Orders   EKG 12-Lead (Completed)   Basic metabolic panel   CT CORONARY MORPH W/CTA COR W/SCORE W/CA W/CM &/OR WO/CM   CT CORONARY FRACTIONAL FLOW RESERVE DATA PREP   CT CORONARY FRACTIONAL FLOW RESERVE FLUID ANALYSIS       I spent a total of 45 minutes with the patient and chart review. >  50% of the time was spent in direct patient consultation.   Current medicines are reviewed at length with the patient today.  (+/- concerns) n/a The following changes have been made:  n/a  Patient Instructions  Medication instructions  no changes except  Use metoprolol day of coronary CTA   Your physician recommends that you schedule a follow-up appointment in 2 [place].  Studies Ordered:   Orders Placed This Encounter  Procedures  . CT CORONARY MORPH W/CTA COR W/SCORE W/CA W/CM &/OR WO/CM  . CT CORONARY FRACTIONAL FLOW RESERVE DATA PREP  . CT CORONARY FRACTIONAL FLOW RESERVE FLUID ANALYSIS  . Basic metabolic panel  . EKG 12-Lead      Glenetta Hew, M.D., M.S. Interventional Cardiologist   Pager # [phone] Phone # [phone] [address] [place], [place] [address]   Thank you for choosing Heartcare at [facility]!!

## 2018-04-14 ENCOUNTER — Encounter: Payer: Self-pay | Admitting: Cardiology

## 2018-04-14 DIAGNOSIS — R269 Unspecified abnormalities of gait and mobility: Secondary | ICD-10-CM | POA: Diagnosis not present

## 2018-04-14 DIAGNOSIS — G4733 Obstructive sleep apnea (adult) (pediatric): Secondary | ICD-10-CM | POA: Diagnosis not present

## 2018-04-14 NOTE — Assessment & Plan Note (Signed)
Exertional dyspnea - with rare chest discomfort.  CRFs = HTN, HLD OSA, Male, Age  Evaluate for obstructive CAD with Coronary CTA.

## 2018-04-14 NOTE — Assessment & Plan Note (Signed)
Elevated BP today - Currently Not on any BP medications - will reassess in f/u to determine risk

## 2018-04-14 NOTE — Assessment & Plan Note (Signed)
Pulmonary not sure of Asthma, but likely has some baseline COPD. + OSA & history of PE along with OHS.  Echo only notable for Mod LVH with Gr1 DD - not likely the culprit for DOE.  Evaluate for ischemic CAD with Cor CTA.

## 2018-04-15 DIAGNOSIS — M4802 Spinal stenosis, cervical region: Secondary | ICD-10-CM | POA: Diagnosis not present

## 2018-05-05 DIAGNOSIS — E291 Testicular hypofunction: Secondary | ICD-10-CM | POA: Diagnosis not present

## 2018-05-05 DIAGNOSIS — M4802 Spinal stenosis, cervical region: Secondary | ICD-10-CM | POA: Diagnosis not present

## 2018-05-05 DIAGNOSIS — M5030 Other cervical disc degeneration, unspecified cervical region: Secondary | ICD-10-CM | POA: Diagnosis not present

## 2018-05-07 DIAGNOSIS — G4733 Obstructive sleep apnea (adult) (pediatric): Secondary | ICD-10-CM | POA: Diagnosis not present

## 2018-05-14 ENCOUNTER — Telehealth: Payer: Self-pay | Admitting: Cardiology

## 2018-05-14 DIAGNOSIS — I208 Other forms of angina pectoris: Secondary | ICD-10-CM | POA: Diagnosis not present

## 2018-05-14 DIAGNOSIS — R0609 Other forms of dyspnea: Secondary | ICD-10-CM | POA: Diagnosis not present

## 2018-05-14 LAB — BASIC METABOLIC PANEL
BUN/Creatinine Ratio: 10 (ref 10–24)
BUN: 14 mg/dL (ref 8–27)
CO2: 25 mmol/L (ref 20–29)
Calcium: 8.9 mg/dL (ref 8.6–10.2)
Chloride: 100 mmol/L (ref 96–106)
Creatinine, Ser: 1.39 mg/dL — ABNORMAL HIGH (ref 0.76–1.27)
GFR, EST AFRICAN AMERICAN: 57 mL/min/{1.73_m2} — AB (ref >59)
GFR, EST NON AFRICAN AMERICAN: 50 mL/min/{1.73_m2} — AB (ref >59)
Glucose: 73 mg/dL (ref 65–99)
POTASSIUM: 4.5 mmol/L (ref 3.5–5.2)
Sodium: 141 mmol/L (ref 134–144)

## 2018-05-14 NOTE — Telephone Encounter (Signed)
New Message:    Pt's authorization for his CT needs to be extended thru 05-29-18 please. At this time it is is only good until 05-20-18.

## 2018-05-14 NOTE — Telephone Encounter (Signed)
Message sent to pre-cert to address.

## 2018-05-14 NOTE — Telephone Encounter (Signed)
Spoke to [employer] in [place] and will address.

## 2018-05-19 DIAGNOSIS — N261 Atrophy of kidney (terminal): Secondary | ICD-10-CM | POA: Diagnosis not present

## 2018-05-19 DIAGNOSIS — D751 Secondary polycythemia: Secondary | ICD-10-CM | POA: Diagnosis not present

## 2018-05-19 DIAGNOSIS — E291 Testicular hypofunction: Secondary | ICD-10-CM | POA: Diagnosis not present

## 2018-05-29 ENCOUNTER — Ambulatory Visit (HOSPITAL_COMMUNITY)
Admission: RE | Admit: 2018-05-29 | Discharge: 2018-05-29 | Disposition: A | Discharge status: Home / Self Care | Payer: Medicare Other | Source: Ambulatory Visit | Attending: Cardiology | Admitting: Cardiology

## 2018-05-29 ENCOUNTER — Encounter (HOSPITAL_COMMUNITY): Payer: Self-pay

## 2018-05-29 DIAGNOSIS — I208 Other forms of angina pectoris: Secondary | ICD-10-CM

## 2018-05-29 DIAGNOSIS — R0609 Other forms of dyspnea: Secondary | ICD-10-CM | POA: Insufficient documentation

## 2018-05-29 DIAGNOSIS — R0600 Dyspnea, unspecified: Secondary | ICD-10-CM

## 2018-05-29 DIAGNOSIS — I2089 Other forms of angina pectoris: Secondary | ICD-10-CM

## 2018-05-29 DIAGNOSIS — I358 Other nonrheumatic aortic valve disorders: Secondary | ICD-10-CM | POA: Insufficient documentation

## 2018-05-29 DIAGNOSIS — R079 Chest pain, unspecified: Secondary | ICD-10-CM | POA: Diagnosis not present

## 2018-05-29 HISTORY — PX: OTHER SURGICAL HISTORY: SHX169

## 2018-05-29 MED ORDER — METOPROLOL TARTRATE 5 MG/5ML IV SOLN
INTRAVENOUS | Status: AC
  Administered 2018-05-29: 10 mg
  Administered 2018-05-29: 5 mg
  Filled 2018-05-29: qty 15

## 2018-05-29 MED ORDER — METOPROLOL TARTRATE 5 MG/5ML IV SOLN
5.0000 mg | INTRAVENOUS | Status: AC
  Administered 2018-05-29 (×3): 5 mg via INTRAVENOUS
  Filled 2018-05-29: qty 5

## 2018-05-29 MED ORDER — METOPROLOL TARTRATE 5 MG/5ML IV SOLN
INTRAVENOUS | Status: AC
  Administered 2018-05-29: 5 mg via INTRAVENOUS
  Filled 2018-05-29: qty 15

## 2018-05-29 MED ORDER — METOPROLOL TARTRATE 5 MG/5ML IV SOLN
5.0000 mg | INTRAVENOUS | Status: DC | PRN
  Filled 2018-05-29: qty 5

## 2018-05-29 MED ORDER — IOPAMIDOL (ISOVUE-370) INJECTION 76%
100.0000 mL | Freq: Once | INTRAVENOUS | Status: AC | PRN
  Administered 2018-05-29: 80 mL via INTRAVENOUS

## 2018-05-29 MED ORDER — NITROGLYCERIN 0.4 MG SL SUBL
SUBLINGUAL_TABLET | SUBLINGUAL | Status: AC
  Filled 2018-05-29: qty 2

## 2018-05-29 MED ORDER — NITROGLYCERIN 0.4 MG SL SUBL
0.8000 mg | SUBLINGUAL_TABLET | Freq: Once | SUBLINGUAL | Status: AC
  Administered 2018-05-29: 0.8 mg via SUBLINGUAL
  Filled 2018-05-29: qty 25

## 2018-05-29 MED ORDER — NITROGLYCERIN 0.4 MG SL SUBL
SUBLINGUAL_TABLET | SUBLINGUAL | Status: AC
  Administered 2018-05-29: 0.8 mg via SUBLINGUAL
  Filled 2018-05-29: qty 2

## 2018-05-29 MED ORDER — IOPAMIDOL (ISOVUE-370) INJECTION 76%
100.0000 mL | Freq: Once | INTRAVENOUS | Status: AC | PRN
  Administered 2018-05-29: 100 mL via INTRAVENOUS

## 2018-06-02 DIAGNOSIS — M5030 Other cervical disc degeneration, unspecified cervical region: Secondary | ICD-10-CM | POA: Diagnosis not present

## 2018-06-02 DIAGNOSIS — M4802 Spinal stenosis, cervical region: Secondary | ICD-10-CM | POA: Diagnosis not present

## 2018-06-05 ENCOUNTER — Encounter: Payer: Self-pay | Admitting: Cardiology

## 2018-06-05 ENCOUNTER — Ambulatory Visit: Payer: Medicare Other | Admitting: Cardiology

## 2018-06-05 VITALS — BP 129/69 | HR 82 | Ht 71.5 in | Wt 251.2 lb

## 2018-06-05 DIAGNOSIS — R0609 Other forms of dyspnea: Secondary | ICD-10-CM

## 2018-06-05 DIAGNOSIS — I10 Essential (primary) hypertension: Secondary | ICD-10-CM | POA: Diagnosis not present

## 2018-06-05 DIAGNOSIS — I208 Other forms of angina pectoris: Secondary | ICD-10-CM

## 2018-06-05 DIAGNOSIS — R0600 Dyspnea, unspecified: Secondary | ICD-10-CM

## 2018-06-05 DIAGNOSIS — R931 Abnormal findings on diagnostic imaging of heart and coronary circulation: Secondary | ICD-10-CM | POA: Diagnosis not present

## 2018-06-05 NOTE — Patient Instructions (Addendum)
    [place] [address] [place] [address] Dept: [phone] Loc: [place]  06/05/2018  You are scheduled for a Cardiac Catheterization on Tuesday, August 6 with Dr. Larae Grooms.  1. Please arrive at the [facility] (Main Entrance A) at [facility]: [address] [place],  [address] at 6:00 AM (YOUR PROCEDURE IS AT 10:30 AM Free valet parking service is available.   Special note: Every effort is made to have your procedure done on time. Please understand that emergencies sometimes delay scheduled procedures.  2. Diet: Do not eat solid foods after midnight.  The patient may have clear liquids until 5am upon the day of the procedure.  Your physician recommends that you HAVE LAB WORK TODAY  TAKE LAST DOSE OF ELIQUIS Sunday 06-08-18  On the morning of your procedure, take any morning medicines NOT listed above.  You may use sips of water.  5. Plan for one night stay--bring personal belongings. 6. Bring a current list of your medications and current insurance cards. 7. You MUST have a responsible person to drive you home. 8. Someone MUST be with you the first 24 hours after you arrive home or your discharge will be delayed. 9. Please wear clothes that are easy to get on and off and wear slip-on shoes.  POST-CATH follow-up with APP.  Thank you for allowing [place] to care for you!   -- [place] [facility]

## 2018-06-05 NOTE — Progress Notes (Signed)
PCP: Harlan Stains, MD  Clinic Note: Chief Complaint  Patient presents with  . Follow-up    2 months; after Cardiac CTA  . Shortness of Breath    exertional    HPI: [name] is a 77 y.o. male with history of known OSA and COPD (as well as C-spine stenosis) who is being seen today for FOLLOW-UP evaluation of "dyspnea on exertion" at the request of Harlan Stains, MD.  [name] was last initially seen on his on June 3rd --> he noted exertional dyspnea exacerbated due to allergies.  Short of breath with "modest amount of exertion ".  Also noted shortness of breath bending over to tie shoes.  No exertional chest pain or pressure.  Occasional skipped beats.  Recent Hospitalizations: None  Studies Personally Reviewed - (if available, images/films reviewed: From Epic Chart or Care Everywhere)  CORONARY CALCIUM SCORING WITH CT CORONARY ANGIOGRAM May 29, 2018: Coronary calcium score 416.  Possible obstructive CAD in the mid circumflex-OM1-OM 2, D2 and a moderate disease in the proximal LAD -->  FFR --> LAD: Proximal.90, mid.19 distal.68 D2.80, Circumflex: Mid.76 OM2.74: Hemodynamically significant distal LAD, D2, mid circumflex and OM2 lesions  Interval History: [name] presents today for follow-up he is accompanied today by his daughter and wife.  We were both concerned the fact that he is a little bit more vocal about his level of exertional dyspnea that he has been before.  Certainly the last several months have been worse for him.  His activity has been limited by a lot of right shoulder pain as far as doing any kind of manual activity.  He also recently stopped walking his sprain, mostly because his wife stopped walking but then as he tried to get back into walking he has been noticing that he cannot. He has not really noted any tightness in his chest with exertion, but just notes that with any modest by exertion he gets really short of breath that is associated with  lightheadedness and dizziness. If he keeps going and can get short of breath he says his chest may be a little bit tight but that is the only time the next visit. He still feels dizzy when bending over to tie his shoes gets a lot short of breath with that. He does not really describe any PND orthopnea, however he does not really lie down flat because of his neck injury/pain -this made a little bit difficult for him to do the CT scan.  Hard to tell if he has any orthopnea PND however because of his OSA using CPAP.  He notes that he still has some skipped beats and stop beats but this actually is to have a much more so a few years ago.  Not as much now.  Nothing associated with any lightheadedness or dizziness.  No syncope/near syncope or TIA/amaurosis fugax.  No claudication.  No melena, hematochezia, hematuria, epistaxis.  ** he has a solitary kidney   ROS: A comprehensive was performed. Review of Systems  Constitutional: Negative for malaise/fatigue and weight loss (WEIGHT GAIN!! due to less physical activity).  HENT: Negative for congestion.   Respiratory: Negative for cough, sputum production and wheezing.   Gastrointestinal: Negative for abdominal pain and heartburn.  Musculoskeletal: Positive for joint pain (mostly R shoulder - rotator cuff & joint capsule pain.) and neck pain. Negative for back pain and falls.  Neurological: Positive for dizziness (per HPI). Negative for focal weakness and weakness.  Psychiatric/Behavioral: Negative for  depression and memory loss. The patient is not nervous/anxious and does not have insomnia.   All other systems reviewed and are negative.   I have reviewed and (if needed) personally updated the patient's problem list, medications, allergies, past medical and surgical history, social and family history.   Past Medical History:  Diagnosis Date  . Anticoagulated on warfarin   . Anxiety   . Arthritis    KNEES, NECK, SHOULDERS, HANDS  . Asthma   .  Biceps tendon tear    right  . Cancer ([place])    skin cancer-  2 basal cell, 1 squamous cell  . GERD (gastroesophageal reflux disease)   . Gout, arthritis    BILATERAL GREAT TOE-- PER PT STABLE  . H/O transfusion of packed red blood cells    as a child/ s/p tonsillectomy  . History of pulmonary embolism   . Lung nodule    BENIGN RIGHT UPPER--  CHEST CT 08-22-2011  . OSA (obstructive sleep apnea)    NO CPAP -states dr said possibly related to asthma  . S/P dilatation of esophageal stricture   . Stroke ([place])   . Torn rotator cuff    Right x 3    Past Surgical History:  Procedure Laterality Date  . CARPAL TUNNEL RELEASE Right 2010  . CORONARY CALCIUM SCORING WITH CT CORONARY ANGIOGRAM  05/29/2018   Coronary calcium score 416.  Possible obstructive CAD in the mid circumflex-OM1-OM 2, D2 and a moderate disease in the proximal LAD -->  FFR --> LAD: Proximal.90, mid.86 distal.68 D2.80, Cx: Mid.76 OM2.74: Hemodynamically significant distal LAD, D2, mid circumflex and OM2 lesions  . JOINT REPLACEMENT    . KNEE ARTHROSCOPY Right   . KNEE ARTHROSCOPY Right   . NEPHRECTOMY    . SHOULDER ARTHROSCOPY Right   . TONSILLECTOMY  AGE 59  . TOTAL KNEE ARTHROPLASTY Left 10-15-2008  . TOTAL KNEE ARTHROPLASTY Right 01/18/2014   Procedure: RIGHT TOTAL KNEE ARTHROPLASTY;  Surgeon: Gearlean Alf, MD;  Location: [facility];  Service: Orthopedics;  Laterality: Right;  . TRANSTHORACIC ECHOCARDIOGRAM  01/2018    Normal LV size with moderate LVH.  EF 60 to 65% with GR 1 DD (normal for age)  . WRIST GANGLION EXCISION Right 1964    Current Meds  Medication Sig  . acetaminophen (TYLENOL) 500 MG tablet Take 1,000 mg by mouth 2 (two) times daily as needed for moderate pain. And prn  . albuterol (PROVENTIL) (2.5 MG/3ML) 0.083% nebulizer solution Take 2.5 mg by nebulization every 6 (six) hours as needed for wheezing or shortness of breath.  . allopurinol (ZYLOPRIM) 300 MG tablet Take 300 mg by mouth daily.  [name]  apixaban (ELIQUIS) 5 MG TABS tablet Take 5 mg by mouth 2 (two) times daily.  [name] escitalopram (LEXAPRO) 20 MG tablet Take 20 mg by mouth daily.   . methocarbamol (ROBAXIN) 750 MG tablet Take 750 mg by mouth 3 (three) times daily.  . Multiple Vitamin (MULTIVITAMIN WITH MINERALS) TABS tablet Take 1 tablet by mouth daily.  . tadalafil (CIALIS) 20 MG tablet Take 20 mg by mouth daily as needed for erectile dysfunction.  . [DISCONTINUED] gabapentin (NEURONTIN) 300 MG capsule Neurontin 300 mg capsule  Take 1 capsule 3 times a day by oral route.  . [DISCONTINUED] Misc Natural Products (NF FORMULAS TESTOSTERONE) CAPS Take 200 mg by mouth every 21 ( twenty-one) days.  . [DISCONTINUED] valsartan (DIOVAN) 320 MG tablet valsartan 320 mg tablet  Take 1 tablet every day by oral  route.  . [DISCONTINUED] zolpidem (AMBIEN) 10 MG tablet Take 10 mg by mouth at bedtime as needed for sleep.    Allergies  Allergen Reactions  . Garlic Anaphylaxis  . Onion Anaphylaxis  . Codeine Other (See Comments)    "PASSES OUT"  . Hydrocodone     Pt states that he is allergic to synthetic hydrocodone.  . Novocain [Procaine] Nausea Only    Social History   Tobacco Use  . Smoking status: Former Smoker    Packs/day: 1.00    Years: 25.00    Pack years: 25.00    Types: Cigarettes    Last attempt to quit: 07/13/1980    Years since quitting: 37.9  . Smokeless tobacco: Never Used  Substance Use Topics  . Alcohol use: Yes    Comment: RARE  . Drug use: No   Social History   Social History Narrative   Lives at home with his wife & youngest daughter and her family   Right handed   Drinks 2 cups of coffee daily, occasional cup of tea    family history includes Colon cancer in his mother; Deep vein thrombosis in his father; Stroke in his brother and father.  Wt Readings from Last 3 Encounters:  06/05/18 251 lb 3.2 oz (113.9 kg)  04/07/18 243 lb 6.4 oz (110.4 kg)  03/17/18 245 lb (111.1 kg)    PHYSICAL EXAM BP  129/69   Pulse 82   Ht 5' 11.5" (1.816 m)   Wt 251 lb 3.2 oz (113.9 kg)   BMI 34.55 kg/m  Physical Exam  Constitutional: He is oriented to person, place, and time. He appears well-developed and well-nourished. No distress.  HENT:  Head: Normocephalic and atraumatic.  Eyes: Pupils are equal, round, and reactive to light. Conjunctivae and EOM are normal. No scleral icterus.  Neck: Normal range of motion. Neck supple. No hepatojugular reflux and no JVD present. Carotid bruit is not present.  Cardiovascular: Normal rate, regular rhythm, normal heart sounds, intact distal pulses and normal pulses.  Occasional extrasystoles are present. PMI is not displaced. Exam reveals no gallop and no friction rub.  No murmur heard. Pulmonary/Chest: Effort normal and breath sounds normal. No respiratory distress. He has no wheezes. He has no rales.  Abdominal: Soft. Bowel sounds are normal. He exhibits no distension. There is no tenderness. There is no rebound.  Musculoskeletal: Normal range of motion. He exhibits no edema.  Neurological: He is alert and oriented to person, place, and time. No cranial nerve deficit.  Skin: Skin is warm and dry.  Mild bilateral lower extremity/ankle spider/varicose veins.  No signs of venous stasis dermatitis.  Psychiatric: He has a normal mood and affect. His behavior is normal. Judgment and thought content normal.  Vitals reviewed.   Adult ECG Report n/a  Other studies Reviewed: Additional studies/ records that were reviewed today include:  Recent Labs:    05/2017: TC 248, TG 240, HDL 42, LDL 78;  Lab Results  Component Value Date   CREATININE 1.39 (H) 05/14/2018   BUN 14 05/14/2018   NA 141 05/14/2018   K 4.5 05/14/2018   CL 100 05/14/2018   CO2 25 05/14/2018     ASSESSMENT / PLAN:  With DOE/Atypical Angina & Abnormal Cardiac CTA -- Plan CARDIAC CATHETERIZATION (early arrival for hydration given solitary kidney) with Dr. Lendell Caprice August 6. May need to  consider staged PCI He will hold Eliquis 24 hours preprocedure   Problem List Items Addressed This Visit  Abnormal cardiac CT angiography - Primary    With DOE/Atypical Angina & Abnormal Cardiac CTA -- Plan CARDIAC CATHETERIZATION (early arrival for hydration given solitary kidney) with Dr. Lendell Caprice August 6. May need to consider staged PCI He will hold Eliquis 24 hours preprocedure  Will need to check & follow Lipid panel & add Beta Blocker post Cath - if PCI.      Relevant Orders   Basic metabolic panel   CBC   Atypical angina (HCC) -Class II-III    Mostly exertional dyspnea as an anginal component.  He does not really have chest discomfort if he pushes beyond the dyspnea.  Cardiac risk factors include hypertension, hyperlipidemia, OSA, male and age. Evidence of hemodynamically significant lesions in multiple vessels on coronary CT angiogram.  After long discussion with the patient and his family, they agree with the plan to proceed with cardiac catheterization.  I will schedule this with my my partners as I will be on vacation.  He will be By Dr. Irish Lack on Tuesday, August 6--> he to allow for roughly 4 hours of IV hydration come in early to protect his solitary kidney.  Performing MD: Lendell Caprice, M.D.  Procedure: LEFT With NATIVE CORONARY ANGIOGRAPHY and possible PERCUTANEOUS CORONARY INTERVENTION  The procedure with Risks/Benefits/Alternatives and Indications was reviewed with the patient & wife + daughter.  All questions were answered.    Risks / Complications include, but not limited to: Death, MI, CVA/TIA, VF/VT (with defibrillation), Bradycardia (need for temporary pacer placement), contrast induced nephropathy -- increased disk with solitary kidney!!, bleeding / bruising / hematoma / pseudoaneurysm, vascular or coronary injury (with possible emergent CT or Vascular Surgery), adverse medication reactions, infection.  Additional risks involving the use of radiation with the  possibility of radiation burns and cancer were explained in detail.  The patient (and family) voice understanding and agree to proceed.         Dyspnea (Chronic)    Exertional dyspnea associated with abnormal cardiac CTA.    Plan for now is to proceed with cardiac catheterization.      Essential hypertension (Chronic)    Blood pressure stable.  He is already on an ARB.  Pending results prior catheterization will plan to start to start beta-blocker.          I spent a total of 45-50 minutes with the patient and chart review. >  50% of the time was spent in direct patient consultation.  Explanation as result cardiac CTA results/discussion about cardiac catheterization plus/PCI.    Current medicines are reviewed at length with the patient today.  (+/- concerns) n/a The following changes have been made:  n/a  Patient Instructions   You are scheduled for a Cardiac Catheterization on Tuesday, August 6 with Dr. Larae Grooms.  Your physician recommends that you HAVE LAB WORK TODAY  TAKE LAST DOSE OF ELIQUIS Sunday 06-08-18  On the morning of your procedure, take any morning medicines NOT listed above.  You may use sips of water.  Post-Cath f/u appt with APP    Studies Ordered:   Orders Placed This Encounter  Procedures  . Basic metabolic panel  . CBC      Glenetta Hew, M.D., M.S. Interventional Cardiologist   Pager # [phone] Phone # [phone] [address] [place], [place] [address]   Thank you for choosing [facility] at [facility]!!

## 2018-06-05 NOTE — Assessment & Plan Note (Signed)
Exertional dyspnea associated with abnormal cardiac CTA.    Plan for now is to proceed with cardiac catheterization.

## 2018-06-05 NOTE — H&P (View-Only) (Signed)
PCP: Harlan Stains, MD  Clinic Note: Chief Complaint  Patient presents with  . Follow-up    2 months; after Cardiac CTA  . Shortness of Breath    exertional    HPI: [name] is a 77 y.o. male with history of known OSA and COPD (as well as C-spine stenosis) who is being seen today for FOLLOW-UP evaluation of "dyspnea on exertion" at the request of Harlan Stains, MD.  [name] was last initially seen on his on June 3rd --> he noted exertional dyspnea exacerbated due to allergies.  Short of breath with "modest amount of exertion ".  Also noted shortness of breath bending over to tie shoes.  No exertional chest pain or pressure.  Occasional skipped beats.  Recent Hospitalizations: None  Studies Personally Reviewed - (if available, images/films reviewed: From Epic Chart or Care Everywhere)  CORONARY CALCIUM SCORING WITH CT CORONARY ANGIOGRAM May 29, 2018: Coronary calcium score 416.  Possible obstructive CAD in the mid circumflex-OM1-OM 2, D2 and a moderate disease in the proximal LAD -->  FFR --> LAD: Proximal.90, mid.62 distal.68 D2.80, Circumflex: Mid.76 OM2.74: Hemodynamically significant distal LAD, D2, mid circumflex and OM2 lesions  Interval History: [name] presents today for follow-up he is accompanied today by his daughter and wife.  We were both concerned the fact that he is a little bit more vocal about his level of exertional dyspnea that he has been before.  Certainly the last several months have been worse for him.  His activity has been limited by a lot of right shoulder pain as far as doing any kind of manual activity.  He also recently stopped walking his sprain, mostly because his wife stopped walking but then as he tried to get back into walking he has been noticing that he cannot. He has not really noted any tightness in his chest with exertion, but just notes that with any modest by exertion he gets really short of breath that is associated with  lightheadedness and dizziness. If he keeps going and can get short of breath he says his chest may be a little bit tight but that is the only time the next visit. He still feels dizzy when bending over to tie his shoes gets a lot short of breath with that. He does not really describe any PND orthopnea, however he does not really lie down flat because of his neck injury/pain -this made a little bit difficult for him to do the CT scan.  Hard to tell if he has any orthopnea PND however because of his OSA using CPAP.  He notes that he still has some skipped beats and stop beats but this actually is to have a much more so a few years ago.  Not as much now.  Nothing associated with any lightheadedness or dizziness.  No syncope/near syncope or TIA/amaurosis fugax.  No claudication.  No melena, hematochezia, hematuria, epistaxis.  ** he has a solitary kidney   ROS: A comprehensive was performed. Review of Systems  Constitutional: Negative for malaise/fatigue and weight loss (WEIGHT GAIN!! due to less physical activity).  HENT: Negative for congestion.   Respiratory: Negative for cough, sputum production and wheezing.   Gastrointestinal: Negative for abdominal pain and heartburn.  Musculoskeletal: Positive for joint pain (mostly R shoulder - rotator cuff & joint capsule pain.) and neck pain. Negative for back pain and falls.  Neurological: Positive for dizziness (per HPI). Negative for focal weakness and weakness.  Psychiatric/Behavioral: Negative for  depression and memory loss. The patient is not nervous/anxious and does not have insomnia.   All other systems reviewed and are negative.   I have reviewed and (if needed) personally updated the patient's problem list, medications, allergies, past medical and surgical history, social and family history.   Past Medical History:  Diagnosis Date  . Anticoagulated on warfarin   . Anxiety   . Arthritis    KNEES, NECK, SHOULDERS, HANDS  . Asthma   .  Biceps tendon tear    right  . Cancer ([place])    skin cancer-  2 basal cell, 1 squamous cell  . GERD (gastroesophageal reflux disease)   . Gout, arthritis    BILATERAL GREAT TOE-- PER PT STABLE  . H/O transfusion of packed red blood cells    as a child/ s/p tonsillectomy  . History of pulmonary embolism   . Lung nodule    BENIGN RIGHT UPPER--  CHEST CT 08-22-2011  . OSA (obstructive sleep apnea)    NO CPAP -states dr said possibly related to asthma  . S/P dilatation of esophageal stricture   . Stroke ([place])   . Torn rotator cuff    Right x 3    Past Surgical History:  Procedure Laterality Date  . CARPAL TUNNEL RELEASE Right 2010  . CORONARY CALCIUM SCORING WITH CT CORONARY ANGIOGRAM  05/29/2018   Coronary calcium score 416.  Possible obstructive CAD in the mid circumflex-OM1-OM 2, D2 and a moderate disease in the proximal LAD -->  FFR --> LAD: Proximal.90, mid.86 distal.68 D2.80, Cx: Mid.76 OM2.74: Hemodynamically significant distal LAD, D2, mid circumflex and OM2 lesions  . JOINT REPLACEMENT    . KNEE ARTHROSCOPY Right   . KNEE ARTHROSCOPY Right   . NEPHRECTOMY    . SHOULDER ARTHROSCOPY Right   . TONSILLECTOMY  AGE 6  . TOTAL KNEE ARTHROPLASTY Left 10-15-2008  . TOTAL KNEE ARTHROPLASTY Right 01/18/2014   Procedure: RIGHT TOTAL KNEE ARTHROPLASTY;  Surgeon: Gearlean Alf, MD;  Location: [facility];  Service: Orthopedics;  Laterality: Right;  . TRANSTHORACIC ECHOCARDIOGRAM  01/2018    Normal LV size with moderate LVH.  EF 60 to 65% with GR 1 DD (normal for age)  . WRIST GANGLION EXCISION Right 1964    Current Meds  Medication Sig  . acetaminophen (TYLENOL) 500 MG tablet Take 1,000 mg by mouth 2 (two) times daily as needed for moderate pain. And prn  . albuterol (PROVENTIL) (2.5 MG/3ML) 0.083% nebulizer solution Take 2.5 mg by nebulization every 6 (six) hours as needed for wheezing or shortness of breath.  . allopurinol (ZYLOPRIM) 300 MG tablet Take 300 mg by mouth daily.  [name]  apixaban (ELIQUIS) 5 MG TABS tablet Take 5 mg by mouth 2 (two) times daily.  [name] escitalopram (LEXAPRO) 20 MG tablet Take 20 mg by mouth daily.   . methocarbamol (ROBAXIN) 750 MG tablet Take 750 mg by mouth 3 (three) times daily.  . Multiple Vitamin (MULTIVITAMIN WITH MINERALS) TABS tablet Take 1 tablet by mouth daily.  . tadalafil (CIALIS) 20 MG tablet Take 20 mg by mouth daily as needed for erectile dysfunction.  . [DISCONTINUED] gabapentin (NEURONTIN) 300 MG capsule Neurontin 300 mg capsule  Take 1 capsule 3 times a day by oral route.  . [DISCONTINUED] Misc Natural Products (NF FORMULAS TESTOSTERONE) CAPS Take 200 mg by mouth every 21 ( twenty-one) days.  . [DISCONTINUED] valsartan (DIOVAN) 320 MG tablet valsartan 320 mg tablet  Take 1 tablet every day by oral  route.  . [DISCONTINUED] zolpidem (AMBIEN) 10 MG tablet Take 10 mg by mouth at bedtime as needed for sleep.    Allergies  Allergen Reactions  . Garlic Anaphylaxis  . Onion Anaphylaxis  . Codeine Other (See Comments)    "PASSES OUT"  . Hydrocodone     Pt states that he is allergic to synthetic hydrocodone.  . Novocain [Procaine] Nausea Only    Social History   Tobacco Use  . Smoking status: Former Smoker    Packs/day: 1.00    Years: 25.00    Pack years: 25.00    Types: Cigarettes    Last attempt to quit: 07/13/1980    Years since quitting: 37.9  . Smokeless tobacco: Never Used  Substance Use Topics  . Alcohol use: Yes    Comment: RARE  . Drug use: No   Social History   Social History Narrative   Lives at home with his wife & youngest daughter and her family   Right handed   Drinks 2 cups of coffee daily, occasional cup of tea    family history includes Colon cancer in his mother; Deep vein thrombosis in his father; Stroke in his brother and father.  Wt Readings from Last 3 Encounters:  06/05/18 251 lb 3.2 oz (113.9 kg)  04/07/18 243 lb 6.4 oz (110.4 kg)  03/17/18 245 lb (111.1 kg)    PHYSICAL EXAM BP  129/69   Pulse 82   Ht 5' 11.5" (1.816 m)   Wt 251 lb 3.2 oz (113.9 kg)   BMI 34.55 kg/m  Physical Exam  Constitutional: He is oriented to person, place, and time. He appears well-developed and well-nourished. No distress.  HENT:  Head: Normocephalic and atraumatic.  Eyes: Pupils are equal, round, and reactive to light. Conjunctivae and EOM are normal. No scleral icterus.  Neck: Normal range of motion. Neck supple. No hepatojugular reflux and no JVD present. Carotid bruit is not present.  Cardiovascular: Normal rate, regular rhythm, normal heart sounds, intact distal pulses and normal pulses.  Occasional extrasystoles are present. PMI is not displaced. Exam reveals no gallop and no friction rub.  No murmur heard. Pulmonary/Chest: Effort normal and breath sounds normal. No respiratory distress. He has no wheezes. He has no rales.  Abdominal: Soft. Bowel sounds are normal. He exhibits no distension. There is no tenderness. There is no rebound.  Musculoskeletal: Normal range of motion. He exhibits no edema.  Neurological: He is alert and oriented to person, place, and time. No cranial nerve deficit.  Skin: Skin is warm and dry.  Mild bilateral lower extremity/ankle spider/varicose veins.  No signs of venous stasis dermatitis.  Psychiatric: He has a normal mood and affect. His behavior is normal. Judgment and thought content normal.  Vitals reviewed.   Adult ECG Report n/a  Other studies Reviewed: Additional studies/ records that were reviewed today include:  Recent Labs:    05/2017: TC 248, TG 240, HDL 42, LDL 78;  Lab Results  Component Value Date   CREATININE 1.39 (H) 05/14/2018   BUN 14 05/14/2018   NA 141 05/14/2018   K 4.5 05/14/2018   CL 100 05/14/2018   CO2 25 05/14/2018     ASSESSMENT / PLAN:  With DOE/Atypical Angina & Abnormal Cardiac CTA -- Plan CARDIAC CATHETERIZATION (early arrival for hydration given solitary kidney) with Dr. Lendell Caprice August 6. May need to  consider staged PCI He will hold Eliquis 24 hours preprocedure   Problem List Items Addressed This Visit  Abnormal cardiac CT angiography - Primary    With DOE/Atypical Angina & Abnormal Cardiac CTA -- Plan CARDIAC CATHETERIZATION (early arrival for hydration given solitary kidney) with Dr. Lendell Caprice August 6. May need to consider staged PCI He will hold Eliquis 24 hours preprocedure  Will need to check & follow Lipid panel & add Beta Blocker post Cath - if PCI.      Relevant Orders   Basic metabolic panel   CBC   Atypical angina (HCC) -Class II-III    Mostly exertional dyspnea as an anginal component.  He does not really have chest discomfort if he pushes beyond the dyspnea.  Cardiac risk factors include hypertension, hyperlipidemia, OSA, male and age. Evidence of hemodynamically significant lesions in multiple vessels on coronary CT angiogram.  After long discussion with the patient and his family, they agree with the plan to proceed with cardiac catheterization.  I will schedule this with my my partners as I will be on vacation.  He will be By Dr. Irish Lack on Tuesday, August 6--> he to allow for roughly 4 hours of IV hydration come in early to protect his solitary kidney.  Performing MD: Lendell Caprice, M.D.  Procedure: LEFT With NATIVE CORONARY ANGIOGRAPHY and possible PERCUTANEOUS CORONARY INTERVENTION  The procedure with Risks/Benefits/Alternatives and Indications was reviewed with the patient & wife + daughter.  All questions were answered.    Risks / Complications include, but not limited to: Death, MI, CVA/TIA, VF/VT (with defibrillation), Bradycardia (need for temporary pacer placement), contrast induced nephropathy -- increased disk with solitary kidney!!, bleeding / bruising / hematoma / pseudoaneurysm, vascular or coronary injury (with possible emergent CT or Vascular Surgery), adverse medication reactions, infection.  Additional risks involving the use of radiation with the  possibility of radiation burns and cancer were explained in detail.  The patient (and family) voice understanding and agree to proceed.         Dyspnea (Chronic)    Exertional dyspnea associated with abnormal cardiac CTA.    Plan for now is to proceed with cardiac catheterization.      Essential hypertension (Chronic)    Blood pressure stable.  He is already on an ARB.  Pending results prior catheterization will plan to start to start beta-blocker.          I spent a total of 45-50 minutes with the patient and chart review. >  50% of the time was spent in direct patient consultation.  Explanation as result cardiac CTA results/discussion about cardiac catheterization plus/PCI.    Current medicines are reviewed at length with the patient today.  (+/- concerns) n/a The following changes have been made:  n/a  Patient Instructions   You are scheduled for a Cardiac Catheterization on Tuesday, August 6 with Dr. Larae Grooms.  Your physician recommends that you HAVE LAB WORK TODAY  TAKE LAST DOSE OF ELIQUIS Sunday 06-08-18  On the morning of your procedure, take any morning medicines NOT listed above.  You may use sips of water.  Post-Cath f/u appt with APP    Studies Ordered:   Orders Placed This Encounter  Procedures  . Basic metabolic panel  . CBC      Glenetta Hew, M.D., M.S. Interventional Cardiologist   Pager # [phone] Phone # [phone] [address] [place], [place] [address]   Thank you for choosing [facility] at [facility]!!

## 2018-06-05 NOTE — Assessment & Plan Note (Signed)
Mostly exertional dyspnea as an anginal component.  He does not really have chest discomfort if he pushes beyond the dyspnea.  Cardiac risk factors include hypertension, hyperlipidemia, OSA, male and age. Evidence of hemodynamically significant lesions in multiple vessels on coronary CT angiogram.  After long discussion with the patient and his family, they agree with the plan to proceed with cardiac catheterization.  I will schedule this with my my partners as I will be on vacation.  He will be By Dr. Irish Lack on Tuesday, August 6--> he to allow for roughly 4 hours of IV hydration come in early to protect his solitary kidney.  Performing MD: Lendell Caprice, M.D.  Procedure: LEFT With NATIVE CORONARY ANGIOGRAPHY and possible PERCUTANEOUS CORONARY INTERVENTION  The procedure with Risks/Benefits/Alternatives and Indications was reviewed with the patient & wife + daughter.  All questions were answered.    Risks / Complications include, but not limited to: Death, MI, CVA/TIA, VF/VT (with defibrillation), Bradycardia (need for temporary pacer placement), contrast induced nephropathy -- increased disk with solitary kidney!!, bleeding / bruising / hematoma / pseudoaneurysm, vascular or coronary injury (with possible emergent CT or Vascular Surgery), adverse medication reactions, infection.  Additional risks involving the use of radiation with the possibility of radiation burns and cancer were explained in detail.  The patient (and family) voice understanding and agree to proceed.

## 2018-06-05 NOTE — Assessment & Plan Note (Signed)
Blood pressure stable.  He is already on an ARB.  Pending results prior catheterization will plan to start to start beta-blocker.

## 2018-06-05 NOTE — Assessment & Plan Note (Addendum)
With DOE/Atypical Angina & Abnormal Cardiac CTA -- Plan CARDIAC CATHETERIZATION (early arrival for hydration given solitary kidney) with Dr. Lendell Caprice August 6. May need to consider staged PCI He will hold Eliquis 24 hours preprocedure  Will need to check & follow Lipid panel & add Beta Blocker post Cath - if PCI.

## 2018-06-06 LAB — CBC
HEMOGLOBIN: 17.5 g/dL (ref 13.0–17.7)
Hematocrit: 51.6 % — ABNORMAL HIGH (ref 37.5–51.0)
MCH: 31.9 pg (ref 26.6–33.0)
MCHC: 33.9 g/dL (ref 31.5–35.7)
MCV: 94 fL (ref 79–97)
Platelets: 182 10*3/uL (ref 150–450)
RBC: 5.48 x10E6/uL (ref 4.14–5.80)
RDW: 15.6 % — ABNORMAL HIGH (ref 12.3–15.4)
WBC: 6.9 10*3/uL (ref 3.4–10.8)

## 2018-06-06 LAB — BASIC METABOLIC PANEL
BUN/Creatinine Ratio: 14 (ref 10–24)
BUN: 19 mg/dL (ref 8–27)
CHLORIDE: 101 mmol/L (ref 96–106)
CO2: 27 mmol/L (ref 20–29)
Calcium: 9.5 mg/dL (ref 8.6–10.2)
Creatinine, Ser: 1.33 mg/dL — ABNORMAL HIGH (ref 0.76–1.27)
GFR calc non Af Amer: 52 mL/min/{1.73_m2} — ABNORMAL LOW (ref >59)
GFR, EST AFRICAN AMERICAN: 60 mL/min/{1.73_m2} (ref >59)
GLUCOSE: 77 mg/dL (ref 65–99)
POTASSIUM: 5.3 mmol/L — AB (ref 3.5–5.2)
Sodium: 142 mmol/L (ref 134–144)

## 2018-06-09 ENCOUNTER — Telehealth: Payer: Self-pay | Admitting: *Deleted

## 2018-06-09 NOTE — Telephone Encounter (Signed)
Pt contacted pre-catheterization scheduled at [facility] for: Tuesday June 10, 2018 10:30 AM Verified arrival time and place: [place] Entrance A at: 6 AM for pre-procedure hydration  No solid food after midnight prior to cath, clear liquids until 5 AM day of procedure. Verified allergies in Epic Verified no diabetes medications.  Hold: Apixaban-last dose 06/08/18 Cialis- 24 hours prior to procedure. Valsartan-AM of procedure.  Except hold medications AM meds can be  taken pre-cath with sip of water including: ASA 81 mg  Confirmed patient has responsible person to drive home post procedure and for 24 hours after you arrive home: yes

## 2018-06-10 ENCOUNTER — Encounter (HOSPITAL_COMMUNITY)
Admission: RE | Disposition: A | Discharge status: Home / Self Care | Payer: Self-pay | Source: Ambulatory Visit | Attending: Interventional Cardiology

## 2018-06-10 ENCOUNTER — Ambulatory Visit (HOSPITAL_COMMUNITY)
Admission: RE | Admit: 2018-06-10 | Discharge: 2018-06-10 | Disposition: A | Discharge status: Home / Self Care | Payer: Medicare Other | Source: Ambulatory Visit | Attending: Interventional Cardiology | Admitting: Interventional Cardiology

## 2018-06-10 ENCOUNTER — Other Ambulatory Visit: Payer: Self-pay

## 2018-06-10 DIAGNOSIS — G4733 Obstructive sleep apnea (adult) (pediatric): Secondary | ICD-10-CM | POA: Insufficient documentation

## 2018-06-10 DIAGNOSIS — Z86711 Personal history of pulmonary embolism: Secondary | ICD-10-CM | POA: Diagnosis not present

## 2018-06-10 DIAGNOSIS — K219 Gastro-esophageal reflux disease without esophagitis: Secondary | ICD-10-CM | POA: Diagnosis not present

## 2018-06-10 DIAGNOSIS — R931 Abnormal findings on diagnostic imaging of heart and coronary circulation: Secondary | ICD-10-CM | POA: Diagnosis not present

## 2018-06-10 DIAGNOSIS — Z885 Allergy status to narcotic agent status: Secondary | ICD-10-CM | POA: Diagnosis not present

## 2018-06-10 DIAGNOSIS — Z7901 Long term (current) use of anticoagulants: Secondary | ICD-10-CM | POA: Insufficient documentation

## 2018-06-10 DIAGNOSIS — Z832 Family history of diseases of the blood and blood-forming organs and certain disorders involving the immune mechanism: Secondary | ICD-10-CM | POA: Diagnosis not present

## 2018-06-10 DIAGNOSIS — Z91018 Allergy to other foods: Secondary | ICD-10-CM | POA: Insufficient documentation

## 2018-06-10 DIAGNOSIS — Z8673 Personal history of transient ischemic attack (TIA), and cerebral infarction without residual deficits: Secondary | ICD-10-CM | POA: Insufficient documentation

## 2018-06-10 DIAGNOSIS — M1990 Unspecified osteoarthritis, unspecified site: Secondary | ICD-10-CM | POA: Insufficient documentation

## 2018-06-10 DIAGNOSIS — Z9889 Other specified postprocedural states: Secondary | ICD-10-CM | POA: Diagnosis not present

## 2018-06-10 DIAGNOSIS — I2510 Atherosclerotic heart disease of native coronary artery without angina pectoris: Secondary | ICD-10-CM | POA: Diagnosis not present

## 2018-06-10 DIAGNOSIS — Z87891 Personal history of nicotine dependence: Secondary | ICD-10-CM | POA: Diagnosis not present

## 2018-06-10 DIAGNOSIS — Z96653 Presence of artificial knee joint, bilateral: Secondary | ICD-10-CM | POA: Insufficient documentation

## 2018-06-10 DIAGNOSIS — Z79899 Other long term (current) drug therapy: Secondary | ICD-10-CM | POA: Diagnosis not present

## 2018-06-10 DIAGNOSIS — J449 Chronic obstructive pulmonary disease, unspecified: Secondary | ICD-10-CM | POA: Diagnosis not present

## 2018-06-10 DIAGNOSIS — Z905 Acquired absence of kidney: Secondary | ICD-10-CM | POA: Insufficient documentation

## 2018-06-10 DIAGNOSIS — R0609 Other forms of dyspnea: Secondary | ICD-10-CM

## 2018-06-10 DIAGNOSIS — M109 Gout, unspecified: Secondary | ICD-10-CM | POA: Insufficient documentation

## 2018-06-10 DIAGNOSIS — Z823 Family history of stroke: Secondary | ICD-10-CM | POA: Diagnosis not present

## 2018-06-10 DIAGNOSIS — Z85828 Personal history of other malignant neoplasm of skin: Secondary | ICD-10-CM | POA: Insufficient documentation

## 2018-06-10 DIAGNOSIS — R0600 Dyspnea, unspecified: Secondary | ICD-10-CM

## 2018-06-10 DIAGNOSIS — Z955 Presence of coronary angioplasty implant and graft: Secondary | ICD-10-CM

## 2018-06-10 HISTORY — PX: LEFT HEART CATH AND CORONARY ANGIOGRAPHY: CATH118249

## 2018-06-10 HISTORY — PX: CORONARY STENT INTERVENTION: CATH118234

## 2018-06-10 LAB — POCT ACTIVATED CLOTTING TIME: Activated Clotting Time: 367 seconds

## 2018-06-10 SURGERY — LEFT HEART CATH AND CORONARY ANGIOGRAPHY
Anesthesia: LOCAL

## 2018-06-10 MED ORDER — VERAPAMIL HCL 2.5 MG/ML IV SOLN
INTRAVENOUS | Status: DC | PRN
  Administered 2018-06-10: 10 mL via INTRA_ARTERIAL

## 2018-06-10 MED ORDER — CLOPIDOGREL BISULFATE 75 MG PO TABS: 75.0000 mg | ORAL_TABLET | Freq: Every day | ORAL | 5 refills | Status: DC

## 2018-06-10 MED ORDER — IPRATROPIUM-ALBUTEROL 20-100 MCG/ACT IN AERS: 1.0000 | INHALATION_SPRAY | Freq: Four times a day (QID) | RESPIRATORY_TRACT | Status: DC | PRN

## 2018-06-10 MED ORDER — ESCITALOPRAM OXALATE 20 MG PO TABS: 20.0000 mg | ORAL_TABLET | Freq: Every day | ORAL | Status: DC

## 2018-06-10 MED ORDER — SODIUM CHLORIDE 0.9% FLUSH: 3.0000 mL | INTRAVENOUS | Status: DC | PRN

## 2018-06-10 MED ORDER — SODIUM CHLORIDE 0.9 % IV SOLN: 250.0000 mL | INTRAVENOUS | Status: DC | PRN

## 2018-06-10 MED ORDER — NITROGLYCERIN 1 MG/10 ML FOR IR/CATH LAB
INTRA_ARTERIAL | Status: DC | PRN
  Administered 2018-06-10: 200 ug via INTRACORONARY

## 2018-06-10 MED ORDER — MIDAZOLAM HCL 2 MG/2ML IJ SOLN
INTRAMUSCULAR | Status: DC | PRN
  Administered 2018-06-10: 2 mg via INTRAVENOUS
  Administered 2018-06-10: 1 mg via INTRAVENOUS

## 2018-06-10 MED ORDER — HEPARIN SODIUM (PORCINE) 1000 UNIT/ML IJ SOLN
INTRAMUSCULAR | Status: AC
  Filled 2018-06-10: qty 1

## 2018-06-10 MED ORDER — IOHEXOL 350 MG/ML SOLN
INTRAVENOUS | Status: DC | PRN
  Administered 2018-06-10: 95 mL via INTRA_ARTERIAL

## 2018-06-10 MED ORDER — NITROGLYCERIN 0.4 MG SL SUBL: 0.4000 mg | SUBLINGUAL_TABLET | SUBLINGUAL | 12 refills | Status: DC | PRN

## 2018-06-10 MED ORDER — CLOPIDOGREL BISULFATE 300 MG PO TABS
ORAL_TABLET | ORAL | Status: AC
  Filled 2018-06-10: qty 2

## 2018-06-10 MED ORDER — LABETALOL HCL 5 MG/ML IV SOLN: 10.0000 mg | INTRAVENOUS | Status: DC | PRN

## 2018-06-10 MED ORDER — CLOPIDOGREL BISULFATE 300 MG PO TABS
ORAL_TABLET | ORAL | Status: DC | PRN
  Administered 2018-06-10: 600 mg via ORAL

## 2018-06-10 MED ORDER — SODIUM CHLORIDE 0.9% FLUSH: 3.0000 mL | Freq: Two times a day (BID) | INTRAVENOUS | Status: DC

## 2018-06-10 MED ORDER — TIROFIBAN HCL IV 12.5 MG/250 ML
INTRAVENOUS | Status: AC
  Filled 2018-06-10: qty 250

## 2018-06-10 MED ORDER — MIDAZOLAM HCL 2 MG/2ML IJ SOLN
INTRAMUSCULAR | Status: AC
  Filled 2018-06-10: qty 2

## 2018-06-10 MED ORDER — CLOPIDOGREL BISULFATE 75 MG PO TABS: 75.0000 mg | ORAL_TABLET | Freq: Every day | ORAL | 0 refills | Status: DC

## 2018-06-10 MED ORDER — ALLOPURINOL 300 MG PO TABS: 300.0000 mg | ORAL_TABLET | Freq: Every day | ORAL | Status: DC

## 2018-06-10 MED ORDER — NITROGLYCERIN 1 MG/10 ML FOR IR/CATH LAB
INTRA_ARTERIAL | Status: AC
  Filled 2018-06-10: qty 10

## 2018-06-10 MED ORDER — METHOCARBAMOL 750 MG PO TABS: 750.0000 mg | ORAL_TABLET | Freq: Three times a day (TID) | ORAL | Status: DC

## 2018-06-10 MED ORDER — HYDRALAZINE HCL 20 MG/ML IJ SOLN: 5.0000 mg | INTRAMUSCULAR | Status: DC | PRN

## 2018-06-10 MED ORDER — FENTANYL CITRATE (PF) 100 MCG/2ML IJ SOLN
INTRAMUSCULAR | Status: DC | PRN
  Administered 2018-06-10 (×2): 25 ug via INTRAVENOUS

## 2018-06-10 MED ORDER — LIDOCAINE HCL (PF) 1 % IJ SOLN
INTRAMUSCULAR | Status: AC
  Filled 2018-06-10: qty 30

## 2018-06-10 MED ORDER — ASPIRIN 81 MG PO CHEW: 81.0000 mg | CHEWABLE_TABLET | ORAL | Status: DC

## 2018-06-10 MED ORDER — ALBUTEROL SULFATE (2.5 MG/3ML) 0.083% IN NEBU: 2.5000 mg | INHALATION_SOLUTION | Freq: Four times a day (QID) | RESPIRATORY_TRACT | Status: DC | PRN

## 2018-06-10 MED ORDER — ONDANSETRON HCL 4 MG/2ML IJ SOLN: 4.0000 mg | Freq: Four times a day (QID) | INTRAMUSCULAR | Status: DC | PRN

## 2018-06-10 MED ORDER — FENTANYL CITRATE (PF) 100 MCG/2ML IJ SOLN
INTRAMUSCULAR | Status: AC
  Filled 2018-06-10: qty 2

## 2018-06-10 MED ORDER — HEPARIN (PORCINE) IN NACL 1000-0.9 UT/500ML-% IV SOLN
INTRAVENOUS | Status: DC | PRN
  Administered 2018-06-10 (×2): 500 mL

## 2018-06-10 MED ORDER — CLOPIDOGREL BISULFATE 75 MG PO TABS
75.0000 mg | ORAL_TABLET | Freq: Every day | ORAL | Status: DC
Start: 2018-06-11 — End: 2018-06-10

## 2018-06-10 MED ORDER — HEPARIN SODIUM (PORCINE) 1000 UNIT/ML IJ SOLN
INTRAMUSCULAR | Status: DC | PRN
  Administered 2018-06-10: 6000 [IU] via INTRAVENOUS

## 2018-06-10 MED ORDER — ROSUVASTATIN CALCIUM 20 MG PO TABS: 20.0000 mg | ORAL_TABLET | Freq: Every day | ORAL | 11 refills | Status: DC

## 2018-06-10 MED ORDER — SODIUM CHLORIDE 0.9 % WEIGHT BASED INFUSION: 1.0000 mL/kg/h | INTRAVENOUS | Status: DC

## 2018-06-10 MED ORDER — LIDOCAINE HCL (PF) 1 % IJ SOLN
INTRAMUSCULAR | Status: DC | PRN
  Administered 2018-06-10: 2 mL

## 2018-06-10 MED ORDER — TIROFIBAN HCL IV 12.5 MG/250 ML: INTRAVENOUS | Status: DC | PRN

## 2018-06-10 MED ORDER — SODIUM CHLORIDE 0.9 % WEIGHT BASED INFUSION
3.0000 mL/kg/h | INTRAVENOUS | Status: AC
  Administered 2018-06-10: 3 mL/kg/h via INTRAVENOUS

## 2018-06-10 MED ORDER — HEPARIN (PORCINE) IN NACL 1000-0.9 UT/500ML-% IV SOLN
INTRAVENOUS | Status: AC
  Filled 2018-06-10: qty 1000

## 2018-06-10 MED ORDER — VERAPAMIL HCL 2.5 MG/ML IV SOLN
INTRAVENOUS | Status: AC
  Filled 2018-06-10: qty 2

## 2018-06-10 MED ORDER — TIROFIBAN (AGGRASTAT) BOLUS VIA INFUSION
INTRAVENOUS | Status: DC | PRN
  Administered 2018-06-10: 2847.5 ug via INTRAVENOUS

## 2018-06-10 MED ORDER — ASPIRIN 81 MG PO CHEW: 81.0000 mg | CHEWABLE_TABLET | Freq: Every day | ORAL | Status: DC

## 2018-06-10 MED ORDER — IRBESARTAN 300 MG PO TABS: 300.0000 mg | ORAL_TABLET | Freq: Every day | ORAL | Status: DC

## 2018-06-10 MED ORDER — ADULT MULTIVITAMIN W/MINERALS CH: 1.0000 | ORAL_TABLET | Freq: Every day | ORAL | Status: DC

## 2018-06-10 MED ORDER — ACETAMINOPHEN 325 MG PO TABS
650.0000 mg | ORAL_TABLET | ORAL | Status: DC | PRN
  Administered 2018-06-10: 650 mg via ORAL
  Filled 2018-06-10: qty 2

## 2018-06-10 MED ORDER — TIROFIBAN HCL IV 12.5 MG/250 ML
INTRAVENOUS | Status: AC | PRN
  Administered 2018-06-10: 0.15 ug/kg/min via INTRAVENOUS

## 2018-06-10 MED ORDER — ANGIOPLASTY BOOK
Freq: Once | Status: DC
  Filled 2018-06-10: qty 1

## 2018-06-10 MED ORDER — GABAPENTIN 300 MG PO CAPS: 300.0000 mg | ORAL_CAPSULE | Freq: Three times a day (TID) | ORAL | Status: DC

## 2018-06-10 MED ORDER — SODIUM CHLORIDE 0.9 % IV SOLN: INTRAVENOUS | Status: AC

## 2018-06-10 MED ORDER — HEPARIN SODIUM (PORCINE) 1000 UNIT/ML IJ SOLN
INTRAMUSCULAR | Status: DC | PRN
  Administered 2018-06-10: 5000 [IU] via INTRAVENOUS

## 2018-06-10 MED FILL — CLOPIDOGREL 75 MG TABLET: 75 | 30 days supply | Qty: 30 | Fill #0

## 2018-06-10 SURGICAL SUPPLY — 17 items
BALLN SAPPHIRE 2.5X15 (BALLOONS) ×2
BALLN SAPPHIRE ~~LOC~~ 3.0X12 (BALLOONS) ×1 IMPLANT
BALLOON SAPPHIRE 2.5X15 (BALLOONS) IMPLANT
CATH 5FR JL3.5 JR4 ANG PIG MP (CATHETERS) ×1 IMPLANT
CATH LAUNCHER 6FR EBU 3 (CATHETERS) ×1 IMPLANT
DEVICE RAD COMP TR BAND LRG (VASCULAR PRODUCTS) ×1 IMPLANT
GLIDESHEATH SLEND SS 6F .021 (SHEATH) ×1 IMPLANT
GUIDEWIRE INQWIRE 1.5J.035X260 (WIRE) IMPLANT
INQWIRE 1.5J .035X260CM (WIRE) ×2
KIT ENCORE 26 ADVANTAGE (KITS) ×1 IMPLANT
KIT HEART LEFT (KITS) ×2 IMPLANT
KIT HEMO VALVE WATCHDOG (MISCELLANEOUS) ×1 IMPLANT
PACK CARDIAC CATHETERIZATION (CUSTOM PROCEDURE TRAY) ×2 IMPLANT
STENT ORSIRO 2.75X18 (Permanent Stent) ×1 IMPLANT
TRANSDUCER W/STOPCOCK (MISCELLANEOUS) ×2 IMPLANT
TUBING CIL FLEX 10 FLL-RA (TUBING) ×2 IMPLANT
WIRE ASAHI PROWATER 180CM (WIRE) ×1 IMPLANT

## 2018-06-10 NOTE — Progress Notes (Signed)
[id number] Discussed with pt the importance of plavix with stent. Reviewed NTG use, heart healthy diet given, ex ed and discussed CRP 2. Will refer to [place] program. Pt voiced understanding. Graylon Good RN BSN 06/10/2018 3:28 PM

## 2018-06-10 NOTE — Discharge Instructions (Signed)

## 2018-06-10 NOTE — Discharge Summary (Addendum)
Discharge Summary/Same Day PCI    Patient ID: [name],  MRN: [id number], DOB/AGE: 75-Apr-1942 75 y.o.  Admit date: 06/10/2018 Discharge date: 06/10/2018  Primary Care Provider: Harlan Browning Primary Cardiologist: Glenetta Hew, MD  Discharge Diagnoses    Active Problems:   DOE (dyspnea on exertion)   Allergies Allergies  Allergen Reactions  . Garlic Anaphylaxis  . Onion Anaphylaxis  . Codeine Other (See Comments)    "PASSES OUT"  . Hydrocodone     Pt states that he is allergic to synthetic hydrocodone.  . Novocain [Procaine] Nausea Only    Diagnostic Studies/Procedures    LEFT HEART CATH AND CORONARY ANGIOGRAPHY 06/10/18  Conclusion    Prox RCA lesion is 25% stenosed.  Ost 1st Mrg lesion is 50% stenosed.  Prox Cx to Mid Cx lesion is 40% stenosed.  Ost 2nd Mrg lesion is 40% stenosed.  2nd Mrg lesion is 50% stenosed.  Prox LAD lesion is 30% stenosed.  Ost 1st Diag lesion is 75% stenosed.  Mid LAD lesion is 90% stenosed.  A drug-eluting stent was successfully placed using a STENT ORSIRO [id number].  Post intervention, there is a 0% residual stenosis.  LV end diastolic pressure is normal.  There is no aortic valve stenosis.  Recommend to resume Apixaban, at currently prescribed dose and frequency, on 06/11/18.  Recommend concurrent antiplatelet therapy of Aspirin 68m daily for 1 month and Clopidogrel 773mdaily for 6 months.   He will need to start a statin as well.  Per Dr. HaEllyn Hackwill start Rosuvastatin 20 mg daily.    Diagnostic Diagram       Post-Intervention Diagram         _____________   History of Present Illness     [name] is a [age].o. male with history of known OSA on CPAP and COPD (as well as C-spine stenosis). Mr. Appleget was seen on 04/07/2018 by Dr. HaEllyn Hackvaluation of exertional shortness of breath.  He was not having chest pain.  His cardiovascular risk factors include hypertension, hyperlipidemia, OSA, male  gender and age.  Echocardiogram done in March was notable only for moderate LVH and grade 1 diastolic dysfunction, no regional wall motion abnormalities.  He was arranged to have a cardiac CTA which showed hemodynamically significant distal LAD, D2, mid circumflex and OM 2 lesions.  He was arranged for diagnostic heart catheterization.   Hospital Course     Consultants: None  Mr Appleget underwent left heart catheterization today and found to have 90% mid LAD lesion which was successfully treated with a drug-eluting stent.  See above report.  He also had 25% stenosis of the proximal RCA, 50% first marginal, 40% proximal to mid circumflex, 40% second marginal, 30% proximal LAD, and 75% first diagonal.  LV end-diastolic pressure was normal and there was no aortic valve stenosis.  His apixaban will be resumed tomorrow, 06/11/18, at currently prescribed dose and frequency.  He will be discharged on concurrent antiplatelet therapy with aspirin 81 mg daily for 1 month and clopidogrel 75 mg daily for 6 months.  He has been started on rosuvastatin 20 mg daily.  He has been seen by cardiac rehab.  His right radial cath site is stable.  The patient will be discharged home with follow-up in the office.  Patient has been seen by Dr. VaIrish Lackoday and deemed ready for discharge home. All follow up appointments have been scheduled. Discharge medications are listed below. _____________  Discharge Vitals Blood  pressure (!) 158/51, pulse (!) 45, temperature 98.3 F (36.8 C), temperature source Oral, resp. rate 12, height 5' 11"  (1.803 m), weight 251 lb (113.9 kg), SpO2 97 %.  Filed Weights   06/10/18 0619  Weight: 251 lb (113.9 kg)    Labs & Radiologic Studies    CBC No results for input(s): WBC, NEUTROABS, HGB, HCT, MCV, PLT in the last 72 hours. Basic Metabolic Panel No results for input(s): NA, K, CL, CO2, GLUCOSE, BUN, CREATININE, CALCIUM, MG, PHOS in the last 72 hours. Liver Function Tests No  results for input(s): AST, ALT, ALKPHOS, BILITOT, PROT, ALBUMIN in the last 72 hours. No results for input(s): LIPASE, AMYLASE in the last 72 hours. Cardiac Enzymes No results for input(s): CKTOTAL, CKMB, CKMBINDEX, TROPONINI in the last 72 hours. BNP Invalid input(s): POCBNP D-Dimer No results for input(s): DDIMER in the last 72 hours. Hemoglobin A1C No results for input(s): HGBA1C in the last 72 hours. Fasting Lipid Panel No results for input(s): CHOL, HDL, LDLCALC, TRIG, CHOLHDL, LDLDIRECT in the last 72 hours. Thyroid Function Tests No results for input(s): TSH, T4TOTAL, T3FREE, THYROIDAB in the last 72 hours.  Invalid input(s): FREET3 _____________  Ct Coronary Morph W/cta Cor W/score W/ca W/cm &/or Wo/cm  Addendum Date: 06/04/2018   ADDENDUM REPORT: 05/29/2018 16:50 CLINICAL DATA:  Chest pain EXAM: Cardiac CTA MEDICATIONS: Sub lingual nitro. 82m and lopressor 175mTECHNIQUE: The patient was scanned on a Siemens Force [id number] scanner. Gantry rotation speed was 270 msecs. Collimation was .65m15mA 100 kV prospective scan was triggered in the descending thoracic aorta at 111 HU's with 5% padding centered around 78% of the R-R interval. Average HR during the scan was 58 bpm. The 3D data set was interpreted on a dedicated work station using MPR, MIP and VRT modes. A total of 80 cc of contrast was used. FINDINGS: Non-cardiac: [facility] No significant findings on limited lung and soft tissue windows. Calcium Score: Calcium noted in all 3 major epicardial vessels as well as D2, OM1 and OM2 Coronary Arteries: Right dominant with no anomalies LM: Short segment normal near separate ostia of circumflex and LAD LAD: 50% calcific plaque in the proximal vessel D1: Normal D2: 50-75% calcific plaque in proximal vessel Circumflex: Less than 50% calcific plaque proximally. 50-75% calcific plaque in the mid vessel OM1: 50-75% calcific plaque in proximal vessel OM2: 50-75%  calcific plaque in the proximal vessel extending from the mid circumflex RCA: Less than 30% calcific plaque proximally PDA: Normal PLA: Normal IMPRESSION: 1.  Calcium score 416 which is 62nd percentile for age and sex 2. Possible obstructive CAD in mid circumflex OM1/OM2, D2 and moderate disease in proximal LAD Study will be sent for FFR-CT 3.  Normal aortic root diameter 3.2 cm PetJenkins Rougeectronically Signed   By: PetJenkins RougeD.   On: 05/29/2018 16:50   Result Date: 05/29/2018 EXAM: OVER-READ INTERPRETATION  CT CHEST The following report is an over-read performed by radiologist Dr. KevRolm Baptise [facility] [place] 05/29/2018. This over-read does not include interpretation of cardiac or coronary anatomy or pathology. The coronary CTA interpretation by the cardiologist is attached. COMPARISON:  09/18/2017 FINDINGS: Vascular: Heart is normal size. Visualized aorta is normal caliber. Scattered descending aortic calcifications. Mediastinum/Nodes: No adenopathy in the lower mediastinum or hila. Lungs/Pleura: Minimal dependent atelectasis in the lower lobes. No effusions. Upper Abdomen: Imaging into the upper abdomen shows no acute findings. Musculoskeletal: Chest wall soft tissues are unremarkable.  No acute bony abnormality. IMPRESSION: No acute extra cardiac abnormality. Scattered descending aortic calcifications. Electronically Signed: By: Rolm Baptise M.D. On: 05/29/2018 12:46   Ct Coronary Fractional Flow Reserve Data Prep  Result Date: 05/30/2018 CLINICAL DATA:  CAD EXAM: FFR-CT TECHNIQUE: The best systolic and diastolic phases of the patients gated cardiac CT sent to [facility] for hemodynamic analysis FINDINGS: RCA: .98 LAD .86 mid .68 distal D2 .86 Circumflex .76 mid .90 OM1 .74 OM2 IMPRESSION: Positive FFR CT suggesting hemodynamically significant stenosis in mid circumflex/OM2 and distal LAD Patient will be referred for diagnostic heart cath with Dr Robyne Askew Electronically  Signed   By: Jenkins Rouge M.D.   On: 05/30/2018 08:37   Ct Coronary Fractional Flow Reserve Fluid Analysis  Result Date: 06/02/2018 CLINICAL DATA:  CAD EXAM: FFR-CT TECHNIQUE: The best systolic and diastolic phases of the patients gated cardiac CT were sent to [facility] for hemodynamic modeling FINDINGS: LAD: Proximal.90, mid.70 distal.68 D2.80 RCA:.98 Circumflex: Mid.76 OM2.74 IMPRESSION: Hemodynamically significant distal LAD, D2, mid circumflex and OM2 lesions Patient will f/u with Dr Ellyn Hack to discuss proceeding with diagnostic heart catheterization Jenkins Rouge Electronically Signed   By: Jenkins Rouge M.D.   On: 06/02/2018 07:46   Disposition   Pt is being discharged home today in good condition.  Follow-up Plans & Appointments    Follow-up Information    [facility], [place] Follow up.   Specialties:  Cardiology, Radiology Why:  Cardiology hospital follow-up on August 20 at 11:00.  These arrive 15 minutes early for check-in. Contact information: [address] [place] [address] [phone]          Discharge Instructions    AMB Referral to Cardiac Rehabilitation - Phase II   Complete by:  As directed    Diagnosis:  Coronary Stents   AMB Referral to Cardiac Rehabilitation - Phase II   Complete by:  As directed    Diagnosis:  Coronary Stents   Amb Referral to Cardiac Rehabilitation   Complete by:  As directed    Diagnosis:  Coronary Stents      Discharge Medications   Allergies as of 06/10/2018      Reactions   Garlic Anaphylaxis   Onion Anaphylaxis   Codeine Other (See Comments)   "PASSES OUT"   Hydrocodone    Pt states that he is allergic to synthetic hydrocodone.   Novocain [procaine] Nausea Only      Medication List    STOP taking these medications   tadalafil 20 MG tablet Commonly known as:  ADCIRCA/CIALIS     TAKE these medications   albuterol (2.5 MG/3ML) 0.083% nebulizer solution Commonly known as:  PROVENTIL Take 2.5 mg by  nebulization every 6 (six) hours as needed for wheezing or shortness of breath.   allopurinol 300 MG tablet Commonly known as:  ZYLOPRIM Take 300 mg by mouth daily.   clopidogrel 75 MG tablet Commonly known as:  PLAVIX Take 1 tablet (75 mg total) by mouth daily.   clopidogrel 75 MG tablet Commonly known as:  PLAVIX Take 1 tablet (75 mg total) by mouth daily with breakfast. Start taking on:  06/11/2018   COMBIVENT RESPIMAT 20-100 MCG/ACT Aers respimat Generic drug:  Ipratropium-Albuterol Inhale 1 puff into the lungs every 6 (six) hours as needed for wheezing or shortness of breath.   ELIQUIS 5 MG Tabs tablet Generic drug:  apixaban Take 5 mg by mouth 2 (two) times daily.   escitalopram 20 MG tablet Commonly  known as:  LEXAPRO Take 20 mg by mouth daily.   Fish Oil 1200 MG Caps Take 1 capsule by mouth daily.   gabapentin 300 MG capsule Commonly known as:  NEURONTIN Take 300 mg by mouth 3 (three) times daily.   methocarbamol 750 MG tablet Commonly known as:  ROBAXIN Take 750 mg by mouth 3 (three) times daily.   multivitamin with minerals Tabs tablet Take 1 tablet by mouth daily.   nitroGLYCERIN 0.4 MG SL tablet Commonly known as:  NITROSTAT Place 1 tablet (0.4 mg total) under the tongue every 5 (five) minutes as needed for chest pain.   rosuvastatin 20 MG tablet Commonly known as:  CRESTOR Take 1 tablet (20 mg total) by mouth at bedtime.   Testosterone Cypionate 200 MG/ML Kit Inject 400 mg into the muscle every 14 (fourteen) days. For IM use only   TYLENOL 500 MG tablet Generic drug:  acetaminophen Take 1,000 mg by mouth 2 (two) times daily as needed for moderate pain. And prn   valsartan 320 MG tablet Commonly known as:  DIOVAN Take 320 mg by mouth daily.        Acute coronary syndrome (MI, NSTEMI, STEMI, etc) this admission?: No.    Outstanding Labs/Studies     Duration of Discharge Encounter   Greater than 30 minutes including physician  time.  Signed, Daune Perch, NP 06/10/2018, 6:26 PM  I have examined the patient and reviewed assessment and plan and discussed with patient.  Agree with above as stated.    Recommend to resume Apixaban, at currently prescribed dose and frequency, on 06/11/18.  Recommend concurrent antiplatelet therapy of Aspirin 26m daily for 1 month and Clopidogrel 750mdaily for 6 months.   He will need to start a statin as well.  Per Dr. HaEllyn Hackwill start Rosuvastatin 20 mg daily.   Frequent PVCs noted.  Will wait on starting metoprolol, per Dr. HaAllison Quarryequest, until he tolerates Rosuvastatin initiation.     Right wrist precautions given.  No hematoma.  Plan for f/u with Dr. HaEllyn Hack JaLarae Grooms

## 2018-06-10 NOTE — Interval H&P Note (Signed)
Cath Lab Visit (complete for each Cath Lab visit)  Clinical Evaluation Leading to the Procedure:   ACS: No.  Non-ACS:    Anginal Classification: CCS III  Anti-ischemic medical therapy: Minimal Therapy (1 class of medications)  Non-Invasive Test Results: Intermediate-risk stress test findings: cardiac mortality 1-3%/year  Prior CABG: No previous CABG      History and Physical Interval Note:  06/10/2018 11:42 AM  [name]  has presented today for surgery, with the diagnosis of abnormal cardiac CT  The various methods of treatment have been discussed with the patient and family. After consideration of risks, benefits and other options for treatment, the patient has consented to  Procedure(s): LEFT HEART CATH AND CORONARY ANGIOGRAPHY (N/A) as a surgical intervention .  The patient's history has been reviewed, patient examined, no change in status, stable for surgery.  I have reviewed the patient's chart and labs.  Questions were answered to the patient's satisfaction.     Larae Grooms

## 2018-06-11 ENCOUNTER — Encounter (HOSPITAL_COMMUNITY): Payer: Self-pay | Admitting: Interventional Cardiology

## 2018-06-11 ENCOUNTER — Other Ambulatory Visit: Payer: Self-pay

## 2018-06-11 ENCOUNTER — Telehealth: Payer: Self-pay | Admitting: Cardiology

## 2018-06-11 MED ORDER — ROSUVASTATIN CALCIUM 20 MG PO TABS: 20.0000 mg | ORAL_TABLET | Freq: Every day | ORAL | 11 refills | Status: DC

## 2018-06-11 MED ORDER — NITROGLYCERIN 0.4 MG SL SUBL: 0.4000 mg | SUBLINGUAL_TABLET | SUBLINGUAL | 12 refills | Status: DC | PRN

## 2018-06-11 NOTE — Progress Notes (Signed)
PA did not add asa 81mg  to med list/ Dr Hassell Done note states to be on for 1 month/ pt was informed and verbalized understanding. Pt to f/u with Dr Ellyn Hack for further information.

## 2018-06-11 NOTE — Telephone Encounter (Signed)
New message   Pt c/o medication issue:  1. Name of Medication: Crestor and Nitroglycerin  2. How are you currently taking this medication (dosage and times per day)? no  3. Are you having a reaction (difficulty breathing--STAT)? No   4. What is your medication issue? Patient was advised that these medications went over to [facility] but the patients states that they are not at the pharmacy.

## 2018-06-11 NOTE — Telephone Encounter (Signed)
° ° ° °  New Message:   Pt had a stent put in yesterday. He wants to know how long is he supposed to take the Aspirin?

## 2018-06-11 NOTE — Telephone Encounter (Signed)
Recommend to resume Apixaban, at currently prescribed dose and frequency, on 06/11/18.  Recommend concurrent antiplatelet therapy of Aspirin 81mg  daily for 1 month and Clopidogrel 75mg  daily for 6 months.   Above is the recommendation after his cath.  The ASA would stop after one month.  Please call him.

## 2018-06-11 NOTE — Telephone Encounter (Signed)
Called and advised patient that we had sent in the RX to the correct pharmacy.

## 2018-06-11 NOTE — Telephone Encounter (Signed)
Pt is wanting to know how long he needs to stay on Asa .Will forward to Dr Ellyn Hack for recommendations .Adonis Housekeeper

## 2018-06-12 ENCOUNTER — Other Ambulatory Visit: Payer: Self-pay | Admitting: *Deleted

## 2018-06-12 MED ORDER — NITROGLYCERIN 0.4 MG SL SUBL: 0.4000 mg | SUBLINGUAL_TABLET | SUBLINGUAL | 3 refills | Status: DC | PRN

## 2018-06-12 NOTE — Telephone Encounter (Signed)
Pt aware of directions on meds ./cy

## 2018-06-14 DIAGNOSIS — G4733 Obstructive sleep apnea (adult) (pediatric): Secondary | ICD-10-CM | POA: Diagnosis not present

## 2018-06-14 DIAGNOSIS — R269 Unspecified abnormalities of gait and mobility: Secondary | ICD-10-CM | POA: Diagnosis not present

## 2018-06-16 DIAGNOSIS — M25811 Other specified joint disorders, right shoulder: Secondary | ICD-10-CM | POA: Diagnosis not present

## 2018-06-16 DIAGNOSIS — M7542 Impingement syndrome of left shoulder: Secondary | ICD-10-CM | POA: Diagnosis not present

## 2018-06-24 ENCOUNTER — Telehealth (HOSPITAL_COMMUNITY): Payer: Self-pay

## 2018-06-24 ENCOUNTER — Encounter: Payer: Self-pay | Admitting: Physician Assistant

## 2018-06-24 ENCOUNTER — Ambulatory Visit: Payer: Medicare Other | Admitting: Physician Assistant

## 2018-06-24 ENCOUNTER — Telehealth (HOSPITAL_COMMUNITY): Payer: Self-pay | Admitting: *Deleted

## 2018-06-24 VITALS — BP 132/71 | HR 70 | Ht 71.5 in | Wt 244.2 lb

## 2018-06-24 DIAGNOSIS — Z79899 Other long term (current) drug therapy: Secondary | ICD-10-CM | POA: Diagnosis not present

## 2018-06-24 DIAGNOSIS — I825Y9 Chronic embolism and thrombosis of unspecified deep veins of unspecified proximal lower extremity: Secondary | ICD-10-CM

## 2018-06-24 DIAGNOSIS — Z955 Presence of coronary angioplasty implant and graft: Secondary | ICD-10-CM

## 2018-06-24 DIAGNOSIS — G4733 Obstructive sleep apnea (adult) (pediatric): Secondary | ICD-10-CM | POA: Diagnosis not present

## 2018-06-24 DIAGNOSIS — Z8673 Personal history of transient ischemic attack (TIA), and cerebral infarction without residual deficits: Secondary | ICD-10-CM

## 2018-06-24 DIAGNOSIS — E785 Hyperlipidemia, unspecified: Secondary | ICD-10-CM

## 2018-06-24 NOTE — Telephone Encounter (Signed)
Attempted to contact patient in regards to Cardiac Rehab - lm on vm °

## 2018-06-24 NOTE — Patient Instructions (Signed)
Medication Instructions:  Stop aspirin 9/6 or when you run out of current bottle  Labwork: Please return for FASTING labs in 6-8 weeks (Lipid, Hepatic)  Our in office lab hours are Monday-Friday 8:00-4:00, closed for lunch 12:45-1:45 pm.  No appointment needed.  Follow-Up: Your physician wants you to follow-up in: 3-4 months with Dr. Ellyn Hack.  You will receive a reminder letter in the mail two months in advance. If you don't receive a letter, please call our office to schedule the follow-up appointment.    Any Other Special Instructions Will Be Listed Below (If Applicable).     If you need a refill on your cardiac medications before your next appointment, please call your pharmacy.

## 2018-06-24 NOTE — Progress Notes (Signed)
Cardiology Office Note    Date:  06/24/2018   ID:  [name], DOB [birth year], MRN [id number]  PCP:  Harlan Stains, MD  Cardiologist:  Dr. Ellyn Hack  Chief Complaint  Patient presents with  . Follow-up    seen for Dr. Ellyn Hack, post cath.    History of Present Illness:  [name] is a 77 y.o. male with PMH of GERD, recurrent DVT/PE on Eliquis, obstructive sleep apnea, CVA and recently diagnosed CAD.  Echocardiogram obtained in March 2019 showed normal LV size, EF 60 to 65%, grade 1 DD.  Patient was recently seen by Dr. Ellyn Hack in June 2019 for dyspnea on exertion.  A coronary CT was recommended which came back abnormal on 05/29/2018 showing possible obstructive CAD in the mid left circumflex, OM1, OM 2, D2 and a moderate disease in the proximal LAD.  Subsequent FFR analysis showed hemodynamically significant distal LAD, D2, mid left circumflex and OM 2 lesion.  Patient eventually underwent cardiac catheterization on 06/10/2018 which showed 90% mid LAD lesion treated with Orsiro 2.75 x 18 mm DES, 50% OM 2 lesion, 50% OM1 lesion, 40% proximal to mid left circumflex lesion.  Postprocedure, patient was placed on aspirin and Plavix.  Postprocedure, Eliquis was resumed on 06/16/2018.  The plan is to use aspirin for 1 month and the Plavix for 37-monthafter stent placement.  Patient presents today for cardiology office visit.  He denies any chest pain even prior to the cardiac catheterization.  He has not noticed significant exertional shortness of breath since the cardiac catheterization.  He is aware that he need to discontinue aspirin after finishing a 1 month course.  Otherwise, he has been doing well at home.  We discussed he is recent cardiac catheterization report.  He will need a 6 to 8 weeks fasting lipid panel and LFT given the Crestor.  EKG today showed no ischemic changes.   Past Medical History:  Diagnosis Date  . Anticoagulated on warfarin   . Anxiety   . Arthritis    KNEES,  NECK, SHOULDERS, HANDS  . Asthma   . Biceps tendon tear    right  . Cancer ([facility]    skin cancer-  2 basal cell, 1 squamous cell  . GERD (gastroesophageal reflux disease)   . Gout, arthritis    BILATERAL GREAT TOE-- PER PT STABLE  . H/O transfusion of packed red blood cells    as a child/ s/p tonsillectomy  . History of pulmonary embolism   . Lung nodule    BENIGN RIGHT UPPER--  CHEST CT 08-22-2011  . OSA (obstructive sleep apnea)    NO CPAP -states dr said possibly related to asthma  . S/P dilatation of esophageal stricture   . [place]   . Torn rotator cuff    Right x 3    Past Surgical History:  Procedure Laterality Date  . CARPAL TUNNEL RELEASE Right 2010  . CORONARY CALCIUM SCORING WITH CT CORONARY ANGIOGRAM  05/29/2018   Coronary calcium score 416.  Possible obstructive CAD in the mid circumflex-OM1-OM 2, D2 and a moderate disease in the proximal LAD -->  FFR --> LAD: Proximal.90, mid.86 distal.68 D2.80, Cx: Mid.76 OM2.74: Hemodynamically significant distal LAD, D2, mid circumflex and OM2 lesions  . CORONARY STENT INTERVENTION N/A 06/10/2018   Procedure: CORONARY STENT INTERVENTION;  Surgeon: VJettie Booze MD;  [facility];  Service: Cardiovascular;  Laterality: N/A;  . JOINT REPLACEMENT    . KNEE ARTHROSCOPY  Right   . KNEE ARTHROSCOPY Right   . LEFT HEART CATH AND CORONARY ANGIOGRAPHY N/A 06/10/2018   Procedure: LEFT HEART CATH AND CORONARY ANGIOGRAPHY;  Surgeon: Jettie Booze, MD;  Location: [facility];  Service: Cardiovascular;  Laterality: N/A;  . NEPHRECTOMY    . SHOULDER ARTHROSCOPY Right   . TONSILLECTOMY  AGE 51  . TOTAL KNEE ARTHROPLASTY Left 10-15-2008  . TOTAL KNEE ARTHROPLASTY Right 01/18/2014   Procedure: RIGHT TOTAL KNEE ARTHROPLASTY;  Surgeon: Gearlean Alf, MD;  Location: [facility];  Service: Orthopedics;  Laterality: Right;  . TRANSTHORACIC ECHOCARDIOGRAM  01/2018    Normal LV size with moderate LVH.  EF 60 to 65%  with GR 1 DD (normal for age)  . WRIST GANGLION EXCISION Right 1964    Current Medications: Outpatient Medications Prior to Visit  Medication Sig Dispense Refill  . acetaminophen (TYLENOL) 500 MG tablet Take 1,000 mg by mouth 2 (two) times daily as needed for moderate pain. And prn    . albuterol (PROVENTIL) (2.5 MG/3ML) 0.083% nebulizer solution Take 2.5 mg by nebulization every 6 (six) hours as needed for wheezing or shortness of breath.    . allopurinol (ZYLOPRIM) 300 MG tablet Take 300 mg by mouth daily.    [name] apixaban (ELIQUIS) 5 MG TABS tablet Take 5 mg by mouth 2 (two) times daily.    . clopidogrel (PLAVIX) 75 MG tablet Take 1 tablet (75 mg total) by mouth daily. 30 tablet 0  . escitalopram (LEXAPRO) 20 MG tablet Take 20 mg by mouth daily.     [name] gabapentin (NEURONTIN) 300 MG capsule Take 300 mg by mouth 3 (three) times daily.    . Ipratropium-Albuterol (COMBIVENT RESPIMAT) 20-100 MCG/ACT AERS respimat Inhale 1 puff into the lungs every 6 (six) hours as needed for wheezing or shortness of breath.    . methocarbamol (ROBAXIN) 750 MG tablet Take 750 mg by mouth 3 (three) times daily.    . Multiple Vitamin (MULTIVITAMIN WITH MINERALS) TABS tablet Take 1 tablet by mouth daily.    . nitroGLYCERIN (NITROSTAT) 0.4 MG SL tablet Place 1 tablet (0.4 mg total) under the tongue every 5 (five) minutes as needed for chest pain. 25 tablet 3  . Omega-3 Fatty Acids (FISH OIL) 1200 MG CAPS Take 1 capsule by mouth daily.    . rosuvastatin (CRESTOR) 20 MG tablet Take 1 tablet (20 mg total) by mouth at bedtime. 30 tablet 11  . Testosterone Cypionate 200 MG/ML KIT Inject 400 mg into the muscle every 14 (fourteen) days. For IM use only    . valsartan (DIOVAN) 320 MG tablet Take 320 mg by mouth daily.    . clopidogrel (PLAVIX) 75 MG tablet Take 1 tablet (75 mg total) by mouth daily with breakfast. 30 tablet 5   No facility-administered medications prior to visit.      Allergies:   Garlic; Onion; Codeine;  Hydrocodone; and Novocain [procaine]   Social History   Socioeconomic History  . Marital status: Married    Spouse name: Not on file  . Number of children: 4  . Years of education: some master's classes  . Highest education level: Bachelor's degree (e.g., BA, AB, BS)  Occupational History  . Occupation: CNA  Social Needs  . Financial resource strain: Not on file  . Food insecurity:    Worry: Not on file    Inability: Not on file  . Transportation needs:    Medical: Not on file    Non-medical:  Not on file  Tobacco Use  . Smoking status: Former Smoker    Packs/day: 1.00    Years: 25.00    Pack years: 25.00    Types: Cigarettes    Last attempt to quit: 07/13/1980    Years since quitting: 37.9  . Smokeless tobacco: Never Used  Substance and Sexual Activity  . Alcohol use: Yes    Comment: RARE  . Drug use: No  . Sexual activity: Yes    Partners: Female  Lifestyle  . Physical activity:    Days per week: Not on file    Minutes per session: Not on file  . Stress: Not on file  Relationships  . Social connections:    Talks on phone: Not on file    Gets together: Not on file    Attends religious service: Not on file    Active member of club or organization: Not on file    Attends meetings of clubs or organizations: Not on file    Relationship status: Not on file  Other Topics Concern  . Not on file  Social History Narrative   Lives at home with his wife & youngest daughter and her family   Right handed   Drinks 2 cups of coffee daily, occasional cup of tea     Family History:  The patient's family history includes Colon cancer in his mother; Deep vein thrombosis in his father; Stroke in his brother and father.   ROS:   Please see the history of present illness.    ROS All other systems reviewed and are negative.   PHYSICAL EXAM:   VS:  BP 132/71   Pulse 70   Ht 5' 11.5" (1.816 m)   Wt 244 lb 3.2 oz (110.8 kg)   BMI 33.58 kg/m    GEN: Well nourished, well  developed, in no acute distress  HEENT: normal  Neck: no JVD, carotid bruits, or masses Cardiac: RRR; no murmurs, rubs, or gallops,no edema  Respiratory:  clear to auscultation bilaterally, normal work of breathing GI: soft, nontender, nondistended, + BS MS: no deformity or atrophy  Skin: warm and dry, no rash Neuro:  Alert and Oriented x 3, Strength and sensation are intact Psych: euthymic mood, full affect  Wt Readings from Last 3 Encounters:  06/24/18 244 lb 3.2 oz (110.8 kg)  06/10/18 251 lb (113.9 kg)  06/05/18 251 lb 3.2 oz (113.9 kg)      Studies/Labs Reviewed:   EKG:  EKG is ordered today.  The ekg ordered today demonstrates normal sinus rhythm without significant ST-T wave changes.  Recent Labs: 06/05/2018: BUN 19; Creatinine, Ser 1.33; Hemoglobin 17.5; Platelets 182; Potassium 5.3; Sodium 142   Lipid Panel    Component Value Date/Time   CHOL 186 07/05/2014 0736   TRIG 209 (H) 07/05/2014 0736   HDL 41 07/05/2014 0736   CHOLHDL 4.5 07/05/2014 0736   VLDL 42 (H) 07/05/2014 0736   LDLCALC 103 (H) 07/05/2014 0736    Additional studies/ records that were reviewed today include:   Cath 06/10/2018  Prox RCA lesion is 25% stenosed.  Ost 1st Mrg lesion is 50% stenosed.  Prox Cx to Mid Cx lesion is 40% stenosed.  Ost 2nd Mrg lesion is 40% stenosed.  2nd Mrg lesion is 50% stenosed.  Prox LAD lesion is 30% stenosed.  Ost 1st Diag lesion is 75% stenosed.  Mid LAD lesion is 90% stenosed.  A drug-eluting stent was successfully placed using a STENT ORSIRO 2.75X18.  Post intervention, there is a 0% residual stenosis.  LV end diastolic pressure is normal.  There is no aortic valve stenosis.     Recommend to resume Apixaban, at currently prescribed dose and frequency, on 06/11/18.  Recommend concurrent antiplatelet therapy of Aspirin 49m daily for 1 month and Clopidogrel 717mdaily for 6 months.     ASSESSMENT:    1. Status post coronary artery stent  placement   2. Medication management   3. Hyperlipidemia, unspecified hyperlipidemia type   4. OSA (obstructive sleep apnea)   5. H/O: CVA (cerebrovascular accident)   6. Chronic deep vein thrombosis (DVT) of proximal vein of lower extremity, unspecified laterality (HCC)      PLAN:  In order of problems listed above:  1. CAD status post PCI to mid LAD.  Denies any chest pain, currently on aspirin, Plavix and Eliquis.  The plan is to discontinue aspirin after the first month.  He will need to stop the aspirin after September 6.  I will defer to Dr. HaEllyn Hacko decide whether or not to stop the Plavix after 6-110-month continue through a 12-62-monthiod.  2. Hyperlipidemia: He has been placed on Crestor 20 mg daily.  He will need a fasting lipid panel and LFT in 6 to 8 weeks.  3. History of recurrent DVT/PE: Continue Eliquis    Medication Adjustments/Labs and Tests Ordered: Current medicines are reviewed at length with the patient today.  Concerns regarding medicines are outlined above.  Medication changes, Labs and Tests ordered today are listed in the Patient Instructions below. Patient Instructions  Medication Instructions:  Stop aspirin 9/6 or when you run out of current bottle  Labwork: Please return for FASTING labs in 6-8 weeks (Lipid, Hepatic)  Our in office lab hours are Monday-Friday 8:00-4:00, closed for lunch 12:45-1:45 pm.  No appointment needed.  Follow-Up: Your physician wants you to follow-up in: 3-4 months with Dr. HardEllyn Hackou will receive a reminder letter in the mail two months in advance. If you don't receive a letter, please call our office to schedule the follow-up appointment.    Any Other Special Instructions Will Be Listed Below (If Applicable).     If you need a refill on your cardiac medications before your next appointment, please call your pharmacy.      SignHilbert Corrigan  [place]20/2019 1:11 PM    [facility] [address]  [id number]: [phone]: [phone]

## 2018-06-24 NOTE — Telephone Encounter (Signed)
Pt returned call from message left earlier.  Pt declines to participate. Pt is "already doing more exercise" than we would do.  Encouraged pt to follow up on 8/20 and seek additional advice on how to continue to exercise safely. Will close this referral. Maurice Small RN, BSN Cardiac and Pulmonary Rehab Nurse Navigator

## 2018-06-24 NOTE — Telephone Encounter (Signed)
Patients insurance is active and benefits verified through [facility] - No co-pay, no deductible, out of pocket amount of $5,900/$1,187.05 has been met, 20% co-insurance, and no pre-authorization is required. All codes are valid and billable. Reference # [place] L 06/24/18.

## 2018-07-08 MED FILL — CLOPIDOGREL 75 MG TABLET: 75 | 30 days supply | Qty: 30 | Fill #0

## 2018-08-06 ENCOUNTER — Other Ambulatory Visit: Payer: Self-pay | Admitting: Cardiology

## 2018-08-06 MED FILL — CLOPIDOGREL 75 MG TABLET: 75 | 30 days supply | Qty: 30 | Fill #0

## 2018-08-12 DIAGNOSIS — M542 Cervicalgia: Secondary | ICD-10-CM | POA: Diagnosis not present

## 2018-08-12 DIAGNOSIS — M25811 Other specified joint disorders, right shoulder: Secondary | ICD-10-CM | POA: Diagnosis not present

## 2018-08-19 DIAGNOSIS — E785 Hyperlipidemia, unspecified: Secondary | ICD-10-CM | POA: Diagnosis not present

## 2018-08-19 DIAGNOSIS — Z955 Presence of coronary angioplasty implant and graft: Secondary | ICD-10-CM | POA: Diagnosis not present

## 2018-08-19 DIAGNOSIS — Z79899 Other long term (current) drug therapy: Secondary | ICD-10-CM | POA: Diagnosis not present

## 2018-08-20 LAB — LIPID PANEL
Chol/HDL Ratio: 2.7 ratio (ref 0.0–5.0)
Cholesterol, Total: 106 mg/dL (ref 100–199)
HDL: 39 mg/dL — ABNORMAL LOW (ref >39)
LDL CALC: 48 mg/dL (ref 0–99)
Triglycerides: 97 mg/dL (ref 0–149)
VLDL CHOLESTEROL CAL: 19 mg/dL (ref 5–40)

## 2018-08-20 LAB — HEPATIC FUNCTION PANEL
ALBUMIN: 4.6 g/dL (ref 3.5–4.8)
ALT: 33 IU/L (ref 0–44)
AST: 21 IU/L (ref 0–40)
Alkaline Phosphatase: 63 IU/L (ref 39–117)
Bilirubin Total: 0.7 mg/dL (ref 0.0–1.2)
Bilirubin, Direct: 0.21 mg/dL (ref 0.00–0.40)
TOTAL PROTEIN: 7 g/dL (ref 6.0–8.5)

## 2018-08-27 DIAGNOSIS — M5030 Other cervical disc degeneration, unspecified cervical region: Secondary | ICD-10-CM | POA: Diagnosis not present

## 2018-08-29 DIAGNOSIS — M5412 Radiculopathy, cervical region: Secondary | ICD-10-CM | POA: Diagnosis not present

## 2018-08-29 DIAGNOSIS — M5030 Other cervical disc degeneration, unspecified cervical region: Secondary | ICD-10-CM | POA: Diagnosis not present

## 2018-09-01 DIAGNOSIS — M5030 Other cervical disc degeneration, unspecified cervical region: Secondary | ICD-10-CM | POA: Diagnosis not present

## 2018-09-01 DIAGNOSIS — M79601 Pain in right arm: Secondary | ICD-10-CM | POA: Diagnosis not present

## 2018-09-04 MED FILL — CLOPIDOGREL 75 MG TABLET: 75 | 30 days supply | Qty: 30 | Fill #1

## 2018-09-17 DIAGNOSIS — Z0000 Encounter for general adult medical examination without abnormal findings: Secondary | ICD-10-CM | POA: Diagnosis not present

## 2018-09-17 DIAGNOSIS — I129 Hypertensive chronic kidney disease with stage 1 through stage 4 chronic kidney disease, or unspecified chronic kidney disease: Secondary | ICD-10-CM | POA: Diagnosis not present

## 2018-09-17 DIAGNOSIS — F419 Anxiety disorder, unspecified: Secondary | ICD-10-CM | POA: Diagnosis not present

## 2018-09-17 DIAGNOSIS — N183 Chronic kidney disease, stage 3 (moderate): Secondary | ICD-10-CM | POA: Diagnosis not present

## 2018-09-18 DIAGNOSIS — M5030 Other cervical disc degeneration, unspecified cervical region: Secondary | ICD-10-CM | POA: Diagnosis not present

## 2018-10-07 MED FILL — CLOPIDOGREL 75 MG TABLET: 75 | 30 days supply | Qty: 30 | Fill #2

## 2018-10-08 ENCOUNTER — Ambulatory Visit: Payer: Medicare Other | Admitting: Cardiology

## 2018-10-08 ENCOUNTER — Encounter: Payer: Self-pay | Admitting: Cardiology

## 2018-10-08 VITALS — BP 128/68 | HR 88 | Ht 70.5 in | Wt 241.0 lb

## 2018-10-08 DIAGNOSIS — E785 Hyperlipidemia, unspecified: Secondary | ICD-10-CM

## 2018-10-08 DIAGNOSIS — R0609 Other forms of dyspnea: Secondary | ICD-10-CM | POA: Diagnosis not present

## 2018-10-08 DIAGNOSIS — I10 Essential (primary) hypertension: Secondary | ICD-10-CM | POA: Diagnosis not present

## 2018-10-08 DIAGNOSIS — Z9861 Coronary angioplasty status: Secondary | ICD-10-CM

## 2018-10-08 DIAGNOSIS — R0600 Dyspnea, unspecified: Secondary | ICD-10-CM

## 2018-10-08 DIAGNOSIS — I208 Other forms of angina pectoris: Secondary | ICD-10-CM

## 2018-10-08 DIAGNOSIS — E291 Testicular hypofunction: Secondary | ICD-10-CM | POA: Diagnosis not present

## 2018-10-08 DIAGNOSIS — I2510 Atherosclerotic heart disease of native coronary artery without angina pectoris: Secondary | ICD-10-CM | POA: Diagnosis not present

## 2018-10-08 NOTE — Patient Instructions (Signed)
Medication Instructions:   NO T NEEDED  IF YOU MAY HOLD PLAVIX IF NEEDED  IN FEB 2020 - PLEASE CALL OFFICE FOR INSTRUCTIONS  If you need a refill on your cardiac medications before your next appointment, please call your pharmacy.   Lab work: Not needed If you have labs (blood work) drawn today and your tests are completely normal, you will receive your results only by: [name] MyChart Message (if you have MyChart) OR . A paper copy in the mail If you have any lab test that is abnormal or we need to change your treatment, we will call you to review the results.  Testing/Procedures: Not needed  Follow-Up: At [facility], you and your health needs are our priority.  As part of our continuing mission to provide you with exceptional heart care, we have created designated Provider Care Teams.  These Care Teams include your primary Cardiologist (physician) and Advanced Practice Providers (APPs -  Physician Assistants and Nurse Practitioners) who all work together to provide you with the care you need, when you need it. You will need a follow up appointment in 6 months June /July 2020.  Please call our office 2 months in advance to schedule this appointment.  You may see Glenetta Hew, MD  or one of the following Advanced Practice Providers on your designated Care Team:   Rosaria Ferries, PA-C . Jory Sims, DNP, ANP  Any Other Special Instructions Will Be Listed Below (If Applicable).

## 2018-10-08 NOTE — Progress Notes (Signed)
PCP: Harlan Stains, MD  Clinic Note: Chief Complaint  Patient presents with  . Follow-up    No complaints.  . Coronary Artery Disease    PCI done for class II and III angina and abnormal coronary CTA.    HPI: [name] is a 77 y.o. male with a PMH notable for CAD-PCI below who presents today for second post cath-PCI follow-up.  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  [name] was last seen on June 24, 2018 by Joseph Deforest, PA for his initial post-cath follow-up.  No further episodes of exertional dyspnea.  Recent Hospitalizations: None since Cath.  Studies Personally Reviewed - (if available, images/films reviewed: From Epic Chart or Care Everywhere)  Cardiac Cath-PCI 06/10/2018: 90% mLAD --> DES PCI (ORSIRO 2.75X18) => 0%.  pRCA 25%. p-m Cx 40%. Ost OM1 50%, ost OM2 40% with 50% mid.  pLAD 30% & ost D1 75 % (med Rx).  Normal LVEDP.  Diagnostic       Intervention  Orsiro DES 2.75 x 18       Recommend to resume Apixaban, at currently prescribed dose and frequency, on 06/11/18. Recommend concurrent antiplatelet therapy of Aspirin 39m daily for 1 month and Clopidogrel 751mdaily for 6 months  Interval History: [name] today overall doing well.  He is extremely happy that he is not having any more of his shortness of breath episodes.  He is having a hard time believing that this is something is been going on for a couple years now and we finally have resolved.  He is excited about the fact that now and about the last year he is lost about 20 pounds and is really picked up his exercise level.  About the only thing he notices occasionally he can get a sense of feeling little dizzy sometimes positional but for the most part otherwise doing fine. He never really had chest discomfort although he had some tightness when his dyspnea occurred.  No further episodes since PCI.  No PND, orthopnea or edema.  No rapid irregular  heartbeats or palpitations.  No syncope/near syncope or TIA/amaurosis fugax. He successfully stopped his aspirin and is now just on Plavix plus Eliquis and not having any bleeding issues.  He did have questions about timing of when he could potentially stop for procedures (hoping to not have any necessary, but just wanted to be sure).  No melena, hematochezia, hematuria, or epstaxis. No claudication.  ROS: A comprehensive was performed. Review of Systems  Constitutional: Negative for malaise/fatigue.  HENT: Negative for congestion.   Respiratory: Negative for cough and shortness of breath.   Cardiovascular: Negative for leg swelling.  Gastrointestinal: Negative for abdominal pain and heartburn.  Musculoskeletal: Negative for joint pain and myalgias.  Neurological: Positive for dizziness (see HPI). Negative for weakness and headaches.  Endo/Heme/Allergies: Does not bruise/bleed easily.  Psychiatric/Behavioral: The patient is not nervous/anxious and does not have insomnia.   All other systems reviewed and are negative.   I have reviewed and (if needed) personally updated the patient's problem list, medications, allergies, past medical and surgical history, social and family history.   Past Medical History:  Diagnosis Date  . Anticoagulated on warfarin   . Anxiety   . Arthritis    KNEES, NECK, SHOULDERS, HANDS  . Asthma   . Biceps tendon tear    right  . [place]   skin cancer-  2 basal cell, 1 squamous cell  . GERD (gastroesophageal reflux disease)   . Gout, arthritis    BILATERAL GREAT TOE-- PER PT STABLE  . H/O transfusion of packed red blood cells    as a child/ s/p tonsillectomy  . History of pulmonary embolism   . Lung nodule    BENIGN RIGHT UPPER--  CHEST CT 08-22-2011  . OSA (obstructive sleep apnea)    NO CPAP -states dr said possibly related to asthma  . S/P dilatation of esophageal stricture   . Stroke ([place])   . Torn rotator cuff    Right x 3    Past  Surgical History:  Procedure Laterality Date  . CARPAL TUNNEL RELEASE Right 2010  . CORONARY CALCIUM SCORING WITH CT CORONARY ANGIOGRAM  05/29/2018   Coronary calcium score 416.  Possible obstructive CAD in the mid circumflex-OM1-OM 2, D2 and a moderate disease in the proximal LAD -->  FFR --> LAD: Proximal.90, mid.86 distal.68 D2.80, Cx: Mid.76 OM2.74: Hemodynamically significant distal LAD, D2, mid circumflex and OM2 lesions  . CORONARY STENT INTERVENTION N/A 06/10/2018   Procedure: CORONARY STENT INTERVENTION;  Surgeon: Jettie Booze, MD;  Location: [facility];;  90% mLAD --> DES PCI (ORSIRO 2.75X18) => 0%  . JOINT REPLACEMENT    . LEFT HEART CATH AND CORONARY ANGIOGRAPHY N/A 06/10/2018   Procedure: LEFT HEART CATH AND CORONARY ANGIOGRAPHY;  Surgeon: Jettie Booze, MD;  Location: [facility];;  90% mLAD --> DES PCI.  pRCA 25%. p-m Cx 40%. Ost OM1 50%, ost OM2 40% with 50% mid.  pLAD 30% & ost D1 75 % (med Rx).  Normal LVEDP.   [name] NEPHRECTOMY    . SHOULDER ARTHROSCOPY Right   . TONSILLECTOMY  AGE 50  . TOTAL KNEE ARTHROPLASTY Left 10-15-2008  . TOTAL KNEE ARTHROPLASTY Right 01/18/2014   Procedure: RIGHT TOTAL KNEE ARTHROPLASTY;  Surgeon: Gearlean Alf, MD;  Location: [facility];  Service: Orthopedics;  Laterality: Right;  . TRANSTHORACIC ECHOCARDIOGRAM  01/2018    Normal LV size with moderate LVH.  EF 60 to 65% with GR 1 DD (normal for age)  . WRIST GANGLION EXCISION Right 1964    Current Meds  Medication Sig  . acetaminophen (TYLENOL) 500 MG tablet Take 1,000 mg by mouth 2 (two) times daily as needed for moderate pain. And prn  . allopurinol (ZYLOPRIM) 300 MG tablet Take 300 mg by mouth daily.  [name] apixaban (ELIQUIS) 5 MG TABS tablet Take 5 mg by mouth 2 (two) times daily.  . clopidogrel (PLAVIX) 75 MG tablet TAKE 1 TABLET (75 MG TOTAL) BY MOUTH DAILY WITH BREAKFAST.  [name] escitalopram (LEXAPRO) 20 MG tablet Take 20 mg by mouth daily.   [name] gabapentin (NEURONTIN) 300 MG  capsule Take 300 mg by mouth 3 (three) times daily.  . Ipratropium-Albuterol (COMBIVENT RESPIMAT) 20-100 MCG/ACT AERS respimat Inhale 1 puff into the lungs every 6 (six) hours as needed for wheezing or shortness of breath.  . methocarbamol (ROBAXIN) 750 MG tablet Take 750 mg by mouth 3 (three) times daily.  . Multiple Vitamin (MULTIVITAMIN WITH MINERALS) TABS tablet Take 1 tablet by mouth daily.  . nitroGLYCERIN (NITROSTAT) 0.4 MG SL tablet Place 1 tablet (0.4 mg total) under the tongue every 5 (five) minutes as needed for chest pain.  . rosuvastatin (CRESTOR) 20 MG tablet Take 1 tablet (20 mg total) by mouth at bedtime.  . Testosterone Cypionate 200 MG/ML KIT Inject 400 mg into the muscle  every 14 (fourteen) days. For IM use only  . valsartan (DIOVAN) 320 MG tablet Take 320 mg by mouth daily.  . [DISCONTINUED] albuterol (PROVENTIL) (2.5 MG/3ML) 0.083% nebulizer solution Take 2.5 mg by nebulization every 6 (six) hours as needed for wheezing or shortness of breath.  . [DISCONTINUED] Omega-3 Fatty Acids (FISH OIL) 1200 MG CAPS Take 1 capsule by mouth daily.    Allergies  Allergen Reactions  . Garlic Anaphylaxis  . Onion Anaphylaxis  . Codeine Other (See Comments)    "PASSES OUT"  . Hydrocodone     Pt states that he is allergic to synthetic hydrocodone.  . Novocain [Procaine] Nausea Only    Social History   Tobacco Use  . Smoking status: Former Smoker    Packs/day: 1.00    Years: 25.00    Pack years: 25.00    Types: Cigarettes    Last attempt to quit: 07/13/1980    Years since quitting: 38.2  . Smokeless tobacco: Never Used  Substance Use Topics  . Alcohol use: Yes    Comment: RARE  . Drug use: No   Social History   Social History Narrative   Lives at home with his wife & youngest daughter and her family   Right handed   Drinks 2 cups of coffee daily, occasional cup of tea    family history includes Colon cancer in his mother; Deep vein thrombosis in his father; Stroke in  his brother and father.  Wt Readings from Last 3 Encounters:  10/08/18 241 lb (109.3 kg)  06/24/18 244 lb 3.2 oz (110.8 kg)  06/10/18 251 lb (113.9 kg)    PHYSICAL EXAM BP 128/68 (BP Location: Left Arm, Patient Position: Sitting, Cuff Size: Large)   Pulse 88   Ht 5' 10.5" (1.791 m)   Wt 241 lb (109.3 kg)   BMI 34.09 kg/m  Physical Exam  Constitutional: He is oriented to person, place, and time. He appears well-developed and well-nourished. No distress.  Healthy-appearing.  Well-groomed.  Moderately obese  HENT:  Head: Normocephalic and atraumatic.  Neck: Normal range of motion. Neck supple. No hepatojugular reflux and no JVD present. Carotid bruit is not present. No thyromegaly present.  Cardiovascular: Normal rate, regular rhythm, normal heart sounds, intact distal pulses and normal pulses.  Occasional extrasystoles are present. PMI is not displaced. Exam reveals no gallop and no friction rub.  No murmur heard. Pulmonary/Chest: Effort normal and breath sounds normal. No respiratory distress. He has no wheezes. He has no rales.  Abdominal: Soft. Bowel sounds are normal. He exhibits no distension. There is no tenderness. There is no rebound.  No HSM  Musculoskeletal: Normal range of motion. He exhibits edema (trivial).  Neurological: He is alert and oriented to person, place, and time.  Skin:  Mild bilateral lower extremity/ankle spider/varicose veins.  No signs of venous stasis dermatitis.   Psychiatric: He has a normal mood and affect. His behavior is normal. Judgment and thought content normal.  Vitals reviewed.   Adult ECG Report Not checked  Other studies Reviewed: Additional studies/ records that were reviewed today include:  Recent Labs:   Lab Results  Component Value Date   CHOL 106 08/19/2018   HDL 39 (L) 08/19/2018   LDLCALC 48 08/19/2018   TRIG 97 08/19/2018   CHOLHDL 2.7 08/19/2018   Lab Results  Component Value Date   CREATININE 1.33 (H) 06/05/2018   BUN  19 06/05/2018   NA 142 06/05/2018   K 5.3 (H) 06/05/2018  CL 101 06/05/2018   CO2 27 06/05/2018    ASSESSMENT / PLAN: Problem List Items Addressed This Visit    Atypical angina (HCC) -Class II-III (Chronic)    Completely resolved.  Now 0 symptoms.      CAD S/P percutaneous coronary angioplasty - Primary (Chronic)    Doing well ever since his PCI.  His main symptom he had was profound exertional dyspnea this been going on for quite some time now.  He finally notes that ever since his stent he has not had any more dyspnea. Continues to be on Plavix plus Eliquis so therefore not on aspirin.  Is on stable dose of statin and ARB.  Not on beta-blocker because of issues with fatigue and bradycardia.  We will continue to monitor to see if he would tolerate in future.  As of February 2020, would be okay to hold Plavix for procedures.  Would need to hold both Plavix and Eliquis.      DOE (dyspnea on exertion) (Chronic)    Clearly this was his anginal equivalent.  Symptoms totally relieved with PCI.      Essential hypertension (Chronic)    Blood pressure stable on ARB.  Continue to monitor.  Low threshold for trying to start a beta-blocker.  I chose not to precath, and was stable blood pressure is now and no symptoms, I do not feel obligated.      Hyperlipidemia with target LDL less than 70 (Chronic)    At this point now with clearly documented CAD and PCI, aggressive control is warranted.  Most recent labs do show that he is well controlled on current dose of statin.  No change.  Would be due for recheck in roughly 6 months.        I spent a total of 63mnutes with the patient and chart review. >  50% of the time was spent in direct patient consultation.   Current medicines are reviewed at length with the patient today.  (+/- concerns) none - just ? When OK to hold Plavix / Eliquis The following changes have been made:  see above -- Eliquis is not a Rx from me (for History of recurrent  DVT/PE) -- would need to clarify with his PCP - but I suspect that holding 24-48 hr pre-procedure would be OK.   Patient Instructions  Medication Instructions:   NO T NEEDED  IF YOU MAY HOLD PLAVIX IF NEEDED  IN FEB 2020 - PLEASE CALL OFFICE FOR INSTRUCTIONS  If you need a refill on your cardiac medications before your next appointment, please call your pharmacy.   Lab work: Not needed If you have labs (blood work) drawn today and your tests are completely normal, you will receive your results only by: .Joseph KitchenMyChart Message (if you have MyChart) OR . A paper copy in the mail If you have any lab test that is abnormal or we need to change your treatment, we will call you to review the results.  Testing/Procedures: Not needed  Follow-Up: [facility] you and your health needs are our priority.  As part of our continuing mission to provide you with exceptional heart care, we have created designated Provider Care Teams.  These Care Teams include your primary Cardiologist (physician) and Advanced Practice Providers (APPs -  Physician Assistants and Nurse Practitioners) who all work together to provide you with the care you need, when you need it. You will need a follow up appointment in 6 months June /July 2020.  Please call our office 2 months in advance to schedule this appointment.  You may see Glenetta Hew, MD  or one of the following Advanced Practice Providers on your designated Care Team:   Rosaria Ferries, PA-C . Jory Sims, DNP, ANP  Any Other Special Instructions Will Be Listed Below (If Applicable).     Studies Ordered:   No orders of the defined types were placed in this encounter.     Glenetta Hew, M.D., M.S. Interventional Cardiologist   Pager # [phone] Phone # [phone] [address] [place], [place] [address]   Thank you for choosing Heartcare at [facility]!!

## 2018-10-10 DIAGNOSIS — M5030 Other cervical disc degeneration, unspecified cervical region: Secondary | ICD-10-CM | POA: Diagnosis not present

## 2018-10-11 ENCOUNTER — Encounter: Payer: Self-pay | Admitting: Cardiology

## 2018-10-11 DIAGNOSIS — I2510 Atherosclerotic heart disease of native coronary artery without angina pectoris: Secondary | ICD-10-CM | POA: Insufficient documentation

## 2018-10-11 DIAGNOSIS — Z9861 Coronary angioplasty status: Secondary | ICD-10-CM

## 2018-10-11 DIAGNOSIS — E785 Hyperlipidemia, unspecified: Secondary | ICD-10-CM | POA: Insufficient documentation

## 2018-10-11 HISTORY — DX: Coronary angioplasty status: Z98.61

## 2018-10-11 HISTORY — DX: Atherosclerotic heart disease of native coronary artery without angina pectoris: I25.10

## 2018-10-11 NOTE — Assessment & Plan Note (Addendum)
Doing well ever since his PCI.  His main symptom he had was profound exertional dyspnea this been going on for quite some time now.  He finally notes that ever since his stent he has not had any more dyspnea. Continues to be on Plavix plus Eliquis so therefore not on aspirin.  Is on stable dose of statin and ARB.  Not on beta-blocker because of issues with fatigue and bradycardia.  We will continue to monitor to see if he would tolerate in future.  As of February 2020, would be okay to hold Plavix for procedures.  Would need to hold both Plavix and Eliquis.

## 2018-10-11 NOTE — Assessment & Plan Note (Signed)
Blood pressure stable on ARB.  Continue to monitor.  Low threshold for trying to start a beta-blocker.  I chose not to precath, and was stable blood pressure is now and no symptoms, I do not feel obligated.

## 2018-10-11 NOTE — Assessment & Plan Note (Signed)
At this point now with clearly documented CAD and PCI, aggressive control is warranted.  Most recent labs do show that he is well controlled on current dose of statin.  No change.  Would be due for recheck in roughly 6 months.

## 2018-10-11 NOTE — Assessment & Plan Note (Signed)
Clearly this was his anginal equivalent.  Symptoms totally relieved with PCI.

## 2018-10-11 NOTE — Assessment & Plan Note (Signed)
Completely resolved.  Now 0 symptoms.

## 2018-10-21 DIAGNOSIS — M5030 Other cervical disc degeneration, unspecified cervical region: Secondary | ICD-10-CM | POA: Diagnosis not present

## 2018-10-23 DIAGNOSIS — M25812 Other specified joint disorders, left shoulder: Secondary | ICD-10-CM | POA: Diagnosis not present

## 2018-10-23 DIAGNOSIS — M7542 Impingement syndrome of left shoulder: Secondary | ICD-10-CM | POA: Diagnosis not present

## 2018-10-23 DIAGNOSIS — M25811 Other specified joint disorders, right shoulder: Secondary | ICD-10-CM | POA: Diagnosis not present

## 2018-11-06 MED FILL — CLOPIDOGREL 75 MG TABLET: 75 | 30 days supply | Qty: 30 | Fill #3

## 2018-12-05 ENCOUNTER — Other Ambulatory Visit: Payer: Self-pay | Admitting: Cardiology

## 2018-12-05 MED FILL — CLOPIDOGREL 75 MG TABLET: 75 | 90 days supply | Qty: 90 | Fill #0

## 2018-12-05 NOTE — Telephone Encounter (Signed)
Rx(s) sent to pharmacy electronically.  

## 2018-12-11 DIAGNOSIS — E291 Testicular hypofunction: Secondary | ICD-10-CM | POA: Diagnosis not present

## 2018-12-11 DIAGNOSIS — D751 Secondary polycythemia: Secondary | ICD-10-CM | POA: Diagnosis not present

## 2018-12-11 DIAGNOSIS — R3915 Urgency of urination: Secondary | ICD-10-CM | POA: Diagnosis not present

## 2018-12-11 DIAGNOSIS — N401 Enlarged prostate with lower urinary tract symptoms: Secondary | ICD-10-CM | POA: Diagnosis not present

## 2018-12-31 DIAGNOSIS — C44619 Basal cell carcinoma of skin of left upper limb, including shoulder: Secondary | ICD-10-CM | POA: Diagnosis not present

## 2018-12-31 DIAGNOSIS — D485 Neoplasm of uncertain behavior of skin: Secondary | ICD-10-CM | POA: Diagnosis not present

## 2018-12-31 DIAGNOSIS — D2271 Melanocytic nevi of right lower limb, including hip: Secondary | ICD-10-CM | POA: Diagnosis not present

## 2018-12-31 DIAGNOSIS — L570 Actinic keratosis: Secondary | ICD-10-CM | POA: Diagnosis not present

## 2018-12-31 DIAGNOSIS — Z85828 Personal history of other malignant neoplasm of skin: Secondary | ICD-10-CM | POA: Diagnosis not present

## 2019-01-21 DIAGNOSIS — M25811 Other specified joint disorders, right shoulder: Secondary | ICD-10-CM | POA: Diagnosis not present

## 2019-03-06 MED FILL — CLOPIDOGREL 75 MG TABLET: 75 | 90 days supply | Qty: 90 | Fill #1

## 2019-03-18 DIAGNOSIS — I129 Hypertensive chronic kidney disease with stage 1 through stage 4 chronic kidney disease, or unspecified chronic kidney disease: Secondary | ICD-10-CM | POA: Diagnosis not present

## 2019-03-18 DIAGNOSIS — N183 Chronic kidney disease, stage 3 (moderate): Secondary | ICD-10-CM | POA: Diagnosis not present

## 2019-03-18 DIAGNOSIS — G4733 Obstructive sleep apnea (adult) (pediatric): Secondary | ICD-10-CM | POA: Diagnosis not present

## 2019-03-18 DIAGNOSIS — I2510 Atherosclerotic heart disease of native coronary artery without angina pectoris: Secondary | ICD-10-CM | POA: Diagnosis not present

## 2019-03-19 DIAGNOSIS — N183 Chronic kidney disease, stage 3 (moderate): Secondary | ICD-10-CM | POA: Diagnosis not present

## 2019-03-19 DIAGNOSIS — M109 Gout, unspecified: Secondary | ICD-10-CM | POA: Diagnosis not present

## 2019-04-01 DIAGNOSIS — C44619 Basal cell carcinoma of skin of left upper limb, including shoulder: Secondary | ICD-10-CM | POA: Diagnosis not present

## 2019-04-01 DIAGNOSIS — L820 Inflamed seborrheic keratosis: Secondary | ICD-10-CM | POA: Diagnosis not present

## 2019-04-01 DIAGNOSIS — D485 Neoplasm of uncertain behavior of skin: Secondary | ICD-10-CM | POA: Diagnosis not present

## 2019-06-01 DIAGNOSIS — M25811 Other specified joint disorders, right shoulder: Secondary | ICD-10-CM | POA: Diagnosis not present

## 2019-06-01 DIAGNOSIS — M25812 Other specified joint disorders, left shoulder: Secondary | ICD-10-CM | POA: Diagnosis not present

## 2019-06-01 MED FILL — CLOPIDOGREL 75 MG TABLET: 75 | 90 days supply | Qty: 90 | Fill #2

## 2019-06-04 ENCOUNTER — Other Ambulatory Visit: Payer: Self-pay | Admitting: Cardiology

## 2019-06-25 ENCOUNTER — Ambulatory Visit: Payer: Medicare Other | Admitting: Cardiology

## 2019-06-25 ENCOUNTER — Other Ambulatory Visit: Payer: Self-pay

## 2019-06-25 DIAGNOSIS — Z86711 Personal history of pulmonary embolism: Secondary | ICD-10-CM | POA: Insufficient documentation

## 2019-06-25 NOTE — Progress Notes (Deleted)
PCP: Joseph Stains, MD  Clinic Note: No chief complaint on file.   HPI: [name] is a 78 y.o. male with a PMH notable for CAD status post PCI (anginal equivalent was exertional dyspnea) as well as DVT-PE on Eliquis, prior CVA, and OSA who presents here for delayed six-month follow-up.   [name] was last seen on October 08, 2018 --he was doing very well at that time.  No longer having shortness of breath with exertion.  Had lost about 20 pounds.  Was doing more exercise.  Denied any chest tightness or pressure.  No bleeding issues.   --Plan was to continue Plavix and Eliquis but okay to hold Plavix for procedures as of February 2020.  Recent Hospitalizations: None since last visit.  Studies Personally Reviewed - (if available, images/films reviewed: From Epic Chart or Care Everywhere)  None  Interval History: [name]    ROS: A comprehensive was performed. Review of Systems  Constitutional: Negative for malaise/fatigue.  HENT: Negative for congestion.   Respiratory: Negative for cough and shortness of breath.   Cardiovascular: Negative for leg swelling.  Gastrointestinal: Negative for abdominal pain and heartburn.  Musculoskeletal: Negative for joint pain and myalgias.  Neurological: Positive for dizziness (see HPI). Negative for weakness and headaches.  Endo/Heme/Allergies: Does not bruise/bleed easily.  Psychiatric/Behavioral: The patient is not nervous/anxious and does not have insomnia.   All other systems reviewed and are negative.   I have reviewed and (if needed) personally updated the patient's problem list, medications, allergies, past medical and surgical history, social and family history.   Past Medical History:  Diagnosis Date  . Anticoagulated on warfarin   . Anxiety   . Arthritis    KNEES, NECK, SHOULDERS, HANDS  . Asthma   . Biceps tendon tear    right  . Cancer ([place])    skin cancer-  2 basal cell, 1 squamous cell  . GERD  (gastroesophageal reflux disease)   . Gout, arthritis    BILATERAL GREAT TOE-- PER PT STABLE  . H/O transfusion of packed red blood cells    as a child/ s/p tonsillectomy  . History of pulmonary embolism   . Lung nodule    BENIGN RIGHT UPPER--  CHEST CT 08-22-2011  . OSA (obstructive sleep apnea)    NO CPAP -states dr said possibly related to asthma  . S/P dilatation of esophageal stricture   . Stroke ([place])   . Torn rotator cuff    Right x 3    Past Surgical History:  Procedure Laterality Date  . CARPAL TUNNEL RELEASE Right 2010  . CORONARY CALCIUM SCORING WITH CT CORONARY ANGIOGRAM  05/29/2018   Coronary calcium score 416.  Possible obstructive CAD in the mid circumflex-OM1-OM 2, D2 and a moderate disease in the proximal LAD -->  FFR --> LAD: Proximal.90, mid.86 distal.68 D2.80, Cx: Mid.76 OM2.74: Hemodynamically significant distal LAD, D2, mid circumflex and OM2 lesions  . CORONARY STENT INTERVENTION N/A 06/10/2018   Procedure: CORONARY STENT INTERVENTION;  Surgeon: Jettie Booze, MD;  Location: [place] INVASIVE CV LAB;;  90% mLAD --> DES PCI (ORSIRO 2.75X18) => 0%  . JOINT REPLACEMENT    . LEFT HEART CATH AND CORONARY ANGIOGRAPHY N/A 06/10/2018   Procedure: LEFT HEART CATH AND CORONARY ANGIOGRAPHY;  Surgeon: Jettie Booze, MD;  Location: [facility];;  90% mLAD --> DES PCI.  pRCA 25%. p-m Cx 40%. Ost OM1 50%, ost OM2 40% with 50% mid.  pLAD 30% &  ost D1 75 % (med Rx).  Normal LVEDP.   [name] NEPHRECTOMY    . SHOULDER ARTHROSCOPY Right   . TONSILLECTOMY  AGE 54  . TOTAL KNEE ARTHROPLASTY Left 10-15-2008  . TOTAL KNEE ARTHROPLASTY Right 01/18/2014   Procedure: RIGHT TOTAL KNEE ARTHROPLASTY;  Surgeon: Gearlean Alf, MD;  Location: [facility];  Service: Orthopedics;  Laterality: Right;  . TRANSTHORACIC ECHOCARDIOGRAM  01/2018    Normal LV size with moderate LVH.  EF 60 to 65% with GR 1 DD (normal for age)  . WRIST GANGLION EXCISION Right 1964    Cardiac Cath-PCI 06/10/2018: 90%  mLAD --> DES PCI (ORSIRO 2.75X18) => 0%.  pRCA 25%. p-m Cx 40%. Ost OM1 50%, ost OM2 40% with 50% mid.  pLAD 30% & ost D1 75 % (med Rx).  Normal LVEDP.  Diagnostic       Intervention  Orsiro DES 2.75 x 18       Recommend to resume Apixaban, at currently prescribed dose and frequency, on 06/11/18. Recommend concurrent antiplatelet therapy of Aspirin 81mg  daily for 1 month and Clopidogrel 75mg  daily for 6 months  No outpatient medications have been marked as taking for the 06/25/19 encounter (Appointment) with Leonie Man, MD.    Allergies  Allergen Reactions  . Garlic Anaphylaxis  . Onion Anaphylaxis  . Codeine Other (See Comments)    "PASSES OUT"  . Hydrocodone     Pt states that he is allergic to synthetic hydrocodone.  . Novocain [Procaine] Nausea Only    Social History   Tobacco Use  . Smoking status: Former Smoker    Packs/day: 1.00    Years: 25.00    Pack years: 25.00    Types: Cigarettes    Quit date: 07/13/1980    Years since quitting: 38.9  . Smokeless tobacco: Never Used  Substance Use Topics  . Alcohol use: Yes    Comment: RARE  . Drug use: No   Social History   Social History Narrative   Lives at home with his wife & youngest daughter and her family   Right handed   Drinks 2 cups of coffee daily, occasional cup of tea    family history includes Colon cancer in his mother; Deep vein thrombosis in his father; Stroke in his brother and father.  Wt Readings from Last 3 Encounters:  10/08/18 241 lb (109.3 kg)  06/24/18 244 lb 3.2 oz (110.8 kg)  06/10/18 251 lb (113.9 kg)    PHYSICAL EXAM There were no vitals taken for this visit. Physical Exam  Constitutional: He is oriented to person, place, and time. He appears well-developed and well-nourished. No distress.  Healthy-appearing.  Well-groomed.  Moderately obese  HENT:  Head: Normocephalic and atraumatic.  Neck: Normal range of motion. Neck supple. No hepatojugular reflux and no JVD present. Carotid  bruit is not present. No thyromegaly present.  Cardiovascular: Normal rate, regular rhythm, normal heart sounds, intact distal pulses and normal pulses.  Occasional extrasystoles are present. PMI is not displaced. Exam reveals no gallop and no friction rub.  No murmur heard. Pulmonary/Chest: Effort normal and breath sounds normal. No respiratory distress. He has no wheezes. He has no rales.  Abdominal: Soft. Bowel sounds are normal. He exhibits no distension. There is no abdominal tenderness. There is no rebound.  No HSM  Musculoskeletal: Normal range of motion.        General: Edema (trivial) present.  Neurological: He is alert and oriented to person, place, and time.  Skin:  Mild bilateral lower extremity/ankle spider/varicose veins.  No signs of venous stasis dermatitis.   Psychiatric: He has a normal mood and affect. His behavior is normal. Judgment and thought content normal.  Vitals reviewed.   Adult ECG Report Not checked  Other studies Reviewed: Additional studies/ records that were reviewed today include:  Recent Labs:   Lab Results  Component Value Date   CHOL 106 08/19/2018   HDL 39 (L) 08/19/2018   LDLCALC 48 08/19/2018   TRIG 97 08/19/2018   CHOLHDL 2.7 08/19/2018   Lab Results  Component Value Date   CREATININE 1.33 (H) 06/05/2018   BUN 19 06/05/2018   NA 142 06/05/2018   K 5.3 (H) 06/05/2018   CL 101 06/05/2018   CO2 27 06/05/2018    ASSESSMENT / PLAN: Problem List Items Addressed This Visit    Hyperlipidemia with target LDL less than 70 - Primary (Chronic)   History of pulmonary embolism   Essential hypertension (Chronic)   DOE (dyspnea on exertion) (Chronic)   CAD S/P percutaneous coronary angioplasty (Chronic)     I spent a total of 18minutes with the patient and chart review. >  50% of the time was spent in direct patient consultation.   Current medicines are reviewed at length with the patient today.  (+/- concerns) none - just ? When OK to hold  Plavix / Eliquis The following changes have been made:  see above -- Eliquis is not a Rx from me (for History of recurrent DVT/PE) -- would need to clarify with his PCP - but I suspect that holding 24-48 hr pre-procedure would be OK.   There are no Patient Instructions on file for this visit. Studies Ordered:   No orders of the defined types were placed in this encounter.     Glenetta Hew, M.D., M.S. Interventional Cardiologist   Pager # [phone] Phone # [phone] [address] [place], [place] [address]   Thank you for choosing Heartcare at [facility]!!

## 2019-07-01 DIAGNOSIS — H2513 Age-related nuclear cataract, bilateral: Secondary | ICD-10-CM | POA: Diagnosis not present

## 2019-07-07 DIAGNOSIS — E291 Testicular hypofunction: Secondary | ICD-10-CM | POA: Diagnosis not present

## 2019-07-07 DIAGNOSIS — R948 Abnormal results of function studies of other organs and systems: Secondary | ICD-10-CM | POA: Diagnosis not present

## 2019-07-14 DIAGNOSIS — R3915 Urgency of urination: Secondary | ICD-10-CM | POA: Diagnosis not present

## 2019-07-14 DIAGNOSIS — N401 Enlarged prostate with lower urinary tract symptoms: Secondary | ICD-10-CM | POA: Diagnosis not present

## 2019-07-14 DIAGNOSIS — E291 Testicular hypofunction: Secondary | ICD-10-CM | POA: Diagnosis not present

## 2019-08-05 ENCOUNTER — Other Ambulatory Visit: Payer: Self-pay

## 2019-08-05 ENCOUNTER — Ambulatory Visit (INDEPENDENT_AMBULATORY_CARE_PROVIDER_SITE_OTHER): Payer: Medicare Other | Admitting: Cardiology

## 2019-08-05 ENCOUNTER — Encounter: Payer: Self-pay | Admitting: Cardiology

## 2019-08-05 VITALS — BP 124/69 | HR 78 | Temp 97.2°F | Ht 70.5 in | Wt 240.2 lb

## 2019-08-05 DIAGNOSIS — I208 Other forms of angina pectoris: Secondary | ICD-10-CM

## 2019-08-05 DIAGNOSIS — Z86711 Personal history of pulmonary embolism: Secondary | ICD-10-CM | POA: Diagnosis not present

## 2019-08-05 DIAGNOSIS — R0609 Other forms of dyspnea: Secondary | ICD-10-CM

## 2019-08-05 DIAGNOSIS — I2510 Atherosclerotic heart disease of native coronary artery without angina pectoris: Secondary | ICD-10-CM | POA: Diagnosis not present

## 2019-08-05 DIAGNOSIS — I10 Essential (primary) hypertension: Secondary | ICD-10-CM

## 2019-08-05 DIAGNOSIS — Z9861 Coronary angioplasty status: Secondary | ICD-10-CM

## 2019-08-05 DIAGNOSIS — E785 Hyperlipidemia, unspecified: Secondary | ICD-10-CM

## 2019-08-05 DIAGNOSIS — R0600 Dyspnea, unspecified: Secondary | ICD-10-CM

## 2019-08-05 NOTE — Patient Instructions (Addendum)
Medication Instructions:   No changes '  If you need a refill on your cardiac medications before your next appointment, please call your pharmacy.   Lab work:  Not needed   Testing/Procedures:  NOT NEEDED  Follow-Up: At [facility], you and your health needs are our priority.  As part of our continuing mission to provide you with exceptional heart care, we have created designated Provider Care Teams.  These Care Teams include your primary Cardiologist (physician) and Advanced Practice Providers (APPs -  Physician Assistants and Nurse Practitioners) who all work together to provide you with the care you need, when you need it. . You will need a follow up appointment in 12  Months-SEPT 2021 .  Please call our office 2 months in advance to schedule this appointment.  You may see Glenetta Hew, MD or one of the following Advanced Practice Providers on your designated Care Team:   . Rosaria Ferries, PA-C . Jory Sims, DNP, ANP- Your physician recommends that you schedule a follow-up appointment in: 4 TO 6 MONTH . [name]  .   Any Other Special Instructions Will Be Listed Below (If Applicable).

## 2019-08-05 NOTE — Progress Notes (Signed)
PCP: Harlan Stains, MD  Clinic Note: Chief Complaint  Patient presents with  . Follow-up    Exertional shortness of breath  . Coronary Artery Disease    HPI: [name] is a 78 y.o. male with a PMH notable for CAD-PCI below who presents today for second post cath-PCI follow-up.  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  [name] was initially seen in July 2019 for what seemed to be longstanding exertional dyspnea but no real (chest pain/pressure) angina.  Based on his risk factors we evaluated with a coronary CT angiogram that showed significant disease likely in the LAD.  He was referred for cardiac cath August 2019 -> DES PCI of mid LAD with mild-moderate disease elsewhere.  I last saw him in December 2019 for second post-cath follow-up.  He was doing relatively well no more significant dyspnea since his PCI.  He was doing well with combination of Plavix plus Eliquis.  (Not on beta-blocker because of fatigue and bradycardia)  Recent Hospitalizations: None since Cath  Studies Personally Reviewed - (if available, images/films reviewed: From Epic Chart or Care Everywhere)  None since his cardiac catheter  Interval History: [name] returns today overall doing well for the most part as far as any angina symptoms.  He indicates that prior to COVID-19, he was doing a whole lot better and he lost a lot of weight.  He was much more active exercising, but now notes that he is more dyspneic with exertion because he has not been doing much exercising since the COVID-19 lockdown.  He says that he gets SOB walking more briskly or up hills, but not on flat ground.  He notes it is really hard for him to keep up with his kids these days, especially with him being deconditioned.  He had gotten his weight all the way down to 222 pounds prior to COVID and is gained back to just under 240 pounds according to his home scale).  Cardiovascular  review of symptoms: positive for - dyspnea on exertion and Really just over the last few months since he is been less active negative for - chest pain, edema, irregular heartbeat, orthopnea, palpitations, paroxysmal nocturnal dyspnea, rapid heart rate, shortness of breath or Syncope/near syncope, TIA shows amaurosis fugax; claudication   ROS: A comprehensive was performed. Review of Systems  Constitutional: Negative for malaise/fatigue and weight loss (Has gained a lot of weight back after losing some).  HENT: Negative for congestion.   Respiratory: Negative for cough and shortness of breath.   Cardiovascular: Negative for leg swelling.  Gastrointestinal: Negative for abdominal pain and heartburn.  Genitourinary: Negative for dysuria and frequency.       He does have some nocturia  Musculoskeletal: Negative for joint pain and myalgias.  Skin: Negative.   Neurological: Negative for dizziness (Not some much as he used to be.), weakness and headaches.  Endo/Heme/Allergies: Does not bruise/bleed easily.  Psychiatric/Behavioral: The patient is not nervous/anxious and does not have insomnia.   All other systems reviewed and are negative.   I have reviewed and (if needed) personally updated the patient's problem list, medications, allergies, past medical and surgical history, social and family history.   Past Medical History:  Diagnosis Date  . Anxiety   . Arthritis    KNEES, NECK, SHOULDERS, HANDS  . Asthma   . Biceps tendon tear    right  . Cancer ([place])  skin cancer-  2 basal cell, 1 squamous cell  . GERD (gastroesophageal reflux disease)   . Gout, arthritis    BILATERAL GREAT TOE-- PER PT STABLE  . H/O transfusion of packed red blood cells    as a child/ s/p tonsillectomy  . History of pulmonary embolism    Maintained on lifelong anticoagulation with Eliquis.  . Lung nodule    BENIGN RIGHT UPPER--  CHEST CT 08-22-2011  . OSA (obstructive sleep apnea)    NO CPAP -states dr  said possibly related to asthma  . S/P dilatation of esophageal stricture   . Stroke ([place])   . Torn rotator cuff    Right x 3    Past Surgical History:  Procedure Laterality Date  . CARPAL TUNNEL RELEASE Right 2010  . CORONARY CALCIUM SCORING WITH CT CORONARY ANGIOGRAM  05/29/2018   Coronary calcium score 416.  Possible obstructive CAD in the mid circumflex-OM1-OM 2, D2 and a moderate disease in the proximal LAD -->  FFR --> LAD: Proximal.90, mid.86 distal.68 D2.80, Cx: Mid.76 OM2.74: Hemodynamically significant distal LAD, D2, mid circumflex and OM2 lesions  . CORONARY STENT INTERVENTION N/A 06/10/2018   Procedure: CORONARY STENT INTERVENTION;  Surgeon: Jettie Booze, MD;  Location: [facility];;  90% mLAD --> DES PCI (ORSIRO 2.75X18) => 0%  . JOINT REPLACEMENT    . LEFT HEART CATH AND CORONARY ANGIOGRAPHY N/A 06/10/2018   Procedure: LEFT HEART CATH AND CORONARY ANGIOGRAPHY;  Surgeon: Jettie Booze, MD;  Location: [facility];;  90% mLAD --> DES PCI.  pRCA 25%. p-m Cx 40%. Ost OM1 50%, ost OM2 40% with 50% mid.  pLAD 30% & ost D1 75 % (med Rx).  Normal LVEDP.   [name] NEPHRECTOMY    . SHOULDER ARTHROSCOPY Right   . TONSILLECTOMY  AGE 20  . TOTAL KNEE ARTHROPLASTY Left 10-15-2008  . TOTAL KNEE ARTHROPLASTY Right 01/18/2014   Procedure: RIGHT TOTAL KNEE ARTHROPLASTY;  Surgeon: Gearlean Alf, MD;  Location: [facility];  Service: Orthopedics;  Laterality: Right;  . TRANSTHORACIC ECHOCARDIOGRAM  01/2018    Normal LV size with moderate LVH.  EF 60 to 65% with GR 1 DD (normal for age)  . WRIST GANGLION EXCISION Right 1964    Cardiac Cath-PCI 06/10/2018: 90% mLAD --> DES PCI (ORSIRO 2.75X18) => 0%.  pRCA 25%. p-m Cx 40%. Ost OM1 50%, ost OM2 40% with 50% mid.  pLAD 30% & ost D1 75 % (med Rx).  Normal LVEDP. Intervention  Orsiro DES 2.75 x 18        Current Meds  Medication Sig  . acetaminophen (TYLENOL) 500 MG tablet Take 1,000 mg by mouth 2 (two) times daily as needed for  moderate pain. And prn  . allopurinol (ZYLOPRIM) 300 MG tablet Take 300 mg by mouth daily.  [name] apixaban (ELIQUIS) 5 MG TABS tablet Take 5 mg by mouth 2 (two) times daily.  . clopidogrel (PLAVIX) 75 MG tablet Take 1 tablet (75 mg total) by mouth daily with breakfast.  . escitalopram (LEXAPRO) 20 MG tablet Take 20 mg by mouth daily.   [name] gabapentin (NEURONTIN) 300 MG capsule Take 300 mg by mouth 3 (three) times daily.  . methocarbamol (ROBAXIN) 750 MG tablet Take 750 mg by mouth 3 (three) times daily.  . Multiple Vitamin (MULTIVITAMIN WITH MINERALS) TABS tablet Take 1 tablet by mouth daily.  . nitroGLYCERIN (NITROSTAT) 0.4 MG SL tablet Place 1 tablet (0.4 mg total) under the tongue every  5 (five) minutes as needed for chest pain.  . rosuvastatin (CRESTOR) 20 MG tablet TAKE 1 TABLET BY MOUTH EVERY NIGHT AT BEDTIME  . Testosterone Cypionate 200 MG/ML KIT Inject 400 mg into the muscle every 14 (fourteen) days. For IM use only  . valsartan (DIOVAN) 320 MG tablet Take 320 mg by mouth daily.    Allergies  Allergen Reactions  . Garlic Anaphylaxis  . Onion Anaphylaxis  . Codeine Other (See Comments)    "PASSES OUT"  . Hydrocodone     Pt states that he is allergic to synthetic hydrocodone.  . Novocain [Procaine] Nausea Only    Social History   Tobacco Use  . Smoking status: Former Smoker    Packs/day: 1.00    Years: 25.00    Pack years: 25.00    Types: Cigarettes    Quit date: 07/13/1980    Years since quitting: 39.0  . Smokeless tobacco: Never Used  Substance Use Topics  . Alcohol use: Yes    Comment: RARE  . Drug use: No   Social History   Social History Narrative   Lives at home with his wife & youngest daughter and her family   Right handed   Drinks 2 cups of coffee daily, occasional cup of tea    family history includes Colon cancer in his mother; Deep vein thrombosis in his father; Stroke in his brother and father.  Wt Readings from Last 3 Encounters:  08/05/19 240 lb 3.2  oz (109 kg)  10/08/18 241 lb (109.3 kg)  06/24/18 244 lb 3.2 oz (110.8 kg)    PHYSICAL EXAM BP 124/69   Pulse 78   Temp (!) 97.2 F (36.2 C)   Ht 5' 10.5" (1.791 m)   Wt 240 lb 3.2 oz (109 kg)   SpO2 99%   BMI 33.98 kg/m  Physical Exam  Constitutional: He is oriented to person, place, and time. He appears well-developed and well-nourished. No distress.  Healthy-appearing.  Well-groomed.  Mild to moderately obese  HENT:  Head: Normocephalic and atraumatic.  Neck: Normal range of motion. Neck supple. No hepatojugular reflux and no JVD present. Carotid bruit is not present. No thyromegaly present.  Cardiovascular: Normal rate, regular rhythm, normal heart sounds, intact distal pulses and normal pulses.  Occasional extrasystoles are present. PMI is not displaced. Exam reveals no gallop and no friction rub.  No murmur heard. Pulmonary/Chest: Effort normal and breath sounds normal. No respiratory distress. He has no wheezes. He has no rales.  Abdominal: Soft. Bowel sounds are normal. He exhibits no distension. There is no abdominal tenderness. There is no rebound.  No HSM  Musculoskeletal: Normal range of motion.        General: No edema (trivial).  Neurological: He is alert and oriented to person, place, and time.  Skin:  Mild bilateral lower extremity/ankle spider/varicose veins.  No signs of venous stasis dermatitis.   Psychiatric: He has a normal mood and affect. His behavior is normal. Judgment and thought content normal.  Vitals reviewed.   Adult ECG Report Sinus rhythm, rate 74 bpm.  Cannot exclude inferior infarct, age determined.  Otherwise stable EKG.  Other studies Reviewed: Additional studies/ records that were reviewed today include:  Recent Labs: Due to be checked by PCP next month Lab Results  Component Value Date   CHOL 106 08/19/2018   HDL 39 (L) 08/19/2018   LDLCALC 48 08/19/2018   TRIG 97 08/19/2018   CHOLHDL 2.7 08/19/2018   Lab Results  Component Value  Date   CREATININE 1.33 (H) 06/05/2018   BUN 19 06/05/2018   NA 142 06/05/2018   K 5.3 (H) 06/05/2018   CL 101 06/05/2018   CO2 27 06/05/2018    ASSESSMENT / PLAN: Problem List Items Addressed This Visit    Atypical angina (HCC) -Class II-III (Chronic)    Mostly was exertional dyspnea not angina.  Was totally asymptomatic after his PCI, now noticing some more exertional dyspnea. We will follow this closely, but I think is probably more related to deconditioning.  I will have him see 1 of my advanced practitioners in 4 to 6 months to reassess once he has had a chance to get back out exercising.  Low threshold to consider weight reevaluation with stress test      DOE (dyspnea on exertion) (Chronic)    Anginal equivalent was exertional dyspnea.  This seems to have resolved initially after his PCI, now he said starting no limit more.  I think is probably deconditioning, but we will need to follow-up closely if symptoms worsen.  Now that he has a stent, we would potentially consider a Myoview stress test since we know his anatomy.      Relevant Orders   EKG 12-Lead (Completed)   Hyperlipidemia with target LDL less than 70 (Chronic)   Essential hypertension (Chronic)    Blood pressure looks pretty good on ARB alone.  We will hold off on any further evaluation at this time      Relevant Orders   EKG 12-Lead (Completed)   CAD S/P percutaneous coronary angioplasty - Primary (Chronic)    Mild to moderate disease elsewhere with mid to distal LAD PCI.  He never really did have true anginal chest pain, but does have exertional dyspnea.  Having some exertional dyspnea now but likely related to deconditioning.  Plan: Continue maintenance dose Plavix, however it can be held for procedures if necessary. ->  Would also need to hold Eliquis.  Not on beta-blocker because of history of fatigue and bradycardia.  On statin and ARB.      Relevant Orders   EKG 12-Lead (Completed)   History of  pulmonary embolism (Chronic)    Remains on Eliquis now for maintenance.  Has been far enough out for him to get a hold this as well for procedures if necessary.  Eliquis will need to be held 48 to 72 hours pre-procedure depending on the type of procedure.        I spent a total of 5mnutes with the patient and chart review. >  50% of the time was spent in direct patient consultation.   Current medicines are reviewed at length with the patient today.  (+/- concerns) none - just ? When OK to hold Plavix / Eliquis The following changes have been made:  Both could be held for procedures now.  Patient Instructions  Medication Instructions:   No changes '  If you need a refill on your cardiac medications before your next appointment, please call your pharmacy.   Lab work:  Not needed   Testing/Procedures:  NOT NEEDED  Follow-Up: At [facility] you and your health needs are our priority.  As part of our continuing mission to provide you with exceptional heart care, we have created designated Provider Care Teams.  These Care Teams include your primary Cardiologist (physician) and Advanced Practice Providers (APPs -  Physician Assistants and Nurse Practitioners) who all work together to provide you with the care you need,  when you need it. . You will need a follow up appointment in 12  Months-SEPT 2021 .  Please call our office 2 months in advance to schedule this appointment.  You may see Glenetta Hew, MD or one of the following Advanced Practice Providers on your designated Care Team:   . Rosaria Ferries, PA-C . Jory Sims, DNP, ANP- Your physician recommends that you schedule a follow-up appointment in: 4 TO 6 MONTH . [name]  .   Any Other Special Instructions Will Be Listed Below (If Applicable).  Studies Ordered:   Orders Placed This Encounter  Procedures  . EKG 12-Lead      Glenetta Hew, M.D., M.S. Interventional Cardiologist   Pager # [phone] Phone #  [phone] [address] [place], [place] [address]   Thank you for choosing Heartcare at [facility]!!

## 2019-08-08 ENCOUNTER — Encounter: Payer: Self-pay | Admitting: Cardiology

## 2019-08-08 NOTE — Assessment & Plan Note (Signed)
Anginal equivalent was exertional dyspnea.  This seems to have resolved initially after his PCI, now he said starting no limit more.  I think is probably deconditioning, but we will need to follow-up closely if symptoms worsen.  Now that he has a stent, we would potentially consider a Myoview stress test since we know his anatomy.

## 2019-08-08 NOTE — Assessment & Plan Note (Signed)
Remains on Eliquis now for maintenance.  Has been far enough out for him to get a hold this as well for procedures if necessary.  Eliquis will need to be held 48 to 72 hours pre-procedure depending on the type of procedure.

## 2019-08-08 NOTE — Assessment & Plan Note (Signed)
Mostly was exertional dyspnea not angina.  Was totally asymptomatic after his PCI, now noticing some more exertional dyspnea. We will follow this closely, but I think is probably more related to deconditioning.  I will have him see 1 of my advanced practitioners in 4 to 6 months to reassess once he has had a chance to get back out exercising.  Low threshold to consider weight reevaluation with stress test

## 2019-08-08 NOTE — Assessment & Plan Note (Signed)
Mild to moderate disease elsewhere with mid to distal LAD PCI.  He never really did have true anginal chest pain, but does have exertional dyspnea.  Having some exertional dyspnea now but likely related to deconditioning.  Plan: Continue maintenance dose Plavix, however it can be held for procedures if necessary. ->  Would also need to hold Eliquis.  Not on beta-blocker because of history of fatigue and bradycardia.  On statin and ARB.

## 2019-08-08 NOTE — Assessment & Plan Note (Signed)
Blood pressure looks pretty good on ARB alone.  We will hold off on any further evaluation at this time

## 2019-08-13 DIAGNOSIS — M5412 Radiculopathy, cervical region: Secondary | ICD-10-CM | POA: Diagnosis not present

## 2019-08-18 ENCOUNTER — Telehealth: Payer: Self-pay

## 2019-08-18 NOTE — Telephone Encounter (Signed)
   Primary Cardiologist: Glenetta Hew, MD  Chart reviewed as part of pre-operative protocol coverage. Given past medical history and time since last visit, based on ACC/AHA guidelines, [name] would be at acceptable risk for the planned procedure without further cardiovascular testing.   Per Dr. Allison Quarry note from 08/05/2019, patient okay to hold Eliquis for 3 days prior to spinal procedure. Patient takes Eliquis for recurrent DVT and PE and also carries a diagnosis of stroke.  Recommendations reviewed by pharmacy team  I will route this recommendation to the requesting party via Epic fax function and remove from pre-op pool.  Please call with questions.  Kathyrn Drown, NP 08/18/2019, 12:45 PM

## 2019-08-18 NOTE — Telephone Encounter (Signed)
Pt takes Eliquis for DVT and recurrent PE (first in 1981), none within past 5 years, also has history of stroke. CrCl is 49mL/min. Per Dr Allison Quarry most recent office visit note from 08/05/19, pt ok to hold Eliquis for 3 days prior to spinal procedure.  History of pulmonary embolism (Chronic)     Remains on Eliquis now for maintenance.  Has been far enough out for him to get a hold this as well for procedures if necessary.  Eliquis will need to be held 48 to 72 hours pre-procedure depending on the type of procedure.      Current medicines are reviewed at length with the patient today. (+/- concerns) none - just ? When OK to hold Plavix / Eliquis The following changes have been made: Both could be held for procedures now.

## 2019-08-18 NOTE — Telephone Encounter (Signed)
   [facility] Pre-operative Risk Assessment    Request for surgical clearance:  1. What type of surgery is being performed? CERVICAL ESI  2. When is this surgery scheduled? 09-01-2019   3. What type of clearance is required (medical clearance vs. Pharmacy clearance to hold med vs. Both)? MEDICATION  4. Are there any medications that need to be held prior to surgery and how long? ELIQUIS 3 DAYS PRIOR AND PLAVIX 7 DAYS PRIOR   5. Practice name and name of physician performing surgery? [facility] ATTN:SABRINA   6. What is your office phone number  [phone]    7.   What is your office fax number  [phone]  8.   Anesthesia type (None, local, MAC, general) ? NOT LISTED

## 2019-08-24 DIAGNOSIS — E291 Testicular hypofunction: Secondary | ICD-10-CM | POA: Diagnosis not present

## 2019-09-01 DIAGNOSIS — M5030 Other cervical disc degeneration, unspecified cervical region: Secondary | ICD-10-CM | POA: Diagnosis not present

## 2019-09-01 MED FILL — CLOPIDOGREL 75 MG TABLET: 75 | 90 days supply | Qty: 90 | Fill #3

## 2019-09-22 ENCOUNTER — Other Ambulatory Visit: Payer: Self-pay

## 2019-09-22 DIAGNOSIS — Z20822 Contact with and (suspected) exposure to covid-19: Secondary | ICD-10-CM

## 2019-09-23 DIAGNOSIS — M25811 Other specified joint disorders, right shoulder: Secondary | ICD-10-CM | POA: Diagnosis not present

## 2019-09-23 LAB — NOVEL CORONAVIRUS, NAA: SARS-CoV-2, NAA: NOT DETECTED

## 2019-09-28 DIAGNOSIS — I129 Hypertensive chronic kidney disease with stage 1 through stage 4 chronic kidney disease, or unspecified chronic kidney disease: Secondary | ICD-10-CM | POA: Diagnosis not present

## 2019-09-28 DIAGNOSIS — M109 Gout, unspecified: Secondary | ICD-10-CM | POA: Diagnosis not present

## 2019-09-28 DIAGNOSIS — N183 Chronic kidney disease, stage 3 unspecified: Secondary | ICD-10-CM | POA: Diagnosis not present

## 2019-09-28 DIAGNOSIS — Z0000 Encounter for general adult medical examination without abnormal findings: Secondary | ICD-10-CM | POA: Diagnosis not present

## 2019-10-09 DIAGNOSIS — N183 Chronic kidney disease, stage 3 unspecified: Secondary | ICD-10-CM | POA: Diagnosis not present

## 2019-10-09 DIAGNOSIS — E785 Hyperlipidemia, unspecified: Secondary | ICD-10-CM | POA: Diagnosis not present

## 2019-10-09 DIAGNOSIS — I129 Hypertensive chronic kidney disease with stage 1 through stage 4 chronic kidney disease, or unspecified chronic kidney disease: Secondary | ICD-10-CM | POA: Diagnosis not present

## 2019-10-29 DIAGNOSIS — M25512 Pain in left shoulder: Secondary | ICD-10-CM | POA: Diagnosis not present

## 2019-11-06 HISTORY — PX: SHOULDER ARTHROSCOPY: SHX128

## 2019-11-30 ENCOUNTER — Other Ambulatory Visit: Payer: Self-pay | Admitting: Cardiology

## 2019-11-30 MED FILL — CLOPIDOGREL 75 MG TABLET: 75 | 90 days supply | Qty: 90 | Fill #0

## 2019-12-01 ENCOUNTER — Ambulatory Visit: Payer: Medicare Other

## 2019-12-04 DIAGNOSIS — M25811 Other specified joint disorders, right shoulder: Secondary | ICD-10-CM | POA: Diagnosis not present

## 2019-12-10 ENCOUNTER — Ambulatory Visit: Payer: Medicare Other | Attending: Family Medicine

## 2019-12-10 DIAGNOSIS — Z23 Encounter for immunization: Secondary | ICD-10-CM | POA: Insufficient documentation

## 2019-12-10 NOTE — Progress Notes (Signed)
   Covid-19 Vaccination Clinic  Name:  [name]    MRN: [id number] DOB: Jul 07, 1941  12/10/2019  [name] was observed post Covid-19 immunization for 15 minutes without incidence. He was provided with Vaccine Information Sheet and instruction to access the V-Safe system.   [name] was instructed to call 911 with any severe reactions post vaccine: [name] Difficulty breathing  . Swelling of your face and throat  . A fast heartbeat  . A bad rash all over your body  . Dizziness and weakness    Immunizations Administered    Name Date Dose VIS Date Route   Pfizer COVID-19 Vaccine 12/10/2019  9:18 AM 0.3 mL 10/16/2019 Intramuscular   Manufacturer: [place]   Lot: [id number]   [place]: [id number]

## 2019-12-21 DIAGNOSIS — M4722 Other spondylosis with radiculopathy, cervical region: Secondary | ICD-10-CM | POA: Diagnosis not present

## 2019-12-21 DIAGNOSIS — M4726 Other spondylosis with radiculopathy, lumbar region: Secondary | ICD-10-CM | POA: Diagnosis not present

## 2019-12-21 DIAGNOSIS — M25511 Pain in right shoulder: Secondary | ICD-10-CM | POA: Diagnosis not present

## 2019-12-21 DIAGNOSIS — M4802 Spinal stenosis, cervical region: Secondary | ICD-10-CM | POA: Diagnosis not present

## 2020-01-04 ENCOUNTER — Ambulatory Visit: Payer: Medicare Other | Attending: Internal Medicine

## 2020-01-04 DIAGNOSIS — Z23 Encounter for immunization: Secondary | ICD-10-CM

## 2020-01-04 NOTE — Progress Notes (Signed)
   Covid-19 Vaccination Clinic  Name:  [name]    MRN: [id number] DOB: [birth year]  01/04/2020  [name] was observed post Covid-19 immunization for 30 minutes based on pre-vaccination screening without incidence. He was provided with Vaccine Information Sheet and instruction to access the V-Safe system.   [name] was instructed to call 911 with any severe reactions post vaccine: [name] Difficulty breathing  . Swelling of your face and throat  . A fast heartbeat  . A bad rash all over your body  . Dizziness and weakness    Immunizations Administered    Name Date Dose VIS Date Route   Pfizer COVID-19 Vaccine 01/04/2020  1:41 PM 0.3 mL 10/16/2019 Intramuscular   Manufacturer: [place]   Lot: [id number]   [place]: [id number]

## 2020-01-08 DIAGNOSIS — E291 Testicular hypofunction: Secondary | ICD-10-CM | POA: Diagnosis not present

## 2020-01-14 DIAGNOSIS — E291 Testicular hypofunction: Secondary | ICD-10-CM | POA: Diagnosis not present

## 2020-01-14 DIAGNOSIS — N5201 Erectile dysfunction due to arterial insufficiency: Secondary | ICD-10-CM | POA: Diagnosis not present

## 2020-01-14 DIAGNOSIS — N401 Enlarged prostate with lower urinary tract symptoms: Secondary | ICD-10-CM | POA: Diagnosis not present

## 2020-01-14 DIAGNOSIS — R3915 Urgency of urination: Secondary | ICD-10-CM | POA: Diagnosis not present

## 2020-01-19 ENCOUNTER — Telehealth: Payer: Self-pay | Admitting: Oncology

## 2020-01-19 DIAGNOSIS — Z85828 Personal history of other malignant neoplasm of skin: Secondary | ICD-10-CM | POA: Diagnosis not present

## 2020-01-19 DIAGNOSIS — L821 Other seborrheic keratosis: Secondary | ICD-10-CM | POA: Diagnosis not present

## 2020-01-19 DIAGNOSIS — D2271 Melanocytic nevi of right lower limb, including hip: Secondary | ICD-10-CM | POA: Diagnosis not present

## 2020-01-19 DIAGNOSIS — D225 Melanocytic nevi of trunk: Secondary | ICD-10-CM | POA: Diagnosis not present

## 2020-01-19 NOTE — Telephone Encounter (Signed)
Received a new pt referral from Dr. Lovena Neighbours at [facility] for polycythemia. [name] returned my call and has been scheduled to see Dr. Alen Blew on 3/30 at 11am. Pt aware to arrive 15 minutes early.

## 2020-01-23 DIAGNOSIS — F410 Panic disorder [episodic paroxysmal anxiety] without agoraphobia: Secondary | ICD-10-CM | POA: Diagnosis not present

## 2020-01-25 DIAGNOSIS — I2510 Atherosclerotic heart disease of native coronary artery without angina pectoris: Secondary | ICD-10-CM | POA: Diagnosis not present

## 2020-01-25 DIAGNOSIS — F331 Major depressive disorder, recurrent, moderate: Secondary | ICD-10-CM | POA: Diagnosis not present

## 2020-01-25 DIAGNOSIS — F419 Anxiety disorder, unspecified: Secondary | ICD-10-CM | POA: Diagnosis not present

## 2020-01-25 DIAGNOSIS — Z86711 Personal history of pulmonary embolism: Secondary | ICD-10-CM | POA: Diagnosis not present

## 2020-01-28 DIAGNOSIS — A084 Viral intestinal infection, unspecified: Secondary | ICD-10-CM | POA: Diagnosis not present

## 2020-02-02 ENCOUNTER — Inpatient Hospital Stay: Payer: Medicare Other | Attending: Oncology | Admitting: Oncology

## 2020-02-02 ENCOUNTER — Ambulatory Visit: Payer: Medicare Other | Admitting: Adult Health

## 2020-02-02 ENCOUNTER — Other Ambulatory Visit: Payer: Self-pay

## 2020-02-02 VITALS — BP 122/68 | HR 95 | Temp 97.8°F | Resp 18 | Wt 241.0 lb

## 2020-02-02 DIAGNOSIS — Z87891 Personal history of nicotine dependence: Secondary | ICD-10-CM | POA: Diagnosis not present

## 2020-02-02 DIAGNOSIS — D751 Secondary polycythemia: Secondary | ICD-10-CM | POA: Diagnosis not present

## 2020-02-02 DIAGNOSIS — K219 Gastro-esophageal reflux disease without esophagitis: Secondary | ICD-10-CM | POA: Insufficient documentation

## 2020-02-02 DIAGNOSIS — Z86711 Personal history of pulmonary embolism: Secondary | ICD-10-CM | POA: Insufficient documentation

## 2020-02-02 DIAGNOSIS — Z7901 Long term (current) use of anticoagulants: Secondary | ICD-10-CM | POA: Insufficient documentation

## 2020-02-02 NOTE — Progress Notes (Signed)
Reason for the request:    Polycythemia  HPI: I was asked by Dr. Lovena Neighbours to evaluate [name] for the evaluation of polycythemia.  He is a 79 year old gentleman with history of gout and arthritis as well as low testosterone.  He has history of hypogonadism and receiving testosterone supplements biweekly.  This is supervised by Dr. Lovena Neighbours and laboratory data obtained on January 08, 2020 showed a testosterone level of 239 and hematocrit of 50.  Previous hematocrits in October 2020 was 47 with a hemoglobin of 15.9.  In September 2020 his hematocrit was 48.  Previous says CBC dating back to 2008 overall within normal range.  In 2019 he did have a mild elevation in his hematocrit of 51.6.  He reports no specific complaints regarding these findings.  He denies any headaches or recent thrombosis episodes.  He does have history of PE in the past and continues to be chronically anticoagulated on Eliquis.   He does not report any headaches, blurry vision, syncope or seizures. Does not report any fevers, chills or sweats.  Does not report any cough, wheezing or hemoptysis.  Does not report any chest pain, palpitation, orthopnea or leg edema.  Does not report any nausea, vomiting or abdominal pain.  Does not report any constipation or diarrhea.  Does not report any skeletal complaints.    Does not report frequency, urgency or hematuria.  Does not report any skin rashes or lesions. Does not report any heat or cold intolerance.  Does not report any lymphadenopathy or petechiae.  Does not report any anxiety or depression.  Remaining review of systems is negative.    Past Medical History:  Diagnosis Date  . Anxiety   . Arthritis    KNEES, NECK, SHOULDERS, HANDS  . Asthma   . Biceps tendon tear    right  . Cancer ([place])    skin cancer-  2 basal cell, 1 squamous cell  . GERD (gastroesophageal reflux disease)   . Gout, arthritis    BILATERAL GREAT TOE-- PER PT STABLE  . H/O transfusion of packed red blood cells     as a child/ s/p tonsillectomy  . History of pulmonary embolism    Maintained on lifelong anticoagulation with Eliquis.  . Lung nodule    BENIGN RIGHT UPPER--  CHEST CT 08-22-2011  . OSA (obstructive sleep apnea)    NO CPAP -states dr said possibly related to asthma  . S/P dilatation of esophageal stricture   . Stroke ([place])   . Torn rotator cuff    Right x 3  :  Past Surgical History:  Procedure Laterality Date  . CARPAL TUNNEL RELEASE Right 2010  . CORONARY CALCIUM SCORING WITH CT CORONARY ANGIOGRAM  05/29/2018   Coronary calcium score 416.  Possible obstructive CAD in the mid circumflex-OM1-OM 2, D2 and a moderate disease in the proximal LAD -->  FFR --> LAD: Proximal.90, mid.86 distal.68 D2.80, Cx: Mid.76 OM2.74: Hemodynamically significant distal LAD, D2, mid circumflex and OM2 lesions  . CORONARY STENT INTERVENTION N/A 06/10/2018   Procedure: CORONARY STENT INTERVENTION;  Surgeon: Jettie Booze, MD;  Location: [facility];;  90% mLAD --> DES PCI (ORSIRO 2.75X18) => 0%  . JOINT REPLACEMENT    . LEFT HEART CATH AND CORONARY ANGIOGRAPHY N/A 06/10/2018   Procedure: LEFT HEART CATH AND CORONARY ANGIOGRAPHY;  Surgeon: Jettie Booze, MD;  [facility]: [facility];;  90% mLAD --> DES PCI.  pRCA 25%. p-m Cx 40%. Ost OM1 50%,  ost OM2 40% with 50% mid.  pLAD 30% & ost D1 75 % (med Rx).  Normal LVEDP.   [name] NEPHRECTOMY    . SHOULDER ARTHROSCOPY Right   . TONSILLECTOMY  AGE 46  . TOTAL KNEE ARTHROPLASTY Left 10-15-2008  . TOTAL KNEE ARTHROPLASTY Right 01/18/2014   Procedure: RIGHT TOTAL KNEE ARTHROPLASTY;  Surgeon: Gearlean Alf, MD;  Location: [facility];  Service: Orthopedics;  Laterality: Right;  . TRANSTHORACIC ECHOCARDIOGRAM  01/2018    Normal LV size with moderate LVH.  EF 60 to 65% with GR 1 DD (normal for age)  . WRIST GANGLION EXCISION Right 1964  :   Current Outpatient Medications:  .  acetaminophen (TYLENOL) 500 MG tablet, Take 1,000 mg by mouth 2 (two) times  daily as needed for moderate pain. And prn, Disp: , Rfl:  .  allopurinol (ZYLOPRIM) 300 MG tablet, Take 300 mg by mouth daily., Disp: , Rfl:  .  apixaban (ELIQUIS) 5 MG TABS tablet, Take 5 mg by mouth 2 (two) times daily., Disp: , Rfl:  .  clopidogrel (PLAVIX) 75 MG tablet, TAKE 1 TABLET BY MOUTH ONCE A DAY WITH BREAKFAST, Disp: 90 tablet, Rfl: 2 .  escitalopram (LEXAPRO) 20 MG tablet, Take 20 mg by mouth daily. , Disp: , Rfl:  .  gabapentin (NEURONTIN) 300 MG capsule, Take 300 mg by mouth 3 (three) times daily., Disp: , Rfl:  .  methocarbamol (ROBAXIN) 750 MG tablet, Take 750 mg by mouth 3 (three) times daily., Disp: , Rfl:  .  Multiple Vitamin (MULTIVITAMIN WITH MINERALS) TABS tablet, Take 1 tablet by mouth daily., Disp: , Rfl:  .  nitroGLYCERIN (NITROSTAT) 0.4 MG SL tablet, Place 1 tablet (0.4 mg total) under the tongue every 5 (five) minutes as needed for chest pain., Disp: 25 tablet, Rfl: 3 .  rosuvastatin (CRESTOR) 20 MG tablet, TAKE 1 TABLET BY MOUTH EVERY NIGHT AT BEDTIME, Disp: 90 tablet, Rfl: 10 .  Testosterone Cypionate 200 MG/ML KIT, Inject 400 mg into the muscle every 14 (fourteen) days. For IM use only, Disp: , Rfl:  .  valsartan (DIOVAN) 320 MG tablet, Take 320 mg by mouth daily., Disp: , Rfl: :  Allergies  Allergen Reactions  . Garlic Anaphylaxis  . Onion Anaphylaxis  . Codeine Other (See Comments)    "PASSES OUT"  . Hydrocodone     Pt states that he is allergic to synthetic hydrocodone.  . Novocain [Procaine] Nausea Only  :  Family History  Problem Relation Age of Onset  . Deep vein thrombosis Father   . Stroke Father   . Stroke Brother   . Colon cancer Mother   . Migraines Neg Hx   :  Social History   Socioeconomic History  . Marital status: Married    Spouse name: Not on file  . Number of children: 4  . Years of education: some master's classes  . Highest education level: Bachelor's degree (e.g., BA, AB, BS)  Occupational History  . Occupation: CNA   Tobacco Use  . Smoking status: Former Smoker    Packs/day: 1.00    Years: 25.00    Pack years: 25.00    Types: Cigarettes    Quit date: 07/13/1980    Years since quitting: 39.5  . Smokeless tobacco: Never Used  Substance and Sexual Activity  . Alcohol use: Yes    Comment: RARE  . Drug use: No  . Sexual activity: Yes    Partners: Female  Other Topics Concern  .  Not on file  Social History Narrative   Lives at home with his wife & youngest daughter and her family   Right handed   Drinks 2 cups of coffee daily, occasional cup of tea   Social Determinants of Health   Financial Resource Strain:   . Difficulty of Paying Living Expenses:   Food Insecurity:   . Worried About Charity fundraiser in the Last Year:   . Arboriculturist in the Last Year:   Transportation Needs:   . Film/video editor (Medical):   [name] Lack of Transportation (Non-Medical):   Physical Activity:   . Days of Exercise per Week:   . Minutes of Exercise per Session:   Stress:   . Feeling of Stress :   Social Connections:   . Frequency of Communication with Friends and Family:   . Frequency of Social Gatherings with Friends and Family:   . Attends Religious Services:   . Active Member of Clubs or Organizations:   . Attends Archivist Meetings:   [name] Marital Status:   Intimate Partner Violence:   . Fear of Current or Ex-Partner:   . Emotionally Abused:   [name] Physically Abused:   . Sexually Abused:   :  Pertinent items are noted in HPI.  Exam: Blood pressure 122/68, pulse 95, temperature 97.8 F (36.6 C), resp. rate 18, weight 241 lb (109.3 kg), SpO2 99 %.   ECOG 0 General appearance: alert and cooperative appeared without distress. Head: atraumatic without any abnormalities. Eyes: conjunctivae/corneas clear. PERRL.  Sclera anicteric. Throat: lips, mucosa, and tongue normal; without oral thrush or ulcers. Resp: clear to auscultation bilaterally without rhonchi, wheezes or dullness to  percussion. Cardio: regular rate and rhythm, S1, S2 normal, no murmur, click, rub or gallop GI: soft, non-tender; bowel sounds normal; no masses,  no organomegaly Skin: Skin color, texture, turgor normal. No rashes or lesions Lymph nodes: Cervical, supraclavicular, and axillary nodes normal. Neurologic: Grossly normal without any motor, sensory or deep tendon reflexes. Musculoskeletal: No joint deformity or effusion.    Assessment and Plan:   79 year old with:  1.  Polycythemia related to secondary causes detected in March 2021.  His hematocrit is around 50 although the upper limit of normal was around 52 according to the lab.  His previous counts have ranged close to normal range in the upper limit of normal.  This is in the setting of testosterone replacement.  The natural course of these findings as well as differential diagnosis was reviewed.  Polycythemia vera is considered extremely unlikely in this setting.  Secondary polycythemia is likely the cause here and testosterone replacement remains the most likely etiology.  From a management standpoint, I see the elevation in his hematocrit is very mild and asymptomatic.  I do not see any need for any further hematology evaluation, work-up or intervention.  Blood donation periodically would be reasonable if he wishes to do so.  His risk of thrombosis is very low given he is chronically anticoagulated   2.  Follow-up: I am happy to see him in the future as needed.  45  minutes were dedicated to this visit. The time was spent on reviewing laboratory data, discussing treatment options, discussing differential diagnosis and answering questions regarding future plan.     A copy of this consult has been forwarded to the requesting physician.

## 2020-02-03 ENCOUNTER — Telehealth: Payer: Self-pay | Admitting: *Deleted

## 2020-02-03 NOTE — Progress Notes (Signed)
Cardiology Office Note   Date:  02/08/2020   ID:  ZAYVIN EBARB, DOB [birth year], MRN [id number]  PCP:  Harlan Stains, MD  Cardiologist:  Dr.Harding  CC: Follow Up   History of Present Illness: [name] is a 79 y.o. male who presents for ongoing assessment and management of DVT with recurrent PE, on Eliquis, hx of CVA, CAD with PCI of the LAD, on Plavix. Seen last by Dr. Ellyn Hack 08/04/2018 and was having DOE which was felt to be related to deconditioning. However, dyspnea is his angina equivalent.    He is to have left shoulder surgery on date to be determined through [facility], Dr. Victorino December. He was cleared to proceed and to hold Eliquis 48 hours prior to procedure.  He has not yet had his surgery scheduled and will need information concerning holding antiplatelet therapy.  He denies any chest pain dyspnea on exertion or fatigue.  He does continue to have some shoulder discomfort on the left.  He has been medically compliant.  Past Medical History:  Diagnosis Date  . Anxiety   . Arthritis    KNEES, NECK, SHOULDERS, HANDS  . Asthma   . Biceps tendon tear    right  . Cancer ([place])    skin cancer-  2 basal cell, 1 squamous cell  . GERD (gastroesophageal reflux disease)   . Gout, arthritis    BILATERAL GREAT TOE-- PER PT STABLE  . H/O transfusion of packed red blood cells    as a child/ s/p tonsillectomy  . History of pulmonary embolism    Maintained on lifelong anticoagulation with Eliquis.  . Lung nodule    BENIGN RIGHT UPPER--  CHEST CT 08-22-2011  . OSA (obstructive sleep apnea)    NO CPAP -states dr said possibly related to asthma  . S/P dilatation of esophageal stricture   . Stroke ([place])   . Torn rotator cuff    Right x 3    Past Surgical History:  Procedure Laterality Date  . CARPAL TUNNEL RELEASE Right 2010  . CORONARY CALCIUM SCORING WITH CT CORONARY ANGIOGRAM  05/29/2018   Coronary calcium score 416.  Possible obstructive CAD in the mid  circumflex-OM1-OM 2, D2 and a moderate disease in the proximal LAD -->  FFR --> LAD: Proximal.90, mid.86 distal.68 D2.80, Cx: Mid.76 OM2.74: Hemodynamically significant distal LAD, D2, mid circumflex and OM2 lesions  . CORONARY STENT INTERVENTION N/A 06/10/2018   Procedure: CORONARY STENT INTERVENTION;  Surgeon: Jettie Booze, MD;  Location: [facility];;  90% mLAD --> DES PCI (ORSIRO 2.75X18) => 0%  . JOINT REPLACEMENT    . LEFT HEART CATH AND CORONARY ANGIOGRAPHY N/A 06/10/2018   Procedure: LEFT HEART CATH AND CORONARY ANGIOGRAPHY;  Surgeon: Jettie Booze, MD;  [facility];;  90% mLAD --> DES PCI.  pRCA 25%. p-m Cx 40%. Ost OM1 50%, ost OM2 40% with 50% mid.  pLAD 30% & ost D1 75 % (med Rx).  Normal LVEDP.   [name] NEPHRECTOMY    . SHOULDER ARTHROSCOPY Right   . TONSILLECTOMY  AGE 48  . TOTAL KNEE ARTHROPLASTY Left 10-15-2008  . TOTAL KNEE ARTHROPLASTY Right 01/18/2014   Procedure: RIGHT TOTAL KNEE ARTHROPLASTY;  Surgeon: Gearlean Alf, MD;  Location: [facility];  Service: Orthopedics;  Laterality: Right;  . TRANSTHORACIC ECHOCARDIOGRAM  01/2018    Normal LV size with moderate LVH.  EF 60 to 65% with GR 1 DD (normal for age)  .  WRIST GANGLION EXCISION Right 1964     Current Outpatient Medications  Medication Sig Dispense Refill  . acetaminophen (TYLENOL) 500 MG tablet Take 1,000 mg by mouth 2 (two) times daily as needed for moderate pain. And prn    . allopurinol (ZYLOPRIM) 300 MG tablet Take 300 mg by mouth daily.    [name] apixaban (ELIQUIS) 5 MG TABS tablet Take 5 mg by mouth 2 (two) times daily.    . clopidogrel (PLAVIX) 75 MG tablet TAKE 1 TABLET BY MOUTH ONCE A DAY WITH BREAKFAST 90 tablet 2  . escitalopram (LEXAPRO) 20 MG tablet Take 20 mg by mouth daily.     [name] gabapentin (NEURONTIN) 300 MG capsule Take 300 mg by mouth 3 (three) times daily.    . methocarbamol (ROBAXIN) 750 MG tablet Take 750 mg by mouth 3 (three) times daily.    . Multiple Vitamin  (MULTIVITAMIN WITH MINERALS) TABS tablet Take 1 tablet by mouth daily.    . nitroGLYCERIN (NITROSTAT) 0.4 MG SL tablet Place 1 tablet (0.4 mg total) under the tongue every 5 (five) minutes as needed for chest pain. 25 tablet 3  . rosuvastatin (CRESTOR) 20 MG tablet TAKE 1 TABLET BY MOUTH EVERY NIGHT AT BEDTIME 90 tablet 10  . tadalafil (CIALIS) 20 MG tablet Take by mouth as needed.    . tadalafil (CIALIS) 5 MG tablet Take 5 mg by mouth daily.    [name] testosterone cypionate (DEPOTESTOSTERONE CYPIONATE) 200 MG/ML injection once a week. 1 ml weekly    . valsartan (DIOVAN) 320 MG tablet Take 320 mg by mouth daily.     No current facility-administered medications for this visit.    Allergies:   Garlic, Onion, Codeine, Hydrocodone, and Novocain [procaine]    Social History:  The patient  reports that he quit smoking about 39 years ago. His smoking use included cigarettes. He has a 25.00 pack-year smoking history. He has never used smokeless tobacco. He reports current alcohol use. He reports that he does not use drugs.   Family History:  The patient's family history includes Colon cancer in his mother; Deep vein thrombosis in his father; Stroke in his brother and father.    ROS: All other systems are reviewed and negative. Unless otherwise mentioned in H&P    PHYSICAL EXAM: VS:  BP 122/72   Pulse 78   Ht 5' 10.5" (1.791 m)   Wt 243 lb (110.2 kg)   SpO2 99%   BMI 34.37 kg/m  , BMI Body mass index is 34.37 kg/m. GEN: Well nourished, well developed, in no acute distress HEENT: normal Neck: no JVD, carotid bruits, or masses Cardiac: RRR; no murmurs, rubs, or gallops,no edema  Respiratory:  Clear to auscultation bilaterally, normal work of breathing GI: soft, nontender, nondistended, + BS MS: no deformity or atrophy Skin: warm and dry, no rash Neuro:  Strength and sensation are intact Psych: euthymic mood, full affect   EKG: Normal sinus rhythm with occasional PVCs.  Rate of 74  bpm.  Recent Labs: No results found for requested labs within last 8760 hours.    Lipid Panel    Component Value Date/Time   CHOL 106 08/19/2018 0910   TRIG 97 08/19/2018 0910   HDL 39 (L) 08/19/2018 0910   CHOLHDL 2.7 08/19/2018 0910   CHOLHDL 4.5 07/05/2014 0736   VLDL 42 (H) 07/05/2014 0736   LDLCALC 48 08/19/2018 0910      Wt Readings from Last 3 Encounters:  02/08/20 243 lb (110.2 kg)  02/02/20 241 lb (109.3 kg)  08/05/19 240 lb 3.2 oz (109 kg)      Other studies Reviewed:  LHC 06/10/2018  Prox RCA lesion is 25% stenosed.  Ost 1st Mrg lesion is 50% stenosed.  Prox Cx to Mid Cx lesion is 40% stenosed.  Ost 2nd Mrg lesion is 40% stenosed.  2nd Mrg lesion is 50% stenosed.  Prox LAD lesion is 30% stenosed.  Ost 1st Diag lesion is 75% stenosed.  Mid LAD lesion is 90% stenosed.  A drug-eluting stent was successfully placed using a STENT ORSIRO 2.75X18.  Post intervention, there is a 0% residual stenosis. LV end diastolic pressure is normal.  Echocardiogram 01/16/2018 Left ventricle: The cavity size was normal. Wall thickness was  increased in a pattern of moderate LVH. There was focal basal  hypertrophy. Systolic function was normal. The estimated ejection  fraction was in the range of 60% to 65%. Wall motion was normal;  there were no regional wall motion abnormalities. Doppler  parameters are consistent with abnormal left ventricular  relaxation (grade 1 diastolic dysfunction).   ASSESSMENT AND PLAN:  1.  Coronary artery disease: Status post drug-eluting stent to the mid LAD in 2019.  He is continued on Plavix 75 mg daily, and ARB.  He may hold Plavix for 5 days prior to left shoulder arthroscopy and begin again as soon as possible thereafter.  He is currently asymptomatic and cleared to move forward with his procedure  2.  Hypertension: Continues on valsartan 320 mg daily.  Blood pressure is excellently controlled.  No changes in his  regimen.  3.  Hypercholesterolemia: Currently not on statin therapy.  Most recent labs were drawn by his primary care provider at [facility].  Goal of LDL less than 70 in a patient with CAD.  Would recommend statin therapy.  I will draw fasting lipids and LFTs within the next few weeks to evaluate his status.  This has been explained to the patient who verbalizes understanding.  4.  Recurrent DVT: Remains on Eliquis 5 mg twice daily.  He may hold Eliquis for 48 hours prior to left shoulder surgery and begin again as soon as possible thereafter.  Current medicines are reviewed at length with the patient today.  I have spent 25 minutes dedicated to the care of this patient on the date of this encounter to include pre-visit review of records, assessment, management and diagnostic testing,with shared decision making.  Labs/ tests ordered today include: Fasting Lipids and LFT's   [name], ANP, AACC   02/08/2020 9:50 AM    [facility] [address] [place] Office [phone] Fax [phone]  Notice: This dictation was prepared with Dragon dictation along with smaller phrase technology. Any transcriptional errors that result from this process are unintentional and may not be corrected upon review.

## 2020-02-03 NOTE — Telephone Encounter (Signed)
Pt takes Eliquis for history of DVT and recurrent PE (first in 1981), none within the past 5 years, also has history of stroke. CrCl 56mL/min. Per Dr Allison Quarry office visit 08/05/19, pt ok to hold Eliquis 48-72 hours prior to procedure if needed.  Recommend holding Eliquis for 2 days prior to procedure and resume as soon as safely possible afterwards.

## 2020-02-03 NOTE — Telephone Encounter (Signed)
Awaiting pharmacy recommendations on Eliquis.

## 2020-02-03 NOTE — Telephone Encounter (Signed)
   Primary Cardiologist: Glenetta Hew, MD  Chart reviewed as part of pre-operative protocol coverage. Given past medical history and time since last visit, based on ACC/AHA guidelines, [name] would be at acceptable risk for the planned procedure without further cardiovascular testing.   Per Pharmacy Recommendations Pt takes Eliquis for history of DVT and recurrent PE (first in 1981), none within the past 5 years, also has history of stroke. CrCl 4mL/min. Per Dr Allison Quarry office visit 08/05/19, pt ok to hold Eliquis 48-72 hours prior to procedure if needed.  Recommend holding Eliquis for 2 days prior to procedure and resume as soon as safely possible afterwards.  I will route this recommendation to the requesting party via Epic fax function and remove from pre-op pool.  Please call with questions.  Phill Myron. West Pugh, ANP, AACC  02/03/2020, 2:28 PM

## 2020-02-03 NOTE — Telephone Encounter (Signed)
   [facility] Pre-operative Risk Assessment    Request for surgical clearance:  1. What type of surgery is being performed? Left Shoulder Scope ext deb, SAD, DCR   2. When is this surgery scheduled? TBD   3. What type of clearance is required (medical clearance vs. Pharmacy clearance to hold med vs. Both)? Medical  4. Are there any medications that need to be held prior to surgery and how long?Eliquis   5. Practice name and name of physician performing surgery? [facility] Dr Victorino December  6. What is your office phone number [phone]    7.   What is your office fax number Derl Barrow [phone]  8.   Anesthesia type (None, local, MAC, general) ? Georgiann Hahn 02/03/2020, 11:54 AM  _________________________________________________________________   (provider comments below)

## 2020-02-08 ENCOUNTER — Ambulatory Visit: Payer: Medicare Other | Admitting: Adult Health

## 2020-02-08 ENCOUNTER — Other Ambulatory Visit: Payer: Self-pay

## 2020-02-08 ENCOUNTER — Encounter: Payer: Self-pay | Admitting: Adult Health

## 2020-02-08 VITALS — BP 122/72 | HR 78 | Ht 70.5 in | Wt 243.0 lb

## 2020-02-08 DIAGNOSIS — E785 Hyperlipidemia, unspecified: Secondary | ICD-10-CM

## 2020-02-08 DIAGNOSIS — I10 Essential (primary) hypertension: Secondary | ICD-10-CM

## 2020-02-08 DIAGNOSIS — Z79899 Other long term (current) drug therapy: Secondary | ICD-10-CM | POA: Diagnosis not present

## 2020-02-08 DIAGNOSIS — Z955 Presence of coronary angioplasty implant and graft: Secondary | ICD-10-CM

## 2020-02-08 DIAGNOSIS — Z01810 Encounter for preprocedural cardiovascular examination: Secondary | ICD-10-CM

## 2020-02-08 DIAGNOSIS — I2510 Atherosclerotic heart disease of native coronary artery without angina pectoris: Secondary | ICD-10-CM

## 2020-02-08 NOTE — Patient Instructions (Addendum)
Medication Instructions:  Continue current medications  *If you need a refill on your cardiac medications before your next appointment, please call your pharmacy*   Lab Work: Fasting Lipid and Liver  If you have labs (blood work) drawn today and your tests are completely normal, you will receive your results only by: [name] MyChart Message (if you have MyChart) OR . A paper copy in the mail If you have any lab test that is abnormal or we need to change your treatment, we will call you to review the results.   Testing/Procedures: None Ordered   Follow-Up: At [facility], you and your health needs are our priority.  As part of our continuing mission to provide you with exceptional heart care, we have created designated Provider Care Teams.  These Care Teams include your primary Cardiologist (physician) and Advanced Practice Providers (APPs -  Physician Assistants and Nurse Practitioners) who all work together to provide you with the care you need, when you need it.  We recommend signing up for the patient portal called "MyChart".  Sign up information is provided on this After Visit Summary.  MyChart is used to connect with patients for Virtual Visits (Telemedicine).  Patients are able to view lab/test results, encounter notes, upcoming appointments, etc.  Non-urgent messages can be sent to your provider as well.   To learn more about what you can do with MyChart, go to [email or web address].    Your next appointment:   3 month(s)  The format for your next appointment:   In Person  Provider:   You may see Glenetta Hew, MD or one of the following Advanced Practice Providers on your designated Care Team:    Rosaria Ferries, PA-C  Jory Sims, DNP, ANP  Cadence Kathlen Mody, NP    Other Instructions HOLD Eliquis 2 days prior to procedure HOLD Plavix 5 days prior to procedure

## 2020-02-09 DIAGNOSIS — Z79899 Other long term (current) drug therapy: Secondary | ICD-10-CM | POA: Diagnosis not present

## 2020-02-09 DIAGNOSIS — I10 Essential (primary) hypertension: Secondary | ICD-10-CM | POA: Diagnosis not present

## 2020-02-09 DIAGNOSIS — E785 Hyperlipidemia, unspecified: Secondary | ICD-10-CM | POA: Diagnosis not present

## 2020-02-09 LAB — HEPATIC FUNCTION PANEL
ALT: 12 IU/L (ref 0–44)
AST: 17 IU/L (ref 0–40)
Albumin: 4.5 g/dL (ref 3.7–4.7)
Alkaline Phosphatase: 45 IU/L (ref 39–117)
Bilirubin Total: 0.6 mg/dL (ref 0.0–1.2)
Bilirubin, Direct: 0.2 mg/dL (ref 0.00–0.40)
Total Protein: 6.8 g/dL (ref 6.0–8.5)

## 2020-02-09 LAB — LIPID PANEL
Chol/HDL Ratio: 2.7 ratio (ref 0.0–5.0)
Cholesterol, Total: 93 mg/dL — ABNORMAL LOW (ref 100–199)
HDL: 35 mg/dL — ABNORMAL LOW (ref >39)
LDL Chol Calc (NIH): 40 mg/dL (ref 0–99)
Triglycerides: 93 mg/dL (ref 0–149)
VLDL Cholesterol Cal: 18 mg/dL (ref 5–40)

## 2020-02-11 ENCOUNTER — Telehealth: Payer: Self-pay | Admitting: Cardiology

## 2020-02-11 ENCOUNTER — Telehealth: Payer: Self-pay | Admitting: *Deleted

## 2020-02-11 MED ORDER — ROSUVASTATIN CALCIUM 10 MG PO TABS: 20.0000 mg | ORAL_TABLET | Freq: Every day | ORAL | 2 refills | Status: DC

## 2020-02-11 NOTE — Telephone Encounter (Signed)
New message  Patient is returning call about results. Please give patient a call back. 

## 2020-02-11 NOTE — Telephone Encounter (Signed)
-----   Message from Lendon Colonel, NP sent at 02/10/2020  7:41 AM EDT ----- Reduce Crestor to 10 mg from 20 mg daily. Repeat fasting lipids and LFT's in 6 months.

## 2020-02-11 NOTE — Telephone Encounter (Signed)
Pt aware of his lab result

## 2020-02-11 NOTE — Telephone Encounter (Addendum)
Pt aware of his lab result, crestor 10 mg send in to pharmacy will mail lab slip to pt in 6 Months

## 2020-02-12 ENCOUNTER — Other Ambulatory Visit: Payer: Self-pay

## 2020-02-15 ENCOUNTER — Other Ambulatory Visit: Payer: Self-pay | Admitting: *Deleted

## 2020-02-15 DIAGNOSIS — M4726 Other spondylosis with radiculopathy, lumbar region: Secondary | ICD-10-CM | POA: Diagnosis not present

## 2020-02-15 DIAGNOSIS — M4802 Spinal stenosis, cervical region: Secondary | ICD-10-CM | POA: Diagnosis not present

## 2020-02-15 DIAGNOSIS — M25511 Pain in right shoulder: Secondary | ICD-10-CM | POA: Diagnosis not present

## 2020-02-15 DIAGNOSIS — M4722 Other spondylosis with radiculopathy, cervical region: Secondary | ICD-10-CM | POA: Diagnosis not present

## 2020-02-15 MED ORDER — ROSUVASTATIN CALCIUM 20 MG PO TABS: 20.0000 mg | ORAL_TABLET | Freq: Every day | ORAL | 2 refills | Status: DC

## 2020-02-24 NOTE — Telephone Encounter (Signed)
----   Message -----      From:Johnaton P Brunsman      Sent:02/22/2020 12:43 PM EDT        [id number] Ellyn Hack, MD   Subject:Non-Urgent Medical Question  Can we discontinue the clopidogrel? You said we would stop it after a year and it has been a year and a half. I bleed like crazy from every little scratch and it is messy. [name]   Reply---  It is reasonable to stop.  But would like for you to go back on81 mg Aspirin.   Glenetta Hew, MD  ===View-only below this line===

## 2020-02-25 DIAGNOSIS — F419 Anxiety disorder, unspecified: Secondary | ICD-10-CM | POA: Diagnosis not present

## 2020-02-25 DIAGNOSIS — F331 Major depressive disorder, recurrent, moderate: Secondary | ICD-10-CM | POA: Diagnosis not present

## 2020-02-29 NOTE — Pre-Procedure Instructions (Signed)
[address] [place], [place] [address] [place] [address] Phone: [phone] Fax: [phone]  [place], [place] - [address] [facility] [address] [place] [address] Phone: [phone] Fax: [place], [place] - [place] [address] Phone: [phone] Fax: [phone]      Your procedure is scheduled on Thursday April 29th.  Report to [facility] Main Entrance "A" at 5:30 A.M., and check in at the Admitting office.  Call this number if you have problems the morning of surgery:  [phone]  Call [phone] if you have any questions prior to your surgery date Monday-Friday 8am-4pm    Remember:  Do not eat or drink after midnight the night before your surgery  You may drink clear liquids until 4:30 the morning of your surgery.   Clear liquids allowed are: Water, Non-Citrus Juices (without pulp), Carbonated Beverages, Clear Tea, Black Coffee Only, and Gatorade Patient Instructions  . The night before surgery:  o No food after midnight. ONLY clear liquids after midnight  . The day of surgery (if you do NOT have diabetes):  o Drink ONE (1) Pre-Surgery Clear Ensure as directed.   o This drink was given to you during your hospital  pre-op appointment visit. o The pre-op nurse will instruct you on the time to drink the  Pre-Surgery Ensure depending on your surgery time. o Finish the drink at the designated time by the pre-op nurse.  o Nothing else to drink after completing the  Pre-Surgery Clear Ensure.         If you have questions, please contact your surgeon's office.     Take these medicines the morning of surgery with A SIP OF WATER  allopurinol (ZYLOPRIM)  escitalopram (LEXAPRO) gabapentin (NEURONTIN) methocarbamol (ROBAXIN)-if needed nitroGLYCERIN (NITROSTAT)-if  needed traMADol-acetaminophen (ULTRACET) -if needed  Follow your surgeon's instructions on when to stop apixaban (ELIQUIS) and clopidogrel (PLAVIX) .  If no instructions were given by your surgeon then you will need to call the office to get those instructions.     As of today, STOP taking any Aspirin (unless otherwise instructed by your surgeon) and Aspirin containing products, Aleve, Naproxen, Ibuprofen, Motrin, Advil, Goody's, BC's, all herbal medications, fish oil, and all vitamins.                      Do not wear jewelry, make up, or nail polish            Do not wear lotions, powders, perfumes/colognes, or deodorant.            Do not shave 48 hours prior to surgery.  Men may shave face and neck.            Do not bring valuables to the hospital.            [facility] is not responsible for any belongings or valuables.  Do NOT Smoke (Tobacco/Vapping) or drink Alcohol 24 hours prior to your procedure If you use a CPAP at night, you may bring all equipment for your overnight stay.   Contacts, glasses, dentures or bridgework may not be worn into surgery.      For patients admitted to the hospital, discharge time will be determined by your treatment team.   Patients discharged the day of surgery will not be allowed to drive home, and someone  needs to stay with them for 24 hours.    Special instructions:   [facility]- Preparing For Surgery  Before surgery, you can play an important role. Because skin is not sterile, your skin needs to be as free of germs as possible. You can reduce the number of germs on your skin by washing with CHG (chlorahexidine gluconate) Soap before surgery.  CHG is an antiseptic cleaner which kills germs and bonds with the skin to continue killing germs even after washing.    Oral Hygiene is also important to reduce your risk of infection.  Remember - BRUSH YOUR TEETH THE MORNING OF SURGERY WITH YOUR REGULAR TOOTHPASTE  Please do not use if you have an  allergy to CHG or antibacterial soaps. If your skin becomes reddened/irritated stop using the CHG.  Do not shave (including legs and underarms) for at least 48 hours prior to first CHG shower. It is OK to shave your face.  Please follow these instructions carefully.   1. Shower the NIGHT BEFORE SURGERY and the MORNING OF SURGERY with CHG Soap.   2. If you chose to wash your hair, wash your hair first as usual with your normal shampoo.  3. After you shampoo, rinse your hair and body thoroughly to remove the shampoo.  4. Use CHG as you would any other liquid soap. You can apply CHG directly to the skin and wash gently with a scrungie or a clean washcloth.   5. Apply the CHG Soap to your body ONLY FROM THE NECK DOWN.  Do not use on open wounds or open sores. Avoid contact with your eyes, ears, mouth and genitals (private parts). Wash Face and genitals (private parts)  with your normal soap.   6. Wash thoroughly, paying special attention to the area where your surgery will be performed.  7. Thoroughly rinse your body with warm water from the neck down.  8. DO NOT shower/wash with your normal soap after using and rinsing off the CHG Soap.  9. Pat yourself dry with a CLEAN TOWEL.  10. Wear CLEAN PAJAMAS to bed the night before surgery, wear comfortable clothes the morning of surgery  11. Place CLEAN SHEETS on your bed the night of your first shower and DO NOT SLEEP WITH PETS.   Day of Surgery:   Do not apply any deodorants/lotions.  Please wear clean clothes to the hospital/surgery center.   Remember to brush your teeth WITH YOUR REGULAR TOOTHPASTE.   Please read over the following fact sheets that you were given.

## 2020-03-01 ENCOUNTER — Encounter (HOSPITAL_COMMUNITY): Payer: Self-pay

## 2020-03-01 ENCOUNTER — Encounter (HOSPITAL_COMMUNITY)
Admission: RE | Admit: 2020-03-01 | Discharge: 2020-03-01 | Disposition: A | Discharge status: Home / Self Care | Payer: Medicare Other | Source: Ambulatory Visit | Attending: Orthopedic Surgery | Admitting: Orthopedic Surgery

## 2020-03-01 ENCOUNTER — Other Ambulatory Visit: Payer: Self-pay

## 2020-03-01 ENCOUNTER — Other Ambulatory Visit (HOSPITAL_COMMUNITY)
Admission: RE | Admit: 2020-03-01 | Discharge: 2020-03-01 | Disposition: A | Discharge status: Home / Self Care | Payer: Medicare Other | Source: Ambulatory Visit | Attending: Orthopedic Surgery | Admitting: Orthopedic Surgery

## 2020-03-01 DIAGNOSIS — Z20822 Contact with and (suspected) exposure to covid-19: Secondary | ICD-10-CM | POA: Diagnosis not present

## 2020-03-01 DIAGNOSIS — Z01812 Encounter for preprocedural laboratory examination: Secondary | ICD-10-CM | POA: Diagnosis not present

## 2020-03-01 HISTORY — DX: Atherosclerotic heart disease of native coronary artery without angina pectoris: I25.10

## 2020-03-01 LAB — BASIC METABOLIC PANEL
Anion gap: 9 (ref 5–15)
BUN: 14 mg/dL (ref 8–23)
CO2: 28 mmol/L (ref 22–32)
Calcium: 8.9 mg/dL (ref 8.9–10.3)
Chloride: 102 mmol/L (ref 98–111)
Creatinine, Ser: 1.36 mg/dL — ABNORMAL HIGH (ref 0.61–1.24)
GFR calc Af Amer: 58 mL/min — ABNORMAL LOW (ref >60)
GFR calc non Af Amer: 50 mL/min — ABNORMAL LOW (ref >60)
Glucose, Bld: 107 mg/dL — ABNORMAL HIGH (ref 70–99)
Potassium: 4.1 mmol/L (ref 3.5–5.1)
Sodium: 139 mmol/L (ref 135–145)

## 2020-03-01 LAB — CBC
HCT: 50 % (ref 39.0–52.0)
Hemoglobin: 16.5 g/dL (ref 13.0–17.0)
MCH: 32 pg (ref 26.0–34.0)
MCHC: 33 g/dL (ref 30.0–36.0)
MCV: 97.1 fL (ref 80.0–100.0)
Platelets: 158 10*3/uL (ref 150–400)
RBC: 5.15 MIL/uL (ref 4.22–5.81)
RDW: 13.6 % (ref 11.5–15.5)
WBC: 6.3 10*3/uL (ref 4.0–10.5)
nRBC: 0 % (ref 0.0–0.2)

## 2020-03-01 LAB — SARS CORONAVIRUS 2 (TAT 6-24 HRS): SARS Coronavirus 2: NEGATIVE

## 2020-03-01 NOTE — Progress Notes (Signed)
PCP:  Harlan Stains, MD Cardiologist:  Glenetta Hew, MD  EKG:  08/04/20 CXR:  N/A ECHO:  01/16/18 Stress Test:  Denies Cardiac Cath:  06/10/18  Covid test 03/01/20  Anesthesia Review:  Yes, cardiac history.  Cardiac stent.  Last dose Eliquis 02/28/20 PM.  Last dose Plavis /23/21 AM.  Draw PT/INR DOS.    Patient denies shortness of breath, fever, cough, and chest pain at PAT appointment.  Patient verbalized understanding of instructions provided today at the PAT appointment.  Patient asked to review instructions at home and day of surgery.

## 2020-03-02 ENCOUNTER — Encounter (HOSPITAL_COMMUNITY): Payer: Self-pay | Admitting: Orthopedic Surgery

## 2020-03-02 NOTE — Progress Notes (Signed)
Anesthesia Chart Review:  Follows with cardiology for hx of DVT with recurrent PE (first in 1981, none in 5 years), on Eliquis, hx of CVA, CAD with PCI of the LAD, on ( was on Plavix, recently cleared to dc). Last seen 4/5/21and discussed that pt was cleared for upcoming shoulder surgery. Per note, "He may hold Plavix for 5 days prior to left shoulder arthroscopy and begin again as soon as possible thereafter.  He is currently asymptomatic and cleared to move forward with his procedure."  Per telephone encounter 02/24/20, Dr. Ellyn Hack cleared pt to discontinue Plavix indefinitely and resume 81 mg ASA in its place.  Preop labs reviewed, mildly elevated creatinine 1.36, baseline appears to be ~1.35. Labs otherwise unremarkable.   EKG 08/04/20: Sinus rhythm with occasional PVCs. Rate 74. Cannot rule out inferior infarct, age undetermined.   LHC 06/10/2018  Prox RCA lesion is 25% stenosed.  Ost 1st Mrg lesion is 50% stenosed.  Prox Cx to Mid Cx lesion is 40% stenosed.  Ost 2nd Mrg lesion is 40% stenosed.  2nd Mrg lesion is 50% stenosed.  Prox LAD lesion is 30% stenosed.  Ost 1st Diag lesion is 75% stenosed.  Mid LAD lesion is 90% stenosed.  A drug-eluting stent was successfully placed using a STENT ORSIRO 2.75X18.  Post intervention, there is a 0% residual stenosis. LV end diastolic pressure is normal.  Echocardiogram 01/16/2018 Left ventricle: The cavity size was normal. Wall thickness was  increased in a pattern of moderate LVH. There was focal basal  hypertrophy. Systolic function was normal. The estimated ejection  fraction was in the range of 60% to 65%. Wall motion was normal;  there were no regional wall motion abnormalities. Doppler  parameters are consistent with abnormal left ventricular  relaxation (grade 1 diastolic dysfunction).    Wynonia Musty [facility] Short Stay Center/Anesthesiology Phone [phone] 03/02/2020 8:33 AM

## 2020-03-02 NOTE — Anesthesia Preprocedure Evaluation (Addendum)
Anesthesia Evaluation  Patient identified by MRN, date of birth, ID band Patient awake    Reviewed: Allergy & Precautions, NPO status , Patient's Chart, lab work & pertinent test results  Airway Mallampati: II  TM Distance: >3 FB Neck ROM: Limited    Dental no notable dental hx. (+) Teeth Intact, Caps, Missing   Pulmonary asthma , sleep apnea , former smoker, PE   Pulmonary exam normal breath sounds clear to auscultation       Cardiovascular Exercise Tolerance: Good hypertension, Pt. on medications + angina with exertion + CAD, + Cardiac Stents, + DOE and + DVT  Normal cardiovascular exam Rhythm:Regular Rate:Normal  Cardiac Cath 06/10/2018  Prox RCA lesion is 25% stenosed.  Ost 1st Mrg lesion is 50% stenosed.  Prox Cx to Mid Cx lesion is 40% stenosed.  Ost 2nd Mrg lesion is 40% stenosed.  2nd Mrg lesion is 50% stenosed.  Prox LAD lesion is 30% stenosed.  Ost 1st Diag lesion is 75% stenosed.  Mid LAD lesion is 90% stenosed.  A drug-eluting stent was successfully placed using a STENT ORSIRO 2.75X18.  Post intervention, there is a 0% residual stenosis.  LV end diastolic pressure is normal.  There is no aortic valve stenosis  EKG 08/05/19 NSR, PVCs, possible inferior MI  Echo 01/16/18 Left ventricle: The cavity size was normal. Wall thickness wasincreased in a pattern of moderate LVH. There was focal basalhypertrophy. Systolic function was normal. The estimated ejectionfraction was in the range of 60% to 65%. Wall motion was normal;there were no regional wall motion abnormalities. Dopplerparameters are consistent with abnormal left ventricularrelaxation (grade 1 diastolic dysfunction).   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  Neuromuscular disease CVA, Residual Symptoms    GI/Hepatic Neg liver ROS, GERD  Medicated and Controlled,Hx/o esophageal stricture    Endo/Other  Obesity Hyperlipidemia Gout  Renal/GU Renal InsufficiencyRenal disease   ED negative genitourinary   Musculoskeletal  (+) Arthritis , Osteoarthritis,  Impingement syndrome left shoulder Biceps tendinosis left  Cervical DDD with myelopathy Cervical stenosis   Abdominal (+) + obese,   Peds  Hematology  (+) anemia , Anticoagulant use- Eliquis and Plavix last dose 3 days ago and 5 days ago   Anesthesia Other Findings   Reproductive/Obstetrics                          Anesthesia Physical Anesthesia Plan  ASA: III  Anesthesia Plan: General   Post-op Pain Management:  Regional for Post-op pain   Induction: Intravenous  PONV Risk Score and Plan: 3 and Ondansetron, Dexamethasone and Treatment may vary due to age or medical condition  Airway Management Planned: Oral ETT and Video Laryngoscope Planned  Additional Equipment:   Intra-op Plan:   Post-operative Plan: Extubation in OR  Informed Consent: I have reviewed the patients History and Physical, chart, labs and discussed the procedure including the risks, benefits and alternatives for the proposed anesthesia with the patient or authorized representative who has indicated his/her understanding and acceptance.     Dental advisory given  Plan Discussed with: CRNA and Surgeon  Anesthesia Plan Comments: (Follows with cardiology for hx of DVT with recurrent PE (first in 1981, none in 5 years), on Eliquis, hx of CVA, CAD with PCI of the LAD, on ( was on Plavix, recently cleared to dc). Last seen 4/5/21and discussed that pt was cleared for upcoming shoulder surgery. Per note, "He may hold Plavix  for 5 days prior to left shoulder arthroscopy and begin again as soon as possible thereafter.  He is currently asymptomatic and cleared to move forward with his procedure."  Per telephone encounter 02/24/20, Dr. Ellyn Hack cleared pt to discontinue Plavix indefinitely and resume 81 mg ASA in its  place.  Preop labs reviewed, mildly elevated creatinine 1.36, baseline appears to be ~1.35. Labs otherwise unremarkable.   EKG 08/04/20: Sinus rhythm with occasional PVCs. Rate 74. Cannot rule out inferior infarct, age undetermined.   LHC 06/10/2018 Prox RCA lesion is 25% stenosed. Ost 1st Mrg lesion is 50% stenosed. Prox Cx to Mid Cx lesion is 40% stenosed. Ost 2nd Mrg lesion is 40% stenosed. 2nd Mrg lesion is 50% stenosed. Prox LAD lesion is 30% stenosed. Ost 1st Diag lesion is 75% stenosed. Mid LAD lesion is 90% stenosed. A drug-eluting stent was successfully placed using a STENT ORSIRO 2.75X18. Post intervention, there is a 0% residual stenosis. LV end diastolic pressure is normal.  Echocardiogram 01/16/2018 Left ventricle: The cavity size was normal. Wall thickness was  increased in a pattern of moderate LVH. There was focal basal  hypertrophy. Systolic function was normal. The estimated ejection  fraction was in the range of 60% to 65%. Wall motion was normal;  there were no regional wall motion abnormalities. Doppler  parameters are consistent with abnormal left ventricular  relaxation (grade 1 diastolic dysfunction).  )     Anesthesia Quick Evaluation

## 2020-03-03 ENCOUNTER — Ambulatory Visit (HOSPITAL_COMMUNITY): Payer: Medicare Other | Admitting: Physician Assistant

## 2020-03-03 ENCOUNTER — Ambulatory Visit (HOSPITAL_COMMUNITY)
Admission: RE | Admit: 2020-03-03 | Discharge: 2020-03-03 | Disposition: A | Discharge status: Home / Self Care | Payer: Medicare Other | Attending: Orthopedic Surgery | Admitting: Orthopedic Surgery

## 2020-03-03 ENCOUNTER — Ambulatory Visit (HOSPITAL_COMMUNITY): Payer: Medicare Other | Admitting: Anesthesiology

## 2020-03-03 ENCOUNTER — Encounter (HOSPITAL_COMMUNITY)
Admission: RE | Disposition: A | Discharge status: Home / Self Care | Payer: Self-pay | Source: Home / Self Care | Attending: Orthopedic Surgery

## 2020-03-03 ENCOUNTER — Encounter (HOSPITAL_COMMUNITY): Payer: Self-pay | Admitting: Orthopedic Surgery

## 2020-03-03 ENCOUNTER — Other Ambulatory Visit: Payer: Self-pay

## 2020-03-03 DIAGNOSIS — S43432A Superior glenoid labrum lesion of left shoulder, initial encounter: Secondary | ICD-10-CM | POA: Diagnosis not present

## 2020-03-03 DIAGNOSIS — I2510 Atherosclerotic heart disease of native coronary artery without angina pectoris: Secondary | ICD-10-CM | POA: Insufficient documentation

## 2020-03-03 DIAGNOSIS — Z79899 Other long term (current) drug therapy: Secondary | ICD-10-CM | POA: Insufficient documentation

## 2020-03-03 DIAGNOSIS — M7522 Bicipital tendinitis, left shoulder: Secondary | ICD-10-CM | POA: Insufficient documentation

## 2020-03-03 DIAGNOSIS — G4733 Obstructive sleep apnea (adult) (pediatric): Secondary | ICD-10-CM | POA: Diagnosis not present

## 2020-03-03 DIAGNOSIS — Z7901 Long term (current) use of anticoagulants: Secondary | ICD-10-CM | POA: Diagnosis not present

## 2020-03-03 DIAGNOSIS — M75112 Incomplete rotator cuff tear or rupture of left shoulder, not specified as traumatic: Secondary | ICD-10-CM | POA: Insufficient documentation

## 2020-03-03 DIAGNOSIS — F419 Anxiety disorder, unspecified: Secondary | ICD-10-CM | POA: Diagnosis not present

## 2020-03-03 DIAGNOSIS — J45909 Unspecified asthma, uncomplicated: Secondary | ICD-10-CM | POA: Diagnosis not present

## 2020-03-03 DIAGNOSIS — Z8673 Personal history of transient ischemic attack (TIA), and cerebral infarction without residual deficits: Secondary | ICD-10-CM | POA: Insufficient documentation

## 2020-03-03 DIAGNOSIS — M19012 Primary osteoarthritis, left shoulder: Secondary | ICD-10-CM | POA: Insufficient documentation

## 2020-03-03 DIAGNOSIS — Z86711 Personal history of pulmonary embolism: Secondary | ICD-10-CM | POA: Diagnosis not present

## 2020-03-03 DIAGNOSIS — Z85828 Personal history of other malignant neoplasm of skin: Secondary | ICD-10-CM | POA: Diagnosis not present

## 2020-03-03 DIAGNOSIS — I10 Essential (primary) hypertension: Secondary | ICD-10-CM | POA: Diagnosis not present

## 2020-03-03 DIAGNOSIS — Z885 Allergy status to narcotic agent status: Secondary | ICD-10-CM | POA: Diagnosis not present

## 2020-03-03 DIAGNOSIS — M25812 Other specified joint disorders, left shoulder: Secondary | ICD-10-CM | POA: Diagnosis not present

## 2020-03-03 DIAGNOSIS — M7542 Impingement syndrome of left shoulder: Secondary | ICD-10-CM | POA: Diagnosis not present

## 2020-03-03 DIAGNOSIS — E669 Obesity, unspecified: Secondary | ICD-10-CM | POA: Insufficient documentation

## 2020-03-03 DIAGNOSIS — G8929 Other chronic pain: Secondary | ICD-10-CM | POA: Insufficient documentation

## 2020-03-03 DIAGNOSIS — Z6832 Body mass index (BMI) 32.0-32.9, adult: Secondary | ICD-10-CM | POA: Insufficient documentation

## 2020-03-03 DIAGNOSIS — Z905 Acquired absence of kidney: Secondary | ICD-10-CM | POA: Insufficient documentation

## 2020-03-03 DIAGNOSIS — Z87891 Personal history of nicotine dependence: Secondary | ICD-10-CM | POA: Insufficient documentation

## 2020-03-03 DIAGNOSIS — E785 Hyperlipidemia, unspecified: Secondary | ICD-10-CM | POA: Diagnosis not present

## 2020-03-03 DIAGNOSIS — G8918 Other acute postprocedural pain: Secondary | ICD-10-CM | POA: Diagnosis not present

## 2020-03-03 DIAGNOSIS — Z955 Presence of coronary angioplasty implant and graft: Secondary | ICD-10-CM | POA: Insufficient documentation

## 2020-03-03 DIAGNOSIS — M75102 Unspecified rotator cuff tear or rupture of left shoulder, not specified as traumatic: Secondary | ICD-10-CM | POA: Diagnosis not present

## 2020-03-03 DIAGNOSIS — M109 Gout, unspecified: Secondary | ICD-10-CM | POA: Diagnosis not present

## 2020-03-03 LAB — PROTIME-INR
INR: 1.1 (ref 0.8–1.2)
Prothrombin Time: 14 seconds (ref 11.4–15.2)

## 2020-03-03 SURGERY — SHOULDER ARTHROSCOPY WITH SUBACROMIAL DECOMPRESSION AND DISTAL CLAVICLE EXCISION
Anesthesia: General | Laterality: Left

## 2020-03-03 MED ORDER — BUPIVACAINE-EPINEPHRINE (PF) 0.5% -1:200000 IJ SOLN
INTRAMUSCULAR | Status: DC | PRN
Start: 2020-03-03 — End: 2020-03-03
  Administered 2020-03-03: 20 mL via PERINEURAL

## 2020-03-03 MED ORDER — PROPOFOL 10 MG/ML IV BOLUS
INTRAVENOUS | Status: AC
  Filled 2020-03-03: qty 20

## 2020-03-03 MED ORDER — BUPIVACAINE LIPOSOME 1.3 % IJ SUSP
INTRAMUSCULAR | Status: DC | PRN
  Administered 2020-03-03: 10 mL via PERINEURAL

## 2020-03-03 MED ORDER — FENTANYL CITRATE (PF) 100 MCG/2ML IJ SOLN: 25.0000 ug | INTRAMUSCULAR | Status: DC | PRN

## 2020-03-03 MED ORDER — LACTATED RINGERS IV SOLN: INTRAVENOUS | Status: DC | PRN

## 2020-03-03 MED ORDER — MIDAZOLAM HCL 2 MG/2ML IJ SOLN
INTRAMUSCULAR | Status: AC
  Filled 2020-03-03: qty 2

## 2020-03-03 MED ORDER — SODIUM CHLORIDE 0.9 % IR SOLN
Status: DC | PRN
  Administered 2020-03-03: 6000 mL

## 2020-03-03 MED ORDER — ONDANSETRON HCL 4 MG/2ML IJ SOLN: 4.0000 mg | Freq: Once | INTRAMUSCULAR | Status: DC | PRN

## 2020-03-03 MED ORDER — PHENYLEPHRINE 40 MCG/ML (10ML) SYRINGE FOR IV PUSH (FOR BLOOD PRESSURE SUPPORT)
PREFILLED_SYRINGE | INTRAVENOUS | Status: DC | PRN
  Administered 2020-03-03: 80 ug via INTRAVENOUS
  Administered 2020-03-03: 120 ug via INTRAVENOUS
  Administered 2020-03-03 (×2): 80 ug via INTRAVENOUS

## 2020-03-03 MED ORDER — CEFAZOLIN SODIUM-DEXTROSE 2-4 GM/100ML-% IV SOLN
2.0000 g | INTRAVENOUS | Status: AC
  Administered 2020-03-03: 2 g via INTRAVENOUS
  Filled 2020-03-03: qty 100

## 2020-03-03 MED ORDER — PROPOFOL 10 MG/ML IV BOLUS
INTRAVENOUS | Status: DC | PRN
  Administered 2020-03-03: 150 mg via INTRAVENOUS

## 2020-03-03 MED ORDER — FENTANYL CITRATE (PF) 250 MCG/5ML IJ SOLN
INTRAMUSCULAR | Status: AC
  Filled 2020-03-03: qty 5

## 2020-03-03 MED ORDER — ROCURONIUM BROMIDE 10 MG/ML (PF) SYRINGE
PREFILLED_SYRINGE | INTRAVENOUS | Status: DC | PRN
  Administered 2020-03-03: 60 mg via INTRAVENOUS

## 2020-03-03 MED ORDER — SUGAMMADEX SODIUM 200 MG/2ML IV SOLN
INTRAVENOUS | Status: DC | PRN
  Administered 2020-03-03: 200 mg via INTRAVENOUS

## 2020-03-03 MED ORDER — ONDANSETRON HCL 4 MG PO TABS: 4.0000 mg | ORAL_TABLET | Freq: Three times a day (TID) | ORAL | 0 refills | Status: DC | PRN

## 2020-03-03 MED ORDER — ONDANSETRON HCL 4 MG/2ML IJ SOLN
INTRAMUSCULAR | Status: DC | PRN
  Administered 2020-03-03: 4 mg via INTRAVENOUS

## 2020-03-03 MED ORDER — DEXAMETHASONE SODIUM PHOSPHATE 10 MG/ML IJ SOLN
INTRAMUSCULAR | Status: DC | PRN
  Administered 2020-03-03: 5 mg via INTRAVENOUS

## 2020-03-03 MED ORDER — MEPERIDINE HCL 25 MG/ML IJ SOLN: 6.2500 mg | INTRAMUSCULAR | Status: DC | PRN

## 2020-03-03 MED ORDER — LIDOCAINE 2% (20 MG/ML) 5 ML SYRINGE
INTRAMUSCULAR | Status: DC | PRN
  Administered 2020-03-03: 80 mg via INTRAVENOUS

## 2020-03-03 MED ORDER — PHENYLEPHRINE HCL-NACL 10-0.9 MG/250ML-% IV SOLN
INTRAVENOUS | Status: DC | PRN
  Administered 2020-03-03: 60 ug/min via INTRAVENOUS

## 2020-03-03 MED ORDER — OXYCODONE HCL 5 MG PO TABS: 5.0000 mg | ORAL_TABLET | Freq: Four times a day (QID) | ORAL | 0 refills | Status: DC | PRN

## 2020-03-03 MED ORDER — FENTANYL CITRATE (PF) 250 MCG/5ML IJ SOLN
INTRAMUSCULAR | Status: DC | PRN
  Administered 2020-03-03 (×3): 50 ug via INTRAVENOUS

## 2020-03-03 MED ORDER — MIDAZOLAM HCL 2 MG/2ML IJ SOLN
INTRAMUSCULAR | Status: DC | PRN
  Administered 2020-03-03 (×2): 1 mg via INTRAVENOUS

## 2020-03-03 SURGICAL SUPPLY — 33 items
BLADE SURG 11 STRL SS (BLADE) ×2 IMPLANT
BURR OVAL 8 FLU 4.0X13 (MISCELLANEOUS) ×1 IMPLANT
CANNULA TWIST IN 8.25X7CM (CANNULA) ×2 IMPLANT
DISSECTOR  3.8MM X 13CM (MISCELLANEOUS) ×2
DISSECTOR 3.8MM X 13CM (MISCELLANEOUS) IMPLANT
DRAPE STERI 35X30 U-POUCH (DRAPES) ×2 IMPLANT
DRAPE U-SHAPE 47X51 STRL (DRAPES) ×2 IMPLANT
DRSG PAD ABDOMINAL 8X10 ST (GAUZE/BANDAGES/DRESSINGS) ×4 IMPLANT
DURAPREP 26ML APPLICATOR (WOUND CARE) ×2 IMPLANT
GAUZE SPONGE 4X4 12PLY STRL (GAUZE/BANDAGES/DRESSINGS) ×2 IMPLANT
GAUZE XEROFORM 1X8 LF (GAUZE/BANDAGES/DRESSINGS) ×1 IMPLANT
GLOVE BIO SURGEON STRL SZ7.5 (GLOVE) ×2 IMPLANT
GLOVE BIOGEL PI IND STRL 8 (GLOVE) ×1 IMPLANT
GLOVE BIOGEL PI INDICATOR 8 (GLOVE) ×1
GOWN STRL REUS W/ TWL LRG LVL3 (GOWN DISPOSABLE) ×2 IMPLANT
GOWN STRL REUS W/TWL LRG LVL3 (GOWN DISPOSABLE) ×4
KIT BASIN OR (CUSTOM PROCEDURE TRAY) ×2 IMPLANT
KIT TURNOVER KIT B (KITS) ×2 IMPLANT
MANIFOLD NEPTUNE II (INSTRUMENTS) ×2 IMPLANT
NDL SPNL 18GX3.5 QUINCKE PK (NEEDLE) ×1 IMPLANT
NEEDLE SPNL 18GX3.5 QUINCKE PK (NEEDLE) ×2 IMPLANT
NS IRRIG 1000ML POUR BTL (IV SOLUTION) ×2 IMPLANT
PACK SHOULDER (CUSTOM PROCEDURE TRAY) ×2 IMPLANT
PAD ARMBOARD 7.5X6 YLW CONV (MISCELLANEOUS) ×4 IMPLANT
PROBE APOLLO 90XL (SURGICAL WAND) ×1 IMPLANT
SLEEVE ARM SUSPENSION SYSTEM (MISCELLANEOUS) ×2 IMPLANT
SLING ARM FOAM STRAP LRG (SOFTGOODS) ×1 IMPLANT
SLING S3 LATERAL DISP (MISCELLANEOUS) ×1 IMPLANT
SUT ETHILON 3 0 PS 1 (SUTURE) ×2 IMPLANT
TAPE PAPER 3X10 WHT MICROPORE (GAUZE/BANDAGES/DRESSINGS) ×2 IMPLANT
TOWEL GREEN STERILE (TOWEL DISPOSABLE) ×2 IMPLANT
TOWEL GREEN STERILE FF (TOWEL DISPOSABLE) ×2 IMPLANT
TUBING ARTHROSCOPY IRRIG 16FT (MISCELLANEOUS) ×2 IMPLANT

## 2020-03-03 NOTE — Anesthesia Procedure Notes (Signed)
Procedure Name: Intubation Date/Time: 03/03/2020 7:38 AM Performed by: Janace Litten, CRNA Pre-anesthesia Checklist: Patient identified, Emergency Drugs available, Suction available and Patient being monitored Patient Re-evaluated:Patient Re-evaluated prior to induction Oxygen Delivery Method: Circle System Utilized Preoxygenation: Pre-oxygenation with 100% oxygen Induction Type: IV induction Ventilation: Mask ventilation without difficulty and Oral airway inserted - appropriate to patient size Laryngoscope Size: Glidescope and 4 Grade View: Grade I Tube type: Oral Tube size: 7.5 mm Number of attempts: 1 Airway Equipment and Method: Stylet and Oral airway Placement Confirmation: ETT inserted through vocal cords under direct vision,  positive ETCO2 and breath sounds checked- equal and bilateral Secured at: 22 cm Tube secured with: Tape Dental Injury: Teeth and Oropharynx as per pre-operative assessment  Comments: Elective glidescope due to significant cervical neuropathies

## 2020-03-03 NOTE — Anesthesia Postprocedure Evaluation (Signed)
Anesthesia Post Note  Patient: [name]  Procedure(s) Performed: SHOULDER ARTHROSCOPY WITH EXTENSIVE DEBRIDEMENT,  SUBACROMIAL DECOMPRESSION AND DISTAL CLAVICLE RESECTION (Left )     Patient location during evaluation: PACU Anesthesia Type: General Level of consciousness: awake and alert Pain management: pain level controlled Vital Signs Assessment: post-procedure vital signs reviewed and stable Respiratory status: spontaneous breathing, nonlabored ventilation and respiratory function stable Cardiovascular status: blood pressure returned to baseline and stable Postop Assessment: no apparent nausea or vomiting Anesthetic complications: no    Last Vitals:  Vitals:   03/03/20 0905 03/03/20 0914  BP: (!) 141/85 137/90  Pulse: 84 90  Resp: 13 17  Temp:  36.5 C  SpO2: 94% 94%    Last Pain:  Vitals:   03/03/20 0914  TempSrc:   PainSc: 0-No pain                 Sylver Vantassell A.

## 2020-03-03 NOTE — Brief Op Note (Signed)
03/03/2020  8:33 AM  PATIENT:  [name]  79 y.o. male  PRE-OPERATIVE DIAGNOSIS:  Left shoulder impingement, bursitis, biceps tendinitis  POST-OPERATIVE DIAGNOSIS:  Left shoulder impingement, bursitis, Degenerative labrum tearing, acromioclavicluar osteoarthritis.  PROCEDURE:  Procedure(s) with comments: SHOULDER ARTHROSCOPY WITH EXTENSIVE DEBRIDEMENT,  SUBACROMIAL DECOMPRESSION AND DISTAL CLAVICLE RESECTION (Left) - 90 mins  SURGEON:  Surgeon(s) and Role:    * Stann Mainland, Elly Modena, MD - Primary  PHYSICIAN ASSISTANT:   ASSISTANTS: Katy Apo, RNFA   ANESTHESIA:   regional and epidural  EBL:   10 cc  BLOOD ADMINISTERED:none  DRAINS: none   LOCAL MEDICATIONS USED:  NONE  SPECIMEN:  No Specimen  DISPOSITION OF SPECIMEN:  N/A  COUNTS:  YES  TOURNIQUET:  * No tourniquets in log *  DICTATION: .Note written in EPIC  PLAN OF CARE: Discharge to home after PACU  PATIENT DISPOSITION:  PACU - hemodynamically stable.   Delay start of Pharmacological VTE agent (>24hrs) due to surgical blood loss or risk of bleeding: resume 81 mg asa

## 2020-03-03 NOTE — H&P (Signed)
ORTHOPAEDIC H and P  REQUESTING PHYSICIAN: Nicholes Stairs, MD  PCP:  Harlan Stains, MD  Chief Complaint: Chronic left shoulder pain  HPI: [name] is a 79 y.o. male who complains of increasing left shoulder pain with decreasing response to conservative measures.  He is here today for left shoulder arthroscopy.  No new complaints at this time.  Past Medical History:  Diagnosis Date  . Anxiety   . Arthritis    KNEES, NECK, SHOULDERS, HANDS  . Asthma   . Biceps tendon tear    right  . Cancer ([place])    skin cancer-  2 basal cell, 1 squamous cell  . Coronary artery disease   . GERD (gastroesophageal reflux disease)   . Gout, arthritis    BILATERAL GREAT TOE-- PER PT STABLE  . H/O transfusion of packed red blood cells    as a child/ s/p tonsillectomy  . History of pulmonary embolism    Maintained on lifelong anticoagulation with Eliquis.  . Lung nodule    BENIGN RIGHT UPPER--  CHEST CT 08-22-2011  . OSA (obstructive sleep apnea)    NO CPAP -states dr said possibly related to asthma  . S/P dilatation of esophageal stricture   . Stroke ([place])   . Torn rotator cuff    Right x 3   Past Surgical History:  Procedure Laterality Date  . CARPAL TUNNEL RELEASE Right 2010  . CORONARY CALCIUM SCORING WITH CT CORONARY ANGIOGRAM  05/29/2018   Coronary calcium score 416.  Possible obstructive CAD in the mid circumflex-OM1-OM 2, D2 and a moderate disease in the proximal LAD -->  FFR --> LAD: Proximal.90, mid.86 distal.68 D2.80, Cx: Mid.76 OM2.74: Hemodynamically significant distal LAD, D2, mid circumflex and OM2 lesions  . CORONARY STENT INTERVENTION N/A 06/10/2018   Procedure: CORONARY STENT INTERVENTION;  Surgeon: Jettie Booze, MD;  Location: [facility] INVASIVE CV LAB;;  90% mLAD --> DES PCI (ORSIRO 2.75X18) => 0%  . JOINT REPLACEMENT    . LEFT HEART CATH AND CORONARY ANGIOGRAPHY N/A 06/10/2018   Procedure: LEFT HEART CATH AND CORONARY ANGIOGRAPHY;  Surgeon: Jettie Booze, MD;  Location: [facility] INVASIVE CV LAB;;  90% mLAD --> DES PCI.  pRCA 25%. p-m Cx 40%. Ost OM1 50%, ost OM2 40% with 50% mid.  pLAD 30% & ost D1 75 % (med Rx).  Normal LVEDP.   [name] NEPHRECTOMY    . SHOULDER ARTHROSCOPY Right   . TONSILLECTOMY  AGE 14  . TOTAL KNEE ARTHROPLASTY Left 10-15-2008  . TOTAL KNEE ARTHROPLASTY Right 01/18/2014   Procedure: RIGHT TOTAL KNEE ARTHROPLASTY;  Surgeon: Gearlean Alf, MD;  Location: [facility];  Service: Orthopedics;  Laterality: Right;  . TRANSTHORACIC ECHOCARDIOGRAM  01/2018    Normal LV size with moderate LVH.  EF 60 to 65% with GR 1 DD (normal for age)  . WRIST GANGLION EXCISION Right 1964   Social History   Socioeconomic History  . Marital status: Married    Spouse name: Not on file  . Number of children: 4  . Years of education: some master's classes  . Highest education level: Bachelor's degree (e.g., BA, AB, BS)  Occupational History  . Occupation: CNA  Tobacco Use  . Smoking status: Former Smoker    Packs/day: 1.00    Years: 25.00    Pack years: 25.00    Types: Cigarettes    Quit date: 07/13/1980    Years since quitting: 39.6  . Smokeless tobacco: Never Used  Substance and Sexual Activity  . Alcohol use: Yes    Comment: RARE  . Drug use: No  . Sexual activity: Yes    Partners: Female  Other Topics Concern  . Not on file  Social History Narrative   Lives at home with his wife & youngest daughter and her family   Right handed   Drinks 2 cups of coffee daily, occasional cup of tea   Social Determinants of Health   Financial Resource Strain:   . Difficulty of Paying Living Expenses:   Food Insecurity:   . Worried About Charity fundraiser in the Last Year:   . Arboriculturist in the Last Year:   Transportation Needs:   . Film/video editor (Medical):   [name] Lack of Transportation (Non-Medical):   Physical Activity:   . Days of Exercise per Week:   . Minutes of Exercise per Session:   Stress:   . Feeling of Stress :    Social Connections:   . Frequency of Communication with Friends and Family:   . Frequency of Social Gatherings with Friends and Family:   . Attends Religious Services:   . Active Member of Clubs or Organizations:   . Attends Archivist Meetings:   [name] Marital Status:    Family History  Problem Relation Age of Onset  . Deep vein thrombosis Father   . Stroke Father   . Stroke Brother   . Colon cancer Mother   . Migraines Neg Hx    Allergies  Allergen Reactions  . Garlic Anaphylaxis  . Onion Anaphylaxis  . Codeine Other (See Comments)    "PASSES OUT"  . Hydrocodone     Pt states that he is allergic to synthetic hydrocodone.  . Novocain [Procaine] Nausea Only   Prior to Admission medications   Medication Sig Start Date End Date Taking? Authorizing Provider  allopurinol (ZYLOPRIM) 300 MG tablet Take 300 mg by mouth daily.   Yes [provider]  apixaban (ELIQUIS) 5 MG TABS tablet Take 5 mg by mouth 2 (two) times daily.   Yes [provider]  clopidogrel (PLAVIX) 75 MG tablet TAKE 1 TABLET BY MOUTH ONCE A DAY WITH BREAKFAST Patient taking differently: Take 75 mg by mouth daily.  11/30/19  Yes Leonie Man, MD  escitalopram (LEXAPRO) 20 MG tablet Take 20 mg by mouth daily.    Yes [provider]  gabapentin (NEURONTIN) 300 MG capsule Take 300 mg by mouth 3 (three) times daily.   Yes [provider]  methocarbamol (ROBAXIN) 750 MG tablet Take 750 mg by mouth 3 (three) times daily.   Yes [provider]  Multiple Vitamin (MULTIVITAMIN WITH MINERALS) TABS tablet Take 1 tablet by mouth daily.   Yes [provider]  nitroGLYCERIN (NITROSTAT) 0.4 MG SL tablet Place 1 tablet (0.4 mg total) under the tongue every 5 (five) minutes as needed for chest pain. 06/12/18  Yes Leonie Man, MD  Probiotic Product (PROBIOTIC DAILY PO) Take 1 capsule by mouth daily.   Yes [provider]  rosuvastatin (CRESTOR) 20 MG tablet Take  1 tablet (20 mg total) by mouth at bedtime. Patient taking differently: Take 10 mg by mouth at bedtime.  02/15/20  Yes Lendon Colonel, NP  tadalafil (CIALIS) 20 MG tablet Take 20 mg by mouth daily as needed for erectile dysfunction.    Yes [provider]  tadalafil (CIALIS) 5 MG tablet Take 5 mg by mouth daily.  12/21/19  Yes [provider]  testosterone cypionate (DEPOTESTOSTERONE CYPIONATE) 200 MG/ML injection Inject 200 mg into the muscle once a week. 1 ml weekly 01/14/20  Yes [provider]  traMADol-acetaminophen (ULTRACET) 37.5-325 MG tablet Take 1 tablet by mouth in the morning, at noon, and at bedtime.   Yes [provider]  valsartan (DIOVAN) 320 MG tablet Take 320 mg by mouth daily.   Yes [provider]   No results found.  Positive ROS: All other systems have been reviewed and were otherwise negative with the exception of those mentioned in the HPI and as above.  Physical Exam: General: Alert, no acute distress Cardiovascular: No pedal edema Respiratory: No cyanosis, no use of accessory musculature GI: No organomegaly, abdomen is soft and non-tender Skin: No lesions in the area of chief complaint Neurologic: Sensation intact distally Psychiatric: Patient is competent for consent with normal mood and affect Lymphatic: No axillary or cervical lymphadenopathy  MUSCULOSKELETAL:  Left shoulder skin is warm and well-perfused no open wounds.  Distally neurovascularly intact  Assessment: 1. Left shoulder impingement  2.  Left shoulder acromioclavicular arthritis  3.  Left shoulder biceps tendinitis  Plan: Alvester Chou and I again discussed his chronic symptoms as well as her plan for arthroscopic surgery of the left shoulder.  We discussed the risk of bleeding, infection, damage to surrounding neurovascular structures, development of arthritis, stiffness, failure of pain relief, DVT and the risk of anesthesia.  He has provided informed  consent.  -We will plan for discharge home from PACU postoperatively.    Nicholes Stairs, MD Cell [phone]    03/03/2020 7:13 AM

## 2020-03-03 NOTE — Anesthesia Procedure Notes (Signed)
Anesthesia Regional Block: Interscalene brachial plexus block   Pre-Anesthetic Checklist: ,, timeout performed, Correct Patient, Correct Site, Correct Laterality, Correct Procedure, Correct Position, site marked, Risks and benefits discussed,  Surgical consent,  Pre-op evaluation,  At surgeon's request and post-op pain management  Laterality: Left  Prep: chloraprep       Needles:  Injection technique: Single-shot  Needle Type: Echogenic Stimulator Needle     Needle Length: 9cm  Needle Gauge: 21   Needle insertion depth: 7 cm   Additional Needles:   Procedures:,,,, ultrasound used (permanent image in chart),,,,  Narrative:  Start time: 03/03/2020 7:01 AM End time: 03/03/2020 7:07 AM Injection made incrementally with aspirations every 5 mL.  Performed by: Personally  Anesthesiologist: Josephine Igo, MD  Additional Notes: Timeout performed. Patient sedated. Relevant anatomy ID'd using [place]. Incremental 2-37ml injection of LA with frequent aspiration. Patient tolerated procedure well.        Left Interscalene Block

## 2020-03-03 NOTE — Transfer of Care (Signed)
Immediate Anesthesia Transfer of Care Note  Patient: Diana Gershon Schroepfer  Procedure(s) Performed: SHOULDER ARTHROSCOPY WITH EXTENSIVE DEBRIDEMENT,  SUBACROMIAL DECOMPRESSION AND DISTAL CLAVICLE RESECTION (Left )  Patient Location: PACU  Anesthesia Type:GA combined with regional for post-op pain  Level of Consciousness: awake, alert  and patient cooperative  Airway & Oxygen Therapy: Patient Spontanous Breathing and Patient connected to nasal cannula oxygen  Post-op Assessment: Report given to RN and Post -op Vital signs reviewed and stable  Post vital signs: Reviewed and stable  Last Vitals:  Vitals Value Taken Time  BP 163/90 03/03/20 0850  Temp 36.6 C 03/03/20 0850  Pulse 87 03/03/20 0851  Resp 17 03/03/20 0851  SpO2 94 % 03/03/20 0851  Vitals shown include unvalidated device data.  Last Pain:  Vitals:   03/03/20 0850  TempSrc:   PainSc: (P) 0-No pain      Patients Stated Pain Goal: 3 ([id number])  Complications: No apparent anesthesia complications

## 2020-03-03 NOTE — Op Note (Signed)
Date of Surgery: 03/03/2020  INDICATIONS: [name] is a 79 y.o.-year-old male with a left shoulder impingement as well as symptomatic acromioclavicular joint arthritis.  He has had exhaustive conservative treatment with subacromial injections as well as outpatient therapy and oral medications.  He presents today for arthroscopic surgery with debridements and decompression as well as distal clavicle resection.;  The patient did consent to the procedure after discussion of the risks and benefits.  PREOPERATIVE DIAGNOSIS:  1.  Left shoulder impingement 2.  Left shoulder acromioclavicular arthritis 3.  Left shoulder partial rotator cuff tear 4.  Left shoulder biceps tendinitis  POSTOPERATIVE DIAGNOSIS:  1.  Left shoulder impingement 2.  Left shoulder acromioclavicular arthritis 3.  Left shoulder partial rotator cuff tear 4.  Left shoulder biceps long head rupture 5.  Left shoulder degenerative labrum tear anterior, superior, and posterior.   PROCEDURE:  1.  Left shoulder arthroscopic extensive debridement including rotator interval, anterior labrum, superior labrum, posterior labrum, glenoid fossa, and supraspinatus tendon on the bursal side. 2.  Left shoulder arthroscopic subacromial decompression without coracoacromial ligament release 3.  Distal clavicle resection including 1.0 cm of resection.  SURGEON: Geralynn Rile, M.D.  ASSIST: Katy Apo, RNFA.  ANESTHESIA:  general  IV FLUIDS AND URINE: See anesthesia.  ESTIMATED BLOOD LOSS: 10 mL.  IMPLANTS: None  DRAINS: None  COMPLICATIONS: None.  DESCRIPTION OF PROCEDURE: The patient was brought to the operating room and placed supine on the operating table.  The patient had been signed prior to the procedure and this was documented. The patient had the anesthesia placed by the anesthesiologist.  Following confirmation of the correct patient and procedure as well as confirmation of preoperative antibiotics being dosed we then  proceeded to turn the patient into the right lateral decubitus position.  The left upper extremity was hanging in a inline traction device.  The arm was then prepped and draped in standard sterile fashion.  A time-out was performed to confirm that this was the correct patient, site, side and location. The patient did receive antibiotics prior to the incision and was re-dosed during the procedure as needed at indicated intervals.  A tourniquet was not placed.  The patient had the operative extremity prepped and draped in the standard surgical fashion.      We began the procedure with diagnostic arthroscopy.  A standard posterior viewing portal was established 2 cm inferior and 1 cm medial to the posterior lateral corner of the acromium.  Once we introduced the camera into the joint we established a mid glenoid working portal..  This was accomplished via a direct spinal needle localization.  We then entered the joint bluntly.  On diagnostic arthroscopy we noted spontaneous rupture of the long head of the biceps.  There was degenerative tearing with flap tears of the anterior, superior, and posterior labrum.  Glenohumeral joint was without arthritis.  The subscapularis was intact.  Teres minor was intact.  Infraspinatus had partial articular sided tearing of the supraspinatus had anterior leading edge small full-thickness tear.  No loose bodies.  In the subacromial space there was extensive scuffing of the CA ligament.  There was exhaustive bursitis.  The acromial had a large type III anterior spur.  The acromioclavicular joint was degenerative with undersurface spur.  We began with the extensive debridement.  This was performed via motorized shaver through the mid glenoid portal initially.  We released the middle glenohumeral ligament and debrided the rotator interval.  We then debrided the flap tears  of the anterior, superior, and posterior labrum.  Thyroid fossa was debrided from a small area of chondral loss  in the mid portion.  The undersurface tear of the infraspinatus was likewise debrided.  We also performed extensive bursectomy and the subacromial space.  Next we introduced the camera into the subacromial space.  A working portal was created along the lateral aspect of the acromium at the midpoint of the [facility] joint approximately 4 cm lateral to the edge of the acromion.  Working through this we performed extensive bursectomy as well as periosteal dissection of the anterior lateral border of the acromion.  This did identify a type II morphology of the acromion.  We then worked from the posterior portal and viewed from the lateral portal to perform a subacromial decompression with the motorized bur.  Using a cutting block technique we resected the underhanging spur of the acromion.  When we completed the procedure there was a nice type I morphology.  Lastly, we moved to the distal clavicle resection.  Using the radiofrequency ablation wand through the mid anterior portal we completed a periosteal and capsular resection of the inferior and anterior capsule of the AC joint.  This then demonstrated a sclerotic and narrowed AC joint.  We then used the motorized bur to resect approximately 1 cm of distal clavicle.  Care was taken to preserve the superior and posterior capsule to prevent AC joint instability.  Pictures were taken throughout the case.  At the completion of the procedure all instruments were removed from the shoulder.  Fluid was allowed to egress from the shoulder.  We then closed portals with 3-0 nylon.  Standard sterile bandages were applied.  Patient tolerated the procedure well with no noted complications.  He was awakened from general anesthetic with no complications, and was transported to PACU in stable condition.  POSTOPERATIVE PLAN:  [name] will be weightbearing as tolerated to the left upper extremity.  He will be wearing his sling just for comfort.  He may remove the sling and use the arm as  needed.  Otherwise he will apply ice to the shoulder throughout the day for 30 minutes at a time.  I will see him back in the office in 2 weeks for routine postop check.  He may resume his preoperative aspirin immediately.

## 2020-03-03 NOTE — Discharge Instructions (Signed)
-  Maintain postoperative bandages for 3 days.  May remove on the third day and begin showering at that point but do not submerge underwater.  -Wear your sling for comfort only.  You may remove the sling and move the arm as tolerated.  -Apply ice to the left shoulder for 30 minutes at a time throughout the day.  She do this as frequently as possible.  -For mild to moderate pain use Tylenol and Advil around-the-clock.  For breakthrough pain use oxycodone as needed.  - you should resume your 81 mg daily aspirin regimen immediately  -Return to see Dr. Stann Mainland in 2 weeks for routine postop care.

## 2020-03-14 DIAGNOSIS — M25511 Pain in right shoulder: Secondary | ICD-10-CM | POA: Diagnosis not present

## 2020-03-14 DIAGNOSIS — M4726 Other spondylosis with radiculopathy, lumbar region: Secondary | ICD-10-CM | POA: Diagnosis not present

## 2020-03-14 DIAGNOSIS — M4722 Other spondylosis with radiculopathy, cervical region: Secondary | ICD-10-CM | POA: Diagnosis not present

## 2020-03-14 DIAGNOSIS — M4802 Spinal stenosis, cervical region: Secondary | ICD-10-CM | POA: Diagnosis not present

## 2020-03-18 ENCOUNTER — Telehealth: Payer: Self-pay | Admitting: Oncology

## 2020-03-18 DIAGNOSIS — Z4789 Encounter for other orthopedic aftercare: Secondary | ICD-10-CM | POA: Diagnosis not present

## 2020-03-18 DIAGNOSIS — M25811 Other specified joint disorders, right shoulder: Secondary | ICD-10-CM | POA: Diagnosis not present

## 2020-03-18 NOTE — Telephone Encounter (Signed)
Faxed Medical Records to [facility] at Fax: [phone]. Release [id number]

## 2020-05-05 DIAGNOSIS — F331 Major depressive disorder, recurrent, moderate: Secondary | ICD-10-CM | POA: Diagnosis not present

## 2020-05-05 DIAGNOSIS — I129 Hypertensive chronic kidney disease with stage 1 through stage 4 chronic kidney disease, or unspecified chronic kidney disease: Secondary | ICD-10-CM | POA: Diagnosis not present

## 2020-05-05 DIAGNOSIS — F419 Anxiety disorder, unspecified: Secondary | ICD-10-CM | POA: Diagnosis not present

## 2020-05-05 DIAGNOSIS — N183 Chronic kidney disease, stage 3 unspecified: Secondary | ICD-10-CM | POA: Diagnosis not present

## 2020-05-10 DIAGNOSIS — M4722 Other spondylosis with radiculopathy, cervical region: Secondary | ICD-10-CM | POA: Diagnosis not present

## 2020-05-10 DIAGNOSIS — M4802 Spinal stenosis, cervical region: Secondary | ICD-10-CM | POA: Diagnosis not present

## 2020-05-10 DIAGNOSIS — M4726 Other spondylosis with radiculopathy, lumbar region: Secondary | ICD-10-CM | POA: Diagnosis not present

## 2020-05-10 DIAGNOSIS — M25511 Pain in right shoulder: Secondary | ICD-10-CM | POA: Diagnosis not present

## 2020-05-15 DIAGNOSIS — S8011XA Contusion of right lower leg, initial encounter: Secondary | ICD-10-CM | POA: Diagnosis not present

## 2020-05-15 DIAGNOSIS — W19XXXA Unspecified fall, initial encounter: Secondary | ICD-10-CM | POA: Diagnosis not present

## 2020-05-15 DIAGNOSIS — Z7901 Long term (current) use of anticoagulants: Secondary | ICD-10-CM | POA: Diagnosis not present

## 2020-05-15 DIAGNOSIS — S80811A Abrasion, right lower leg, initial encounter: Secondary | ICD-10-CM | POA: Diagnosis not present

## 2020-05-19 DIAGNOSIS — S80811S Abrasion, right lower leg, sequela: Secondary | ICD-10-CM | POA: Diagnosis not present

## 2020-05-19 DIAGNOSIS — F331 Major depressive disorder, recurrent, moderate: Secondary | ICD-10-CM | POA: Diagnosis not present

## 2020-05-19 DIAGNOSIS — S8011XS Contusion of right lower leg, sequela: Secondary | ICD-10-CM | POA: Diagnosis not present

## 2020-05-19 DIAGNOSIS — Z7901 Long term (current) use of anticoagulants: Secondary | ICD-10-CM | POA: Diagnosis not present

## 2020-06-10 DIAGNOSIS — M25811 Other specified joint disorders, right shoulder: Secondary | ICD-10-CM | POA: Diagnosis not present

## 2020-06-10 DIAGNOSIS — M25511 Pain in right shoulder: Secondary | ICD-10-CM | POA: Diagnosis not present

## 2020-06-15 DIAGNOSIS — F411 Generalized anxiety disorder: Secondary | ICD-10-CM | POA: Diagnosis not present

## 2020-06-15 DIAGNOSIS — F332 Major depressive disorder, recurrent severe without psychotic features: Secondary | ICD-10-CM | POA: Diagnosis not present

## 2020-06-30 DIAGNOSIS — I129 Hypertensive chronic kidney disease with stage 1 through stage 4 chronic kidney disease, or unspecified chronic kidney disease: Secondary | ICD-10-CM | POA: Diagnosis not present

## 2020-06-30 DIAGNOSIS — N183 Chronic kidney disease, stage 3 unspecified: Secondary | ICD-10-CM | POA: Diagnosis not present

## 2020-06-30 DIAGNOSIS — F331 Major depressive disorder, recurrent, moderate: Secondary | ICD-10-CM | POA: Diagnosis not present

## 2020-06-30 DIAGNOSIS — F419 Anxiety disorder, unspecified: Secondary | ICD-10-CM | POA: Diagnosis not present

## 2020-07-05 ENCOUNTER — Encounter: Payer: Self-pay | Admitting: Gastroenterology

## 2020-07-06 ENCOUNTER — Telehealth: Payer: Self-pay | Admitting: *Deleted

## 2020-07-06 DIAGNOSIS — E785 Hyperlipidemia, unspecified: Secondary | ICD-10-CM

## 2020-07-06 DIAGNOSIS — Z79899 Other long term (current) drug therapy: Secondary | ICD-10-CM

## 2020-07-06 NOTE — Telephone Encounter (Signed)
-----   Message from Vennie Homans sent at 02/11/2020  4:35 PM EDT ----- Fasting Lipid and Liver in 6 Months

## 2020-07-06 NOTE — Telephone Encounter (Signed)
Lipid and liver lab slip mailed to pt

## 2020-07-12 ENCOUNTER — Emergency Department (HOSPITAL_COMMUNITY): Payer: Medicare Other

## 2020-07-12 ENCOUNTER — Encounter (HOSPITAL_COMMUNITY): Payer: Self-pay | Admitting: Emergency Medicine

## 2020-07-12 ENCOUNTER — Emergency Department (HOSPITAL_COMMUNITY)
Admission: EM | Admit: 2020-07-12 | Discharge: 2020-07-12 | Disposition: A | Discharge status: Home / Self Care | Payer: Medicare Other | Attending: Emergency Medicine | Admitting: Emergency Medicine

## 2020-07-12 ENCOUNTER — Other Ambulatory Visit: Payer: Self-pay

## 2020-07-12 DIAGNOSIS — Z87891 Personal history of nicotine dependence: Secondary | ICD-10-CM | POA: Insufficient documentation

## 2020-07-12 DIAGNOSIS — Z79899 Other long term (current) drug therapy: Secondary | ICD-10-CM | POA: Insufficient documentation

## 2020-07-12 DIAGNOSIS — I2510 Atherosclerotic heart disease of native coronary artery without angina pectoris: Secondary | ICD-10-CM | POA: Insufficient documentation

## 2020-07-12 DIAGNOSIS — R0602 Shortness of breath: Secondary | ICD-10-CM | POA: Diagnosis not present

## 2020-07-12 DIAGNOSIS — Z85828 Personal history of other malignant neoplasm of skin: Secondary | ICD-10-CM | POA: Insufficient documentation

## 2020-07-12 DIAGNOSIS — Z96653 Presence of artificial knee joint, bilateral: Secondary | ICD-10-CM | POA: Insufficient documentation

## 2020-07-12 DIAGNOSIS — Z7901 Long term (current) use of anticoagulants: Secondary | ICD-10-CM | POA: Diagnosis not present

## 2020-07-12 DIAGNOSIS — Z955 Presence of coronary angioplasty implant and graft: Secondary | ICD-10-CM | POA: Diagnosis not present

## 2020-07-12 DIAGNOSIS — J9811 Atelectasis: Secondary | ICD-10-CM | POA: Diagnosis not present

## 2020-07-12 DIAGNOSIS — Z86711 Personal history of pulmonary embolism: Secondary | ICD-10-CM | POA: Insufficient documentation

## 2020-07-12 DIAGNOSIS — F419 Anxiety disorder, unspecified: Secondary | ICD-10-CM | POA: Insufficient documentation

## 2020-07-12 DIAGNOSIS — Z20822 Contact with and (suspected) exposure to covid-19: Secondary | ICD-10-CM | POA: Diagnosis not present

## 2020-07-12 DIAGNOSIS — J45909 Unspecified asthma, uncomplicated: Secondary | ICD-10-CM | POA: Insufficient documentation

## 2020-07-12 DIAGNOSIS — Z7902 Long term (current) use of antithrombotics/antiplatelets: Secondary | ICD-10-CM | POA: Insufficient documentation

## 2020-07-12 DIAGNOSIS — I10 Essential (primary) hypertension: Secondary | ICD-10-CM | POA: Diagnosis not present

## 2020-07-12 DIAGNOSIS — R0600 Dyspnea, unspecified: Secondary | ICD-10-CM | POA: Diagnosis not present

## 2020-07-12 DIAGNOSIS — Z8673 Personal history of transient ischemic attack (TIA), and cerebral infarction without residual deficits: Secondary | ICD-10-CM | POA: Diagnosis not present

## 2020-07-12 DIAGNOSIS — R948 Abnormal results of function studies of other organs and systems: Secondary | ICD-10-CM | POA: Diagnosis not present

## 2020-07-12 DIAGNOSIS — E291 Testicular hypofunction: Secondary | ICD-10-CM | POA: Diagnosis not present

## 2020-07-12 LAB — BASIC METABOLIC PANEL
Anion gap: 12 (ref 5–15)
BUN: 16 mg/dL (ref 8–23)
CO2: 26 mmol/L (ref 22–32)
Calcium: 9 mg/dL (ref 8.9–10.3)
Chloride: 102 mmol/L (ref 98–111)
Creatinine, Ser: 1.44 mg/dL — ABNORMAL HIGH (ref 0.61–1.24)
GFR calc Af Amer: 54 mL/min — ABNORMAL LOW (ref >60)
GFR calc non Af Amer: 47 mL/min — ABNORMAL LOW (ref >60)
Glucose, Bld: 114 mg/dL — ABNORMAL HIGH (ref 70–99)
Potassium: 4.4 mmol/L (ref 3.5–5.1)
Sodium: 140 mmol/L (ref 135–145)

## 2020-07-12 LAB — TROPONIN I (HIGH SENSITIVITY)
Troponin I (High Sensitivity): 5 ng/L (ref <18)
Troponin I (High Sensitivity): 7 ng/L (ref <18)

## 2020-07-12 LAB — CBC
HCT: 48.8 % (ref 39.0–52.0)
Hemoglobin: 16.8 g/dL (ref 13.0–17.0)
MCH: 32.7 pg (ref 26.0–34.0)
MCHC: 34.4 g/dL (ref 30.0–36.0)
MCV: 95.1 fL (ref 80.0–100.0)
Platelets: 219 10*3/uL (ref 150–400)
RBC: 5.13 MIL/uL (ref 4.22–5.81)
RDW: 13.2 % (ref 11.5–15.5)
WBC: 8.3 10*3/uL (ref 4.0–10.5)
nRBC: 0 % (ref 0.0–0.2)

## 2020-07-12 LAB — CBG MONITORING, ED: Glucose-Capillary: 112 mg/dL — ABNORMAL HIGH (ref 70–99)

## 2020-07-12 LAB — D-DIMER, QUANTITATIVE: D-Dimer, Quant: 0.3 ug/mL-FEU (ref 0.00–0.50)

## 2020-07-12 LAB — SARS CORONAVIRUS 2 BY RT PCR (HOSPITAL ORDER, PERFORMED IN ~~LOC~~ HOSPITAL LAB): SARS Coronavirus 2: NEGATIVE

## 2020-07-12 MED ORDER — LORAZEPAM 2 MG/ML IJ SOLN: 1.0000 mg | Freq: Once | INTRAMUSCULAR | Status: DC

## 2020-07-12 MED ORDER — ASPIRIN 81 MG PO CHEW
324.0000 mg | CHEWABLE_TABLET | Freq: Once | ORAL | Status: AC
  Administered 2020-07-12: 324 mg via ORAL
  Filled 2020-07-12: qty 4

## 2020-07-12 MED ORDER — ALPRAZOLAM 0.5 MG PO TABS: 0.5000 mg | ORAL_TABLET | Freq: Two times a day (BID) | ORAL | 0 refills | Status: DC | PRN

## 2020-07-12 MED ORDER — LORAZEPAM 1 MG PO TABS
1.0000 mg | ORAL_TABLET | Freq: Once | ORAL | Status: AC
  Administered 2020-07-12: 1 mg via ORAL
  Filled 2020-07-12: qty 1

## 2020-07-12 NOTE — ED Provider Notes (Signed)
[name]   CSN: [id number] Arrival date & time: 07/12/20  1627     History Chief Complaint  Patient presents with  . Anxiety    [name] is a 79 y.o. male.  HPI   Patient presents ED with complaints of a panic attack. Patient states he has a history of panic attacks but has not had one this severe. It started few hours ago. While he was at home he took Ativan which has helped slightly but he still is feeling very anxious. He cannot sit still. He does feel some shortness of breath and had some mild discomfort in his chest earlier. Patient denies any cough. He is feeling jittery. No fevers or chills. Patient has been fully vaccinated for Covid.  Past Medical History:  Diagnosis Date  . Anxiety   . Arthritis    KNEES, NECK, SHOULDERS, HANDS  . Asthma   . Biceps tendon tear    right  . Cancer ([place])    skin cancer-  2 basal cell, 1 squamous cell  . Coronary artery disease   . GERD (gastroesophageal reflux disease)   . Gout, arthritis    BILATERAL GREAT TOE-- PER PT STABLE  . H/O transfusion of packed red blood cells    as a child/ s/p tonsillectomy  . History of pulmonary embolism    Maintained on lifelong anticoagulation with Eliquis.  . Lung nodule    BENIGN RIGHT UPPER--  CHEST CT 08-22-2011  . OSA (obstructive sleep apnea)    NO CPAP -states dr said possibly related to asthma  . S/P dilatation of esophageal stricture   . Stroke ([place])   . Torn rotator cuff    Right x 3    Patient Active Problem List   Diagnosis Date Noted  . History of pulmonary embolism 06/25/2019  . CAD S/P percutaneous coronary angioplasty 10/11/2018  . Hyperlipidemia with target LDL less than 70 10/11/2018  . Abnormal cardiac CT angiography 06/05/2018  . Essential hypertension 04/07/2018  . Atypical angina ([place]) -Class II-III 04/07/2018  . Allergic rhinitis 03/17/2018  . Spondylosis, cervical, with myelopathy 12/18/2015  . Cervical  disc disorder with radiculopathy of cervical region 12/18/2015  . Spinal cord injury, cervical region ([place]) 12/18/2015  . Central retinal artery occlusion 07/02/2014  . Cerebral infarction due to unspecified mechanism 07/02/2014  . DOE (dyspnea on exertion) 06/08/2014  . Postoperative anemia due to acute blood loss 01/20/2014  . OA (osteoarthritis) of knee 01/18/2014    Past Surgical History:  Procedure Laterality Date  . CARPAL TUNNEL RELEASE Right 2010  . CORONARY CALCIUM SCORING WITH CT CORONARY ANGIOGRAM  05/29/2018   Coronary calcium score 416.  Possible obstructive CAD in the mid circumflex-OM1-OM 2, D2 and a moderate disease in the proximal LAD -->  FFR --> LAD: Proximal.90, mid.86 distal.68 D2.80, Cx: Mid.76 OM2.74: Hemodynamically significant distal LAD, D2, mid circumflex and OM2 lesions  . CORONARY STENT INTERVENTION N/A 06/10/2018   Procedure: CORONARY STENT INTERVENTION;  Surgeon: Jettie Booze, MD;  Location: [facility] INVASIVE CV LAB;;  90% mLAD --> DES PCI (ORSIRO 2.75X18) => 0%  . JOINT REPLACEMENT    . LEFT HEART CATH AND CORONARY ANGIOGRAPHY N/A 06/10/2018   Procedure: LEFT HEART CATH AND CORONARY ANGIOGRAPHY;  Surgeon: Jettie Booze, MD;  Location: [facility] INVASIVE CV LAB;;  90% mLAD --> DES PCI.  pRCA 25%. p-m Cx 40%. Ost OM1 50%, ost OM2 40% with 50% mid.  pLAD 30% &  ost D1 75 % (med Rx).  Normal LVEDP.   [name] NEPHRECTOMY    . SHOULDER ARTHROSCOPY Right   . TONSILLECTOMY  AGE 29  . TOTAL KNEE ARTHROPLASTY Left 10-15-2008  . TOTAL KNEE ARTHROPLASTY Right 01/18/2014   Procedure: RIGHT TOTAL KNEE ARTHROPLASTY;  Surgeon: Gearlean Alf, MD;  Location: [facility];  Service: Orthopedics;  Laterality: Right;  . TRANSTHORACIC ECHOCARDIOGRAM  01/2018    Normal LV size with moderate LVH.  EF 60 to 65% with GR 1 DD (normal for age)  . WRIST GANGLION EXCISION Right 1964       Family History  Problem Relation Age of Onset  . Deep vein thrombosis Father   . Stroke Father   . Stroke  Brother   . Colon cancer Mother   . Migraines Neg Hx     Social History   Tobacco Use  . Smoking status: Former Smoker    Packs/day: 1.00    Years: 25.00    Pack years: 25.00    Types: Cigarettes    Quit date: 07/13/1980    Years since quitting: 40.0  . Smokeless tobacco: Never Used  Vaping Use  . Vaping Use: Never used  Substance Use Topics  . Alcohol use: Yes    Comment: RARE  . Drug use: No    Home Medications Prior to Admission medications   Medication Sig Start Date End Date Taking? Authorizing Provider  allopurinol (ZYLOPRIM) 300 MG tablet Take 300 mg by mouth daily.    [provider]  ALPRAZolam Duanne Moron) 0.5 MG tablet Take 1 tablet (0.5 mg total) by mouth 2 (two) times daily as needed for anxiety. 07/12/20   Dorie Rank, MD  apixaban (ELIQUIS) 5 MG TABS tablet Take 5 mg by mouth 2 (two) times daily.    [provider]  clopidogrel (PLAVIX) 75 MG tablet TAKE 1 TABLET BY MOUTH ONCE A DAY WITH BREAKFAST Patient taking differently: Take 75 mg by mouth daily.  11/30/19   Leonie Man, MD  escitalopram (LEXAPRO) 20 MG tablet Take 20 mg by mouth daily.     [provider]  gabapentin (NEURONTIN) 300 MG capsule Take 300 mg by mouth 3 (three) times daily.    [provider]  methocarbamol (ROBAXIN) 750 MG tablet Take 750 mg by mouth 3 (three) times daily.    [provider]  Multiple Vitamin (MULTIVITAMIN WITH MINERALS) TABS tablet Take 1 tablet by mouth daily.    [provider]  nitroGLYCERIN (NITROSTAT) 0.4 MG SL tablet Place 1 tablet (0.4 mg total) under the tongue every 5 (five) minutes as needed for chest pain. 06/12/18   Leonie Man, MD  ondansetron (ZOFRAN) 4 MG tablet Take 1 tablet (4 mg total) by mouth every 8 (eight) hours as needed for nausea or vomiting. 03/03/20   Nicholes Stairs, MD  oxyCODONE (ROXICODONE) 5 MG immediate release tablet Take 1 tablet (5 mg total) by mouth every 6 (six) hours as needed for  severe pain or breakthrough pain. 03/03/20 03/03/21  Nicholes Stairs, MD  Probiotic Product (PROBIOTIC DAILY PO) Take 1 capsule by mouth daily.    [provider]  rosuvastatin (CRESTOR) 20 MG tablet Take 1 tablet (20 mg total) by mouth at bedtime. Patient taking differently: Take 10 mg by mouth at bedtime.  02/15/20   Lendon Colonel, NP  tadalafil (CIALIS) 20 MG tablet Take 20 mg by mouth daily as needed for erectile dysfunction.     [provider]  tadalafil (CIALIS) 5 MG tablet Take 5 mg by mouth daily. 12/21/19   [provider]  testosterone cypionate (DEPOTESTOSTERONE CYPIONATE) 200 MG/ML injection Inject 200 mg into the muscle once a week. 1 ml weekly 01/14/20   [provider]  traMADol-acetaminophen (ULTRACET) 37.5-325 MG tablet Take 1 tablet by mouth in the morning, at noon, and at bedtime.    [provider]  valsartan (DIOVAN) 320 MG tablet Take 320 mg by mouth daily.    [provider]    Allergies    Garlic, Onion, Codeine, Hydrocodone, and Novocain [procaine]  Review of Systems   Review of Systems  All other systems reviewed and are negative.   Physical Exam Updated Vital Signs BP (!) 143/91 (BP Location: Left Arm)   Pulse 97   Temp 98.6 F (37 C) (Oral)   Resp 16   SpO2 96%   Physical Exam Vitals and nursing Browning reviewed.  Constitutional:      General: He is not in acute distress.    Appearance: He is well-developed.  HENT:     Head: Normocephalic and atraumatic.     Right Ear: External ear normal.     Left Ear: External ear normal.  Eyes:     General: No scleral icterus.       Right eye: No discharge.        Left eye: No discharge.     Conjunctiva/sclera: Conjunctivae normal.  Neck:     Trachea: No tracheal deviation.  Cardiovascular:     Rate and Rhythm: Regular rhythm. Tachycardia present.  Pulmonary:     Effort: Pulmonary effort is normal. No respiratory distress.     Breath sounds:  Normal breath sounds. No stridor. No wheezing or rales.  Abdominal:     General: Bowel sounds are normal. There is no distension.     Palpations: Abdomen is soft.     Tenderness: There is no abdominal tenderness. There is no guarding or rebound.  Musculoskeletal:        General: No tenderness.     Cervical back: Neck supple.  Skin:    General: Skin is warm and dry.     Findings: No rash.  Neurological:     Mental Status: He is alert.     Cranial Nerves: No cranial nerve deficit (no facial droop, extraocular movements intact, no slurred speech).     Sensory: No sensory deficit.     Motor: No abnormal muscle tone or seizure activity.     Coordination: Coordination normal.  Psychiatric:        Mood and Affect: Mood is anxious.        Speech: Speech is not rapid and pressured or tangential.        Behavior: Behavior is not aggressive.        Thought Content: Thought content does not include homicidal ideation.     ED Results / Procedures / Treatments   Labs (all labs ordered are listed, but only abnormal results are displayed) Labs Reviewed  BASIC METABOLIC PANEL - Abnormal; Notable for the following components:      Result Value   Glucose, Bld 114 (*)    Creatinine, Ser 1.44 (*)    GFR calc non Af Amer 47 (*)    GFR calc Af Amer 54 (*)    All other components within normal limits  CBG MONITORING, ED - Abnormal; Notable for the following components:   Glucose-Capillary 112 (*)  All other components within normal limits  SARS CORONAVIRUS 2 BY RT PCR (HOSPITAL ORDER, [place] [facility])  CBC  D-DIMER, QUANTITATIVE (NOT AT [facility])  TROPONIN I (HIGH SENSITIVITY)  TROPONIN I (HIGH SENSITIVITY)    EKG EKG Interpretation  Date/Time:  Tuesday July 12 2020 17:21:57 EDT Ventricular Rate:  121 PR Interval:  158 QRS Duration: 74 QT Interval:  320 QTC Calculation: 161 R Axis:   -11 Text Interpretation: Sinus tachycardia with occasional Premature  ventricular complexes and Fusion complexes Possible Inferior infarct , age undetermined Abnormal ECG Since last tracing rate faster Confirmed by Dorie Rank [phone]) on 07/12/2020 6:49:53 PM   Radiology DG Chest Portable 1 View  Result Date: 07/12/2020 CLINICAL DATA:  Dyspnea.  Shortness of breath. EXAM: PORTABLE CHEST 1 VIEW COMPARISON:  Radiograph 11/03/2014 FINDINGS: The cardiomediastinal contours are normal. Subsegmental atelectasis in the left lung base. Pulmonary vasculature is normal. No confluent consolidation, pleural effusion, or pneumothorax. No acute osseous abnormalities are seen. IMPRESSION: Subsegmental atelectasis in the left lung base. Electronically Signed   By: Keith Rake M.D.   On: 07/12/2020 17:27    Procedures Procedures (including critical care time)  Medications Ordered in ED Medications  aspirin chewable tablet 324 mg (324 mg Oral Given 07/12/20 1747)  LORazepam (ATIVAN) tablet 1 mg (1 mg Oral Given 07/12/20 1748)    ED Course  I have reviewed the triage vital signs and the nursing notes.  Pertinent labs & imaging results that were available during my care of the patient were reviewed by me and considered in my medical decision making (see chart for details).  Clinical Course as of Jul 12 2112  Tue Jul 12, 2020  1731 Pt refused ativan iv   [JK]  1849 D-dimer is normal.  Troponin is normal.  Creatinine is stable compared to previous values   [JK]  1906 Hr has improved   [JK]  1911 Pt is feeling better    [JK]    Clinical Course User Index [JK] Dorie Rank, MD   MDM Rules/Calculators/A&P                          Pt presented with complaints of anxiety, dysnea, mild chest discomfort.  Sx atypical for ACS.  D dimer negative.  Serial trop negative.  Doubt acs, pe.  Suspect sx related to anxiety attack.  Will give short course of benzo as requested. PDMP reviewed., Final Clinical Impression(s) / ED Diagnoses Final diagnoses:  Anxiety    Rx / DC Orders ED  Discharge Orders         Ordered    ALPRAZolam (XANAX) 0.5 MG tablet  2 times daily PRN        07/12/20 2112           Dorie Rank, MD 07/12/20 2114

## 2020-07-12 NOTE — ED Notes (Signed)
Pt seems calmer. Pt able to lay in bed.

## 2020-07-12 NOTE — Discharge Instructions (Addendum)
Take the medications as needed for anxiety.   Follow up with your primary are doctor

## 2020-07-12 NOTE — ED Triage Notes (Signed)
Per pt, states he is having a panic attack-states it does not take much for him to get excited-states home meds not working

## 2020-07-13 DIAGNOSIS — F332 Major depressive disorder, recurrent severe without psychotic features: Secondary | ICD-10-CM | POA: Diagnosis not present

## 2020-07-13 DIAGNOSIS — F411 Generalized anxiety disorder: Secondary | ICD-10-CM | POA: Diagnosis not present

## 2020-07-13 DIAGNOSIS — F1020 Alcohol dependence, uncomplicated: Secondary | ICD-10-CM | POA: Diagnosis not present

## 2020-07-18 DIAGNOSIS — F411 Generalized anxiety disorder: Secondary | ICD-10-CM | POA: Diagnosis not present

## 2020-07-18 DIAGNOSIS — F1020 Alcohol dependence, uncomplicated: Secondary | ICD-10-CM | POA: Diagnosis not present

## 2020-07-18 DIAGNOSIS — F332 Major depressive disorder, recurrent severe without psychotic features: Secondary | ICD-10-CM | POA: Diagnosis not present

## 2020-07-22 DIAGNOSIS — R3915 Urgency of urination: Secondary | ICD-10-CM | POA: Diagnosis not present

## 2020-07-22 DIAGNOSIS — R972 Elevated prostate specific antigen [PSA]: Secondary | ICD-10-CM | POA: Diagnosis not present

## 2020-07-22 DIAGNOSIS — N401 Enlarged prostate with lower urinary tract symptoms: Secondary | ICD-10-CM | POA: Diagnosis not present

## 2020-07-22 DIAGNOSIS — N5201 Erectile dysfunction due to arterial insufficiency: Secondary | ICD-10-CM | POA: Diagnosis not present

## 2020-07-25 ENCOUNTER — Encounter: Payer: Self-pay | Admitting: Cardiology

## 2020-07-25 ENCOUNTER — Ambulatory Visit: Payer: Medicare Other | Admitting: Cardiology

## 2020-07-25 ENCOUNTER — Other Ambulatory Visit: Payer: Self-pay

## 2020-07-25 VITALS — BP 148/70 | HR 94 | Ht 71.0 in | Wt 241.2 lb

## 2020-07-25 DIAGNOSIS — Z9861 Coronary angioplasty status: Secondary | ICD-10-CM | POA: Diagnosis not present

## 2020-07-25 DIAGNOSIS — I493 Ventricular premature depolarization: Secondary | ICD-10-CM

## 2020-07-25 DIAGNOSIS — E785 Hyperlipidemia, unspecified: Secondary | ICD-10-CM

## 2020-07-25 DIAGNOSIS — R0600 Dyspnea, unspecified: Secondary | ICD-10-CM

## 2020-07-25 DIAGNOSIS — I208 Other forms of angina pectoris: Secondary | ICD-10-CM | POA: Diagnosis not present

## 2020-07-25 DIAGNOSIS — I10 Essential (primary) hypertension: Secondary | ICD-10-CM | POA: Diagnosis not present

## 2020-07-25 DIAGNOSIS — I2510 Atherosclerotic heart disease of native coronary artery without angina pectoris: Secondary | ICD-10-CM | POA: Diagnosis not present

## 2020-07-25 DIAGNOSIS — R0609 Other forms of dyspnea: Secondary | ICD-10-CM

## 2020-07-25 DIAGNOSIS — Z86711 Personal history of pulmonary embolism: Secondary | ICD-10-CM

## 2020-07-25 NOTE — Progress Notes (Signed)
Primary Care Provider: Harlan Stains, MD Cardiologist: Glenetta Hew, MD Electrophysiologist: None  Clinic Note: Chief Complaint  Patient presents with  . Follow-up    1 year for me, 5-6 months with clinic.  [name] Coronary Artery Disease    No recurrent angina     HPI:    [name] is a 79 y.o. male with a PMH notable for single-vessel CAD seen on coronary CTA (PCI to the LAD) as well as history of recurrent DVT PE on Eliquis, OSA and prior stroke who presents today for 5-35-month follow-up  I last saw [name] in September 2020 he was doing very well with no anginal symptoms.  He had been losing a lot of weight and COVID-19 hip, and then he put some back on physical not exercising much.  Noted dyspnea if walking uphill briskly for carrying something.  Noted that he was having difficulty keeping up with the kids these days.[name]  Despite this, he is feeling overall pretty good.[name] was last seen on February 08, 2020 by Jory Sims, NP for preop evaluation for upcoming shoulder surgery.  Clearance provided was told he can hold Eliquis 48 hours prior to procedure.  Unfortunately there was question about holding antiplatelet agent.  There is no symptoms of chest pain or dyspnea with rest exertion.  Permission to hold Plavix was confirmed.  Blood pressure is stable.  Was not on statin, however statin was recommended.  Recent Hospitalizations:   03/03/2020 Left shoulder arthroscopy with extensive debridement and subacromial decompression with distal clavicle resection (Dr. Stann Mainland)  07/12/2020: ER visit for panic attack.  He did take Ativan before he came in with starting of some still was having difficulty sitting still stopping meditation.  Some dyspnea and mild chest discomfort noted.  With palpitation and jitteriness resolved.  Reviewed  CV studies:    The following studies were reviewed today: (if available, images/films reviewed: From Epic Chart or Care  Everywhere) . None:   Interval History:   [name] is here today for annual follow-up to see me.  Well.  He is very active he is building a deck, and previously built a fence in his backyard.  He does not do vigorous exercise, but is always on the go.  He does yard work without any issues.  About that when he knows that his ankle can get swollen if he sits for long period time.  He denies any significant palpitations but just notes that his heart skipping off and on.  He has not had any PND orthopnea, just feels a little short of breath if he overdoes it.  His blood pressure is high today but he indicated he was in quite a rush to get here and had not had a chance to settle down before they checked his blood pressure.  At home the highest reading gets is usually 135/80.  He denies any chest pain or pressure.  He does occasionally note some palpitations but nothing prolonged.  Just skipped beats.  CV Review of Symptoms (Summary) Cardiovascular ROS: no chest pain or dyspnea on exertion positive for - irregular heartbeat and palpitations negative for - orthopnea, palpitations, paroxysmal nocturnal dyspnea, rapid heart rate or shortness of breath  The patient does not have symptoms concerning for COVID-19 infection (fever, chills, cough, or new shortness of breath).   REVIEWED OF SYSTEMS   Review of Systems  Constitutional: Negative for malaise/fatigue.  HENT: Negative for nosebleeds.   Respiratory: Negative for  shortness of breath.   Gastrointestinal: Negative for blood in stool and melena.  Genitourinary: Negative for hematuria.  Musculoskeletal: Positive for joint pain (Shoulder is feeling a lot better, still somewhat sore.). Negative for falls.  Neurological: Negative for dizziness, focal weakness, weakness and headaches.  Psychiatric/Behavioral: Negative for memory loss. The patient is not nervous/anxious and does not have insomnia.    I have reviewed and (if needed)  personally updated the patient's problem list, medications, allergies, past medical and surgical history, social and family history.   PAST MEDICAL HISTORY   Past Medical History:  Diagnosis Date  . Anxiety   . Arthritis    KNEES, NECK, SHOULDERS, HANDS  . Asthma   . Biceps tendon tear    right  . Cancer ([place])    skin cancer-  2 basal cell, 1 squamous cell  . Coronary artery disease   . GERD (gastroesophageal reflux disease)   . Gout, arthritis    BILATERAL GREAT TOE-- PER PT STABLE  . H/O transfusion of packed red blood cells    as a child/ s/p tonsillectomy  . History of pulmonary embolism    Maintained on lifelong anticoagulation with Eliquis.  . Lung nodule    BENIGN RIGHT UPPER--  CHEST CT 08-22-2011  . OSA (obstructive sleep apnea)    NO CPAP -states dr said possibly related to asthma  . S/P dilatation of esophageal stricture   . Stroke ([place])   . Torn rotator cuff    Right x 3    PAST SURGICAL HISTORY   Past Surgical History:  Procedure Laterality Date  . CARPAL TUNNEL RELEASE Right 2010  . CORONARY CALCIUM SCORING WITH CT CORONARY ANGIOGRAM  05/29/2018   Coronary calcium score 416.  Possible obstructive CAD in the mid circumflex-OM1-OM 2, D2 and a moderate disease in the proximal LAD -->  FFR --> LAD: Proximal.90, mid.86 distal.68 D2.80, Cx: Mid.76 OM2.74: Hemodynamically significant distal LAD, D2, mid circumflex and OM2 lesions  . CORONARY STENT INTERVENTION N/A 06/10/2018   Procedure: CORONARY STENT INTERVENTION;  Surgeon: Jettie Booze, MD;  Location: [facility];;  90% mLAD --> DES PCI (ORSIRO 2.75X18) => 0%  . JOINT REPLACEMENT    . LEFT HEART CATH AND CORONARY ANGIOGRAPHY N/A 06/10/2018   Procedure: LEFT HEART CATH AND CORONARY ANGIOGRAPHY;  Surgeon: Jettie Booze, MD;  Location: [facility];;  90% mLAD --> DES PCI.  pRCA 25%. p-m Cx 40%. Ost OM1 50%, ost OM2 40% with 50% mid.  pLAD 30% & ost D1 75 % (med Rx).  Normal LVEDP.   [name]  NEPHRECTOMY    . SHOULDER ARTHROSCOPY Right   . TONSILLECTOMY  AGE 64  . TOTAL KNEE ARTHROPLASTY Left 10-15-2008  . TOTAL KNEE ARTHROPLASTY Right 01/18/2014   Procedure: RIGHT TOTAL KNEE ARTHROPLASTY;  Surgeon: Gearlean Alf, MD;  Location: [facility];  Service: Orthopedics;  Laterality: Right;  . TRANSTHORACIC ECHOCARDIOGRAM  01/2018    Normal LV size with moderate LVH.  EF 60 to 65% with GR 1 DD (normal for age)  . WRIST GANGLION EXCISION Right 1964     Cardiac Cath-PCI 06/10/2018: 90% mLAD --> DES PCI (ORSIRO 2.75X18) => 0%.  pRCA 25%. p-m Cx 40%. Ost OM1 50%, ost OM2 40% with 50% mid.  pLAD 30% & ost D1 75 % (med Rx).  Normal LVEDP. Intervention  Orsiro DES 2.75 x 18        Immunization History  Administered Date(s) Administered  .  Influenza Split 09/05/2013  . Influenza,inj,quad, With Preservative 07/24/2017  . PFIZER SARS-COV-2 Vaccination 12/10/2019, 01/04/2020  . Pneumococcal Polysaccharide-23 12/06/2013    MEDICATIONS/ALLERGIES   Current Meds  Medication Sig  . allopurinol (ZYLOPRIM) 300 MG tablet Take 300 mg by mouth daily.  [name] ALPRAZolam (XANAX) 0.5 MG tablet Take 1 tablet (0.5 mg total) by mouth 2 (two) times daily as needed for anxiety.  [name] apixaban (ELIQUIS) 5 MG TABS tablet Take 5 mg by mouth 2 (two) times daily.  [name] escitalopram (LEXAPRO) 5 MG tablet Take 10 mg by mouth daily.  [name] gabapentin (NEURONTIN) 300 MG capsule Take 300 mg by mouth 3 (three) times daily. 300 mg in am, 300 mg at noon and 600 at bedtime  . methocarbamol (ROBAXIN) 750 MG tablet Take 750 mg by mouth 3 (three) times daily.  . Multiple Vitamin (MULTIVITAMIN WITH MINERALS) TABS tablet Take 1 tablet by mouth daily.  . nitroGLYCERIN (NITROSTAT) 0.4 MG SL tablet Place 1 tablet (0.4 mg total) under the tongue every 5 (five) minutes as needed for chest pain.  [name] ondansetron (ZOFRAN) 4 MG tablet Take 1 tablet (4 mg total) by mouth every 8 (eight) hours as needed for nausea or vomiting.  . Probiotic Product  (PROBIOTIC DAILY PO) Take 1 capsule by mouth daily.  . rosuvastatin (CRESTOR) 20 MG tablet Take 10 mg by mouth daily.  . tadalafil (CIALIS) 20 MG tablet Take 20 mg by mouth daily as needed for erectile dysfunction.   [name] testosterone cypionate (DEPOTESTOSTERONE CYPIONATE) 200 MG/ML injection Inject 200 mg into the muscle once a week. 1 ml weekly  . traMADol-acetaminophen (ULTRACET) 37.5-325 MG tablet Take 1 tablet by mouth in the morning, at noon, and at bedtime.  . valsartan (DIOVAN) 320 MG tablet Take 320 mg by mouth daily.  . [DISCONTINUED] rosuvastatin (CRESTOR) 20 MG tablet Take 1 tablet (20 mg total) by mouth at bedtime. (Patient taking differently: Take 10 mg by mouth at bedtime. )    Allergies  Allergen Reactions  . Garlic Anaphylaxis  . Onion Anaphylaxis  . Codeine Other (See Comments)    "PASSES OUT"  . Hydrocodone     Pt states that he is allergic to synthetic hydrocodone.  . Novocain [Procaine] Nausea Only    SOCIAL HISTORY/FAMILY HISTORY   Reviewed in Epic:  Pertinent findings: Remains active  OBJCTIVE -PE, EKG, labs   Wt Readings from Last 3 Encounters:  07/25/20 241 lb 3.2 oz (109.4 kg)  03/03/20 235 lb (106.6 kg)  03/01/20 248 lb 4.8 oz (112.6 kg)    Physical Exam: BP (!) 148/70   Pulse 94   Ht 5\' 11"  (1.803 m)   Wt 241 lb 3.2 oz (109.4 kg)   SpO2 98%   BMI 33.64 kg/m  Physical Exam   Adult ECG Report  Rate: 94 ;  Rhythm: normal sinus rhythm, premature ventricular contractions (PVC) and Frequent PVCs and appeared to be intermittently in a bigeminal pattern.  Underlying inferior MI, age undetermined.;   Narrative Interpretation: Increased PVCs with compared to last visit.  Recent Labs:    Lab Results  Component Value Date   CHOL 93 (L) 02/09/2020   HDL 35 (L) 02/09/2020   LDLCALC 40 02/09/2020   TRIG 93 02/09/2020   CHOLHDL 2.7 02/09/2020   Lab Results  Component Value Date   CREATININE 1.44 (H) 07/12/2020   BUN 16 07/12/2020   NA 140  07/12/2020   K 4.4 07/12/2020   CL 102 07/12/2020   CO2  26 07/12/2020   Lab Results  Component Value Date   TSH 2.33 12/09/2006    ASSESSMENT/PLAN    Problem List Items Addressed This Visit    Atypical angina (HCC) -Class II-III (Chronic)    He had exertional dyspnea that he is no longer noticing now.  Definitely feeling better after his PCI.  I would like her to get more active and get more exercise.  However with what he is doing, he is not having any issues.  Plan: Vent really on any medications.  Not on a beta-blocker or calcium channel blocker but no angina.      Relevant Medications   rosuvastatin (CRESTOR) 20 MG tablet   Other Relevant Orders   LONG TERM MONITOR (3-14 DAYS)   EKG 12-Lead (Completed)   DOE (dyspnea on exertion) (Chronic)    Notably improved symptoms after the primary symptom that he had was dyspnea.      Hyperlipidemia with target LDL less than 70 (Chronic)    No LDL is less than 70 with documentation. Most recent labs from April outstanding-and rosuvastatin was reduced to 10 mg daily..    Continue current dose, will recheck lipids..  If his LDL goes back up again, will probably go back to 20 mg.      Relevant Medications   rosuvastatin (CRESTOR) 20 MG tablet   Essential hypertension (Chronic)    Blood pressure is high today, but he says is usually not as high at home.  Have asked that he monitor his pressures.      Relevant Medications   rosuvastatin (CRESTOR) 20 MG tablet   CAD S/P percutaneous coronary angioplasty - Primary (Chronic)    Strange since he is on, but abnormal coronary CTA and resultant PCI to distal LAD.  He does have some upstream disease that is moderate.  Plan: He is no longer on Plavix because he is on Eliquis.  He is taking aspirin but it is okay to stop. Remains off of beta-blocker because history of fatigue and bradycardia.  Is on statin and ARB only.  No longer on Plavix.  On Eliquis.  Okay to hold Eliquis usually 2  days preop for procedures or surgeries.      Relevant Medications   rosuvastatin (CRESTOR) 20 MG tablet   Other Relevant Orders   LONG TERM MONITOR (3-14 DAYS)   EKG 12-Lead (Completed)   History of pulmonary embolism (Chronic)    Remains on maintenance dose of Eliquis.  Managed by his PCP.  Being on Eliquis due to need for aspirin and Plavix with CAD.  Okay to hold off Eliquis 2 to 3 days preop for procedures or surgeries.      Frequent unifocal PVCs    Significant PVCs noted on his EKG.  I think it may need to consider event blocker, but have been reluctant to do that because of fatigue. Need to see PVC burden and to see if there is any true arrhythmias.  We will do a 3-day Zio patch  Low threshold to consider initiating beta-blocker      Relevant Medications   rosuvastatin (CRESTOR) 20 MG tablet   Other Relevant Orders   LONG TERM MONITOR (3-14 DAYS)   EKG 12-Lead (Completed)       COVID-19 Education: The signs and symptoms of COVID-19 were discussed with the patient and how to seek care for testing (follow up with PCP or arrange E-visit).   The importance of social distancing and COVID-19 vaccination was discussed today.  The patient is practicing social distancing & Masking.   I spent a total of 56minutes with the patient spent in direct patient consultation.  Additional time spent with chart review  / charting (studies, outside notes, etc): 8 Total Time: 26 min   Current medicines are reviewed at length with the patient today.  (+/- concerns) none  Notice: This dictation was prepared with Dragon dictation along with smaller phrase technology. Any transcriptional errors that result from this process are unintentional and may not be corrected upon review.  Patient Instructions / Medication Changes & Studies & Tests Ordered   Patient Instructions  Medication Instructions:  NO CHANGES *If you need a refill on your cardiac medications before your next appointment,  please call your pharmacy*   Lab Work:   COME BACK TO DO [place]   If you have labs (blood work) drawn today and your tests are completely normal, you will receive your results only by: [name] MyChart Message (if you have MyChart) OR . A paper copy in the mail If you have any lab test that is abnormal or we need to change your treatment, we will call you to review the results.   Testing/Procedures: Your physician has recommended that you wear a 3 DAY ZIO-PATCH monitor. The Zio patch cardiac monitor continuously records heart rhythm data, this is for patients being evaluated for multiple types heart rhythms. For the first 24 hours post application, please avoid getting the Zio monitor wet in the shower or by excessive sweating during exercise. After that, feel free to carry on with regular activities. Keep soaps and lotions away from the ZIO XT Patch.  Someone will be calling you to let you know when this is mailed.  This will be mailed to you, please expect 7-10 days to receive.      Call [facility] at [phone] if you have questions regarding your ZIO XT patch monitor.  Call them immediately if you see an orange light blinking on your monitor.   If your monitor falls off in less than 4 days contact our Monitor department at [phone].  If your monitor becomes loose or falls off after 4 days call [employer] at [phone] for suggestions on securing your monitor    Follow-Up: At [facility], you and your health needs are our priority.  As part of our continuing mission to provide you with exceptional heart care, we have created designated Provider Care Teams.  These Care Teams include your primary Cardiologist (physician) and Advanced Practice Providers (APPs -  Physician Assistants and Nurse Practitioners) who all work together to provide you with the care you need, when you need it.    Your next appointment:   7 week(s)  The format for your  next appointment:   Virtual Visit   Provider:   Glenetta Hew, MD   Other Instructions  ZIO XT- Long Term Monitor Instructions   Your physician has requested you wear your ZIO patch monitor____3___days.   This is a single patch monitor.  [employer] supplies one patch monitor per enrollment.  Additional stickers are not available.   Please do not apply patch if you will be having a Nuclear Stress Test, Echocardiogram, Cardiac CT, MRI, or Chest Xray during the time frame you would be wearing the monitor. The patch cannot be worn during these tests.  You cannot remove and re-apply the ZIO XT patch monitor.   Your ZIO patch monitor will be sent USPS Priority mail from [facility]  [employer] directly to your home address. The monitor may also be mailed to a PO BOX if home delivery is not available.   It may take 3-5 days to receive your monitor after you have been enrolled.   Once you have received you monitor, please review enclosed instructions.  Your monitor has already been registered assigning a specific monitor serial # to you.   Applying the monitor   Shave hair from upper left chest.   Hold abrader disc by orange tab.  Rub abrader in 40 strokes over left upper chest as indicated in your monitor instructions.   Clean area with 4 enclosed alcohol pads .  Use all pads to assure are is cleaned thoroughly.  Let dry.   Apply patch as indicated in monitor instructions.  Patch will be place under collarbone on left side of chest with arrow pointing upward.   Rub patch adhesive wings for 2 minutes.Remove white label marked "1".  Remove white label marked "2".  Rub patch adhesive wings for 2 additional minutes.   While looking in a mirror, press and release button in center of patch.  A small green light will flash 3-4 times .  This will be your only indicator the monitor has been turned on.     Do not shower for the first 24 hours.  You may shower after the first 24 hours.   Press button if  you feel a symptom. You will hear a small click.  Record Date, Time and Symptom in the Patient Log Book.   When you are ready to remove patch, follow instructions on last 2 pages of Patient Log Book.  Stick patch monitor onto last page of Patient Log Book.   Place Patient Log Book in [place] box.  Use locking tab on box and tape box closed securely.  The [employer] has [employer] on it.  Please place in mailbox as soon as possible.  Your physician should have your test results approximately 7 days after the monitor has been mailed back to [facility].   Call [place] at [phone] if you have questions regarding your ZIO XT patch monitor.  Call them immediately if you see an orange light blinking on your monitor.   If your monitor falls off in less than 4 days contact our Monitor department at [phone].  If your monitor becomes loose or falls off after 4 days call [employer] at [phone] for suggestions on securing your monitor.       Studies Ordered:   Orders Placed This Encounter  Procedures  . LONG TERM MONITOR (3-14 DAYS)  . EKG 12-Lead     Glenetta Hew, M.D., M.S. Interventional Cardiologist   Pager # [phone] Phone # [phone] [address] [place], [place] [address]   Thank you for choosing Heartcare at [facility]!!

## 2020-07-25 NOTE — Patient Instructions (Addendum)
Medication Instructions:  NO CHANGES *If you need a refill on your cardiac medications before your next appointment, please call your pharmacy*   Lab Work:   COME BACK TO DO [place]   If you have labs (blood work) drawn today and your tests are completely normal, you will receive your results only by: [name] MyChart Message (if you have MyChart) OR . A paper copy in the mail If you have any lab test that is abnormal or we need to change your treatment, we will call you to review the results.   Testing/Procedures: Your physician has recommended that you wear a 3 DAY ZIO-PATCH monitor. The Zio patch cardiac monitor continuously records heart rhythm data, this is for patients being evaluated for multiple types heart rhythms. For the first 24 hours post application, please avoid getting the Zio monitor wet in the shower or by excessive sweating during exercise. After that, feel free to carry on with regular activities. Keep soaps and lotions away from the ZIO XT Patch.  Someone will be calling you to let you know when this is mailed.  This will be mailed to you, please expect 7-10 days to receive.      Call [place] at [phone] if you have questions regarding your ZIO XT patch monitor.  Call them immediately if you see an orange light blinking on your monitor.   If your monitor falls off in less than 4 days contact our Monitor department at [phone].  If your monitor becomes loose or falls off after 4 days call [employer] at [phone] for suggestions on securing your monitor    Follow-Up: At [facility], you and your health needs are our priority.  As part of our continuing mission to provide you with exceptional heart care, we have created designated Provider Care Teams.  These Care Teams include your primary Cardiologist (physician) and Advanced Practice Providers (APPs -  Physician Assistants and Nurse Practitioners) who all work together to  provide you with the care you need, when you need it.    Your next appointment:   7 week(s)  The format for your next appointment:   Virtual Visit   Provider:   Glenetta Hew, MD   Other Instructions  ZIO XT- Long Term Monitor Instructions   Your physician has requested you wear your ZIO patch monitor____3___days.   This is a single patch monitor.  [employer] supplies one patch monitor per enrollment.  Additional stickers are not available.   Please do not apply patch if you will be having a Nuclear Stress Test, Echocardiogram, Cardiac CT, MRI, or Chest Xray during the time frame you would be wearing the monitor. The patch cannot be worn during these tests.  You cannot remove and re-apply the ZIO XT patch monitor.   Your ZIO patch monitor will be sent USPS Priority mail from [facility] directly to your home address. The monitor may also be mailed to a PO BOX if home delivery is not available.   It may take 3-5 days to receive your monitor after you have been enrolled.   Once you have received you monitor, please review enclosed instructions.  Your monitor has already been registered assigning a specific monitor serial # to you.   Applying the monitor   Shave hair from upper left chest.   Hold abrader disc by orange tab.  Rub abrader in 40 strokes over left upper chest as indicated in your monitor instructions.   Clean area with  4 enclosed alcohol pads .  Use all pads to assure are is cleaned thoroughly.  Let dry.   Apply patch as indicated in monitor instructions.  Patch will be place under collarbone on left side of chest with arrow pointing upward.   Rub patch adhesive wings for 2 minutes.Remove white label marked "1".  Remove white label marked "2".  Rub patch adhesive wings for 2 additional minutes.   While looking in a mirror, press and release button in center of patch.  A small green light will flash 3-4 times .  This will be your only indicator the monitor has  been turned on.     Do not shower for the first 24 hours.  You may shower after the first 24 hours.   Press button if you feel a symptom. You will hear a small click.  Record Date, Time and Symptom in the Patient Log Book.   When you are ready to remove patch, follow instructions on last 2 pages of Patient Log Book.  Stick patch monitor onto last page of Patient Log Book.   Place Patient Log Book in [place] box.  Use locking tab on box and tape box closed securely.  The [employer] has [employer] on it.  Please place in mailbox as soon as possible.  Your physician should have your test results approximately 7 days after the monitor has been mailed back to [facility].   Call [place] at [phone] if you have questions regarding your ZIO XT patch monitor.  Call them immediately if you see an orange light blinking on your monitor.   If your monitor falls off in less than 4 days contact our Monitor department at [phone].  If your monitor becomes loose or falls off after 4 days call [employer] at [phone] for suggestions on securing your monitor.

## 2020-07-26 ENCOUNTER — Telehealth: Payer: Self-pay | Admitting: Radiology

## 2020-07-26 NOTE — Telephone Encounter (Signed)
Enrolled patient for a 3 day Zio monitor to be mailed to patients home.  

## 2020-07-28 ENCOUNTER — Ambulatory Visit (INDEPENDENT_AMBULATORY_CARE_PROVIDER_SITE_OTHER): Payer: Medicare Other

## 2020-07-28 DIAGNOSIS — I208 Other forms of angina pectoris: Secondary | ICD-10-CM

## 2020-07-28 DIAGNOSIS — Z9861 Coronary angioplasty status: Secondary | ICD-10-CM

## 2020-07-28 DIAGNOSIS — I493 Ventricular premature depolarization: Secondary | ICD-10-CM

## 2020-07-28 DIAGNOSIS — I2510 Atherosclerotic heart disease of native coronary artery without angina pectoris: Secondary | ICD-10-CM

## 2020-07-29 ENCOUNTER — Encounter: Payer: Self-pay | Admitting: Cardiology

## 2020-07-29 NOTE — Assessment & Plan Note (Signed)
Significant PVCs noted on his EKG.  I think it may need to consider event blocker, but have been reluctant to do that because of fatigue. Need to see PVC burden and to see if there is any true arrhythmias.  We will do a 3-day Zio patch  Low threshold to consider initiating beta-blocker

## 2020-07-29 NOTE — Assessment & Plan Note (Signed)
He had exertional dyspnea that he is no longer noticing now.  Definitely feeling better after his PCI.  I would like her to get more active and get more exercise.  However with what he is doing, he is not having any issues.  Plan: Vent really on any medications.  Not on a beta-blocker or calcium channel blocker but no angina.

## 2020-07-29 NOTE — Assessment & Plan Note (Addendum)
Strange since he is on, but abnormal coronary CTA and resultant PCI to distal LAD.  He does have some upstream disease that is moderate.  Plan: He is no longer on Plavix because he is on Eliquis.  He is taking aspirin but it is okay to stop. Remains off of beta-blocker because history of fatigue and bradycardia.  Is on statin and ARB only.  No longer on Plavix.  On Eliquis.  Okay to hold Eliquis usually 2 days preop for procedures or surgeries.

## 2020-07-29 NOTE — Assessment & Plan Note (Signed)
Blood pressure is high today, but he says is usually not as high at home.  Have asked that he monitor his pressures.

## 2020-07-29 NOTE — Assessment & Plan Note (Addendum)
No LDL is less than 70 with documentation. Most recent labs from April outstanding-and rosuvastatin was reduced to 10 mg daily..    Continue current dose, will recheck lipids..  If his LDL goes back up again, will probably go back to 20 mg.

## 2020-07-29 NOTE — Assessment & Plan Note (Signed)
Remains on maintenance dose of Eliquis.  Managed by his PCP.  Being on Eliquis due to need for aspirin and Plavix with CAD.  Okay to hold off Eliquis 2 to 3 days preop for procedures or surgeries.

## 2020-07-29 NOTE — Assessment & Plan Note (Signed)
Notably improved symptoms after the primary symptom that he had was dyspnea.

## 2020-08-01 ENCOUNTER — Telehealth: Payer: Self-pay | Admitting: Cardiology

## 2020-08-01 NOTE — Telephone Encounter (Signed)
Attempted to contact patient to discuss.  Patient did not answer- LVM to call back.  Left call back number.

## 2020-08-01 NOTE — Telephone Encounter (Signed)
Pt called to report that since his visit with Dr. Ellyn Hack both of his ankles have ben swollen but he had held his Cipro given to him by his urologist staring yesterday morning and he has had some improvement. He is wearing his support hose and he has his legs elevated and he has a call in to his urologist to see if he can change the Cipro, to another antibiotic.   He says he was on it due to an elevated PSA and WBC in his urine.   Pt will let us know mid week if he has any more improvement or any worsening edema after stopping the Cipro for a few days. He denies shortness of breath.   His says his monitor fell of after just 39 ours and was suppose to wear it for 3 days... he will go ahead and mail it in and make a note on the diary that it fell off.   Will froward to dr. Ellyn Hack for review.

## 2020-08-01 NOTE — Telephone Encounter (Signed)
Patient states that he was suppose to have his monitor on for 3 days but it came off after 39 hours.  He wants to know if he needs to have another placed or would like be enough information.   Pt c/o medication issue:  1. Name of Medication: Ciprofloxacin 500mg   2. How are you currently taking this medication (dosage and times per day)? 2x's daily  3. Are you having a reaction (difficulty breathing--STAT)?  4. What is your medication issue? He thinks this is what is causing his swelling, he states that Dr. Ellyn Hack has noticed some swelling on his right leg when he was in last week. He states he still has the swelling going on.

## 2020-08-04 ENCOUNTER — Encounter: Payer: Self-pay | Admitting: Cardiology

## 2020-08-04 DIAGNOSIS — I493 Ventricular premature depolarization: Secondary | ICD-10-CM | POA: Diagnosis not present

## 2020-08-04 DIAGNOSIS — I472 Ventricular tachycardia: Secondary | ICD-10-CM | POA: Insufficient documentation

## 2020-08-04 DIAGNOSIS — I4729 Other ventricular tachycardia: Secondary | ICD-10-CM | POA: Insufficient documentation

## 2020-08-04 NOTE — Progress Notes (Signed)
   Predominant rhythm is sinus with a minimum heart rate of 71 bpm, maximum of 157 bpm and average of 98 bpm.  Rare PACs with 2 short bursts of SVT ranging 4-6 beats max rate 1 7 4  bpm.  Frequent PVCs (19.2%) with occasional couplets and rare triplets. Also ventricular bigeminy and trigeminy noted.  1 run of 7 beats nonsustained V. tach maximum rate to 50 bpm.  No patient diary noted.  24-hour Zio patch -> demonstrates significant amount of PVCs with one run of nonsustained V. tach and 2 short bursts of SVT.  We need to exclude ischemia and abnormal cardiac function.  Recommend 2D echocardiogram and Lexiscan Myoview -> for CAD-PCI, frequent PVCs, nonsustained V. tach   Glenetta Hew, MD

## 2020-08-08 DIAGNOSIS — F1020 Alcohol dependence, uncomplicated: Secondary | ICD-10-CM | POA: Diagnosis not present

## 2020-08-08 DIAGNOSIS — F411 Generalized anxiety disorder: Secondary | ICD-10-CM | POA: Diagnosis not present

## 2020-08-08 DIAGNOSIS — F332 Major depressive disorder, recurrent severe without psychotic features: Secondary | ICD-10-CM | POA: Diagnosis not present

## 2020-08-10 DIAGNOSIS — Z79899 Other long term (current) drug therapy: Secondary | ICD-10-CM | POA: Diagnosis not present

## 2020-08-10 DIAGNOSIS — E785 Hyperlipidemia, unspecified: Secondary | ICD-10-CM | POA: Diagnosis not present

## 2020-08-10 LAB — HEPATIC FUNCTION PANEL
ALT: 13 IU/L (ref 0–44)
AST: 19 IU/L (ref 0–40)
Albumin: 4.3 g/dL (ref 3.7–4.7)
Alkaline Phosphatase: 53 IU/L (ref 44–121)
Bilirubin Total: 0.5 mg/dL (ref 0.0–1.2)
Bilirubin, Direct: 0.14 mg/dL (ref 0.00–0.40)
Total Protein: 6.7 g/dL (ref 6.0–8.5)

## 2020-08-10 LAB — LIPID PANEL
Chol/HDL Ratio: 3 ratio (ref 0.0–5.0)
Cholesterol, Total: 92 mg/dL — ABNORMAL LOW (ref 100–199)
HDL: 31 mg/dL — ABNORMAL LOW (ref >39)
LDL Chol Calc (NIH): 38 mg/dL (ref 0–99)
Triglycerides: 128 mg/dL (ref 0–149)
VLDL Cholesterol Cal: 23 mg/dL (ref 5–40)

## 2020-08-11 ENCOUNTER — Ambulatory Visit: Payer: Medicare Other | Admitting: Gastroenterology

## 2020-08-11 ENCOUNTER — Encounter: Payer: Self-pay | Admitting: Gastroenterology

## 2020-08-11 ENCOUNTER — Telehealth: Payer: Self-pay

## 2020-08-11 VITALS — BP 120/66 | HR 53 | Ht 71.5 in | Wt 245.0 lb

## 2020-08-11 DIAGNOSIS — Z86010 Personal history of colonic polyps: Secondary | ICD-10-CM | POA: Diagnosis not present

## 2020-08-11 DIAGNOSIS — R1310 Dysphagia, unspecified: Secondary | ICD-10-CM | POA: Diagnosis not present

## 2020-08-11 DIAGNOSIS — M25811 Other specified joint disorders, right shoulder: Secondary | ICD-10-CM | POA: Diagnosis not present

## 2020-08-11 DIAGNOSIS — K222 Esophageal obstruction: Secondary | ICD-10-CM

## 2020-08-11 DIAGNOSIS — Z7901 Long term (current) use of anticoagulants: Secondary | ICD-10-CM | POA: Insufficient documentation

## 2020-08-11 DIAGNOSIS — Z800 Family history of malignant neoplasm of digestive organs: Secondary | ICD-10-CM

## 2020-08-11 DIAGNOSIS — Z8371 Family history of colonic polyps: Secondary | ICD-10-CM | POA: Insufficient documentation

## 2020-08-11 DIAGNOSIS — M25812 Other specified joint disorders, left shoulder: Secondary | ICD-10-CM | POA: Diagnosis not present

## 2020-08-11 MED ORDER — SUTAB 1479-225-188 MG PO TABS: 1.0000 | ORAL_TABLET | ORAL | 0 refills | Status: DC

## 2020-08-11 NOTE — Progress Notes (Addendum)
08/11/2020 [name] [id number] 1941-10-08   HISTORY OF PRESENT ILLNESS:  This is a pleasant 79 year old male who is new to our office.  He has been referred here by his PCP, Dr. Dema Severin, for evaluation of dysphagia.  He has a history of esophageal stricture that was dilated by Dr. Oletta Lamas only on one occasion in 2004.  He says that it helped tremendously and had not experienced any problems since then.  He says that symptoms began again about 2 months ago.  Solid food getting stuck on occasion.  No issues with swallowing pills or liquids.  He has a family history of colon cancer in his mother and says that she passed away from it.  His last colonoscopy was with [facility] well over 5 years ago according to his report.  He says that he has a history of polyps.  We do not have colonoscopy records.  He denies seeing blood in his stools.  Has some constipation that is medication induced but does well with Miralax daily and stool softeners.  Is on Eliquis life-long for history of PEs.  This is prescribed by Dr. Dema Severin.  He says that he's held it in the past with no issues.   Past Medical History:  Diagnosis Date  . Anxiety   . Arthritis    KNEES, NECK, SHOULDERS, HANDS  . Asthma   . Biceps tendon tear    right  . Cancer ([place])    skin cancer-  2 basal cell, 1 squamous cell  . Coronary artery disease   . GERD (gastroesophageal reflux disease)   . Gout, arthritis    BILATERAL GREAT TOE-- PER PT STABLE  . H/O transfusion of packed red blood cells    as a child/ s/p tonsillectomy  . History of pulmonary embolism    Maintained on lifelong anticoagulation with Eliquis.  . Lung nodule    BENIGN RIGHT UPPER--  CHEST CT 08-22-2011  . OSA (obstructive sleep apnea)    NO CPAP -states dr said possibly related to asthma  . S/P dilatation of esophageal stricture   . Stroke ([place])   . Torn rotator cuff    Right x 3   Past Surgical History:  Procedure Laterality Date  . CARPAL TUNNEL  RELEASE Right 2010  . CORONARY CALCIUM SCORING WITH CT CORONARY ANGIOGRAM  05/29/2018   Coronary calcium score 416.  Possible obstructive CAD in the mid circumflex-OM1-OM 2, D2 and a moderate disease in the proximal LAD -->  FFR --> LAD: Proximal.90, mid.86 distal.68 D2.80, Cx: Mid.76 OM2.74: Hemodynamically significant distal LAD, D2, mid circumflex and OM2 lesions  . CORONARY STENT INTERVENTION N/A 06/10/2018   Procedure: CORONARY STENT INTERVENTION;  Surgeon: Jettie Booze, MD;  Location: [facility];;  90% mLAD --> DES PCI (ORSIRO 2.75X18) => 0%  . JOINT REPLACEMENT    . LEFT HEART CATH AND CORONARY ANGIOGRAPHY N/A 06/10/2018   Procedure: LEFT HEART CATH AND CORONARY ANGIOGRAPHY;  Surgeon: Jettie Booze, MD;  Location: [facility];;  90% mLAD --> DES PCI.  pRCA 25%. p-m Cx 40%. Ost OM1 50%, ost OM2 40% with 50% mid.  pLAD 30% & ost D1 75 % (med Rx).  Normal LVEDP.   [name] NEPHRECTOMY    . SHOULDER ARTHROSCOPY Right   . TONSILLECTOMY  AGE 45  . TOTAL KNEE ARTHROPLASTY Left 10-15-2008  . TOTAL KNEE ARTHROPLASTY Right 01/18/2014   Procedure: RIGHT TOTAL KNEE ARTHROPLASTY;  Surgeon:  Gearlean Alf, MD;  Location: [facility];  Service: Orthopedics;  Laterality: Right;  . TRANSTHORACIC ECHOCARDIOGRAM  01/2018    Normal LV size with moderate LVH.  EF 60 to 65% with GR 1 DD (normal for age)  . WRIST GANGLION EXCISION Right 1964    reports that he quit smoking about 40 years ago. His smoking use included cigarettes. He has a 25.00 pack-year smoking history. He has never used smokeless tobacco. He reports current alcohol use. He reports that he does not use drugs. family history includes Colon cancer in his mother; Deep vein thrombosis in his father; Stroke in his brother and father. Allergies  Allergen Reactions  . Garlic Anaphylaxis  . Onion Anaphylaxis  . Codeine Other (See Comments)    "PASSES OUT"  . Hydrocodone     Pt states that he is allergic to synthetic hydrocodone.  .  Novocain [Procaine] Nausea Only      Outpatient Encounter Medications as of 08/11/2020  Medication Sig  . allopurinol (ZYLOPRIM) 300 MG tablet Take 300 mg by mouth daily.  [name] ALPRAZolam (XANAX) 0.5 MG tablet Take 1 tablet (0.5 mg total) by mouth 2 (two) times daily as needed for anxiety.  [name] apixaban (ELIQUIS) 5 MG TABS tablet Take 5 mg by mouth 2 (two) times daily.  [name] escitalopram (LEXAPRO) 5 MG tablet Take 10 mg by mouth daily. Patient takes 10 mg daily  . gabapentin (NEURONTIN) 300 MG capsule Take 300 mg by mouth 3 (three) times daily. 300 mg in am, 300 mg at noon and 600 at bedtime  . methocarbamol (ROBAXIN) 750 MG tablet Take 750 mg by mouth 3 (three) times daily.  . Multiple Vitamin (MULTIVITAMIN WITH MINERALS) TABS tablet Take 1 tablet by mouth daily.  . nitroGLYCERIN (NITROSTAT) 0.4 MG SL tablet Place 1 tablet (0.4 mg total) under the tongue every 5 (five) minutes as needed for chest pain.  [name] ondansetron (ZOFRAN) 4 MG tablet Take 1 tablet (4 mg total) by mouth every 8 (eight) hours as needed for nausea or vomiting.  . Probiotic Product (PROBIOTIC DAILY PO) Take 1 capsule by mouth daily.  . rosuvastatin (CRESTOR) 20 MG tablet Take 10 mg by mouth daily.  . tadalafil (CIALIS) 20 MG tablet Take 20 mg by mouth daily as needed for erectile dysfunction. Patient takes as needed  . testosterone cypionate (DEPOTESTOSTERONE CYPIONATE) 200 MG/ML injection Inject 200 mg into the muscle once a week. 1 ml weekly  . traMADol-acetaminophen (ULTRACET) 37.5-325 MG tablet Take 1 tablet by mouth in the morning, at noon, and at bedtime.  . valsartan (DIOVAN) 320 MG tablet Take 320 mg by mouth daily.  . Sodium Sulfate-Mag Sulfate-KCl (SUTAB) [phone] MG TABS Take 1 kit by mouth as directed.   No facility-administered encounter medications on file as of 08/11/2020.     REVIEW OF SYSTEMS  : All other systems reviewed and negative except where noted in the History of Present Illness.   PHYSICAL EXAM: BP  120/66   Pulse (!) 53   Ht 5' 11.5" (1.816 m)   Wt 245 lb (111.1 kg)   BMI 33.69 kg/m  General: Well developed white male in no acute distress Head: Normocephalic and atraumatic Eyes:  Sclerae anicteric, conjunctiva pink. Ears: Normal auditory acuity Lungs: Clear throughout to auscultation; no W/R/R. Heart: Regular rate and rhythm; no M/R/G. Abdomen: Soft, non-distended.  BS present.  Non-tender.  Ventral hernia/diastasis recti noted.   Rectal:  Will be done at the time of colonoscopy. Musculoskeletal:  Symmetrical with no gross deformities  Skin: No lesions on visible extremities Extremities: No edema  Neurological: Alert oriented x 4, grossly non-focal Psychological:  Alert and cooperative. Normal mood and affect  ASSESSMENT AND PLAN: *Dysphagia, history of esophageal stricture dilated in 2004:  Now with recurrent similar symptoms for the past 2 months, intermittent.  Will plan for EGD with dilation with Dr. Fuller Plan. *Personal history of colon polyps and family history of colon cancer in his mother:  He tells me that his last colonoscopy was over 5 years ago with [facility].  Will try to get records but will schedule for colonoscopy with Dr. Fuller Plan as well. *Chronic anticoagulation with Eliquis for history of PEs:  Will hold Eliquis for 2 days prior to endoscopic procedures - will instruct when and how to resume after procedure. Benefits and risks of procedure explained including risks of bleeding, perforation, infection, missed lesions, reactions to medications and possible need for hospitalization and surgery for complications. Additional rare but real risk of stroke or other vascular clotting events off of Eliquis also explained and need to seek urgent help if any signs of these problems occur. Will communicate by phone or EMR with patient's prescribing provider, Dr. Dema Severin, to confirm that holding Eliquis reasonable in this case.   **The risks, benefits, and alternatives to EGD and colonoscopy  were discussed with the patient and he consents to proceed.   **Addendum:  Last colonoscopy was performed by Dr. Oletta Lamas in March 2010 at which time he was found to have 3 polyps that were removed, one of them being a tubular adenoma removed from the cecum.  Others were polypoid colorectal mucosa without adenomatous changes.  He also had medium size internal hemorrhoids.  Records being sent for scanning.   CC:  Harlan Stains, MD

## 2020-08-11 NOTE — Patient Instructions (Signed)
If you are age 77 or older, your body mass index should be between 23-30. Your Body mass index is 33.69 kg/m. If this is out of the aforementioned range listed, please consider follow up with your Primary Care Provider.  If you are age 58 or younger, your body mass index should be between 19-25. Your Body mass index is 33.69 kg/m. If this is out of the aformentioned range listed, please consider follow up with your Primary Care Provider.   You have been scheduled for an endoscopy and colonoscopy. Please follow the written instructions given to you at your visit today. Please pick up your prep supplies at the pharmacy within the next 1-3 days. If you use inhalers (even only as needed), please bring them with you on the day of your procedure.  You will be contacted by our office prior to your procedure for directions on holding your Eliquis.  If you do not hear from our office 1 week prior to your scheduled procedure, please call [phone] to discuss.   Follow up pending the results of your Colonoscopy/Endoscopy.

## 2020-08-11 NOTE — Progress Notes (Signed)
Reviewed and agree with management plan.  Tumeka Chimenti T. Jerri Hargadon, MD FACG [facility]  

## 2020-08-12 DIAGNOSIS — I2510 Atherosclerotic heart disease of native coronary artery without angina pectoris: Secondary | ICD-10-CM

## 2020-08-12 DIAGNOSIS — I493 Ventricular premature depolarization: Secondary | ICD-10-CM

## 2020-08-12 DIAGNOSIS — Z9861 Coronary angioplasty status: Secondary | ICD-10-CM

## 2020-08-13 NOTE — Telephone Encounter (Signed)
Probably okay for you to have a prostate biopsy.  That is a relatively low risk procedure and it should be fine with doing that.  He would need to hold the Eliquis 2 days prior to it.  It would be nice to have at least the Myoview if not both the echo and the Myoview done prior to this.   Glenetta Hew, MD  ===View-only below this line===   ----- Message -----    From: [name]    Sent: 08/12/2020  4:43 PM EDT      To: Glenetta Hew, MD Subject: 2D echocardiogram and Lexiscan Myoview tests  I need to have a prostate diopicy due to a high PSA and an endoscopy and colonoscopy. Both are set now for late Oct and early Nov. Due you think it is wise to complete these tests before those procedures? I can postpone them if that is best. [name]

## 2020-08-17 ENCOUNTER — Other Ambulatory Visit: Payer: Self-pay

## 2020-08-17 ENCOUNTER — Ambulatory Visit (HOSPITAL_COMMUNITY): Payer: Medicare Other | Attending: Cardiovascular Disease

## 2020-08-17 DIAGNOSIS — Z9861 Coronary angioplasty status: Secondary | ICD-10-CM

## 2020-08-17 DIAGNOSIS — I2510 Atherosclerotic heart disease of native coronary artery without angina pectoris: Secondary | ICD-10-CM | POA: Diagnosis not present

## 2020-08-17 DIAGNOSIS — I493 Ventricular premature depolarization: Secondary | ICD-10-CM | POA: Diagnosis not present

## 2020-08-17 HISTORY — PX: TRANSTHORACIC ECHOCARDIOGRAM: SHX275

## 2020-08-17 LAB — ECHOCARDIOGRAM COMPLETE
Area-P 1/2: 5.16 cm2
MV M vel: 5.47 m/s
MV Peak grad: 119.7 mmHg
S' Lateral: 2.6 cm

## 2020-08-17 NOTE — Telephone Encounter (Signed)
Contacted patient to advise that we have received confirmation from his primary care provider about stopping his Eliquis. I have advised him that we need to start holding his Eliquis on 09/05/20 and should be able to resume after his procedure. He did have an Echo today with Cardiology today, he awaiting his results.

## 2020-08-18 DIAGNOSIS — H903 Sensorineural hearing loss, bilateral: Secondary | ICD-10-CM | POA: Diagnosis not present

## 2020-08-22 DIAGNOSIS — R972 Elevated prostate specific antigen [PSA]: Secondary | ICD-10-CM | POA: Diagnosis not present

## 2020-09-01 ENCOUNTER — Telehealth: Payer: Self-pay | Admitting: *Deleted

## 2020-09-01 DIAGNOSIS — R3915 Urgency of urination: Secondary | ICD-10-CM | POA: Diagnosis not present

## 2020-09-01 DIAGNOSIS — N401 Enlarged prostate with lower urinary tract symptoms: Secondary | ICD-10-CM | POA: Diagnosis not present

## 2020-09-01 DIAGNOSIS — R972 Elevated prostate specific antigen [PSA]: Secondary | ICD-10-CM | POA: Diagnosis not present

## 2020-09-01 DIAGNOSIS — Z9861 Coronary angioplasty status: Secondary | ICD-10-CM

## 2020-09-01 DIAGNOSIS — I2510 Atherosclerotic heart disease of native coronary artery without angina pectoris: Secondary | ICD-10-CM

## 2020-09-01 DIAGNOSIS — I493 Ventricular premature depolarization: Secondary | ICD-10-CM

## 2020-09-01 DIAGNOSIS — R9431 Abnormal electrocardiogram [ECG] [EKG]: Secondary | ICD-10-CM

## 2020-09-01 NOTE — Telephone Encounter (Signed)
Called left message  To informed patient exercise myoview  Has been order . Office will contact patient.  patient will need to have covid test 3 days prior to exercise myoview.

## 2020-09-02 ENCOUNTER — Telehealth: Payer: Self-pay | Admitting: *Deleted

## 2020-09-02 NOTE — Telephone Encounter (Signed)
Communicated with [name], CMA with GI and patient is good to go for his procedure on 09/07/20.  He is aware per PCP to start holding his Eliquis on 11/01.  Confirmed with patient and he is aware all is taken care of for him to proceed 11/3.  He was appreciative of my call.

## 2020-09-05 ENCOUNTER — Telehealth: Payer: Self-pay | Admitting: Gastroenterology

## 2020-09-05 NOTE — Telephone Encounter (Signed)
Hi Dr. Fuller Plan,  Patient was scheduled for 09/07/2020 for his procedure. Patient called to cancel stated he no longer wants to proceed with having it done.

## 2020-09-06 ENCOUNTER — Other Ambulatory Visit (HOSPITAL_COMMUNITY)
Admission: RE | Admit: 2020-09-06 | Discharge: 2020-09-06 | Disposition: A | Discharge status: Home / Self Care | Payer: Medicare Other | Source: Ambulatory Visit | Attending: Cardiology | Admitting: Cardiology

## 2020-09-06 DIAGNOSIS — Z20822 Contact with and (suspected) exposure to covid-19: Secondary | ICD-10-CM | POA: Diagnosis not present

## 2020-09-06 DIAGNOSIS — Z01812 Encounter for preprocedural laboratory examination: Secondary | ICD-10-CM | POA: Diagnosis not present

## 2020-09-06 LAB — SARS CORONAVIRUS 2 (TAT 6-24 HRS): SARS Coronavirus 2: NEGATIVE

## 2020-09-07 ENCOUNTER — Encounter: Payer: Medicare Other | Admitting: Gastroenterology

## 2020-09-08 ENCOUNTER — Telehealth (HOSPITAL_COMMUNITY): Payer: Self-pay | Admitting: *Deleted

## 2020-09-08 NOTE — Telephone Encounter (Signed)
Close encounter 

## 2020-09-09 ENCOUNTER — Ambulatory Visit (HOSPITAL_COMMUNITY)
Admission: RE | Admit: 2020-09-09 | Discharge: 2020-09-09 | Disposition: A | Discharge status: Home / Self Care | Payer: Medicare Other | Source: Ambulatory Visit | Attending: Cardiology | Admitting: Cardiology

## 2020-09-09 ENCOUNTER — Other Ambulatory Visit: Payer: Self-pay

## 2020-09-09 DIAGNOSIS — I493 Ventricular premature depolarization: Secondary | ICD-10-CM | POA: Diagnosis not present

## 2020-09-09 DIAGNOSIS — R9431 Abnormal electrocardiogram [ECG] [EKG]: Secondary | ICD-10-CM | POA: Insufficient documentation

## 2020-09-09 DIAGNOSIS — Z9861 Coronary angioplasty status: Secondary | ICD-10-CM | POA: Diagnosis not present

## 2020-09-09 DIAGNOSIS — I2510 Atherosclerotic heart disease of native coronary artery without angina pectoris: Secondary | ICD-10-CM | POA: Insufficient documentation

## 2020-09-09 HISTORY — PX: NM MYOVIEW LTD: HXRAD82

## 2020-09-09 LAB — MYOCARDIAL PERFUSION IMAGING
Estimated workload: 6.4 METS
Exercise duration (min): 5 min
Exercise duration (sec): 21 s
LV dias vol: 125 mL (ref 62–150)
LV sys vol: 63 mL
MPHR: 143 {beats}/min
Peak HR: 139 {beats}/min
Percent HR: 97 %
Rest HR: 93 {beats}/min
SDS: 2
SRS: 5
SSS: 7
TID: 0.99

## 2020-09-09 MED ORDER — TECHNETIUM TC 99M TETROFOSMIN IV KIT
28.2000 | PACK | Freq: Once | INTRAVENOUS | Status: AC | PRN
  Administered 2020-09-09: 28.2 via INTRAVENOUS
  Filled 2020-09-09: qty 29

## 2020-09-09 MED ORDER — TECHNETIUM TC 99M TETROFOSMIN IV KIT
10.3000 | PACK | Freq: Once | INTRAVENOUS | Status: AC | PRN
  Administered 2020-09-09: 10.3 via INTRAVENOUS
  Filled 2020-09-09: qty 11

## 2020-09-14 NOTE — Progress Notes (Signed)
Virtual Visit via Telephone Note   This visit type was conducted due to national recommendations for restrictions regarding the COVID-19 Pandemic (e.g. social distancing) in an effort to limit this patient's exposure and mitigate transmission in our community.  Due to his co-morbid illnesses, this patient is at least at moderate risk for complications without adequate follow up.  This format is felt to be most appropriate for this patient at this time.  The patient did not have access to video technology/had technical difficulties with video requiring transitioning to audio format only (telephone).  All issues noted in this document were discussed and addressed.  No physical exam could be performed with this format.  Please refer to the patient's chart for his  consent to telehealth for [facility]   Patient has given verbal permission to conduct this visit via virtual appointment and to bill insurance 09/19/2020 12:18 AM     Evaluation Performed:  Follow-up visit  Date:  09/19/2020   ID:  [name], DOB 1941/10/20, MRN [id number]  Patient Location: Home Provider Location: Office/Clinic  PCP:  Harlan Stains, MD  Cardiologist:  Glenetta Hew, MD  Electrophysiologist:  None   Chief Complaint:   Chief Complaint  Patient presents with  . Follow-up    Event monitor, echo and Myoview -> all to evaluate frequent PVCs.  . Coronary Artery Disease    No angina since PCI    History of Present Illness:    [name] is a 79 y.o. male with PMH notable for single-vessel CAD found on coronary CTA (S/P PCI-LAD), and recurrent DVT and PE, OSA with prior CVA who presents via audio/video conferencing for a telehealth visit today to discuss results of his event monitor, echocardiogram and Myoview..  Problem List Items Addressed This Visit    CAD S/P percutaneous coronary angioplasty (Chronic)   Nonsustained ventricular tachycardia (HCC) (Chronic)   Essential hypertension  (Chronic)   Hyperlipidemia with target LDL less than 70 (Chronic)   History of pulmonary embolism (Chronic)   Frequent unifocal PVCs - Primary (Chronic)     [name] was last seen July 25, 2020 as an annual follow-up.  He was doing well.  Very active doing odd jobs around the house including building a fence and deck.  No vigorous exercise but, but always on the go.  No angina.  Every now and then has pedal edema if he sitting for long period time.  No sensation of significant palpitations just occasional intermittent skipping beats. -> BP was borderline high, but he was in a rush.  We ordered a 24-hour Zio patch to evaluate PVCs seen on EKG. ->  With the results of this study, we then proceeded with a Myoview and echocardiogram.  Hospitalizations:  . None  Recent - Interim CV studies:   The following studies were reviewed today: I have personally reviewed the images of the studies and read the 24-hour monitor.  I have discussed the results of the imaging studies with an noninvasive colleague. . 24 hr Zio Patch (08/04/2020): demonstrates significant amount of PVCs with one run of nonsustained V. tach and 2 short bursts of SVT.  - --> Recommend 2D echocardiogram and Lexiscan Myoview -> for CAD-PCI, frequent PVCs, nonsustained V. tach  Predominant rhythm is sinus with a minimum heart rate of 71 bpm, maximum of 157 bpm and average of 98 bpm.  Rare PACs with 2 short bursts of SVT ranging 4-6 beats max rate 174 bpm.  Frequent PVCs (  19.2%) with occasional couplets and rare triplets. Also ventricular bigeminy and trigeminy noted.  1 run of 7 beats nonsustained V. tach maximum rate to 50 bpm.  No patient diary noted.  TTE (08/17/2020): EF 60 to 65%. No R WMA.  ? GR 1 DD-read as severe concentric LVH, but is not severe LVH (on relook, wall thickness is normal).  But normal left atrial size and right atrial size.  Normal valves..   Myoview (09/09/2020): Exercised 5:21-min, 6.4 METS.   Peak HR 139 bpm equals 97% MPHR-143.  EF ~50%.  Occasional PVCs.  Stopped because of fatigue and dyspnea.  No angina.  Medium size, severe (fixed) basal inferior mid inferior defect. ?LOW RISK?[name]  Cannot exclude prior inferior infarct versus prominent diaphragmatic attenuation.  No evidence of ischemia.  EF appears greater than calculated.  Consider echo.  (Echo seen above with normal wall motion.)   Inerval History   [name] returns to discuss results of the studies.  He is doing very well.  He has no major complaints whatsoever of any chest pain or pressure.  No sensation of irregular heartbeats or palpitations.  No sensation of tachycardia.  He actually is feeling well, exercising routinely.  He lists his blood pressures both of which have been much better controlled than before.  A little bit perplexed about his heart rate being fast.  Cardiovascular ROS: no chest pain or dyspnea on exertion positive for - Short of breath bending over.  Maybe a little bit if he overexerts. negative for - chest pain, dyspnea on exertion, edema, irregular heartbeat, orthopnea, palpitations, paroxysmal nocturnal dyspnea, rapid heart rate, shortness of breath or Lightheadedness or dizziness, syncope/near syncope or TIA/amaurosis fugax.   ROS:  Please see the history of present illness.    The patient does not have symptoms concerning for COVID-19 infection (fever, chills, cough, or new shortness of breath).  Review of Systems  Constitutional: Negative for malaise/fatigue and weight loss.  HENT: Negative for nosebleeds.   Gastrointestinal: Negative for blood in stool and melena.  Genitourinary: Negative for hematuria.  Musculoskeletal: Positive for joint pain (Still has some shoulder pain).  Psychiatric/Behavioral: Negative for memory loss. The patient is not nervous/anxious and does not have insomnia.    The patient is practicing social distancing.  Past Medical History:  Diagnosis Date  . Anxiety    . Arthritis    KNEES, NECK, SHOULDERS, HANDS  . Asthma   . Biceps tendon tear    right  . Cancer ([place])    skin cancer-  2 basal cell, 1 squamous cell  . Coronary artery disease   . GERD (gastroesophageal reflux disease)   . Gout, arthritis    BILATERAL GREAT TOE-- PER PT STABLE  . H/O transfusion of packed red blood cells    as a child/ s/p tonsillectomy  . History of pulmonary embolism    Maintained on lifelong anticoagulation with Eliquis.  . Lung nodule    BENIGN RIGHT UPPER--  CHEST CT 08-22-2011  . OSA (obstructive sleep apnea)    NO CPAP -states dr said possibly related to asthma  . S/P dilatation of esophageal stricture   . Stroke ([place])   . Torn rotator cuff    Right x 3   Past Surgical History:  Procedure Laterality Date  . CARPAL TUNNEL RELEASE Right 2010  . CORONARY CALCIUM SCORING WITH CT CORONARY ANGIOGRAM  05/29/2018   Coronary calcium score 416.  Possible obstructive CAD in the mid circumflex-OM1-OM 2, D2  and a moderate disease in the proximal LAD -->  FFR --> LAD: Proximal.90, mid.86 distal.68 D2.80, Cx: Mid.76 OM2.74: Hemodynamically significant distal LAD, D2, mid circumflex and OM2 lesions  . CORONARY STENT INTERVENTION N/A 06/10/2018   Procedure: CORONARY STENT INTERVENTION;  Surgeon: Jettie Booze, MD;  Location: [place] INVASIVE CV LAB;;  90% mLAD --> DES PCI (ORSIRO 2.75X18) => 0%  . JOINT REPLACEMENT    . LEFT HEART CATH AND CORONARY ANGIOGRAPHY N/A 06/10/2018   Procedure: LEFT HEART CATH AND CORONARY ANGIOGRAPHY;  Surgeon: Jettie Booze, MD;  Location: [facility]AS[facility] CV LAB;;  90% mLAD --> DES PCI.  pRCA 25%. p-m Cx 40%. Ost OM1 50%, ost OM2 40% with 50% mid.  pLAD 30% & ost D1 75 % (med Rx).  Normal LVEDP.   [name] NEPHRECTOMY    . SHOULDER ARTHROSCOPY Right   . TONSILLECTOMY  AGE 89  . TOTAL KNEE ARTHROPLASTY Left 10-15-2008  . TOTAL KNEE ARTHROPLASTY Right 01/18/2014   Procedure: RIGHT TOTAL KNEE ARTHROPLASTY;  Surgeon: Gearlean Alf, MD;  Location: [facility]  ORS;  Service: Orthopedics;  Laterality: Right;  . TRANSTHORACIC ECHOCARDIOGRAM  01/2018    Normal LV size with moderate LVH.  EF 60 to 65% with GR 1 DD (normal for age)  . WRIST GANGLION EXCISION Right 1964     Cardiac Cath-PCI 06/10/2018:90% mLAD -->DES PCI (ORSIRO 2.75X18) =>0%. pRCA 25%. p-m Cx 40%. Ost OM1 50%, ost OM2 40% with 50% mid. pLAD 30% &ost D1 75 % (med Rx). Normal LVEDP. Intervention Orsiro DES 2.75 x 18     Current Meds  Medication Sig  . allopurinol (ZYLOPRIM) 300 MG tablet Take 300 mg by mouth daily.  [name] ALPRAZolam (XANAX) 0.5 MG tablet Take 1 tablet (0.5 mg total) by mouth 2 (two) times daily as needed for anxiety.  [name] apixaban (ELIQUIS) 5 MG TABS tablet Take 5 mg by mouth 2 (two) times daily.  [name] escitalopram (LEXAPRO) 5 MG tablet Take 10 mg by mouth daily. Patient takes 10 mg daily  . gabapentin (NEURONTIN) 300 MG capsule Take 300 mg by mouth 3 (three) times daily. 300 mg in am, 300 mg at noon and 600 at bedtime  . methocarbamol (ROBAXIN) 750 MG tablet Take 750 mg by mouth 3 (three) times daily.  . Multiple Vitamin (MULTIVITAMIN WITH MINERALS) TABS tablet Take 1 tablet by mouth daily.  . nitroGLYCERIN (NITROSTAT) 0.4 MG SL tablet Place 1 tablet (0.4 mg total) under the tongue every 5 (five) minutes as needed for chest pain.  [name] ondansetron (ZOFRAN) 4 MG tablet Take 1 tablet (4 mg total) by mouth every 8 (eight) hours as needed for nausea or vomiting.  . Probiotic Product (PROBIOTIC DAILY PO) Take 1 capsule by mouth daily.  . rosuvastatin (CRESTOR) 20 MG tablet Take 10 mg by mouth daily.  . tadalafil (CIALIS) 20 MG tablet Take 20 mg by mouth daily as needed for erectile dysfunction. Patient takes as needed  . testosterone cypionate (DEPOTESTOSTERONE CYPIONATE) 200 MG/ML injection Inject 200 mg into the muscle once a week. 1 ml weekly  . traMADol-acetaminophen (ULTRACET) 37.5-325 MG tablet Take 2 tablets by mouth in the morning, at noon, and at bedtime.   .  [DISCONTINUED] valsartan (DIOVAN) 320 MG tablet Take 320 mg by mouth daily.     Allergies:   Garlic, Onion, Codeine, Hydrocodone, and Novocain [procaine]   Social History   Tobacco Use  . Smoking status: Former Smoker    Packs/day: 1.00  Years: 25.00    Pack years: 25.00    Types: Cigarettes    Quit date: 07/13/1980    Years since quitting: 40.2  . Smokeless tobacco: Never Used  Vaping Use  . Vaping Use: Never used  Substance Use Topics  . Alcohol use: Yes    Comment: RARE  . Drug use: No     Family Hx: The patient's family history includes Colon cancer in his mother; Deep vein thrombosis in his father; Stroke in his brother and father. There is no history of Migraines, Pancreatic cancer, or Stomach cancer.   Labs/Other Tests and Data Reviewed:    EKG:  No ECG reviewed.  Recent Labs: 07/12/2020: BUN 16; Creatinine, Ser 1.44; Hemoglobin 16.8; Platelets 219; Potassium 4.4; Sodium 140 08/10/2020: ALT 13   Recent Lipid Panel Lab Results  Component Value Date/Time   CHOL 92 (L) 08/10/2020 08:14 AM   TRIG 128 08/10/2020 08:14 AM   HDL 31 (L) 08/10/2020 08:14 AM   CHOLHDL 3.0 08/10/2020 08:14 AM   CHOLHDL 4.5 07/05/2014 07:36 AM   LDLCALC 38 08/10/2020 08:14 AM    Wt Readings from Last 3 Encounters:  09/15/20 232 lb (105.2 kg)  09/09/20 245 lb (111.1 kg)  08/11/20 245 lb (111.1 kg)     Objective:    Vital Signs:  BP 110/67   Pulse (!) 106   Ht 5' 11.5" (1.816 m)   Wt 232 lb (105.2 kg)   BMI 31.91 kg/m  ; 124/65 mmHg VITAL SIGNS:  reviewed Pleasant male in no acute distress. A&O x 3.  Normal Mood & Affect Non-labored respirations   ASSESSMENT & PLAN:    Problem List Items Addressed This Visit    CAD S/P percutaneous coronary angioplasty (Chronic)    Outstanding symptomatic relief with PCI of the LAD.  He is on lifelong Eliquis for recurrent pulmonary embolism and stroke.  We therefore stopped Plavix and aspirin.  I have restarted beta-blocker-(nadolol  40 mg daily) heart rates are now faster, frequent PVCs and nonsustained V. Tach. Reducing dose of ARB, but continuing valsartan at 160 mg daily.. Continue current dose of simvastatin.      Relevant Medications   nadolol (CORGARD) 40 MG tablet   valsartan (DIOVAN) 160 MG tablet   Other Relevant Orders   Comprehensive metabolic panel   Magnesium   Nonsustained ventricular tachycardia (HCC) - Primary (Chronic)    Very interesting that he has had frequent PVCs and nonsustained VT without any symptoms. Echocardiogram was very unremarkable, but the Myoview was a little unsettling.  I personally reviewed it and felt as though there was a fixed inferior defect, but there was no wall motion normality indicated by the reader which was corroborated by the echocardiogram showing no wall motion abnormality.  In this setting a fixed defect with wall motion would suggest artifact.  I had 2 additional readers review the images both the echo and the stress test and felt that there was no evidence of LVH, and that most likely this was indeed artifact in the Myoview.  They agreed with readers assessment as LOW RISK.  I explained this phenomenon to [name] in detail, going through both the Myoview and Echo as well as the monitor results.  At this point, I think we can hold off on further ischemic evaluation with cardiac catheterization and some he is totally asymptomatic with no regional wall motion abnormalities with an unusual amount of artifact on Myoview.  Plan:  With frequent PVCs and NSVT, will  refer to Electrophysiology to consider the possibility of PVC ablation or any particular antiarrhythmic.  Check CMP with magnesium  We will start beta-blocker (he has a resting heart rate 106 bpm today), but will reduce valsartan dose   Start nadolol 40 mg daily (with potential to titrate further)  Reduce valsartan to 1/2 tablet      Relevant Medications   nadolol (CORGARD) 40 MG tablet   valsartan (DIOVAN)  160 MG tablet   Other Relevant Orders   Ambulatory referral to Cardiac Electrophysiology   Comprehensive metabolic panel   Magnesium   Essential hypertension (Chronic)    Blood pressure looks much better today.  He was monitoring his pressures at home and they have all been relatively the same as what they have been reading. As such, with the addition of nadolol, we will have to reduce his ARB dose.   Decrease valsartan dose to 1/2 tablet  Start nadolol 40 mg      Relevant Medications   nadolol (CORGARD) 40 MG tablet   valsartan (DIOVAN) 160 MG tablet   Hyperlipidemia with target LDL less than 70 (Chronic)    LDL is 38 as of October.  Doing well on 10 mg rosuvastatin.  No change.      Relevant Medications   nadolol (CORGARD) 40 MG tablet   valsartan (DIOVAN) 160 MG tablet   History of pulmonary embolism (Chronic)   Frequent unifocal PVCs (Chronic)    See prolonged discussion with nonsustained VT.  Plan: Refer to EP  Start nadolol, and reduce ARB dose  Check CMP with magnesium      Relevant Medications   nadolol (CORGARD) 40 MG tablet   valsartan (DIOVAN) 160 MG tablet   Other Relevant Orders   Ambulatory referral to Cardiac Electrophysiology   Comprehensive metabolic panel   Magnesium      COVID-19 Education: The signs and symptoms of COVID-19 were discussed with the patient and how to seek care for testing (follow up with PCP or arrange E-visit).   The importance of social distancing was discussed today.  Time:   Today, I have spent 22 minutes with the patient with telehealth technology discussing the above problems.     Additional 25 minutes spent charting and reviewing studies independently along with 10 minutes discussing the case with noninvasive colleague.    Total time spent 47 minutes   Medication Adjustments/Labs and Tests Ordered: Current medicines are reviewed at length with the patient today.  Concerns regarding medicines are outlined above.    Patient Instructions  Medication Instructions:    Reduce the valsartan dose to 1/2 tablet mg 320 mg   Start nadolol 40 mg by mouth daily _Rx 40 mg p.o. daily, dispense #30, 3 refills.  *If you need a refill on your cardiac medications before your next appointment, please call your pharmacy*   Lab Work:  Check CMP with magnesium prior to EP visit  If you have labs (blood work) drawn today and your tests are completely normal, you will receive your results only by: [name] MyChart Message (if you have MyChart) OR . A paper copy in the mail If you have any lab test that is abnormal or we need to change your treatment, we will call you to review the results.   Testing/Procedures: No testing for now   Follow-Up: At [facility], you and your health needs are our priority.  As part of our continuing mission to provide you with exceptional heart care, we have created designated  Provider Care Teams.  These Care Teams include your primary Cardiologist (physician) and Advanced Practice Providers (APPs -  Physician Assistants and Nurse Practitioners) who all work together to provide you with the care you need, when you need it.    Your next appointment:   3 month(s)  The format for your next appointment:   Can be in person or virtual  Provider:   Glenetta Hew, MD   Other Instructions Referral to electrophysiology -> Dr. Lars Mage ,Dr. Allegra Lai, Dr. Cristopher Peru     Signed, Glenetta Hew, MD  09/19/2020 12:18 AM    [place]

## 2020-09-15 ENCOUNTER — Encounter: Payer: Self-pay | Admitting: Cardiology

## 2020-09-15 ENCOUNTER — Telehealth (INDEPENDENT_AMBULATORY_CARE_PROVIDER_SITE_OTHER): Payer: Medicare Other | Admitting: Cardiology

## 2020-09-15 ENCOUNTER — Telehealth: Payer: Self-pay | Admitting: *Deleted

## 2020-09-15 VITALS — BP 110/67 | HR 106 | Ht 71.5 in | Wt 232.0 lb

## 2020-09-15 DIAGNOSIS — I2510 Atherosclerotic heart disease of native coronary artery without angina pectoris: Secondary | ICD-10-CM

## 2020-09-15 DIAGNOSIS — Z86711 Personal history of pulmonary embolism: Secondary | ICD-10-CM

## 2020-09-15 DIAGNOSIS — Z0000 Encounter for general adult medical examination without abnormal findings: Secondary | ICD-10-CM | POA: Diagnosis not present

## 2020-09-15 DIAGNOSIS — I10 Essential (primary) hypertension: Secondary | ICD-10-CM

## 2020-09-15 DIAGNOSIS — N183 Chronic kidney disease, stage 3 unspecified: Secondary | ICD-10-CM | POA: Diagnosis not present

## 2020-09-15 DIAGNOSIS — Z1159 Encounter for screening for other viral diseases: Secondary | ICD-10-CM | POA: Diagnosis not present

## 2020-09-15 DIAGNOSIS — I472 Ventricular tachycardia: Secondary | ICD-10-CM

## 2020-09-15 DIAGNOSIS — E785 Hyperlipidemia, unspecified: Secondary | ICD-10-CM

## 2020-09-15 DIAGNOSIS — I4729 Other ventricular tachycardia: Secondary | ICD-10-CM

## 2020-09-15 DIAGNOSIS — I493 Ventricular premature depolarization: Secondary | ICD-10-CM

## 2020-09-15 DIAGNOSIS — I129 Hypertensive chronic kidney disease with stage 1 through stage 4 chronic kidney disease, or unspecified chronic kidney disease: Secondary | ICD-10-CM | POA: Diagnosis not present

## 2020-09-15 DIAGNOSIS — Z9861 Coronary angioplasty status: Secondary | ICD-10-CM

## 2020-09-15 MED ORDER — NADOLOL 40 MG PO TABS: 40.0000 mg | ORAL_TABLET | Freq: Every day | ORAL | 3 refills | Status: DC

## 2020-09-15 MED ORDER — VALSARTAN 160 MG PO TABS: 160.0000 mg | ORAL_TABLET | Freq: Every day | ORAL | 3 refills | Status: AC

## 2020-09-15 NOTE — Patient Instructions (Addendum)
Medication Instructions:    Reduce the valsartan dose to 1/2 tablet mg 320 mg   Start nadolol 40 mg by mouth daily _Rx 40 mg p.o. daily, dispense #30, 3 refills.  *If you need a refill on your cardiac medications before your next appointment, please call your pharmacy*   Lab Work:  Check CMP with magnesium prior to EP visit  If you have labs (blood work) drawn today and your tests are completely normal, you will receive your results only by: [name] MyChart Message (if you have MyChart) OR . A paper copy in the mail If you have any lab test that is abnormal or we need to change your treatment, we will call you to review the results.   Testing/Procedures: No testing for now   Follow-Up: At [facility], you and your health needs are our priority.  As part of our continuing mission to provide you with exceptional heart care, we have created designated Provider Care Teams.  These Care Teams include your primary Cardiologist (physician) and Advanced Practice Providers (APPs -  Physician Assistants and Nurse Practitioners) who all work together to provide you with the care you need, when you need it.    Your next appointment:   3 month(s)  The format for your next appointment:   Can be in person or virtual  Provider:   Glenetta Hew, MD   Other Instructions Referral to electrophysiology -> Dr. Lars Mage ,Dr. Allegra Lai, Dr. Cristopher Peru

## 2020-09-15 NOTE — Telephone Encounter (Signed)
°  Patient Consent for Virtual Visit         [name] has provided verbal consent on 09/15/2020 for a virtual visit (video or telephone).   CONSENT FOR VIRTUAL VISIT FOR:  [name]  By participating in this virtual visit I agree to the following:  I hereby voluntarily request, consent and authorize [facility] and its employed or contracted physicians, physician assistants, nurse practitioners or other licensed health care professionals (the Practitioner), to provide me with telemedicine health care services (the Services") as deemed necessary by the treating Practitioner. I acknowledge and consent to receive the Services by the Practitioner via telemedicine. I understand that the telemedicine visit will involve communicating with the Practitioner through live audiovisual communication technology and the disclosure of certain medical information by electronic transmission. I acknowledge that I have been given the opportunity to request an in-person assessment or other available alternative prior to the telemedicine visit and am voluntarily participating in the telemedicine visit.  I understand that I have the right to withhold or withdraw my consent to the use of telemedicine in the course of my care at any time, without affecting my right to future care or treatment, and that the Practitioner or I may terminate the telemedicine visit at any time. I understand that I have the right to inspect all information obtained and/or recorded in the course of the telemedicine visit and may receive copies of available information for a reasonable fee.  I understand that some of the potential risks of receiving the Services via telemedicine include:   Delay or interruption in medical evaluation due to technological equipment failure or disruption;  Information transmitted may not be sufficient (e.g. poor resolution of images) to allow for appropriate medical decision making by the  Practitioner; and/or   In rare instances, security protocols could fail, causing a breach of personal health information.  Furthermore, I acknowledge that it is my responsibility to provide information about my medical history, conditions and care that is complete and accurate to the best of my ability. I acknowledge that Practitioner's advice, recommendations, and/or decision may be based on factors not within their control, such as incomplete or inaccurate data provided by me or distortions of diagnostic images or specimens that may result from electronic transmissions. I understand that the practice of medicine is not an exact science and that Practitioner makes no warranties or guarantees regarding treatment outcomes. I acknowledge that a copy of this consent can be made available to me via my patient portal ([facility]), or I can request a printed copy by calling the office of [facility].    I understand that my insurance will be billed for this visit.   I have read or had this consent read to me.  I understand the contents of this consent, which adequately explains the benefits and risks of the Services being provided via telemedicine.   I have been provided ample opportunity to ask questions regarding this consent and the Services and have had my questions answered to my satisfaction.  I give my informed consent for the services to be provided through the use of telemedicine in my medical care

## 2020-09-15 NOTE — Telephone Encounter (Signed)
RN spoke to patient. Instruction were given  from today's virtual visit 09/15/20 .  AVS SUMMARY has been sent by [facility] and mailed labslip for Feb 2022 .  Follow up virtual  appointment schedule for 12/23/20 at 8:40 am Referral for EP ordered.  Patient verbalized understanding

## 2020-09-19 ENCOUNTER — Encounter: Payer: Self-pay | Admitting: Cardiology

## 2020-09-19 DIAGNOSIS — F411 Generalized anxiety disorder: Secondary | ICD-10-CM | POA: Diagnosis not present

## 2020-09-19 DIAGNOSIS — F331 Major depressive disorder, recurrent, moderate: Secondary | ICD-10-CM | POA: Diagnosis not present

## 2020-09-19 DIAGNOSIS — G894 Chronic pain syndrome: Secondary | ICD-10-CM | POA: Diagnosis not present

## 2020-09-19 NOTE — Assessment & Plan Note (Signed)
See prolonged discussion with nonsustained VT.  Plan: Refer to EP  Start nadolol, and reduce ARB dose  Check CMP with magnesium

## 2020-09-19 NOTE — Assessment & Plan Note (Signed)
Blood pressure looks much better today.  He was monitoring his pressures at home and they have all been relatively the same as what they have been reading. As such, with the addition of nadolol, we will have to reduce his ARB dose.   Decrease valsartan dose to 1/2 tablet  Start nadolol 40 mg

## 2020-09-19 NOTE — Assessment & Plan Note (Signed)
Outstanding symptomatic relief with PCI of the LAD.  He is on lifelong Eliquis for recurrent pulmonary embolism and stroke.  We therefore stopped Plavix and aspirin.  I have restarted beta-blocker-(nadolol 40 mg daily) heart rates are now faster, frequent PVCs and nonsustained V. Tach. Reducing dose of ARB, but continuing valsartan at 160 mg daily.. Continue current dose of simvastatin.

## 2020-09-19 NOTE — Assessment & Plan Note (Signed)
Very interesting that he has had frequent PVCs and nonsustained VT without any symptoms. Echocardiogram was very unremarkable, but the Myoview was a little unsettling.  I personally reviewed it and felt as though there was a fixed inferior defect, but there was no wall motion normality indicated by the reader which was corroborated by the echocardiogram showing no wall motion abnormality.  In this setting a fixed defect with wall motion would suggest artifact.  I had 2 additional readers review the images both the echo and the stress test and felt that there was no evidence of LVH, and that most likely this was indeed artifact in the Myoview.  They agreed with readers assessment as LOW RISK.  I explained this phenomenon to Emma in detail, going through both the Myoview and Echo as well as the monitor results.  At this point, I think we can hold off on further ischemic evaluation with cardiac catheterization and some he is totally asymptomatic with no regional wall motion abnormalities with an unusual amount of artifact on Myoview.  Plan:  With frequent PVCs and NSVT, will refer to Electrophysiology to consider the possibility of PVC ablation or any particular antiarrhythmic.  Check CMP with magnesium  We will start beta-blocker (he has a resting heart rate 106 bpm today), but will reduce valsartan dose   Start nadolol 40 mg daily (with potential to titrate further)  Reduce valsartan to 1/2 tablet

## 2020-09-19 NOTE — Assessment & Plan Note (Signed)
LDL is 38 as of October.  Doing well on 10 mg rosuvastatin.  No change.

## 2020-09-21 ENCOUNTER — Other Ambulatory Visit: Payer: Self-pay

## 2020-09-21 ENCOUNTER — Ambulatory Visit: Payer: Medicare Other | Admitting: Internal Medicine

## 2020-09-21 ENCOUNTER — Encounter: Payer: Self-pay | Admitting: Internal Medicine

## 2020-09-21 VITALS — BP 130/76 | HR 67 | Ht 71.5 in | Wt 243.0 lb

## 2020-09-21 DIAGNOSIS — I472 Ventricular tachycardia: Secondary | ICD-10-CM

## 2020-09-21 DIAGNOSIS — I493 Ventricular premature depolarization: Secondary | ICD-10-CM | POA: Diagnosis not present

## 2020-09-21 DIAGNOSIS — I4729 Other ventricular tachycardia: Secondary | ICD-10-CM

## 2020-09-21 MED ORDER — AMIODARONE HCL 200 MG PO TABS: ORAL_TABLET | ORAL | 3 refills | Status: DC

## 2020-09-21 MED ORDER — NADOLOL 40 MG PO TABS
40.0000 mg | ORAL_TABLET | Freq: Every day | ORAL | 0 refills | Status: DC
Start: 2020-09-21 — End: 2020-11-15

## 2020-09-21 NOTE — Progress Notes (Signed)
HPI [name] is referred today by Dr. Ellyn Hack for evaluation of PVC's. He is a pleasant 79 yo man with obesity, CAD, s/p PCI, HTN, and mild LV dysfunction. He was found to have 19% PVC's when he wore a cardiac monitor. He does not have palpitations but does note sob which has been present for several months. He also notes that he was told her had a skipped heart beat as a teenager. He denies chest pain. He does not have angina. He had essentially no chest pain before his heart cath. He denies syncope. Allergies  Allergen Reactions  . Garlic Anaphylaxis  . Onion Anaphylaxis  . Codeine Other (See Comments)    "PASSES OUT"  . Hydrocodone     Pt states that he is allergic to synthetic hydrocodone.  . Novocain [Procaine] Nausea Only     Current Outpatient Medications  Medication Sig Dispense Refill  . allopurinol (ZYLOPRIM) 300 MG tablet Take 300 mg by mouth daily.    [name] ALPRAZolam (XANAX) 0.5 MG tablet Take 1 tablet (0.5 mg total) by mouth 2 (two) times daily as needed for anxiety. 5 tablet 0  . apixaban (ELIQUIS) 5 MG TABS tablet Take 5 mg by mouth 2 (two) times daily.    [name] escitalopram (LEXAPRO) 20 MG tablet Take 10 mg by mouth daily.    [name] gabapentin (NEURONTIN) 300 MG capsule Take 300 mg by mouth 3 (three) times daily. 300 mg in am, 300 mg at noon and 600 at bedtime    . methocarbamol (ROBAXIN) 750 MG tablet Take 750 mg by mouth 3 (three) times daily.    . nadolol (CORGARD) 40 MG tablet Take 1 tablet (40 mg total) by mouth daily. 30 tablet 3  . nitroGLYCERIN (NITROSTAT) 0.4 MG SL tablet Place 1 tablet (0.4 mg total) under the tongue every 5 (five) minutes as needed for chest pain. 25 tablet 3  . ondansetron (ZOFRAN) 4 MG tablet Take 1 tablet (4 mg total) by mouth every 8 (eight) hours as needed for nausea or vomiting. 20 tablet 0  . rosuvastatin (CRESTOR) 20 MG tablet Take 10 mg by mouth daily.    . tadalafil (CIALIS) 20 MG tablet Take 20 mg by mouth daily as needed for erectile  dysfunction. Patient takes as needed    . testosterone cypionate (DEPOTESTOSTERONE CYPIONATE) 200 MG/ML injection Inject 200 mg into the muscle once a week. 1 ml weekly    . traMADol-acetaminophen (ULTRACET) 37.5-325 MG tablet Take 2 tablets by mouth in the morning, at noon, and at bedtime.     . valsartan (DIOVAN) 160 MG tablet Take 1 tablet (160 mg total) by mouth daily. 90 tablet 3   No current facility-administered medications for this visit.     Past Medical History:  Diagnosis Date  . Anxiety   . Arthritis    KNEES, NECK, SHOULDERS, HANDS  . Asthma   . Biceps tendon tear    right  . Cancer ([place])    skin cancer-  2 basal cell, 1 squamous cell  . Coronary artery disease   . GERD (gastroesophageal reflux disease)   . Gout, arthritis    BILATERAL GREAT TOE-- PER PT STABLE  . H/O transfusion of packed red blood cells    as a child/ s/p tonsillectomy  . History of pulmonary embolism    Maintained on lifelong anticoagulation with Eliquis.  . Lung nodule    BENIGN RIGHT UPPER--  CHEST CT 08-22-2011  . OSA (obstructive  sleep apnea)    NO CPAP -states dr said possibly related to asthma  . S/P dilatation of esophageal stricture   . Stroke ([place])   . Torn rotator cuff    Right x 3    ROS:   All systems reviewed and negative except as noted in the HPI.   Past Surgical History:  Procedure Laterality Date  . CARPAL TUNNEL RELEASE Right 2010  . CORONARY CALCIUM SCORING WITH CT CORONARY ANGIOGRAM  05/29/2018   Coronary calcium score 416.  Possible obstructive CAD in the mid circumflex-OM1-OM 2, D2 and a moderate disease in the proximal LAD -->  FFR --> LAD: Proximal.90, mid.86 distal.68 D2.80, Cx: Mid.76 OM2.74: Hemodynamically significant distal LAD, D2, mid circumflex and OM2 lesions  . CORONARY STENT INTERVENTION N/A 06/10/2018   Procedure: CORONARY STENT INTERVENTION;  Surgeon: Jettie Booze, MD;  Location: [facility];;  90% mLAD --> DES PCI (ORSIRO 2.75X18) => 0%   . JOINT REPLACEMENT    . LEFT HEART CATH AND CORONARY ANGIOGRAPHY N/A 06/10/2018   Procedure: LEFT HEART CATH AND CORONARY ANGIOGRAPHY;  Surgeon: Jettie Booze, MD;  Location: [facility];;  90% mLAD --> DES PCI.  pRCA 25%. p-m Cx 40%. Ost OM1 50%, ost OM2 40% with 50% mid.  pLAD 30% & ost D1 75 % (med Rx).  Normal LVEDP.   [name] NEPHRECTOMY    . SHOULDER ARTHROSCOPY Right   . TONSILLECTOMY  AGE 70  . TOTAL KNEE ARTHROPLASTY Left 10-15-2008  . TOTAL KNEE ARTHROPLASTY Right 01/18/2014   Procedure: RIGHT TOTAL KNEE ARTHROPLASTY;  Surgeon: Gearlean Alf, MD;  Location: [facility];  Service: Orthopedics;  Laterality: Right;  . TRANSTHORACIC ECHOCARDIOGRAM  01/2018    Normal LV size with moderate LVH.  EF 60 to 65% with GR 1 DD (normal for age)  . WRIST GANGLION EXCISION Right 1964     Family History  Problem Relation Age of Onset  . Deep vein thrombosis Father   . Stroke Father   . Stroke Brother   . Colon cancer Mother   . Migraines Neg Hx   . Pancreatic cancer Neg Hx   . Stomach cancer Neg Hx      Social History   Socioeconomic History  . Marital status: Married    Spouse name: Not on file  . Number of children: 4  . Years of education: some master's classes  . Highest education level: Bachelor's degree (e.g., BA, AB, BS)  Occupational History  . Occupation: CNA  Tobacco Use  . Smoking status: Former Smoker    Packs/day: 1.00    Years: 25.00    Pack years: 25.00    Types: Cigarettes    Quit date: 07/13/1980    Years since quitting: 40.2  . Smokeless tobacco: Never Used  Vaping Use  . Vaping Use: Never used  Substance and Sexual Activity  . Alcohol use: Yes    Comment: RARE  . Drug use: No  . Sexual activity: Yes    Partners: Female  Other Topics Concern  . Not on file  Social History Narrative   Lives at home with his wife & youngest daughter and her family   Right handed   Drinks 2 cups of coffee daily, occasional cup of tea   Social Determinants of  Health   Financial Resource Strain:   . Difficulty of Paying Living Expenses: Not on file  Food Insecurity:   . Worried About Charity fundraiser  in the Last Year: Not on file  . Ran Out of Food in the Last Year: Not on file  Transportation Needs:   . Lack of Transportation (Medical): Not on file  . Lack of Transportation (Non-Medical): Not on file  Physical Activity:   . Days of Exercise per Week: Not on file  . Minutes of Exercise per Session: Not on file  Stress:   . Feeling of Stress : Not on file  Social Connections:   . Frequency of Communication with Friends and Family: Not on file  . Frequency of Social Gatherings with Friends and Family: Not on file  . Attends Religious Services: Not on file  . Active Member of Clubs or Organizations: Not on file  . Attends Archivist Meetings: Not on file  . Marital Status: Not on file  Intimate Partner Violence:   . Fear of Current or Ex-Partner: Not on file  . Emotionally Abused: Not on file  . Physically Abused: Not on file  . Sexually Abused: Not on file     BP 130/76   Pulse 67   Ht 5' 11.5" (1.816 m)   Wt 243 lb (110.2 kg)   SpO2 96%   BMI 33.42 kg/m   Physical Exam:  obese appearing NAD HEENT: Unremarkable Neck:  No JVD, no thyromegally Lymphatics:  No adenopathy Back:  No CVA tenderness Lungs:  Clear with no wheezes HEART:  Regular rate rhythm, no murmurs, no rubs, no clicks Abd:  soft, positive bowel sounds, no organomegally, no rebound, no guarding Ext:  2 plus pulses, no edema, no cyanosis, no clubbing Skin:  No rashes no nodules Neuro:  CN II through XII intact, motor grossly intact  EKG - NSR with PVC's  Assess/Plan: 1. PVC's - it is unclear to me that he is symptomatic. However his PVC burden is quite high. He will undergo a trial of amiodarone to see if we can make him feel better with suppression of his PVC's.  2. CAD - he is s/p PCI several months ago. He did not have angina. I wonder if his  dyspnea could be related to his CAD 3. Obesity - he is encouraged to lose weight 4. Dyspnea - he has class 2B symptoms. He is encouraged to avoid salty foods.   Carleene Overlie Margaruite Top,MD

## 2020-09-21 NOTE — Patient Instructions (Addendum)
Medication Instructions:  Your physician has recommended you make the following change in your medication:   1.  START taking amiodarone 200 mg-    A.  TAKE one tablet by mouth twice a day for 30 days  B.  THEN decrease to one tablet by mouth ONCE a day after that  2.  IN 2 weeks (October 05, 2020) STOP taking NADOLOL  3.  EKG- October 05, 2020 at 8:00 am at the [facility]: None ordered.  Testing/Procedures: None ordered.  Follow-Up: Your physician wants you to follow-up in: 2 months with Dr. Lovena Le.  November 15, 2020 at 8:45 am at the [facility] office     Any Other Special Instructions Will Be Listed Below (If Applicable).  If you need a refill on your cardiac medications before your next appointment, please call your pharmacy.   Amiodarone tablets What is this medicine? AMIODARONE (a MEE oh da rone) is an antiarrhythmic drug. It helps make your heart beat regularly. Because of the side effects caused by this medicine, it is only used when other medicines have not worked. It is usually used for heartbeat problems that may be life threatening. This medicine may be used for other purposes; ask your health care provider or pharmacist if you have questions. COMMON BRAND NAME(S): Cordarone, Pacerone What should I tell my health care provider before I take this medicine? They need to know if you have any of these conditions:  liver disease  lung disease  other heart problems  thyroid disease  an unusual or allergic reaction to amiodarone, iodine, other medicines, foods, dyes, or preservatives  pregnant or trying to get pregnant  breast-feeding How should I use this medicine? Take this medicine by mouth with a glass of water. Follow the directions on the prescription label. You can take this medicine with or without food. However, you should always take it the same way each time. Take your doses at regular intervals. Do not take your medicine more often than  directed. Do not stop taking except on the advice of your doctor or health care professional. A special MedGuide will be given to you by the pharmacist with each prescription and refill. Be sure to read this information carefully each time. Talk to your pediatrician regarding the use of this medicine in children. Special care may be needed. Overdosage: If you think you have taken too much of this medicine contact a poison control center or emergency room at once. NOTE: This medicine is only for you. Do not share this medicine with others. What if I miss a dose? If you miss a dose, take it as soon as you can. If it is almost time for your next dose, take only that dose. Do not take double or extra doses. What may interact with this medicine? Do not take this medicine with any of the following medications:  abarelix  apomorphine  arsenic trioxide  certain antibiotics like erythromycin, gemifloxacin, levofloxacin, pentamidine  certain medicines for depression like amoxapine, tricyclic antidepressants  certain medicines for fungal infections like fluconazole, itraconazole, ketoconazole, posaconazole, voriconazole  certain medicines for irregular heart beat like disopyramide, dronedarone, ibutilide, propafenone, sotalol  certain medicines for malaria like chloroquine, halofantrine  cisapride  droperidol  haloperidol  haw[place]orn  maprotiline  methadone  phenothiazines like chlorpromazine, mesoridazine, thioridazine  pimozide  ranolazine  red yeast rice  vardenafil This medicine may also interact with the following medications:  antiviral medicines for HIV or AIDS  certain  medicines for blood pressure, heart disease, irregular heart beat  certain medicines for cholesterol like atorvastatin, cerivastatin, lovastatin, simvastatin  certain medicines for hepatitis C like sofosbuvir and ledipasvir; sofosbuvir  certain medicines for seizures like phenytoin  certain  medicines for thyroid problems  certain medicines that treat or prevent blood clots like warfarin  cholestyramine  cimetidine  clopidogrel  cyclosporine  dextromethorphan  diuretics  dofetilide  fentanyl  general anesthetics  grapefruit juice  lidocaine  loratadine  methotrexate  other medicines that prolong the QT interval (cause an abnormal heart rhythm)  procainamide  quinidine  rifabutin, rifampin, or rifapentine  St. John's Wort  trazodone  ziprasidone This list may not describe all possible interactions. Give your health care provider a list of all the medicines, herbs, non-prescription drugs, or dietary supplements you use. Also tell them if you smoke, drink alcohol, or use illegal drugs. Some items may interact with your medicine. What should I watch for while using this medicine? Your condition will be monitored closely when you first begin therapy. Often, this drug is first started in a hospital or other monitored health care setting. Once you are on maintenance therapy, visit your doctor or health care professional for regular checks on your progress. Because your condition and use of this medicine carry some risk, it is a good idea to carry an identification card, necklace or bracelet with details of your condition, medications, and doctor or health care professional. Dennis Bast may get drowsy or dizzy. Do not drive, use machinery, or do anything that needs mental alertness until you know how this medicine affects you. Do not stand or sit up quickly, especially if you are an older patient. This reduces the risk of dizzy or fainting spells. This medicine can make you more sensitive to the sun. Keep out of the sun. If you cannot avoid being in the sun, wear protective clothing and use sunscreen. Do not use sun lamps or tanning beds/booths. You should have regular eye exams before and during treatment. Call your doctor if you have blurred vision, see halos, or your  eyes become sensitive to light. Your eyes may get dry. It may be helpful to use a lubricating eye solution or artificial tears solution. If you are going to have surgery or a procedure that requires contrast dyes, tell your doctor or health care professional that you are taking this medicine. What side effects may I notice from receiving this medicine? Side effects that you should report to your doctor or health care professional as soon as possible:  allergic reactions like skin rash, itching or hives, swelling of the face, lips, or tongue  blue-gray coloring of the skin  blurred vision, seeing blue green halos, increased sensitivity of the eyes to light  breathing problems  chest pain  dark urine  fast, irregular heartbeat  feeling faint or light-headed  intolerance to heat or cold  nausea or vomiting  pain and swelling of the scrotum  pain, tingling, numbness in feet, hands  redness, blistering, peeling or loosening of the skin, including inside the mouth  spitting up blood  stomach pain  sweating  unusual or uncontrolled movements of body  unusually weak or tired  weight gain or loss  yellowing of the eyes or skin Side effects that usually do not require medical attention (report to your doctor or health care professional if they continue or are bothersome):  change in sex drive or performance  constipation  dizziness  headache  loss of appetite  trouble sleeping This list may not describe all possible side effects. Call your doctor for medical advice about side effects. You may report side effects to FDA at 1-800-FDA-1088. Where should I keep my medicine? Keep out of the reach of children. Store at room temperature between 20 and 25 degrees C (68 and 77 degrees F). Protect from light. Keep container tightly closed. Throw away any unused medicine after the expiration date. NOTE: This sheet is a summary. It may not cover all possible information. If you  have questions about this medicine, talk to your doctor, pharmacist, or health care provider.  2020 Elsevier/Gold Standard (2018-09-24 13:44:04)

## 2020-10-05 ENCOUNTER — Other Ambulatory Visit: Payer: Self-pay

## 2020-10-05 ENCOUNTER — Ambulatory Visit (INDEPENDENT_AMBULATORY_CARE_PROVIDER_SITE_OTHER): Payer: Medicare Other

## 2020-10-05 VITALS — Ht 71.5 in | Wt 254.0 lb

## 2020-10-05 DIAGNOSIS — I493 Ventricular premature depolarization: Secondary | ICD-10-CM | POA: Diagnosis not present

## 2020-10-05 NOTE — Progress Notes (Signed)
1.) Reason for visit: PVC's  2.) Name of MD requesting visit: Dr. Lovena Le  3.) H&P: Pt with frequent PVC's.  Started on amiodarone 2 weeks ago.  4.) ROS related to problem: Pt states he has had some fatigue on the amiodarone 400 mg daily.  States has been a little better the last few days.  5.) Assessment and plan per MD: EKG reviewed by Dr. Lovena Le.  Continue current plan and follow up as scheduled.  Pt will discontinue beta blocker today.  He will contact this nurse if fatigue continues or worsens over the next few weeks.

## 2020-10-05 NOTE — Patient Instructions (Addendum)
Medication Instructions:  Your physician recommends that you continue on your current medications as directed. Please refer to the Current Medication list given to you today.  Labwork: None ordered.  Testing/Procedures: None ordered.  Follow-Up: Your physician wants you to follow-up in:   November 16, 2019 at 8:45 am at the [facility] office   Any Other Special Instructions Will Be Listed Below (If Applicable).  If you need a refill on your cardiac medications before your next appointment, please call your pharmacy.

## 2020-10-13 DIAGNOSIS — R3915 Urgency of urination: Secondary | ICD-10-CM | POA: Diagnosis not present

## 2020-10-13 NOTE — Telephone Encounter (Signed)
In response to concerned about medication changes. Plan will be to hold nadolol for now and reassess.  Glenetta Hew, MD

## 2020-10-18 DIAGNOSIS — M4722 Other spondylosis with radiculopathy, cervical region: Secondary | ICD-10-CM | POA: Diagnosis not present

## 2020-10-18 DIAGNOSIS — M4802 Spinal stenosis, cervical region: Secondary | ICD-10-CM | POA: Diagnosis not present

## 2020-10-18 DIAGNOSIS — M4726 Other spondylosis with radiculopathy, lumbar region: Secondary | ICD-10-CM | POA: Diagnosis not present

## 2020-10-18 DIAGNOSIS — M25511 Pain in right shoulder: Secondary | ICD-10-CM | POA: Diagnosis not present

## 2020-10-21 DIAGNOSIS — N5201 Erectile dysfunction due to arterial insufficiency: Secondary | ICD-10-CM | POA: Diagnosis not present

## 2020-11-09 DIAGNOSIS — G894 Chronic pain syndrome: Secondary | ICD-10-CM | POA: Diagnosis not present

## 2020-11-09 DIAGNOSIS — G4701 Insomnia due to medical condition: Secondary | ICD-10-CM | POA: Diagnosis not present

## 2020-11-09 DIAGNOSIS — F332 Major depressive disorder, recurrent severe without psychotic features: Secondary | ICD-10-CM | POA: Diagnosis not present

## 2020-11-09 DIAGNOSIS — F411 Generalized anxiety disorder: Secondary | ICD-10-CM | POA: Diagnosis not present

## 2020-11-15 ENCOUNTER — Ambulatory Visit: Payer: Medicare Other | Admitting: Internal Medicine

## 2020-11-15 ENCOUNTER — Encounter: Payer: Self-pay | Admitting: Internal Medicine

## 2020-11-15 ENCOUNTER — Other Ambulatory Visit: Payer: Self-pay

## 2020-11-15 VITALS — BP 134/78 | HR 103 | Ht 71.5 in | Wt 236.4 lb

## 2020-11-15 DIAGNOSIS — I493 Ventricular premature depolarization: Secondary | ICD-10-CM

## 2020-11-15 DIAGNOSIS — I2510 Atherosclerotic heart disease of native coronary artery without angina pectoris: Secondary | ICD-10-CM | POA: Diagnosis not present

## 2020-11-15 DIAGNOSIS — Z9861 Coronary angioplasty status: Secondary | ICD-10-CM

## 2020-11-15 MED ORDER — AMIODARONE HCL 200 MG PO TABS: 200.0000 mg | ORAL_TABLET | Freq: Every day | ORAL | 3 refills | Status: DC

## 2020-11-15 NOTE — Progress Notes (Signed)
HPI [name] returns today for ongoing evaluation of PVC's. He is a pleasant 80 yo man with obesity, CAD, s/p PCI, HTN, and mild LV dysfunction. He was found to have 19% PVC's when he wore a cardiac monitor. He does not have palpitations but does note sob which has been present for several months. He also notes that he was told her had a skipped heart beat as a teenager. He denies chest pain. He does not have angina. He had essentially no chest pain before his heart cath. He denies syncope. He has been placed on amiodarone. My hope was that suppression of his PVC's would lead to him feeling better. In the interim, he notes that he feels better after stopping the nadolol.  Allergies  Allergen Reactions  . Garlic Anaphylaxis  . Onion Anaphylaxis  . Codeine Other (See Comments)    "PASSES OUT"  . Hydrocodone     Pt states that he is allergic to synthetic hydrocodone.  . Novocain [Procaine] Nausea Only     Current Outpatient Medications  Medication Sig Dispense Refill  . allopurinol (ZYLOPRIM) 300 MG tablet Take 300 mg by mouth daily.    [name] ALPRAZolam (XANAX) 0.5 MG tablet Take 1 tablet (0.5 mg total) by mouth 2 (two) times daily as needed for anxiety. 5 tablet 0  . amiodarone (PACERONE) 200 MG tablet Take 1 tablet (200 mg total) by mouth 2 (two) times daily for 30 days, THEN 1 tablet (200 mg total) daily. 120 tablet 3  . apixaban (ELIQUIS) 5 MG TABS tablet Take 5 mg by mouth 2 (two) times daily.    [name] escitalopram (LEXAPRO) 20 MG tablet Take 10 mg by mouth daily.    [name] gabapentin (NEURONTIN) 300 MG capsule Take 300 mg by mouth 3 (three) times daily. 300 mg in am, 300 mg at noon and 600 at bedtime    . methocarbamol (ROBAXIN) 750 MG tablet Take 750 mg by mouth 3 (three) times daily.    . nadolol (CORGARD) 40 MG tablet Take 1 tablet (40 mg total) by mouth daily for 14 days. 14 tablet 0  . nitroGLYCERIN (NITROSTAT) 0.4 MG SL tablet Place 1 tablet (0.4 mg total) under the tongue every 5  (five) minutes as needed for chest pain. 25 tablet 3  . ondansetron (ZOFRAN) 4 MG tablet Take 1 tablet (4 mg total) by mouth every 8 (eight) hours as needed for nausea or vomiting. 20 tablet 0  . rosuvastatin (CRESTOR) 20 MG tablet Take 10 mg by mouth daily.    . tadalafil (CIALIS) 20 MG tablet Take 20 mg by mouth daily as needed for erectile dysfunction. Patient takes as needed    . testosterone cypionate (DEPOTESTOSTERONE CYPIONATE) 200 MG/ML injection Inject 200 mg into the muscle once a week. 1 ml weekly    . traMADol-acetaminophen (ULTRACET) 37.5-325 MG tablet Take 2 tablets by mouth in the morning, at noon, and at bedtime.     . valsartan (DIOVAN) 160 MG tablet Take 1 tablet (160 mg total) by mouth daily. 90 tablet 3   No current facility-administered medications for this visit.     Past Medical History:  Diagnosis Date  . Anxiety   . Arthritis    KNEES, NECK, SHOULDERS, HANDS  . Asthma   . Biceps tendon tear    right  . Cancer ([place])    skin cancer-  2 basal cell, 1 squamous cell  . Coronary artery disease   . GERD (gastroesophageal  reflux disease)   . Gout, arthritis    BILATERAL GREAT TOE-- PER PT STABLE  . H/O transfusion of packed red blood cells    as a child/ s/p tonsillectomy  . History of pulmonary embolism    Maintained on lifelong anticoagulation with Eliquis.  . Lung nodule    BENIGN RIGHT UPPER--  CHEST CT 08-22-2011  . OSA (obstructive sleep apnea)    NO CPAP -states dr said possibly related to asthma  . S/P dilatation of esophageal stricture   . Stroke ([place])   . Torn rotator cuff    Right x 3    ROS:   All systems reviewed and negative except as noted in the HPI.   Past Surgical History:  Procedure Laterality Date  . CARPAL TUNNEL RELEASE Right 2010  . CORONARY CALCIUM SCORING WITH CT CORONARY ANGIOGRAM  05/29/2018   Coronary calcium score 416.  Possible obstructive CAD in the mid circumflex-OM1-OM 2, D2 and a moderate disease in the proximal LAD  -->  FFR --> LAD: Proximal.90, mid.86 distal.68 D2.80, Cx: Mid.76 OM2.74: Hemodynamically significant distal LAD, D2, mid circumflex and OM2 lesions  . CORONARY STENT INTERVENTION N/A 06/10/2018   Procedure: CORONARY STENT INTERVENTION;  Surgeon: Jettie Booze, MD;  Location: [facility];;  90% mLAD --> DES PCI (ORSIRO 2.75X18) => 0%  . JOINT REPLACEMENT    . LEFT HEART CATH AND CORONARY ANGIOGRAPHY N/A 06/10/2018   Procedure: LEFT HEART CATH AND CORONARY ANGIOGRAPHY;  Surgeon: Jettie Booze, MD;  Location: [facility];;  90% mLAD --> DES PCI.  pRCA 25%. p-m Cx 40%. Ost OM1 50%, ost OM2 40% with 50% mid.  pLAD 30% & ost D1 75 % (med Rx).  Normal LVEDP.   [name] NEPHRECTOMY    . SHOULDER ARTHROSCOPY Right   . TONSILLECTOMY  AGE 56  . TOTAL KNEE ARTHROPLASTY Left 10-15-2008  . TOTAL KNEE ARTHROPLASTY Right 01/18/2014   Procedure: RIGHT TOTAL KNEE ARTHROPLASTY;  Surgeon: Gearlean Alf, MD;  Location: [facility];  Service: Orthopedics;  Laterality: Right;  . TRANSTHORACIC ECHOCARDIOGRAM  01/2018    Normal LV size with moderate LVH.  EF 60 to 65% with GR 1 DD (normal for age)  . WRIST GANGLION EXCISION Right 1964     Family History  Problem Relation Age of Onset  . Deep vein thrombosis Father   . Stroke Father   . Stroke Brother   . Colon cancer Mother   . Migraines Neg Hx   . Pancreatic cancer Neg Hx   . Stomach cancer Neg Hx      Social History   Socioeconomic History  . Marital status: Married    Spouse name: Not on file  . Number of children: 4  . Years of education: some master's classes  . Highest education level: Bachelor's degree (e.g., BA, AB, BS)  Occupational History  . Occupation: CNA  Tobacco Use  . Smoking status: Former Smoker    Packs/day: 1.00    Years: 25.00    Pack years: 25.00    Types: Cigarettes    Quit date: 07/13/1980    Years since quitting: 40.3  . Smokeless tobacco: Never Used  Vaping Use  . Vaping Use: Never used  Substance and  Sexual Activity  . Alcohol use: Yes    Comment: RARE  . Drug use: No  . Sexual activity: Yes    Partners: Female  Other Topics Concern  . Not on file  Social  History Narrative   Lives at home with his wife & youngest daughter and her family   Right handed   Drinks 2 cups of coffee daily, occasional cup of tea   Social Determinants of Health   Financial Resource Strain: Not on file  Food Insecurity: Not on file  Transportation Needs: Not on file  Physical Activity: Not on file  Stress: Not on file  Social Connections: Not on file  Intimate Partner Violence: Not on file     There were no vitals taken for this visit.  Physical Exam:  Well appearing NAD HEENT: Unremarkable Neck:  No JVD, no thyromegally Lymphatics:  No adenopathy Back:  No CVA tenderness Lungs:  Clear HEART:  Regular rate rhythm, no murmurs, no rubs, no clicks Abd:  soft, positive bowel sounds, no organomegally, no rebound, no guarding Ext:  2 plus pulses, no edema, no cyanosis, no clubbing Skin:  No rashes no nodules Neuro:  CN II through XII intact, motor grossly intact  EKG  DEVICE  Normal device function.  See PaceArt for details.   Assess/Plan: 1. PVC's - the degree of PVC suppression is unclear but probably better. He will continue low dose amiodarone for now. 2. CAD - he is s/p PCI several months ago. He did not have angina. I encouraged him to increase his physical activity. 3. Obesity - he is encouraged to lose weight but has not done so. 4. Dyspnea - he has class 2B symptoms. He is encouraged to avoid salty foods and to lose weight.   Joseph Overlie Waleed Dettman,MD

## 2020-11-15 NOTE — Patient Instructions (Addendum)
Medication Instructions:  °Your physician recommends that you continue on your current medications as directed. Please refer to the Current Medication list given to you today. ° °Labwork: °None ordered. ° °Testing/Procedures: °None ordered. ° °Follow-Up: °Your physician wants you to follow-up in: 6 months with Gregg Taylor, MD or one of the following Advanced Practice Providers on your designated Care Team:   °· Amber Seiler, NP °· Renee Ursuy, PA-C °· Michael "Andy" Tillery, PA-C ° ° °Any Other Special Instructions Will Be Listed Below (If Applicable). ° °If you need a refill on your cardiac medications before your next appointment, please call your pharmacy.  ° ° ° ° ° °

## 2020-11-23 ENCOUNTER — Telehealth: Payer: Self-pay | Admitting: Neurology

## 2020-11-23 NOTE — Telephone Encounter (Signed)
[name], patient may have a history of untreated sleep apnea. In 2019 my instructions were to treat his OSA due to sequelae including stroke risk, cognitive impairment, fatigue and many others . Untreated sleep apnea can cause fatigue and cognitive deficits and addressing this first would be recommended as discussed in last office visit.  If he has addressed his untreated sleep apnea and is compliant with cpap (if the sleep study indicated he needed one) that is great but if not he has to discuss with Dr. Dema Severin. Dr. Dema Severin needs to evaluate him first and decide if he should be seen in neurology at this time or have a sleep study or go elsewhere for his chief complaint.  If he has not seen Dr. Dema Severin about this, he needs to see her first and have her evaluate him. He can discuss getting a sleep study referral with Dr. Dema Severin if clinically appropriate and also discuss his chief complaint and she needs to evaluate him first. He needs to see her not just call for a referral. thanks

## 2020-11-25 ENCOUNTER — Other Ambulatory Visit: Payer: Self-pay | Admitting: Family Medicine

## 2020-11-25 DIAGNOSIS — F331 Major depressive disorder, recurrent, moderate: Secondary | ICD-10-CM | POA: Diagnosis not present

## 2020-11-25 DIAGNOSIS — R413 Other amnesia: Secondary | ICD-10-CM | POA: Diagnosis not present

## 2020-11-25 DIAGNOSIS — F419 Anxiety disorder, unspecified: Secondary | ICD-10-CM | POA: Diagnosis not present

## 2020-11-25 DIAGNOSIS — R55 Syncope and collapse: Secondary | ICD-10-CM | POA: Diagnosis not present

## 2020-11-29 DIAGNOSIS — M25812 Other specified joint disorders, left shoulder: Secondary | ICD-10-CM | POA: Diagnosis not present

## 2020-11-29 DIAGNOSIS — M25811 Other specified joint disorders, right shoulder: Secondary | ICD-10-CM | POA: Diagnosis not present

## 2020-11-30 DIAGNOSIS — F411 Generalized anxiety disorder: Secondary | ICD-10-CM | POA: Diagnosis not present

## 2020-11-30 DIAGNOSIS — G894 Chronic pain syndrome: Secondary | ICD-10-CM | POA: Diagnosis not present

## 2020-11-30 DIAGNOSIS — G4701 Insomnia due to medical condition: Secondary | ICD-10-CM | POA: Diagnosis not present

## 2020-11-30 DIAGNOSIS — F331 Major depressive disorder, recurrent, moderate: Secondary | ICD-10-CM | POA: Diagnosis not present

## 2020-12-13 DIAGNOSIS — H903 Sensorineural hearing loss, bilateral: Secondary | ICD-10-CM | POA: Diagnosis not present

## 2020-12-14 ENCOUNTER — Other Ambulatory Visit: Payer: Self-pay

## 2020-12-14 ENCOUNTER — Ambulatory Visit
Admission: RE | Admit: 2020-12-14 | Discharge: 2020-12-14 | Disposition: A | Discharge status: Home / Self Care | Payer: Medicare Other | Source: Ambulatory Visit | Attending: Family Medicine | Admitting: Family Medicine

## 2020-12-14 DIAGNOSIS — R413 Other amnesia: Secondary | ICD-10-CM | POA: Diagnosis not present

## 2020-12-14 DIAGNOSIS — G319 Degenerative disease of nervous system, unspecified: Secondary | ICD-10-CM | POA: Diagnosis not present

## 2020-12-14 DIAGNOSIS — R2689 Other abnormalities of gait and mobility: Secondary | ICD-10-CM | POA: Diagnosis not present

## 2020-12-18 NOTE — Progress Notes (Signed)
[facility]    Provider:  Dr Jaynee Eagles Referring Provider: Harlan Stains, MD Primary Care Physician:  Harlan Stains, MD  CC: Short-term memory loss  Interval history December 18, 2020: We've seen patient in the past for chronic pain, EMG nerve conduction study confirmed radiculopathy, history of spinal stenosis myelomalacia, multidegenerative arthritic changes in the cervical spine, patient has seen multiple physicians in the past including [facility], orthopedics, [place] [facility] and it was decided not to get cervical surgery due to risks, all this is likely impairing his gait, he is also tried multiple medications and per patient pain management at [place] [facility] "gave up on him".  But today he is here for a different reason, memory loss.  Today we'll will not address these chronic issues, will focus on memory loss.  He also has untreated sleep apnea.  Patient has noted short-term memory problems, mainly name recall, for 2 years, he is have 7 major concussions where he lost consciousness every time, he has fractured his skull twice, he has had an MRI last week, he also reports he is a heel toe walker and sometimes has trouble walking up stairs, he has fallen in the past year but did not get injured, he was on medication that made him lightheaded, MMSE today 29 out of 30 animals 20. He has always had a problem with names, gotten so much worse,  Drive, never been lost,  Bo accidents in the car or home, family is noticing memory and mood issues moodier,  He has been to a Social worker and he feel well controlled on medication, he has untreated sleep apnea, hx of chronic pain, [facility] pharmacy, hx of concussions. Wife is having memory problems and will say that she told him things when she did not, he knows what date it is, he still manages his own finances, no missing bills. He has set up autopay. Father was 17 and declined after a stroke but not prior. Mom passed away at 60 no  memory problems, younger brothers no memory issues, younger sister is fine, father worked until he was 97 as a Games developer.   MRI brain 12/14/2020: Personally reviewed and agree with the following: No acute abnormality, Mild atrophy and mild white matter changes, typical for age.  Interval history 12/03/2017: Last emg/ncs confirmed radiculopathy, spinal stenosis with myelomalacia,  Multilevel degenerative  arthritic changes in the cervical spine. Last seen 12/2015. He had multiple opinions, saw [facility], saw orthopaedics, saw [place] [facility] and decided not to get cervical surgery due to risks. He continues to be in pain.  He has since had multiple rotator tears.  He tried Gabapentin, Lyrica and has tried opioids.  He went to pain management at [facility] and they "gave up on me".  He is here for chronic pain management, I think he would be better served with his pcp or another pain management center. He has been to physical therapy. He has pain that radiates from the right neck down the arm, sharp, shooting. Tried Topamax in the past. Don;t have much to offer here in Neurology for this patient, can try Nortriptyline or Amitriptyline but recommend pain center. He has untreated sleep apnea, discussed this can cause increased risk of stroke. He has daily headache. Highly encouraged seeing a sleep doctor.   Patient complains of symptoms per HPI as well as the following symptoms: short-term memory loss, joint pain, back and neck pain. Pertinent negatives and positives per HPI. All others negative   EHU:DJSHF P Alonzo  is a 81 y.o. male here as a referral from Dr. Dema Severin and Dr. Melina Schools for neck pain. He is a 80 year old male with a PMHx DVT and PE on chronic anticoagulation (coumadin), CKD, lacunar strokes in 2015 on MRI brain etiology small-vessel vs cardioembolic and retinal occlusion, headaches, hyperlipidemia, depression, neck trauma 1970 at c3-c4. Pain is very bad, the right arm is severely  painful. Started 5-7 years ago and is progressive. He gets pain that runs all the way from the shoulder to the middle 2 fingers. He has not had emg/ncs. He is in chronic pain. Also reports neck stiffness, impaired range of motion, numbness and tingling of the right arm and upper extremity weakness. Radiating pain is sharp. Symptoms are worsening. It waxes and wanes. He is 3-4 pain at the best and 10/10 at the worsr. At the most he has problems moving the arm due to pain, can be 10/10 pain. Nothing helps. Sleeping on it and use makes it worse. No other associated symptoms. He is having no symptoms in the other limbs. He also endorses weakness in the arm. The pain is continuous, The weakness is progressive. He has tingling and numbness in digit 3-4 in the right hand.   MRI cervical spine 12/20/2015: personally reviewed images and agree with the following  Abnormal MRI scan of the cervical spine showing severe degenerative changes throughout most noticeable at C3-4 where there is severe bilateral foraminal stenosis and canal stenosis with mild cord atrophy. There is also prominent asymmetric foraminal narrowing at C4-5 and C6-7 resulting in right C5 and possibly right C7 nerve root impingement.  Reviewed notes, labs and imaging from outside physicians, which showed:   Review of records from Dr. Rolena Infante, cervical spine surgery was discussed with ACDF and C3-C7 fusion which is an extensive operation.   MRI of the cervical spine images were very low in quality. Impression of MRI of the cervical spine in January showed canal stenosis at C3-C4 with cord atrophy, severe bilateral foraminal stenosis. At C4-C5 showed borderline canal stenosis, grade 1 spondylolisthesis of C4 and C5, posterior endplate spurs asymmetrically affecting the right C5 root, severe right foraminal stenosis. At C5-C6 mild canal stenosis with cord contact, moderate disc bulging, grade 1 spondylolisthesis of C5 on C6 and moderate to  severe left foraminal stenosis. At C6-C7 and mild canal stenosis and cord deformity with asymmetric mass effect on right C7 root. C7-T1 small right posterior lateral protrusion. C2-C3 mild canal stenosis at cord contact. Facet ankylosis is appreciated at multiple levels. Personally reviewed images and agree with findings.  CT of the head 11/25/2015 (personally reviewed images and agree with the following):  FINDINGS: There is chronic diffuse atrophy. There is no midline shift, hydrocephalus or mass. No acute hemorrhage or acute transcortical infarct is identified. The bony calvarium is intact. The visualized sinuses are clear. There is frontal scalp swelling/ hematoma.  IMPRESSION: No focal acute intracranial abnormality identified.  Chronic diffuse atrophy.  Patient was seen by [facility] in August 2015. Per Dr. 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 He had an MRI of the brain without contrast performed on 06/30/14, which revealed three subcortical acute infarcts in the right parietal lobe. MRA of the head was normal. He underwent a stroke workup. Carotid duplex showed bilateral atherosclerotic plaque but no hemodynamically significant stenosis in the internal carotids. Sed rate was 11, INR was 3.1,  CRP was 2.5. A recent 2D Echocardiogram was performed on 06/14/14, which revealed a normal LVEF of 55-60% with no wall motion abnormalities or cardiac source of emboli. He is compliant with his Coumadin. Of note, recent CT scan of the chest performed in June 2015 was negative for recurrent PE. He has been evaluated by a hematologist in the past, with hypercoagulable panel not too revealing for a specific thrombophilia. The headache is now dull and he takes Tylenol for it.  He has history of DVT and PE on Coumadin with supratherapeutic INR. He has a history of skull fracture as a  child, in which he suffered from chronic headaches up until his early 48s. He had a neck injury at age 89, in which he had a myelopathy and has residual hyperreflexia and numbness in the right leg. He has arthritis with bilateral knee surgeries.    Social History   Socioeconomic History  . Marital status: Married    Spouse name: Not on file  . Number of children: 4  . Years of education: some master's classes  . Highest education level: Bachelor's degree (e.g., BA, AB, BS)  Occupational History  . Occupation: CNA  Tobacco Use  . Smoking status: Former Smoker    Packs/day: 1.00    Years: 25.00    Pack years: 25.00    Types: Cigarettes    Quit date: 07/13/1980    Years since quitting: 40.4  . Smokeless tobacco: Never Used  Vaping Use  . Vaping Use: Never used  Substance and Sexual Activity  . Alcohol use: Yes    Comment: RARE  . Drug use: No  . Sexual activity: Yes    Partners: Female  Other Topics Concern  . Not on file  Social History Narrative   Lives at home with his wife & youngest daughter and her family   Right handed   Drinks 2 cups of coffee daily, occasional cup of tea   Social Determinants of Health   Financial Resource Strain: Not on file  Food Insecurity: Not on file  Transportation Needs: Not on file  Physical Activity: Not on file  Stress: Not on file  Social Connections: Not on file  Intimate Partner Violence: Not on file    Family History  Problem Relation Age of Onset  . Deep vein thrombosis Father   . Stroke Father   . Stroke Brother   . Colon cancer Mother   . Migraines Neg Hx   . Pancreatic cancer Neg Hx   . Stomach cancer Neg Hx     Past Medical History:  Diagnosis Date  . Abnormal heart rhythm    frequent PVCs, Dr Ellyn Hack  . Allergies    previously was seen at [facility]  . Anxiety    Wanda Plump  . Aortic atherosclerosis ([place])    on CT of chest 11/18  . Arthritis    KNEES, NECK, SHOULDERS, HANDS  . Asthma   . Biceps  tendon tear    right  . Biceps tendon tear    bilateral arms   . Cancer ([place])    skin cancer-  2 basal cell, 1 squamous cell  . Cervical spinal stenosis    with foramenal stenosis  . Chronic pain    Dr Clarnce Flock to tolerate opioids, antiseizure meds (keppra, topamax, lyrica) SNRI, tramadol not effective, Dr Nelva Bush  . Chronic shoulder pain   . Congenital absence of one kidney    has the right kidney  . Coronary  artery disease   . Depression    Wanda Plump  . DVT (deep venous thrombosis) ([facility])    hx of w/ pulmonary embolus (4) on chronic anticoagulation  . Dysthymia    Wanda Plump  . Esophageal reflux    "w/wtricture s/p dilation, Dr Oletta Lamas" per notes from [facility]  . Gallstones 04/2014   asymptomatic on CT  . GERD (gastroesophageal reflux disease)   . Gout, arthritis    BILATERAL GREAT TOE-- PER PT STABLE  . Gynecomastia    8/17 mammogram benign  . H/O transfusion of packed red blood cells    as a child/ s/p tonsillectomy  . Headache    after CVA, improved on Topamax  . Hearing loss    hearing aids 2017, [facility]  . History of pulmonary embolism    Maintained on lifelong anticoagulation with Eliquis.  . Hypogonadism in male    Dr Gilford Rile  . Lung nodule    BENIGN RIGHT UPPER--  CHEST CT 08-22-2011  . Moderate obstructive sleep apnea    (PSG 09/26/08 AHI 5/hr REM 25/hr, RDI 28/hr REM 37/hr, 02 nadir 88%; CPAP titration recommended; HSAT 02/03/18 ESS 9, AHI 21/hr, O2 min 83%), Dr Maxwell Caul, has not worn since 10/19, sleeps better without it (per [facility] notes).  . Obesity   . OSA (obstructive sleep apnea)    NO CPAP -states dr said possibly related to asthma  . Paresthesias    right foot  . S/P dilatation of esophageal stricture   . Stroke [facility])    3 small lacunar infarcts, parietal lobe, 8/15--Dr Tomi Likens, R retinal artery occlusion 8/15, lost almost 50% of vision R eye  . Torn rotator cuff    Right x 3  . Tubular  adenoma 01/2009   polyps, Dr Oletta Lamas    Past Surgical History:  Procedure Laterality Date  . CARPAL TUNNEL RELEASE Right 2010  . COLON SURGERY     x3  . CORONARY CALCIUM SCORING WITH CT CORONARY ANGIOGRAM  05/29/2018   Coronary calcium score 416.  Possible obstructive CAD in the mid circumflex-OM1-OM 2, D2 and a moderate disease in the proximal LAD -->  FFR --> LAD: Proximal.90, mid.86 distal.68 D2.80, Cx: Mid.76 OM2.74: Hemodynamically significant distal LAD, D2, mid circumflex and OM2 lesions  . CORONARY STENT INTERVENTION N/A 06/10/2018   Procedure: CORONARY STENT INTERVENTION;  Surgeon: Jettie Booze, MD;  Location: [place] [facility] INVASIVE CV LAB;;  90% mLAD --> DES PCI (ORSIRO 2.75X18) => 0%  . JOINT REPLACEMENT    . LEFT HEART CATH AND CORONARY ANGIOGRAPHY N/A 06/10/2018   Procedure: LEFT HEART CATH AND CORONARY ANGIOGRAPHY;  Surgeon: Jettie Booze, MD;  Location: [facility] INVASIVE CV LAB;;  90% mLAD --> DES PCI.  pRCA 25%. p-m Cx 40%. Ost OM1 50%, ost OM2 40% with 50% mid.  pLAD 30% & ost D1 75 % (med Rx).  Normal LVEDP.   [name] NEPHRECTOMY    . SHOULDER ARTHROSCOPY Right   . SHOULDER ARTHROSCOPY Left 2021  . TONSILLECTOMY  AGE 41  . TOTAL KNEE ARTHROPLASTY Left 10-15-2008  . TOTAL KNEE ARTHROPLASTY Right 01/18/2014   Procedure: RIGHT TOTAL KNEE ARTHROPLASTY;  Surgeon: Gearlean Alf, MD;  Location: [facility] ORS;  Service: Orthopedics;  Laterality: Right;  . TRANSTHORACIC ECHOCARDIOGRAM  01/2018    Normal LV size with moderate LVH.  EF 60 to 65% with GR 1 DD (normal for age)  . WRIST GANGLION EXCISION Right 1964  Current Outpatient Medications  Medication Sig Dispense Refill  . allopurinol (ZYLOPRIM) 300 MG tablet Take 300 mg by mouth daily.    [name] amiodarone (PACERONE) 200 MG tablet Take 1 tablet (200 mg total) by mouth daily. 90 tablet 3  . apixaban (ELIQUIS) 5 MG TABS tablet Take 5 mg by mouth 2 (two) times daily.    . clonazePAM (KLONOPIN) 0.5 MG tablet Take 0.5 mg by mouth daily as needed.     . methocarbamol (ROBAXIN) 750 MG tablet Take 750 mg by mouth 3 (three) times daily.    . mirtazapine (REMERON) 7.5 MG tablet Take 7.5 mg by mouth at bedtime.    . nitroGLYCERIN (NITROSTAT) 0.4 MG SL tablet Place 1 tablet (0.4 mg total) under the tongue every 5 (five) minutes as needed for chest pain. 25 tablet 3  . ondansetron (ZOFRAN) 4 MG tablet Take 1 tablet (4 mg total) by mouth every 8 (eight) hours as needed for nausea or vomiting. 20 tablet 0  . PARoxetine (PAXIL) 20 MG tablet Take 0.5 tablets by mouth daily.    . rosuvastatin (CRESTOR) 20 MG tablet Take 10 mg by mouth daily.    . tadalafil (CIALIS) 20 MG tablet Take 20 mg by mouth daily as needed for erectile dysfunction. Patient takes as needed    . testosterone cypionate (DEPOTESTOSTERONE CYPIONATE) 200 MG/ML injection Inject 200 mg into the muscle once a week. 1 ml weekly    . traMADol-acetaminophen (ULTRACET) 37.5-325 MG tablet Take 2 tablets by mouth in the morning, at noon, and at bedtime.     . valsartan (DIOVAN) 160 MG tablet Take 1 tablet (160 mg total) by mouth daily. 90 tablet 3   No current facility-administered medications for this visit.    Allergies as of 12/19/2020 - Review Complete 12/19/2020  Allergen Reaction Noted  . Garlic Anaphylaxis 23/76/2831  . Onion Anaphylaxis 01/11/2014  . Amlodipine  12/19/2020  . Budesonide-formoterol fumarate  12/19/2020  . Buprenorphine Nausea Only 12/19/2020  . Buspar [buspirone]  12/19/2020  . Celecoxib  12/19/2020  . Codeine Other (See Comments) 03/03/2012  . Duloxetine  12/19/2020  . Fluticasone  12/19/2020  . Fluticasone-salmeterol  12/19/2020  . Glucosamine forte [nutritional supplements]  12/19/2020  . Hydrocodone  12/12/2015  . Ibuprofen  12/19/2020  . Lisinopril  12/19/2020  . Mometasone  12/19/2020  . Novocain [procaine] Nausea Only 07/13/2013  . Sildenafil  12/19/2020  . Topiramate Nausea Only 12/19/2020  . Trazodone and nefazodone  12/19/2020    Vitals: BP  (!) 160/78 (BP Location: Left Arm, Patient Position: Sitting)   Pulse 88   Ht 5' 11.5" (1.816 m)   Wt 237 lb (107.5 kg)   BMI 32.59 kg/m  Last Weight:  Wt Readings from Last 1 Encounters:  12/19/20 237 lb (107.5 kg)   Last Height:   Ht Readings from Last 1 Encounters:  12/19/20 5' 11.5" (1.816 m)    Physical exam: stable Exam: Gen: NAD, conversant, well nourised, obese, well groomed                     CV: RRR, no MRG. No Carotid Bruits. No peripheral edema, warm, nontender Eyes: Conjunctivae clear without exudates or hemorrhage MSK: Decreased range of motion in the cervical spine  Neuro: stable no changes Detailed Neurologic Exam  Speech:    Speech is normal; fluent and spontaneous with normal comprehension.  Cognition:    The patient is oriented to person, place, and time;  recent and remote memory intact;     language fluent;     normal attention, concentration,     fund of knowledge  MMSE - Mini Mental State Exam 12/19/2020  Orientation to time 5  Orientation to Place 4  Registration 3  Attention/ Calculation 5  Recall 3  Language- name 2 objects 2  Language- repeat 1  Language- follow 3 step command 3  Language- read & follow direction 1  Write a sentence 1  Copy design 1  Total score 29    Cranial Nerves:    The pupils are equal, round, and reactive to light. The fundi are flat. Visual fields are full to threat. Extraocular movements are intact. Trigeminal sensation is intact and the muscles of mastication are normal. The face is symmetric. The palate elevates in the midline. Hearing intact. Voice is normal. Shoulder shrug is normal. The tongue has normal motion without fasciculations.   Coordination:    No dysmetria or ataxia  Gait:    not ataxic, good stride and arm swing, imbalance with tandem  Motor Observation:    No asymmetry, no atrophy, and no involuntary movements noted. Tone:    Normal muscle tone.    Posture:    Posture is  normal. normal erect    Strength:    Strength is V/V in the upper and lower limbs.      Sensation: dec sensation right 4th digit     Reflex Exam:  DTR's: Decr right deltoid. Otherwise deep tendon reflexes in the upper and lower extremities are brisk bilaterally.  Positive Hoffmann sign in the bilateral upper extremities. Toes:    The toes are upgoing bilaterally.   Clonus:    Clonus  AJs.     Assessment/Plan:   Tzion Wedel Dakin is a 80 y.o. male here for a new concern memory loss. PMHx DVT and PE on chronic anticoagulation (e;iquis), CKD, lacunar strokes in 2015 on MRI brain, headaches, hyperlipidemia, depression, neck trauma 1970 at c3-c4. He has chronic neck pain with radicular symptoms into the right hand. He has been evaluated by Dr. Rolena Infante and extensive cervical surgery was discussed including ACDF and C3-C7 fusion. MRI of the cervical spine shows multilevel significant cervical degenerative disc disease  and spinal cord atrophy at C3-C4 possibly with contribution from a previous injury.  Here today for memory loss: Multifactorial including chronic pain, untreated sleep apnea, polypharmacy, normal cognitve aging, depression. I do not suspect neurodegenerative disorder in this patient in fact he looks great for all of his medical problems. However he is 11 and feels decline in short-term memory so will order a formal neurcogognitive examination as a baseline in case he continues to progress and we need to retest him in the future.  PRIOR: Chronic Pain: - He had multiple opinions, saw [facility], saw orthopaedics, saw [place] [facility] and decided not to get cervical surgery due to risks. He continues to be in pain.   -  He tried Gabapentin, Lyrica and has tried opioids.  He went to pain management at [facility] and they "gave up on me".  He is here for chronic pain management - can try Amitriptyline however patient should be seen at a different formal pain clinic, he needs  chronic pain management which I do not provide.   Untreated sleep apnea: - Snoring, excessive daytime fatigue, diagnosed with sleep apnea in the past untreated. Highly recommend, discussed sequelae of untreated sleep apnea including strokes.  Orders Placed This Encounter  Procedures  .  B12 and Folate Panel  . Methylmalonic acid, serum  . Homocysteine  . TSH  . Vitamin B1  . Ambulatory referral to Neuropsychology     CC: Caren Griffins white  Sarina Ill, MD  [facility] [address] [place], [place] [address]  Phone [phone] Fax [phone]  I spent over 40 minutes of face-to-face and non-face-to-face time with patient on the  1. Memory loss    diagnosis.  This included previsit chart review, lab review, study review, order entry, electronic health record documentation, patient education on the different diagnostic and therapeutic options, counseling and coordination of care, risks and benefits of management, compliance, or risk factor reduction

## 2020-12-19 ENCOUNTER — Ambulatory Visit: Payer: Medicare Other | Admitting: Neurology

## 2020-12-19 ENCOUNTER — Encounter: Payer: Self-pay | Admitting: Neurology

## 2020-12-19 ENCOUNTER — Encounter: Payer: Self-pay | Admitting: *Deleted

## 2020-12-19 ENCOUNTER — Other Ambulatory Visit: Payer: Self-pay

## 2020-12-19 VITALS — BP 160/78 | HR 88 | Ht 71.5 in | Wt 237.0 lb

## 2020-12-19 DIAGNOSIS — R4189 Other symptoms and signs involving cognitive functions and awareness: Secondary | ICD-10-CM

## 2020-12-19 DIAGNOSIS — R413 Other amnesia: Secondary | ICD-10-CM

## 2020-12-19 NOTE — Patient Instructions (Signed)
  Blood work Formal memory Oceanographer Strategies  1. Use "WARM" strategy.  W= write it down  A= associate it  R= repeat it  M= make a mental note  2.   You can keep a Social worker.  Use a 3-ring notebook with sections for the following: calendar, important names and phone numbers,  medications, doctors' names/phone numbers, lists/reminders, and a section to journal what you did  each day.   3.    Use a calendar to write appointments down.  4.    Write yourself a schedule for the day.  This can be placed on the calendar or in a separate section of the Memory Notebook.  Keeping a  regular schedule can help memory.  5.    Use medication organizer with sections for each day or morning/evening pills.  You may need help loading it  6.    Keep a basket, or pegboard by the door.  Place items that you need to take out with you in the basket or on the pegboard.  You may also want to  include a message board for reminders.  7.    Use sticky notes.  Place sticky notes with reminders in a place where the task is performed.  For example: " turn off the  stove" placed by the stove, "lock the door" placed on the door at eye level, " take your medications" on  the bathroom mirror or by the place where you normally take your medications.  8.    Use alarms/timers.  Use while cooking to remind yourself to check on food or as a reminder to take your medicine, or as a  reminder to make a call, or as a reminder to perform another task, etc.

## 2020-12-20 ENCOUNTER — Other Ambulatory Visit: Payer: Self-pay

## 2020-12-20 ENCOUNTER — Encounter: Payer: Self-pay | Admitting: Counselor

## 2020-12-20 ENCOUNTER — Other Ambulatory Visit (INDEPENDENT_AMBULATORY_CARE_PROVIDER_SITE_OTHER): Payer: Self-pay

## 2020-12-20 DIAGNOSIS — Z0289 Encounter for other administrative examinations: Secondary | ICD-10-CM

## 2020-12-20 DIAGNOSIS — R413 Other amnesia: Secondary | ICD-10-CM | POA: Diagnosis not present

## 2020-12-22 ENCOUNTER — Telehealth: Payer: Self-pay | Admitting: *Deleted

## 2020-12-22 NOTE — Telephone Encounter (Signed)
-----   Message from Melvenia Beam, MD sent at 12/21/2020  6:13 PM EST ----- TSH is abnormal, I would follow up with your primary care. Next time you see Dr. Dema Severin, she can perform some other blood tests to see if you are hypothyroid. Hypothyroidism can cause oreceived memory/cognitive changes. Thanks. (Dr. Ferdie Ping)

## 2020-12-22 NOTE — Telephone Encounter (Signed)
Spoke with patient and discussed the lab results per Dr Jaynee Eagles. Pt understands the recommendation to follow-up with Dr Dema Severin for evaluation for possible hypothyroidism and patient is aware this condition can cause perceived memory/cognitive changes. He verbalized appreciation for the call.

## 2020-12-23 ENCOUNTER — Encounter: Payer: Self-pay | Admitting: Cardiology

## 2020-12-23 ENCOUNTER — Telehealth (INDEPENDENT_AMBULATORY_CARE_PROVIDER_SITE_OTHER): Payer: Medicare Other | Admitting: Cardiology

## 2020-12-23 VITALS — BP 130/70 | HR 85 | Ht 71.5 in | Wt 230.0 lb

## 2020-12-23 DIAGNOSIS — I10 Essential (primary) hypertension: Secondary | ICD-10-CM

## 2020-12-23 DIAGNOSIS — I493 Ventricular premature depolarization: Secondary | ICD-10-CM | POA: Diagnosis not present

## 2020-12-23 DIAGNOSIS — Z86711 Personal history of pulmonary embolism: Secondary | ICD-10-CM

## 2020-12-23 DIAGNOSIS — E785 Hyperlipidemia, unspecified: Secondary | ICD-10-CM

## 2020-12-23 DIAGNOSIS — I472 Ventricular tachycardia: Secondary | ICD-10-CM

## 2020-12-23 DIAGNOSIS — I2089 Other forms of angina pectoris: Secondary | ICD-10-CM

## 2020-12-23 DIAGNOSIS — I2510 Atherosclerotic heart disease of native coronary artery without angina pectoris: Secondary | ICD-10-CM | POA: Diagnosis not present

## 2020-12-23 DIAGNOSIS — I208 Other forms of angina pectoris: Secondary | ICD-10-CM | POA: Diagnosis not present

## 2020-12-23 DIAGNOSIS — Z79899 Other long term (current) drug therapy: Secondary | ICD-10-CM

## 2020-12-23 DIAGNOSIS — Z9861 Coronary angioplasty status: Secondary | ICD-10-CM

## 2020-12-23 DIAGNOSIS — I4729 Other ventricular tachycardia: Secondary | ICD-10-CM

## 2020-12-23 MED ORDER — AMIODARONE HCL 100 MG PO TABS
100.0000 mg | ORAL_TABLET | Freq: Every day | ORAL | 1 refills | Status: DC
Start: 2020-12-23 — End: 2021-12-19

## 2020-12-23 NOTE — Progress Notes (Signed)
Virtual Visit via Telephone Note   This visit type was conducted due to national recommendations for restrictions regarding the COVID-19 Pandemic (e.g. social distancing) in an effort to limit this patient's exposure and mitigate transmission in our community.  Due to his co-morbid illnesses, this patient is at least at moderate risk for complications without adequate follow up.  This format is felt to be most appropriate for this patient at this time.  The patient did not have access to video technology/had technical difficulties with video requiring transitioning to audio format only (telephone).  All issues noted in this document were discussed and addressed.  No physical exam could be performed with this format.  Please refer to the patient's chart for his  consent to telehealth for [facility].   Patient has given verbal permission to conduct this visit via virtual appointment and to bill insurance 01/09/2021 1:33 AM     Patient opted out of attempting to use video conferencing.  Evaluation Performed:  Follow-up visit  Date:  01/09/2021   ID:  [name], DOB 11/19/1940, MRN [id number]  Patient Location: Home Provider Location: Office/Clinic  PCP:  Harlan Stains, MD  Cardiologist:  Glenetta Hew, MD  Electrophysiologist:  None   Chief Complaint:   Chief Complaint  Patient presents with  . Follow-up    3-month follow-up  . Coronary Artery Disease    No further angina.  . Frequent PVCs    Recently seen by Dr. Hillery Jacks on amiodarone    ====================================  ASSESSMENT & PLAN:    Problem List Items Addressed This Visit    Atypical angina ([place]) -Class II-III - Primary (Chronic)    No further angina following PCI.  Negative/nonischemic Myoview in November.  Normal EF on echo.  No wall motion normalities.  Plan:  Not on beta-blocker, on amiodarone.  Will reduce dose to 1/2 tablet daily.       Relevant Medications   amiodarone (PACERONE) 100  MG tablet   CAD S/P percutaneous coronary angioplasty (Chronic)    No further angina after distal LAD stent.  He has mild-moderate CAD elsewhere.  Recent Myoview negative.  Normal EF   No longer on aspirin Plavix because of Eliquis.  On rosuvastatin;  On valsartan with stable blood pressure..      Relevant Medications   amiodarone (PACERONE) 100 MG tablet   Nonsustained ventricular tachycardia (HCC) (Chronic)    Frequent PVCs and NSVT on monitor, no symptoms.  Nonischemic Myoview with normal EF on echo.  Referred to EP.  Started on amiodarone.  He has noting a little bit of exercise intolerance, will reduce the dose to 1/2 tablet daily unless he has recurrent symptoms.      Relevant Medications   amiodarone (PACERONE) 100 MG tablet   Essential hypertension (Chronic)   Relevant Medications   amiodarone (PACERONE) 100 MG tablet   Hyperlipidemia with target LDL less than 70 (Chronic)   Relevant Medications   amiodarone (PACERONE) 100 MG tablet   Other Relevant Orders   TSH   T4, free   T3   Hepatic function panel   History of pulmonary embolism (Chronic)    On lifelong maintenance Eliquis.  Managed by PCP.  No longer on aspirin/Plavix.  We will defer to PCP, however it is probably okay to hold Eliquis for 2 to 3 days for procedures or surgeries      Frequent unifocal PVCs (Chronic)    Seen by ED, was switched from from beta-blocker to amiodarone.  Now reducing dose.      Relevant Medications   amiodarone (PACERONE) 100 MG tablet   On amiodarone therapy (Chronic)    Now on amiodarone for frequent PVCs and NSVT.  Noting less dyspnea and less fatigue being on amiodarone over beta-blocker.   Reduce the dose down to 1/2 tablet daily.  We will initiate baseline lab evaluation with TSH/thyroid panel, LFTs to be checked in March along with lipids.  He will need annual eye exams, and will need a baseline PFT      Relevant Orders   TSH   T4, free   T3   Hepatic  function panel      ====================================  History of Present Illness:    [name] is a 80 y.o. male with PMH notable for CAD having PCI along with frequent PVCs (with short bursts of SVT), recurrent DVT/PE, OSA, and prior CVA who presents via audio/video conferencing for a telehealth visit today as a 42-month follow-up.  [name] was last seen on September 15, 2020 via telemedicine to discuss results of his event monitor, echocardiogram and Myoview stress test.  He was doing well.  No major complaints nothing to suggest chest pain or pressure.  No sensation of irregular heartbeats or palpitations despite having frequent PVCs.  Blood pressure well controlled.  Only really noted short of breath bending over or if he overexerts.  I started nadolol 40 mg and reduced ARB dose.  Referred to EP for significant PVC burden and NSVT.[name]  Continue lifelong Eliquis for PE and stroke.  No longer on aspirin Plavix.  Hospitalizations/Other Clinic Visits:  . No hospitalizations   Seen by Dr. Lovena Le and EP on September 21, 2020.  Start a trial of amiodarone and stop nadolol (noted fatigue).  Stress test and echocardiogram reviewed.  Seen in follow-up on January 11 for PVCs-19% on monitor.  Noted exertional dyspnea for several months.  No chest pain.  Felt better after stopping alcohol and starting amiodarone.    Plan was to continue amiodarone.    Increase physical activity.    Recommended losing weight, increase exercise  Recent - Interim CV studies:    The following studies were reviewed today:  No no new tests  Inerval History   [name] Gene says he is actually doing pretty well.  He is started back exercising now is walking about half a mile a day.  He can limited by the weather and his knees hurting him during the cold weather, but is becoming more active and noticing less "huffing and puffing going up and down hills.  No chest pain or pressure.  He really  does not feel the irregular heartbeats that much, just a few skipped beats.  He does note that his energy level has improved since being off nadolol and being on amiodarone.  Cardiovascular ROS: positive for - He still has some exertional dyspnea, but more related to deconditioning. negative for - edema, irregular heartbeat, orthopnea, palpitations, paroxysmal nocturnal dyspnea, rapid heart rate, shortness of breath or Syncope/near syncope or TIA/amaurosis fugax, claudication  ROS:  Please see the history of present illness.    The patient does not have symptoms concerning for COVID-19 infection (fever, chills, cough, or new shortness of breath).  Review of Systems  Constitutional: Positive for weight loss (Mild weight loss may be 5 to 6 pounds.). Negative for malaise/fatigue (Energy level better after stopping beta-blocker.).  HENT: Negative for congestion and nosebleeds.   Respiratory: Positive for shortness  of breath (Only with vigorous exertion). Negative for cough.   Cardiovascular: Negative for palpitations (No sensation of PVCs) and leg swelling.  Gastrointestinal: Negative for blood in stool, constipation and melena.  Genitourinary: Negative for hematuria.  Musculoskeletal: Negative for back pain and joint pain.  Neurological: Negative for dizziness and focal weakness.  Psychiatric/Behavioral: Negative.     Past Medical History:  Diagnosis Date  . Abnormal heart rhythm    frequent PVCs, Dr Ellyn Hack  . Allergies    previously was seen at [facility] office  . Anxiety    Wanda Plump  . Arthritis    KNEES, NECK, SHOULDERS, HANDS  . Asthma   . Biceps tendon tear    right  . Biceps tendon tear    bilateral arms   . CAD S/P percutaneous coronary angioplasty 10/11/2018   Cardiac Cath-PCI 06/10/2018: 90% mLAD --> DES PCI (ORSIRO 2.75X18) => 0%.  pRCA 25%. p-m Cx 40%. Ost OM1 50%, ost OM2 40% with 50% mid.  pLAD 30% & ost D1 75 % (med Rx).  Normal LVEDP.  Diagnostic       Intervention   Orsiro DES 2.75 x 18       . Cancer ([place])    skin cancer-  2 basal cell, 1 squamous cell  . Cervical spinal stenosis    with foramenal stenosis  . Chronic pain    Dr Clarnce Flock to tolerate opioids, antiseizure meds (keppra, topamax, lyrica) SNRI, tramadol not effective, Dr Nelva Bush  . Congenital absence of one kidney    has the right kidney  . Depression    Wanda Plump  . DVT (deep venous thrombosis) (HCC)    hx of w/ pulmonary embolus (4) on chronic anticoagulation  . Dysthymia    Wanda Plump  . Esophageal reflux    "w/wtricture s/p dilation, Dr Oletta Lamas" per notes from [facility] at [place]  . Gallstones 04/2014   asymptomatic on CT  . GERD (gastroesophageal reflux disease)   . Gout, arthritis    BILATERAL GREAT TOE-- PER PT STABLE  . Gynecomastia    8/17 mammogram benign  . H/O transfusion of packed red blood cells    as a child/ s/p tonsillectomy  . Headache    after CVA, improved on Topamax  . Hearing loss    hearing aids 2017, [employer]  . History of pulmonary embolism    Maintained on lifelong anticoagulation with Eliquis.  . Hypogonadism in male    Dr Gilford Rile  . Lung nodule    BENIGN RIGHT UPPER--  CHEST CT 08-22-2011  . Moderate obstructive sleep apnea    (PSG 09/26/08 AHI 5/hr REM 25/hr, RDI 28/hr REM 37/hr, 02 nadir 88%; CPAP titration recommended; HSAT 02/03/18 ESS 9, AHI 21/hr, O2 min 83%), Dr Maxwell Caul, has not worn since 10/19, sleeps better without it (per [facility] [place] [facility] notes).  . Obesity   . OSA (obstructive sleep apnea)    NO CPAP -states dr said possibly related to asthma  . Paresthesias    right foot  . S/P dilatation of esophageal stricture   . Stroke [facility])    3 small lacunar infarcts, parietal lobe, 8/15--Dr Tomi Likens, R retinal artery occlusion 8/15, lost almost 50% of vision R eye  . Torn rotator cuff    Right x 3  . Tubular adenoma 01/2009   polyps, Dr Oletta Lamas   Past Surgical History:  Procedure Laterality Date   . CARPAL TUNNEL RELEASE Right 2010  . COLON SURGERY  x3  . CORONARY CALCIUM SCORING WITH CT CORONARY ANGIOGRAM  05/29/2018   Coronary calcium score 416.  Possible obstructive CAD in the mid circumflex-OM1-OM 2, D2 and a moderate disease in the proximal LAD -->  FFR --> LAD: Proximal.90, mid.86 distal.68 D2.80, Cx: Mid.76 OM2.74: Hemodynamically significant distal LAD, D2, mid circumflex and OM2 lesions  . CORONARY STENT INTERVENTION N/A 06/10/2018   Procedure: CORONARY STENT INTERVENTION;  Surgeon: Jettie Booze, MD;  Location: [place] INVASIVE CV LAB;;  90% mLAD --> DES PCI (ORSIRO 2.75X18) => 0%  . JOINT REPLACEMENT    . LEFT HEART CATH AND CORONARY ANGIOGRAPHY N/A 06/10/2018   Procedure: LEFT HEART CATH AND CORONARY ANGIOGRAPHY;  Surgeon: Jettie Booze, MD;  Location: [facility] INVASIVE CV LAB;;  90% mLAD --> DES PCI.  pRCA 25%. p-m Cx 40%. Ost OM1 50%, ost OM2 40% with 50% mid.  pLAD 30% & ost D1 75 % (med Rx).  Normal LVEDP.   [name] NEPHRECTOMY    . SHOULDER ARTHROSCOPY Right   . SHOULDER ARTHROSCOPY Left 2021  . TONSILLECTOMY  AGE 80  . TOTAL KNEE ARTHROPLASTY Left 10-15-2008  . TOTAL KNEE ARTHROPLASTY Right 01/18/2014   Procedure: RIGHT TOTAL KNEE ARTHROPLASTY;  Surgeon: Gearlean Alf, MD;  Location: [facility] ORS;  Service: Orthopedics;  Laterality: Right;  . TRANSTHORACIC ECHOCARDIOGRAM  01/2018    Normal LV size with moderate LVH.  EF 60 to 65% with GR 1 DD (normal for age)  . WRIST GANGLION EXCISION Right 1964      Cardiac Cath-PCI 06/10/2018:90% mLAD -->DES PCI (ORSIRO 2.75X18) =>0%. pRCA 25%. p-m Cx 40%. Ost OM1 50%, ost OM2 40% with 50% mid. pLAD 30% &ost D1 75 % (med Rx). Normal LVEDP. Intervention Orsiro DES 2.75 x 18    24 hr Zio Patch (08/04/2020): demonstrates significant amount of PVCs with one run of nonsustained V. tach and 2 short bursts of SVT.  - --> Recommend 2D echocardiogram and Lexiscan Myoview -> for CAD-PCI, frequent PVCs, nonsustained V. tach  Predominant  rhythm is sinus with a minimum heart rate of 71 bpm, maximum of 157 bpm and average of 98 bpm.  Rare PACs with 2 short bursts of SVT ranging 4-6 beats max rate 174 bpm.  Frequent PVCs (19.2%) with occasional couplets and rare triplets. Also ventricular bigeminy and trigeminy noted.  1 run of 7 beats nonsustained V. tach maximum rate to 50 bpm.  No patient diary noted.  . Myoview 09/09/2020: LVEF roughly 50%.  Normal response to exercise.  No EKG changes.  Medium sized severe fixed defect in the basal inferior mid inferior location either consistent with diaphragmatic attenuation or prior infarct.  No ischemia noted.  Normal wall motion noted.  Suggested echocardiogram to evaluate wall motion. . Echocardiogram on 08/17/2020: EF 60 to 65%.  No R WMA.  Essentially normal valves.   Current Meds  Medication Sig  . allopurinol (ZYLOPRIM) 300 MG tablet Take 300 mg by mouth daily.  [name] apixaban (ELIQUIS) 5 MG TABS tablet Take 5 mg by mouth 2 (two) times daily.  . clonazePAM (KLONOPIN) 0.5 MG tablet Take 0.5 mg by mouth daily.  . methocarbamol (ROBAXIN) 750 MG tablet Take 750 mg by mouth 3 (three) times daily.  . mirtazapine (REMERON) 7.5 MG tablet Take 7.5 mg by mouth at bedtime.  . nitroGLYCERIN (NITROSTAT) 0.4 MG SL tablet Place 1 tablet (0.4 mg total) under the tongue every 5 (five) minutes as needed for chest pain.  [name] ondansetron (ZOFRAN) 4  MG tablet Take 1 tablet (4 mg total) by mouth every 8 (eight) hours as needed for nausea or vomiting.  [name] PARoxetine (PAXIL) 20 MG tablet Take 0.5 tablets by mouth daily.  . tadalafil (CIALIS) 20 MG tablet Take 20 mg by mouth daily as needed for erectile dysfunction. Patient takes as needed  . testosterone cypionate (DEPOTESTOSTERONE CYPIONATE) 200 MG/ML injection Inject 200 mg into the muscle once a week. 1 ml weekly  . traMADol-acetaminophen (ULTRACET) 37.5-325 MG tablet Take 2 tablets by mouth in the morning, at noon, and at bedtime.   . valsartan (DIOVAN)  160 MG tablet Take 1 tablet (160 mg total) by mouth daily.  . [DISCONTINUED] amiodarone (PACERONE) 200 MG tablet Take 1 tablet (200 mg total) by mouth daily.  . [DISCONTINUED] rosuvastatin (CRESTOR) 20 MG tablet Take 10 mg by mouth daily.     Allergies:   Garlic, Onion, Amlodipine, Budesonide-formoterol fumarate, Buprenorphine, Buspar [buspirone], Celecoxib, Codeine, Duloxetine, Fluticasone, Fluticasone-salmeterol, Glucosamine forte [nutritional supplements], Hydrocodone, Ibuprofen, Lisinopril, Mometasone, Novocain [procaine], Sildenafil, Topiramate, and Trazodone and nefazodone   Social History   Tobacco Use  . Smoking status: Former Smoker    Packs/day: 1.00    Years: 25.00    Pack years: 25.00    Types: Cigarettes    Quit date: 07/13/1980    Years since quitting: 40.5  . Smokeless tobacco: Never Used  Vaping Use  . Vaping Use: Never used  Substance Use Topics  . Alcohol use: Yes    Comment: RARE  . Drug use: No     Family Hx: The patient's family history includes Colon cancer in his mother; Deep vein thrombosis in his father; Stroke in his brother and father. There is no history of Migraines, Pancreatic cancer, or Stomach cancer.   Labs/Other Tests and Data Reviewed:    EKG:  No ECG reviewed.  Recent Labs: 07/12/2020: BUN 16; Creatinine, Ser 1.44; Hemoglobin 16.8; Platelets 219; Potassium 4.4; Sodium 140 08/10/2020: ALT 13 12/20/2020: TSH 5.920   Recent Lipid Panel Lab Results  Component Value Date/Time   CHOL 92 (L) 08/10/2020 08:14 AM   TRIG 128 08/10/2020 08:14 AM   HDL 31 (L) 08/10/2020 08:14 AM   CHOLHDL 3.0 08/10/2020 08:14 AM   CHOLHDL 4.5 07/05/2014 07:36 AM   LDLCALC 38 08/10/2020 08:14 AM   Lab Results  Component Value Date   TSH 5.920 (H) 12/20/2020    Wt Readings from Last 3 Encounters:  12/23/20 230 lb (104.3 kg)  12/19/20 237 lb (107.5 kg)  11/15/20 236 lb 6.4 oz (107.2 kg)     Objective:    Vital Signs:  BP 130/70   Pulse 85   Ht 5' 11.5"  (1.816 m)   Wt 230 lb (104.3 kg)   BMI 31.63 kg/m   VITAL SIGNS:  reviewed Pleasant gentleman, no acute distress. A&O x 3.  Normal Mood & Affect Non-labored respirations  ==========================================  COVID-19 Education: The signs and symptoms of COVID-19 were discussed with the patient and how to seek care for testing (follow up with PCP or arrange E-visit).   The importance of social distancing was discussed today.  The patient is practicing social distancing.  Time:   Today, I have spent 18 minutes with the patient with telehealth technology discussing the above problems.   An additional 15 minutes spent charting (reviewing prior notes, hospital records, studies, labs etc.) Total 73minutes   Medication Adjustments/Labs and Tests Ordered: Current medicines are reviewed at length with the patient today.  Concerns  regarding medicines are outlined above.   Patient Instructions  Medication Instructions:    Reduce dose of amiodarone to 1/2 tablet daily  *If you need a refill on your cardiac medications before your next appointment, please call your pharmacy*   Lab Work:   Complete thyroid panel TSH, free T4 and T3 levels) and liver function tests -to be checked on or about March 15  If you have labs (blood work) drawn today and your tests are completely normal, you will receive your results only by: [name] MyChart Message (if you have MyChart) OR . A paper copy in the mail If you have any lab test that is abnormal or we need to change your treatment, we will call you to review the results.   Testing/Procedures: None for now   Follow-Up: At [facility], you and your health needs are our priority.  As part of our continuing mission to provide you with exceptional heart care, we have created designated Provider Care Teams.  These Care Teams include your primary Cardiologist (physician) and Advanced Practice Providers (APPs -  Physician Assistants and Nurse  Practitioners) who all work together to provide you with the care you need, when you need it.  We recommend signing up for the patient portal called "MyChart".  Sign up information is provided on this After Visit Summary.  MyChart is used to connect with patients for Virtual Visits (Telemedicine).  Patients are able to view lab/test results, encounter notes, upcoming appointments, etc.  Non-urgent messages can be sent to your provider as well.   To learn more about what you can do with MyChart, go to [email or web address].    Your next appointment:   6 month(s)  The format for your next appointment:   In Person  Provider:   Glenetta Hew, MD   Other Instructions Keep working on diet and exercise.  The more you exercise, the better you will feel..  Recommend that you do have an eye exam done this year, and they need to know that you are on amiodarone.     Signed, Glenetta Hew, MD  01/09/2021 1:33 AM    [place]

## 2020-12-23 NOTE — Patient Instructions (Addendum)
Medication Instructions:    Reduce dose of amiodarone to 1/2 tablet daily  *If you need a refill on your cardiac medications before your next appointment, please call your pharmacy*   Lab Work:   Complete thyroid panel TSH, free T4 and T3 levels) and liver function tests -to be checked on or about March 15  If you have labs (blood work) drawn today and your tests are completely normal, you will receive your results only by: [name] MyChart Message (if you have MyChart) OR . A paper copy in the mail If you have any lab test that is abnormal or we need to change your treatment, we will call you to review the results.   Testing/Procedures: None for now   Follow-Up: At [facility], you and your health needs are our priority.  As part of our continuing mission to provide you with exceptional heart care, we have created designated Provider Care Teams.  These Care Teams include your primary Cardiologist (physician) and Advanced Practice Providers (APPs -  Physician Assistants and Nurse Practitioners) who all work together to provide you with the care you need, when you need it.  We recommend signing up for the patient portal called "MyChart".  Sign up information is provided on this After Visit Summary.  MyChart is used to connect with patients for Virtual Visits (Telemedicine).  Patients are able to view lab/test results, encounter notes, upcoming appointments, etc.  Non-urgent messages can be sent to your provider as well.   To learn more about what you can do with MyChart, go to [email or web address].    Your next appointment:   6 month(s)  The format for your next appointment:   In Person  Provider:   Glenetta Hew, MD   Other Instructions Keep working on diet and exercise.  The more you exercise, the better you will feel..  Recommend that you do have an eye exam done this year, and they need to know that you are on amiodarone.

## 2021-01-02 LAB — VITAMIN B1: Thiamine: 295.6 nmol/L — ABNORMAL HIGH (ref 66.5–200.0)

## 2021-01-02 LAB — TSH: TSH: 5.92 u[IU]/mL — ABNORMAL HIGH (ref 0.450–4.500)

## 2021-01-02 LAB — B12 AND FOLATE PANEL
Folate: 17.7 ng/mL (ref >3.0)
Vitamin B-12: 616 pg/mL (ref 232–1245)

## 2021-01-02 LAB — METHYLMALONIC ACID, SERUM: Methylmalonic Acid: 224 nmol/L (ref 0–378)

## 2021-01-02 LAB — HOMOCYSTEINE: Homocysteine: 15.1 umol/L (ref 0.0–19.2)

## 2021-01-03 ENCOUNTER — Telehealth: Payer: Self-pay | Admitting: Cardiology

## 2021-01-03 MED ORDER — ROSUVASTATIN CALCIUM 20 MG PO TABS: 10.0000 mg | ORAL_TABLET | Freq: Every day | ORAL | 2 refills | Status: DC

## 2021-01-03 NOTE — Telephone Encounter (Cosign Needed)
New message:      [name] from [facility] calling concering this patient. She is working on medication for [employer] rosuvastatin the patient is taking 1/2 tab and if so it need to be changed on the prescription.

## 2021-01-03 NOTE — Telephone Encounter (Signed)
Spoke with Vicente Males and clarified rosuvastatin dosage. Sent updated prescription to [facility] Oppenheim to show 0.5tab daily.

## 2021-01-09 ENCOUNTER — Encounter: Payer: Self-pay | Admitting: Cardiology

## 2021-01-09 NOTE — Assessment & Plan Note (Addendum)
On lifelong maintenance Eliquis.  Managed by PCP.  No longer on aspirin/Plavix.  We will defer to PCP, however it is probably okay to hold Eliquis for 2 to 3 days for procedures or surgeries

## 2021-01-09 NOTE — Assessment & Plan Note (Addendum)
No further angina following PCI.  Negative/nonischemic Myoview in November.  Normal EF on echo.  No wall motion normalities.  Plan:  Not on beta-blocker, on amiodarone.  Will reduce dose to 1/2 tablet daily.

## 2021-01-09 NOTE — Assessment & Plan Note (Signed)
No further angina after distal LAD stent.  He has mild-moderate CAD elsewhere.  Recent Myoview negative.  Normal EF   No longer on aspirin Plavix because of Eliquis.  On rosuvastatin;  On valsartan with stable blood pressure.[name]

## 2021-01-09 NOTE — Assessment & Plan Note (Signed)
Seen by ED, was switched from from beta-blocker to amiodarone.  Now reducing dose.

## 2021-01-09 NOTE — Assessment & Plan Note (Signed)
Frequent PVCs and NSVT on monitor, no symptoms.  Nonischemic Myoview with normal EF on echo.  Referred to EP.  Started on amiodarone.  He has noting a little bit of exercise intolerance, will reduce the dose to 1/2 tablet daily unless he has recurrent symptoms.

## 2021-01-09 NOTE — Assessment & Plan Note (Signed)
Now on amiodarone for frequent PVCs and NSVT.  Noting less dyspnea and less fatigue being on amiodarone over beta-blocker.   Reduce the dose down to 1/2 tablet daily.  We will initiate baseline lab evaluation with TSH/thyroid panel, LFTs to be checked in March along with lipids.  He will need annual eye exams, and will need a baseline PFT

## 2021-01-19 DIAGNOSIS — D0439 Carcinoma in situ of skin of other parts of face: Secondary | ICD-10-CM | POA: Diagnosis not present

## 2021-01-19 DIAGNOSIS — C44319 Basal cell carcinoma of skin of other parts of face: Secondary | ICD-10-CM | POA: Diagnosis not present

## 2021-01-19 DIAGNOSIS — D2271 Melanocytic nevi of right lower limb, including hip: Secondary | ICD-10-CM | POA: Diagnosis not present

## 2021-01-19 DIAGNOSIS — D225 Melanocytic nevi of trunk: Secondary | ICD-10-CM | POA: Diagnosis not present

## 2021-01-19 DIAGNOSIS — Z85828 Personal history of other malignant neoplasm of skin: Secondary | ICD-10-CM | POA: Diagnosis not present

## 2021-01-19 DIAGNOSIS — L821 Other seborrheic keratosis: Secondary | ICD-10-CM | POA: Diagnosis not present

## 2021-01-19 DIAGNOSIS — D0461 Carcinoma in situ of skin of right upper limb, including shoulder: Secondary | ICD-10-CM | POA: Diagnosis not present

## 2021-01-23 ENCOUNTER — Other Ambulatory Visit: Payer: Self-pay | Admitting: Cardiovascular Disease

## 2021-01-23 DIAGNOSIS — Z79899 Other long term (current) drug therapy: Secondary | ICD-10-CM | POA: Diagnosis not present

## 2021-01-23 DIAGNOSIS — E785 Hyperlipidemia, unspecified: Secondary | ICD-10-CM | POA: Diagnosis not present

## 2021-01-23 LAB — TSH: TSH: 6.54 u[IU]/mL — ABNORMAL HIGH (ref 0.450–4.500)

## 2021-01-23 LAB — HEPATIC FUNCTION PANEL
ALT: 28 IU/L (ref 0–44)
AST: 22 IU/L (ref 0–40)
Albumin: 4.6 g/dL (ref 3.7–4.7)
Alkaline Phosphatase: 61 IU/L (ref 44–121)
Bilirubin Total: 0.7 mg/dL (ref 0.0–1.2)
Bilirubin, Direct: 0.21 mg/dL (ref 0.00–0.40)
Total Protein: 7.1 g/dL (ref 6.0–8.5)

## 2021-01-23 LAB — T4, FREE: Free T4: 1.13 ng/dL (ref 0.82–1.77)

## 2021-01-23 LAB — T3: T3, Total: 83 ng/dL (ref 71–180)

## 2021-01-27 DIAGNOSIS — Z79899 Other long term (current) drug therapy: Secondary | ICD-10-CM

## 2021-01-28 NOTE — Telephone Encounter (Signed)
The time differential for having reduced to the amiodarone dose to 1 recheck the thyroid levels would not have likely made a difference yet.   Dh   Message text    You  Cristopher Estimable, RN 1 minute ago (1:49 AM)     Although the TSH is gone up, the free T4 and T3 levels are still in the normal range.   The important results are free T4 and T3-TSH is just a marker.    Since the numbers are a little bit above normal, we will continue to follow this relatively closely, but it does not necessarily mean we change therapy yet. We would simply recheck entire thyroid panel including free T4 and T3 along with TSH in about 2 months. --> amiodarone can cause either hyper or hypothyroidism. If the TSH goes up, this would indicate low thyroid not high. The true indication for stopping amiodarone is high thyroid levels (that would be high T3 and T4 levels, with low TSH) -> if free T4 and T3 levels go down and TSH goes up, then there is an option of treating with thyroid replacement.    As long as the free T4 and T3 levels are okay, we do not need to make any changes yet. Hopefully by reducing the dose to 100 mg daily, levels will stay stable.   Glenetta Hew, MD   Message text

## 2021-01-30 NOTE — Telephone Encounter (Signed)
Lab orders mailed to the pt.  

## 2021-02-06 DIAGNOSIS — M25811 Other specified joint disorders, right shoulder: Secondary | ICD-10-CM | POA: Diagnosis not present

## 2021-02-08 ENCOUNTER — Ambulatory Visit: Payer: Medicare Other | Admitting: Psychology

## 2021-02-08 ENCOUNTER — Encounter: Payer: Self-pay | Admitting: Counselor

## 2021-02-08 ENCOUNTER — Ambulatory Visit (INDEPENDENT_AMBULATORY_CARE_PROVIDER_SITE_OTHER): Payer: Medicare Other | Admitting: Counselor

## 2021-02-08 ENCOUNTER — Other Ambulatory Visit: Payer: Self-pay

## 2021-02-08 DIAGNOSIS — R4189 Other symptoms and signs involving cognitive functions and awareness: Secondary | ICD-10-CM | POA: Diagnosis not present

## 2021-02-08 DIAGNOSIS — G3184 Mild cognitive impairment, so stated: Secondary | ICD-10-CM

## 2021-02-08 DIAGNOSIS — R4689 Other symptoms and signs involving appearance and behavior: Secondary | ICD-10-CM

## 2021-02-08 DIAGNOSIS — F09 Unspecified mental disorder due to known physiological condition: Secondary | ICD-10-CM

## 2021-02-08 DIAGNOSIS — F418 Other specified anxiety disorders: Secondary | ICD-10-CM | POA: Diagnosis not present

## 2021-02-08 NOTE — Progress Notes (Signed)
   Psychometrist Note   Cognitive testing was administered to [employer] by Milana Kidney, B.S. (Technician) under the supervision of Alphonzo Severance, Psy.D., ABN. Mr. Gottwald was able to tolerate all test procedures. Dr. Nicole Kindred met with the patient as needed to manage any emotional reactions to the testing procedures. Rest breaks were offered.    The battery of tests administered was selected by Dr. Nicole Kindred with consideration to the patient's current level of functioning, the nature of his symptoms, emotional and behavioral responses during the interview, level of literacy, observed level of motivation/effort, and the nature of the referral question. This battery was communicated to the psychometrist. Communication between Dr. Nicole Kindred and the psychometrist was ongoing throughout the evaluation and Dr. Nicole Kindred was immediately accessible at all times. Dr. Nicole Kindred provided supervision to the technician on the date of this service, to the extent necessary to assure the quality of all services provided.    Mr. Mclear will return in approximately one week for an interactive feedback session with Dr. Nicole Kindred, at which time test performance, clinical impressions, and treatment recommendations will be reviewed in detail. The patient understands he can contact our office should he require our assistance before this time.   A total of 105 minutes of billable time were spent with Juanito Doom Baune by the technician, including test administration and scoring time. Billing for these services is reflected in Dr. Les Pou note.   This note reflects time spent with the psychometrician and does not include test scores, clinical history, or any interpretations made by Dr. Nicole Kindred. The full report will follow in a separate note.
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
## 2021-02-09 DIAGNOSIS — F332 Major depressive disorder, recurrent severe without psychotic features: Secondary | ICD-10-CM | POA: Diagnosis not present

## 2021-02-09 DIAGNOSIS — G4701 Insomnia due to medical condition: Secondary | ICD-10-CM | POA: Diagnosis not present

## 2021-02-09 DIAGNOSIS — F411 Generalized anxiety disorder: Secondary | ICD-10-CM | POA: Diagnosis not present

## 2021-02-09 DIAGNOSIS — G894 Chronic pain syndrome: Secondary | ICD-10-CM | POA: Diagnosis not present

## 2021-02-09 DIAGNOSIS — C44319 Basal cell carcinoma of skin of other parts of face: Secondary | ICD-10-CM | POA: Diagnosis not present

## 2021-02-09 NOTE — Progress Notes (Signed)
[facility]  Patient Name: [name] MRN: [id number] Date of Birth: 03-10-1941 Age: 80 y.o. Education: 16 years  Measurement properties of test scores: IQ, Index, and Standard Scores (SS): Mean = 100; Standard Deviation = 15 Scaled Scores (Ss): Mean = 10; Standard Deviation = 3 Z scores (Z): Mean = 0; Standard Deviation = 1 T scores (T); Mean = 50; Standard Deviation = 10  TEST SCORES:    Note: This summary of test scores accompanies the interpretive report and should not be interpreted by unqualified individuals or in isolation without reference to the report. Test scores are relative to age, gender, and educational history as available and appropriate.   Performance Validity        "A" Random Letter Test Raw  Descriptor      Errors 0 Within Expectation  The Dot Counting Test: 9 Within Expectation  NAB Effort Index 0 Within Expectation      Mental Status Screening     Total Score Descriptor  MoCA 26 Normal      Expected Functioning        Wide Range Achievement Test: Standard/Scaled Score Percentile      Word Reading 107 68      Reynolds Intellectual Screening Test Standard/T-score Percentile      Guess What 63 91      Odd Item Out 66 95  RIST Index 128 97      Attention/Processing Speed        Neuropsychological Assessment Battery (Attention Module, Form 1): T-score Percentile      Digits Forward 73 99      Digits Backwards 50 50      Repeatable Battery for the Assessment of Neuropsychological Status (Form A): Scaled Score Percentile      Coding 11 63      Language        Neuropsychological Assessment Battery (Language Module, Form 1): T-score Percentile      Naming   (31) 59 82      Verbal Fluency: T-score Percentile      Controlled Oral Word Association (F-A-S) 42 21      Semantic Fluency (Animals) 53 62      Memory:        Neuropsychological Assessment Battery (Memory Module, Form 1): T-score Percentile       List Learning           List A Immediate Recall   (5, 8, 8) 48 42         List B Immediate Recall   (5) 56 73         List A Short Delayed Recall   (8) 55 69         List A Long Delayed Recall   (8) 56 73         List A Percent Retention   (100 %) --- 54         List A Long Delayed Yes/No Recognition Hits   (10) --- 38         List A Long Delayed Yes/No Recognition False Alarms   (0) --- 93         List A Recognition Discriminability Index --- 88      Story Learning           Immediate Recall   (30, 36) 56 73         Delayed Recall   (32) 51 54  Percent Retention   (89 %) --- 58      Daily Living Memory            Immediate Recall   (26, 16) 51 54          Delayed Recall   (9, 5) 51 54          Percent Retention (88 %) --- 54          Recognition Hits    (9) --- 69      Repeatable Battery for the Assessment of Neuropsychological Status (Form A): Scaled Score Percentile         Figure Recall   (17) 13 84      Visuospatial/Constructional Functioning        Repeatable Battery for the Assessment of Neuropsychological Status (Form A): Standard/Scaled Score Percentile     Visuospatial/Constructional Index 112 79         Figure Copy   (19) 12 75         Judgment of Line Orientation   (18) --- 51-75      Executive Functioning        [employer] Test (MWCST): Standard/T-Score Percentile      Number of Categories Correct 59 82      Number of Perseverative Errors 68 96      Number of Total Errors 58 79      Percent Perseverative Errors 66 95  Executive Function Composite 122 93      Trail Making Test: T-Score Percentile      Part A 44 27      Part B 58 79      [place] Diagnostic Aphasia Exam: Raw Score Scaled Score      Complex Ideational Material 12 12      Clock Drawing Raw Score Descriptor      Command 10 WNL      Rating Scales         Raw Score Descriptor  Beck Depresson Inventory - II 14 Mild  Beck Anxiety Inventory 12 Mild      Clinical  Dementia Rating Raw Score Descriptor      Sum of Boxes 0.5 MCI      Global Score 0.5 MCI      Quick Dementia Rating System Raw Score Descriptor      Sum of Boxes 1 MCI      Total Score 2 MCI   [name] V. Nicole Kindred, PsyD, [place]

## 2021-02-13 NOTE — Progress Notes (Signed)
[facility]  Patient Name: ARIA PICKRELL MRN: [id number] Date of Birth: 26-May-1941 Age: 80 y.o. Education: 17 years  Clinical Impressions  Abdullahi Vallone [place] is a 80 y.o., right-hand dominant, married man with a history of multiple orthorpedic issues, DVT/PE, CAD h/p angioplasty, and memory and thinking problems over perhaps the past year or so. His wife noticed his changes most conspicuously from August, 2021 - January, 2022, a period of time during which he was experiencing increased depressive symptoms and panic attacks related to marital problems. He is doing much better now since stabilization but they still wanted to have his issues evaluated. He has a an MRI from from 12/2020, which shows some mild ventriculomegaly and prominent CSF spaces in the sylvian fissures bilaterally. He has some minimal areas of leukoaraiosis, not enough to cause a dementia level of function and borderline as a contributor in my opinion.   Mr. Appleget demonstrated very high levels of premorbid ability, with overall functioning in the superior range. Even relative to that stringent standard, his test data is all within normal limits with average or better performance on every measure administered. He reported mild levels of depressive symptoms and anxiety symptoms on self-report screening inventories. His wife characterized him as functioning at an MCI level at most on severity status staging.   Mr. Appleget is thus demonstrating good cognitive performance. My sense is that his perceived memory problems are likely related to his psychiatric condition, as he appears to be actively symptomatic of a depressive illness. He also has significant ongoing issues with chronic pain, which may be contributing to distraction, fatigue, and irritability that can interfere with cognitive efficiency. He is already on medication but may benefit from more extensive involvement in psychotherapy.    Diagnostic Impressions: Depressive disorder Cognitive complaints with normal neuropsychological exam  Recommendations to be discussed with patient  Your performance and presentation on assessment were consistent with normal performance. That is, you performed within normal limits on all tests administered. This is reassuring and suggests that you are aging gracefully from a cognitive perspective.   I would like to point out that you did exceptionally well on a screening measure of overall ability, with a score in the superior range.   I think that the most likely cause of your cognitive problems is your depressive illness. You are doing much better than you were previously, which is excellent, but it sounds like you are still not getting much enjoyment out of life and are not feeling very positive about things. Given that your medication seems to be helping, it may be time to consider adjuvent treatment with psychotherapy. I would suggest that you pursue counseling with a licensed provider, a mental health specialist, as opposed to a Writer. I believe you have had some pastoral counseling in the past but I think you maximize your chances of finding a provider with the best possible training in empirically supported treatment methods by going to a mental health specialist.    There is a significant research base and evidence of effectiveness for something called "behavioral activation," which is a fancy way of saying that you should increase your activity level. In general, people do not feel as happy or do as well when they are not doing much. This can include things like getting out for walks, re-engaging in hobbies, spending time with family or friends, or learning a new hobby. It's not so important what you do as that you enjoy it and  stick with it. Depression can start a vicious cycle where you are not doing a lot because you don't feel well, which leads to less things to be excited  and happy about, and thus more depression and behavioral avoidance. It can be hard to change this pattern once it has started but most people find that they feel better when they start doing more even if they don't enjoy it at first.   Chronic pain can decrease cognitive efficiency and disrupt ongoing processing of information, decrease your focus from the task at hand, and negatively affect your mood. While entirely eliminating your pain may not be a reasonable goal, you can minimize its impact on your day-to-day functioning. This may include learning to tolerate a certain amount of pain to do activities that are meaningful and enjoyable for you. You should make sure that it is safe for you to do more activities in consultation with a medical provider.     Test Findings  Test scores are summarized in additional documentation associated with this encounter. Test scores are relative to age, gender, and educational history as available and appropriate. There were no concerns about performance validity as all findings fell within normal expectations.   General Intellectual Functioning/Achievement:  Performance on single word reading was at the uppermost extreme of the average range whereas performance on the RIST index was very strong, in the superior range. His RIST index was used as a basis of comparison for memory and other cognitive test scores.   Attention and Processing Efficiency: Performance was good on measures of attention and working memory with a very superior errorless performance, which is truly outstanding. His digit repetition backward was comparably weak but still within the average range. Timed number symbol coding was average.   Language: Indicators of language functioning showed normal performance with errorless visual object confrontation naming. Generation of words in response to the letters F-A-S was low average whereas generation of animals in one minute was average.    Visuospatial Function: Performance was high average overall on measures of visuospatial and constructional abilities. Figure copy was nearly errorless and at the top of the average range. Judgment of angular line orientations was average to high average.   Learning and Memory: Performance on measures of learning and memory showed average or better scores on all indicators. The profile shows good acquisition and retention of information across time and is unconcerning for any frank impairment.   In the verbal realm, performance when learning verbal material including a 12-item word list, short story, and brief daily-living type information was average followed by comparable average range delayed recall with good retention of information across time. Recognition discriminability for the word list and daily living information was good.   In the visual realm, delayed recall of a modestly complex figure was good, with a high average score.   Executive Functions: Performance on executive measures showed very good performance with a superior score on the [employer] Composite of the [place] of words in response to the letters F-A-S was average. Performance was high average on alternating sequencing of numbers and letters of the alphabet. Clock drawing was average.   Rating Scale(s): Mr. Appleget reported a mild yet clinically significant level of anxiety and depressive symptoms on self-rating scales. His wife characterized him as functioning at a questionable cognitive impairment to MCI level. I was able to rate a CDR for him and would place him at a questionable cognitive impairment level, his  only complaints are memory changes.  Viviano Simas Nicole Kindred, PsyD, ABN Clinical Neuropsychologist  Coding and Compliance  Billing below reflects technician time, my direct face-to-face time with the patient, time spent in test administration, and time spent in  professional activities including but not limited to: neuropsychological test interpretation, integration of neuropsychological test data with clinical history, report preparation, treatment planning, care coordination, and review of diagnostically pertinent medical history or studies.   Services associated with this encounter: Clinical Interview [phone]) plus 175 minutes ([id number]; Neuropsychological Evaluation by Professional)  105 minutes ([id number]; Neuropsychological Testing by Technician)

## 2021-02-15 ENCOUNTER — Other Ambulatory Visit: Payer: Self-pay

## 2021-02-15 ENCOUNTER — Ambulatory Visit (INDEPENDENT_AMBULATORY_CARE_PROVIDER_SITE_OTHER): Payer: Medicare Other | Admitting: Counselor

## 2021-02-15 ENCOUNTER — Encounter: Payer: Self-pay | Admitting: Counselor

## 2021-02-15 DIAGNOSIS — F329 Major depressive disorder, single episode, unspecified: Secondary | ICD-10-CM

## 2021-02-15 DIAGNOSIS — F32A Depression, unspecified: Secondary | ICD-10-CM

## 2021-02-15 NOTE — Patient Instructions (Signed)
Your performance and presentation on assessment were consistent with normal performance. That is, you performed within normal limits on all tests administered. This is reassuring and suggests that you are aging gracefully from a cognitive perspective.   I would like to point out that you did exceptionally well on a screening measure of overall ability, with a score in the superior range.   I think that the most likely cause of your cognitive problems is your depressive illness. You are doing much better than you were previously, which is excellent, but it sounds like you are still not getting much enjoyment out of life and are not feeling very positive about things. Given that your medication seems to be helping, it may be time to consider adjuvent treatment with psychotherapy. I would suggest that you pursue counseling with a licensed provider, a mental health specialist, as opposed to a Writer. We discussed the fact that many of your issues do have to do with your relationship and that couples counseling could be particularly helpful.   There is a significant research base and evidence of effectiveness for something called "behavioral activation," which is a fancy way of saying that you should increase your activity level. In general, people do not feel as happy or do as well when they are not doing much. This can include things like getting out for walks, re-engaging in hobbies, spending time with family or friends, or learning a new hobby. It's not so important what you do as that you enjoy it and stick with it. Depression can start a vicious cycle where you are not doing a lot because you don't feel well, which leads to less things to be excited and happy about, and thus more depression and behavioral avoidance. It can be hard to change this pattern once it has started but most people find that they feel better when they start doing more even if they don't enjoy it at first.   I think it is  excellent that you have started working to get active and for a source of meaning and I would encourage you to continue.   Chronic pain can decrease cognitive efficiency and disrupt ongoing processing of information, decrease your focus from the task at hand, and negatively affect your mood. While entirely eliminating your pain may not be a reasonable goal, you can minimize its impact on your day-to-day functioning. This may include learning to tolerate a certain amount of pain to do activities that are meaningful and enjoyable for you. You should make sure that it is safe for you to do more activities in consultation with a medical provider.

## 2021-02-15 NOTE — Progress Notes (Signed)
NEUROPSYCHOLOGY FEEDBACK NOTE [facility]  Feedback Note: I met with [name] to review the findings resulting from his neuropsychological evaluation. Since the last appointment, he has been about the same. He did have surgery to remove a basal cell carcinoma, which left him with a black eye. Time was spent reviewing the impressions and recommendations that are detailed in the evaluation report. We discussed impression of very good cognitive perfomance, as reflected in the patient instructions. He was not surprised that he did well. I counseled him about the effects of chronic pain and mood on cognition. Much of his depression is related to sexual dissatisfaction within his relationship, I recommended they consider couples counseling. I took time to explain the findings and answer all the patient's questions. I encouraged Mr. Justiss to contact me should he have any further questions or if further follow up is desired.   Current Medications and Medical History   Current Outpatient Medications  Medication Sig Dispense Refill  . allopurinol (ZYLOPRIM) 300 MG tablet Take 300 mg by mouth daily.    [name] amiodarone (PACERONE) 100 MG tablet Take 1 tablet (100 mg total) by mouth daily. 90 tablet 1  . apixaban (ELIQUIS) 5 MG TABS tablet Take 5 mg by mouth 2 (two) times daily.    . clonazePAM (KLONOPIN) 0.5 MG tablet Take 0.5 mg by mouth daily.    . methocarbamol (ROBAXIN) 750 MG tablet Take 750 mg by mouth 3 (three) times daily.    . mirtazapine (REMERON) 7.5 MG tablet Take 7.5 mg by mouth at bedtime.    . nitroGLYCERIN (NITROSTAT) 0.4 MG SL tablet Place 1 tablet (0.4 mg total) under the tongue every 5 (five) minutes as needed for chest pain. 25 tablet 3  . ondansetron (ZOFRAN) 4 MG tablet Take 1 tablet (4 mg total) by mouth every 8 (eight) hours as needed for nausea or vomiting. 20 tablet 0  . PARoxetine (PAXIL) 20 MG tablet Take 0.5 tablets by mouth daily.    . rosuvastatin (CRESTOR) 20  MG tablet Take 0.5 tablets (10 mg total) by mouth daily. 45 tablet 2  . tadalafil (CIALIS) 20 MG tablet Take 20 mg by mouth daily as needed for erectile dysfunction. Patient takes as needed    . testosterone cypionate (DEPOTESTOSTERONE CYPIONATE) 200 MG/ML injection Inject 200 mg into the muscle once a week. 1 ml weekly    . traMADol-acetaminophen (ULTRACET) 37.5-325 MG tablet Take 2 tablets by mouth in the morning, at noon, and at bedtime.     . valsartan (DIOVAN) 160 MG tablet Take 1 tablet (160 mg total) by mouth daily. 90 tablet 3   No current facility-administered medications for this visit.    Patient Active Problem List   Diagnosis Date Noted  . On amiodarone therapy 12/23/2020  . Subjective memory complaints 12/19/2020  . Dysphagia 08/11/2020  . Esophageal stricture 08/11/2020  . Family hx of colon cancer 08/11/2020  . Family hx colonic polyps 08/11/2020  . Chronic anticoagulation 08/11/2020  . Nonsustained ventricular tachycardia ([place]) 08/04/2020  . Frequent unifocal PVCs 07/25/2020  . History of pulmonary embolism 06/25/2019  . CAD S/P percutaneous coronary angioplasty 10/11/2018  . Hyperlipidemia with target LDL less than 70 10/11/2018  . Abnormal cardiac CT angiography 06/05/2018  . Essential hypertension 04/07/2018  . Atypical angina ([place]) -Class II-III 04/07/2018  . Allergic rhinitis 03/17/2018  . Spondylosis, cervical, with myelopathy 12/18/2015  . Cervical disc disorder with radiculopathy of cervical region 12/18/2015  . Spinal cord injury,  cervical region ([place]) 12/18/2015  . Central retinal artery occlusion 07/02/2014  . Cerebral infarction due to unspecified mechanism 07/02/2014  . DOE (dyspnea on exertion) 06/08/2014  . Postoperative anemia due to acute blood loss 01/20/2014  . OA (osteoarthritis) of knee 01/18/2014    Mental Status and Behavioral Observations  [name] presented on time to the present encounter and was alert and generally oriented.  Speech was normal in rate, rhythm, volume, and prosody. Self-reported mood was "good" and affect was euthymic. Thought process was logical and goal oriented and thought content was appropriate to the evaluative context. There were no safety concerns identified at today's encounter, such as thoughts of harming self or others.   Plan  Feedback provided regarding the patient's neuropsychological evaluation. He is doing very well, cognitively, and now has a strong baseline to go off of if needed in the future. [name] was encouraged to contact me if any questions arise or if further follow up is desired.   Joseph Simas Nicole Kindred, PsyD, ABN Clinical Neuropsychologist  Service(s) Provided at This Encounter: 28 minutes [phone]; Psychotherapy with patient/family)

## 2021-02-16 ENCOUNTER — Telehealth: Payer: Self-pay | Admitting: *Deleted

## 2021-02-16 NOTE — Telephone Encounter (Addendum)
I called the patient and let him know that since his neurocognitive testing was normal there is no need to follow-up with neurology here at this office.  He verbalized understanding and appreciation for the call.  ----- Message from Melvenia Beam, MD sent at 02/16/2021  8:13 AM EDT ----- Since neurocognitive testing is normal there is no need to follow up with [place]. Let him know, thanks

## 2021-02-23 DIAGNOSIS — R972 Elevated prostate specific antigen [PSA]: Secondary | ICD-10-CM | POA: Diagnosis not present

## 2021-02-23 DIAGNOSIS — D0439 Carcinoma in situ of skin of other parts of face: Secondary | ICD-10-CM | POA: Diagnosis not present

## 2021-02-23 DIAGNOSIS — D0461 Carcinoma in situ of skin of right upper limb, including shoulder: Secondary | ICD-10-CM | POA: Diagnosis not present

## 2021-03-01 DIAGNOSIS — G4701 Insomnia due to medical condition: Secondary | ICD-10-CM | POA: Diagnosis not present

## 2021-03-01 DIAGNOSIS — F411 Generalized anxiety disorder: Secondary | ICD-10-CM | POA: Diagnosis not present

## 2021-03-01 DIAGNOSIS — G894 Chronic pain syndrome: Secondary | ICD-10-CM | POA: Diagnosis not present

## 2021-03-01 DIAGNOSIS — F332 Major depressive disorder, recurrent severe without psychotic features: Secondary | ICD-10-CM | POA: Diagnosis not present

## 2021-03-02 DIAGNOSIS — G894 Chronic pain syndrome: Secondary | ICD-10-CM | POA: Diagnosis not present

## 2021-03-02 DIAGNOSIS — F411 Generalized anxiety disorder: Secondary | ICD-10-CM | POA: Diagnosis not present

## 2021-03-02 DIAGNOSIS — G4701 Insomnia due to medical condition: Secondary | ICD-10-CM | POA: Diagnosis not present

## 2021-03-02 DIAGNOSIS — F332 Major depressive disorder, recurrent severe without psychotic features: Secondary | ICD-10-CM | POA: Diagnosis not present

## 2021-03-02 DIAGNOSIS — R972 Elevated prostate specific antigen [PSA]: Secondary | ICD-10-CM | POA: Diagnosis not present

## 2021-03-06 DIAGNOSIS — R066 Hiccough: Secondary | ICD-10-CM | POA: Diagnosis not present

## 2021-03-06 DIAGNOSIS — F419 Anxiety disorder, unspecified: Secondary | ICD-10-CM | POA: Diagnosis not present

## 2021-03-06 DIAGNOSIS — T50905A Adverse effect of unspecified drugs, medicaments and biological substances, initial encounter: Secondary | ICD-10-CM | POA: Diagnosis not present

## 2021-03-07 DIAGNOSIS — M25812 Other specified joint disorders, left shoulder: Secondary | ICD-10-CM | POA: Diagnosis not present

## 2021-03-07 DIAGNOSIS — M25512 Pain in left shoulder: Secondary | ICD-10-CM | POA: Diagnosis not present

## 2021-03-08 ENCOUNTER — Other Ambulatory Visit: Payer: Self-pay

## 2021-03-08 ENCOUNTER — Encounter (HOSPITAL_COMMUNITY): Payer: Self-pay

## 2021-03-08 ENCOUNTER — Emergency Department (HOSPITAL_COMMUNITY): Payer: Medicare Other

## 2021-03-08 ENCOUNTER — Inpatient Hospital Stay (HOSPITAL_COMMUNITY)
Admission: EM | Admit: 2021-03-08 | Discharge: 2021-03-13 | DRG: 204 | Disposition: A | Discharge status: Home Health | Payer: Medicare Other | Attending: Internal Medicine | Admitting: Internal Medicine

## 2021-03-08 DIAGNOSIS — Z96653 Presence of artificial knee joint, bilateral: Secondary | ICD-10-CM | POA: Diagnosis present

## 2021-03-08 DIAGNOSIS — R066 Hiccough: Secondary | ICD-10-CM | POA: Diagnosis not present

## 2021-03-08 DIAGNOSIS — I493 Ventricular premature depolarization: Secondary | ICD-10-CM | POA: Diagnosis present

## 2021-03-08 DIAGNOSIS — Z9861 Coronary angioplasty status: Secondary | ICD-10-CM

## 2021-03-08 DIAGNOSIS — I10 Essential (primary) hypertension: Secondary | ICD-10-CM | POA: Diagnosis not present

## 2021-03-08 DIAGNOSIS — Z885 Allergy status to narcotic agent status: Secondary | ICD-10-CM | POA: Diagnosis not present

## 2021-03-08 DIAGNOSIS — Z20822 Contact with and (suspected) exposure to covid-19: Secondary | ICD-10-CM | POA: Diagnosis present

## 2021-03-08 DIAGNOSIS — N281 Cyst of kidney, acquired: Secondary | ICD-10-CM | POA: Diagnosis not present

## 2021-03-08 DIAGNOSIS — G9340 Encephalopathy, unspecified: Secondary | ICD-10-CM | POA: Diagnosis present

## 2021-03-08 DIAGNOSIS — Z86711 Personal history of pulmonary embolism: Secondary | ICD-10-CM

## 2021-03-08 DIAGNOSIS — K760 Fatty (change of) liver, not elsewhere classified: Secondary | ICD-10-CM | POA: Diagnosis not present

## 2021-03-08 DIAGNOSIS — G928 Other toxic encephalopathy: Secondary | ICD-10-CM | POA: Diagnosis not present

## 2021-03-08 DIAGNOSIS — N1831 Chronic kidney disease, stage 3a: Secondary | ICD-10-CM | POA: Diagnosis present

## 2021-03-08 DIAGNOSIS — M109 Gout, unspecified: Secondary | ICD-10-CM | POA: Diagnosis present

## 2021-03-08 DIAGNOSIS — Z955 Presence of coronary angioplasty implant and graft: Secondary | ICD-10-CM

## 2021-03-08 DIAGNOSIS — R1013 Epigastric pain: Secondary | ICD-10-CM | POA: Diagnosis present

## 2021-03-08 DIAGNOSIS — Z905 Acquired absence of kidney: Secondary | ICD-10-CM

## 2021-03-08 DIAGNOSIS — K7689 Other specified diseases of liver: Secondary | ICD-10-CM | POA: Diagnosis not present

## 2021-03-08 DIAGNOSIS — F419 Anxiety disorder, unspecified: Secondary | ICD-10-CM | POA: Diagnosis not present

## 2021-03-08 DIAGNOSIS — Z800 Family history of malignant neoplasm of digestive organs: Secondary | ICD-10-CM

## 2021-03-08 DIAGNOSIS — S0191XA Laceration without foreign body of unspecified part of head, initial encounter: Secondary | ICD-10-CM | POA: Diagnosis not present

## 2021-03-08 DIAGNOSIS — R079 Chest pain, unspecified: Secondary | ICD-10-CM | POA: Diagnosis not present

## 2021-03-08 DIAGNOSIS — E785 Hyperlipidemia, unspecified: Secondary | ICD-10-CM | POA: Diagnosis present

## 2021-03-08 DIAGNOSIS — I2510 Atherosclerotic heart disease of native coronary artery without angina pectoris: Secondary | ICD-10-CM | POA: Diagnosis not present

## 2021-03-08 DIAGNOSIS — I129 Hypertensive chronic kidney disease with stage 1 through stage 4 chronic kidney disease, or unspecified chronic kidney disease: Secondary | ICD-10-CM | POA: Diagnosis present

## 2021-03-08 DIAGNOSIS — R0600 Dyspnea, unspecified: Secondary | ICD-10-CM | POA: Diagnosis not present

## 2021-03-08 DIAGNOSIS — Z886 Allergy status to analgesic agent status: Secondary | ICD-10-CM

## 2021-03-08 DIAGNOSIS — Z7901 Long term (current) use of anticoagulants: Secondary | ICD-10-CM

## 2021-03-08 DIAGNOSIS — J309 Allergic rhinitis, unspecified: Secondary | ICD-10-CM | POA: Diagnosis not present

## 2021-03-08 DIAGNOSIS — T50915A Adverse effect of multiple unspecified drugs, medicaments and biological substances, initial encounter: Secondary | ICD-10-CM | POA: Diagnosis not present

## 2021-03-08 DIAGNOSIS — K439 Ventral hernia without obstruction or gangrene: Secondary | ICD-10-CM | POA: Diagnosis present

## 2021-03-08 DIAGNOSIS — B3781 Candidal esophagitis: Secondary | ICD-10-CM | POA: Diagnosis present

## 2021-03-08 DIAGNOSIS — I471 Supraventricular tachycardia: Secondary | ICD-10-CM | POA: Diagnosis present

## 2021-03-08 DIAGNOSIS — I700 Atherosclerosis of aorta: Secondary | ICD-10-CM | POA: Diagnosis not present

## 2021-03-08 DIAGNOSIS — I340 Nonrheumatic mitral (valve) insufficiency: Secondary | ICD-10-CM | POA: Diagnosis not present

## 2021-03-08 DIAGNOSIS — K209 Esophagitis, unspecified without bleeding: Secondary | ICD-10-CM | POA: Diagnosis not present

## 2021-03-08 DIAGNOSIS — Z881 Allergy status to other antibiotic agents status: Secondary | ICD-10-CM

## 2021-03-08 DIAGNOSIS — Z79899 Other long term (current) drug therapy: Secondary | ICD-10-CM

## 2021-03-08 DIAGNOSIS — K8020 Calculus of gallbladder without cholecystitis without obstruction: Secondary | ICD-10-CM | POA: Diagnosis present

## 2021-03-08 DIAGNOSIS — R4182 Altered mental status, unspecified: Secondary | ICD-10-CM | POA: Diagnosis not present

## 2021-03-08 DIAGNOSIS — R0789 Other chest pain: Secondary | ICD-10-CM | POA: Diagnosis present

## 2021-03-08 DIAGNOSIS — F32A Depression, unspecified: Secondary | ICD-10-CM | POA: Diagnosis present

## 2021-03-08 DIAGNOSIS — Z79890 Hormone replacement therapy (postmenopausal): Secondary | ICD-10-CM

## 2021-03-08 DIAGNOSIS — Z87891 Personal history of nicotine dependence: Secondary | ICD-10-CM

## 2021-03-08 DIAGNOSIS — I472 Ventricular tachycardia: Secondary | ICD-10-CM | POA: Diagnosis present

## 2021-03-08 DIAGNOSIS — W1830XA Fall on same level, unspecified, initial encounter: Secondary | ICD-10-CM | POA: Diagnosis not present

## 2021-03-08 DIAGNOSIS — Z823 Family history of stroke: Secondary | ICD-10-CM

## 2021-03-08 DIAGNOSIS — K648 Other hemorrhoids: Secondary | ICD-10-CM | POA: Diagnosis present

## 2021-03-08 DIAGNOSIS — R0602 Shortness of breath: Secondary | ICD-10-CM | POA: Diagnosis not present

## 2021-03-08 DIAGNOSIS — E669 Obesity, unspecified: Secondary | ICD-10-CM | POA: Diagnosis present

## 2021-03-08 DIAGNOSIS — Z9109 Other allergy status, other than to drugs and biological substances: Secondary | ICD-10-CM | POA: Diagnosis not present

## 2021-03-08 DIAGNOSIS — Z8719 Personal history of other diseases of the digestive system: Secondary | ICD-10-CM | POA: Diagnosis not present

## 2021-03-08 DIAGNOSIS — E7800 Pure hypercholesterolemia, unspecified: Secondary | ICD-10-CM | POA: Diagnosis not present

## 2021-03-08 DIAGNOSIS — K449 Diaphragmatic hernia without obstruction or gangrene: Secondary | ICD-10-CM | POA: Diagnosis present

## 2021-03-08 DIAGNOSIS — G9341 Metabolic encephalopathy: Secondary | ICD-10-CM | POA: Diagnosis not present

## 2021-03-08 DIAGNOSIS — Z6831 Body mass index (BMI) 31.0-31.9, adult: Secondary | ICD-10-CM

## 2021-03-08 DIAGNOSIS — K2100 Gastro-esophageal reflux disease with esophagitis, without bleeding: Secondary | ICD-10-CM | POA: Diagnosis present

## 2021-03-08 HISTORY — DX: Essential (primary) hypertension: I10

## 2021-03-08 LAB — HEPATIC FUNCTION PANEL
ALT: 25 U/L (ref 0–44)
AST: 27 U/L (ref 15–41)
Albumin: 4.3 g/dL (ref 3.5–5.0)
Alkaline Phosphatase: 49 U/L (ref 38–126)
Bilirubin, Direct: 0.2 mg/dL (ref 0.0–0.2)
Indirect Bilirubin: 0.6 mg/dL (ref 0.3–0.9)
Total Bilirubin: 0.8 mg/dL (ref 0.3–1.2)
Total Protein: 7.4 g/dL (ref 6.5–8.1)

## 2021-03-08 LAB — CBC WITH DIFFERENTIAL/PLATELET
Abs Immature Granulocytes: 0.04 10*3/uL (ref 0.00–0.07)
Basophils Absolute: 0 10*3/uL (ref 0.0–0.1)
Basophils Relative: 0 %
Eosinophils Absolute: 0 10*3/uL (ref 0.0–0.5)
Eosinophils Relative: 0 %
HCT: 49.6 % (ref 39.0–52.0)
Hemoglobin: 16.9 g/dL (ref 13.0–17.0)
Immature Granulocytes: 0 %
Lymphocytes Relative: 7 %
Lymphs Abs: 0.8 10*3/uL (ref 0.7–4.0)
MCH: 33.3 pg (ref 26.0–34.0)
MCHC: 34.1 g/dL (ref 30.0–36.0)
MCV: 97.8 fL (ref 80.0–100.0)
Monocytes Absolute: 0.4 10*3/uL (ref 0.1–1.0)
Monocytes Relative: 4 %
Neutro Abs: 9.6 10*3/uL — ABNORMAL HIGH (ref 1.7–7.7)
Neutrophils Relative %: 89 %
Platelets: 221 10*3/uL (ref 150–400)
RBC: 5.07 MIL/uL (ref 4.22–5.81)
RDW: 13.5 % (ref 11.5–15.5)
WBC: 10.8 10*3/uL — ABNORMAL HIGH (ref 4.0–10.5)
nRBC: 0 % (ref 0.0–0.2)

## 2021-03-08 LAB — LIPID PANEL
Cholesterol: 102 mg/dL (ref 0–200)
HDL: 40 mg/dL — ABNORMAL LOW
LDL Cholesterol: 45 mg/dL (ref 0–99)
Total CHOL/HDL Ratio: 2.6 ratio
Triglycerides: 83 mg/dL
VLDL: 17 mg/dL (ref 0–40)

## 2021-03-08 LAB — BASIC METABOLIC PANEL
Anion gap: 9 (ref 5–15)
BUN: 19 mg/dL (ref 8–23)
CO2: 27 mmol/L (ref 22–32)
Calcium: 8.9 mg/dL (ref 8.9–10.3)
Chloride: 107 mmol/L (ref 98–111)
Creatinine, Ser: 1.35 mg/dL — ABNORMAL HIGH (ref 0.61–1.24)
GFR, Estimated: 54 mL/min — ABNORMAL LOW (ref >60)
Glucose, Bld: 136 mg/dL — ABNORMAL HIGH (ref 70–99)
Potassium: 4.2 mmol/L (ref 3.5–5.1)
Sodium: 143 mmol/L (ref 135–145)

## 2021-03-08 LAB — TROPONIN I (HIGH SENSITIVITY)
Troponin I (High Sensitivity): 8 ng/L
Troponin I (High Sensitivity): 6 ng/L (ref <18)

## 2021-03-08 MED ORDER — DIAZEPAM 5 MG PO TABS
10.0000 mg | ORAL_TABLET | Freq: Once | ORAL | Status: AC
  Administered 2021-03-08: 10 mg via ORAL
  Filled 2021-03-08: qty 2

## 2021-03-08 MED ORDER — NITROGLYCERIN 0.4 MG SL SUBL
0.4000 mg | SUBLINGUAL_TABLET | Freq: Once | SUBLINGUAL | Status: AC
  Administered 2021-03-08: 0.4 mg via SUBLINGUAL
  Filled 2021-03-08: qty 1

## 2021-03-08 MED ORDER — CLONAZEPAM 0.5 MG PO TABS
0.5000 mg | ORAL_TABLET | Freq: Two times a day (BID) | ORAL | Status: DC
  Administered 2021-03-08 – 2021-03-09 (×2): 0.5 mg via ORAL
  Filled 2021-03-08 (×2): qty 1

## 2021-03-08 MED ORDER — MIRTAZAPINE 15 MG PO TABS
7.5000 mg | ORAL_TABLET | Freq: Every day | ORAL | Status: DC
  Administered 2021-03-08 – 2021-03-12 (×5): 7.5 mg via ORAL
  Filled 2021-03-08 (×5): qty 1

## 2021-03-08 MED ORDER — CHLORPROMAZINE HCL 25 MG PO TABS
25.0000 mg | ORAL_TABLET | Freq: Once | ORAL | Status: AC
  Administered 2021-03-08: 25 mg via ORAL
  Filled 2021-03-08: qty 1

## 2021-03-08 MED ORDER — IOHEXOL 350 MG/ML SOLN
100.0000 mL | Freq: Once | INTRAVENOUS | Status: AC | PRN
  Administered 2021-03-08: 100 mL via INTRAVENOUS

## 2021-03-08 MED ORDER — PAROXETINE HCL 20 MG PO TABS
20.0000 mg | ORAL_TABLET | Freq: Every day | ORAL | Status: DC
  Administered 2021-03-08 – 2021-03-12 (×5): 20 mg via ORAL
  Filled 2021-03-08 (×5): qty 1

## 2021-03-08 NOTE — ED Triage Notes (Signed)
Pt c/o chest pain/pressure and SOB. Pt stated this started at noon today and has been coming and going. Pt states rest does not alleviate the pain/SOB. Pt denies chest pain and SOB at this moment.

## 2021-03-08 NOTE — ED Notes (Signed)
ED Provider at bedside. 

## 2021-03-08 NOTE — ED Notes (Signed)
Patient transported to X-ray 

## 2021-03-08 NOTE — ED Notes (Signed)
Patient transported to CT 

## 2021-03-08 NOTE — ED Provider Notes (Signed)
Emergency Medicine Provider Triage Evaluation Note  [name] , a 80 y.o. male  was evaluated in triage.  Pt complains of chest pain.  Starting at noon today patient started having brief episodes of chest pain, lasting 30 seconds to a minute where he reports he feels like he is being kicked in the chest.  He reports when these episodes of chest pain started he also feels very short of breath.  Episodes seem to start with activity and then are resolved on their own.  History of stent, followed by Dr. Ellyn Hack and Dr. Lovena Le.  Review of Systems  Positive: CP, SOB Negative: Fever, syncope  Physical Exam  BP (!) 140/95 (BP Location: Right Arm)   Pulse (!) 105   Temp 98.4 F (36.9 C) (Oral)   Ht 6' (1.829 m)   Wt 105.2 kg   SpO2 98%   BMI 31.46 kg/m  Gen:   Awake, no distress  Resp:  Normal effort, CTA bilate MSK:   Moves extremities without difficulty    Medical Decision Making  Medically screening exam initiated at 5:14 PM.  Appropriate orders placed.  [name] was informed that the remainder of the evaluation will be completed by another provider, this initial triage assessment does not replace that evaluation, and the importance of remaining in the ED until their evaluation is complete.  Notify nursing staff patient with cardiac history and concerning symptoms, will need acute bed for more emergent evaluation.   Jacqlyn Larsen, PA-C 03/08/21 1721    Wyvonnia Dusky, MD 03/09/21 [phone]

## 2021-03-08 NOTE — ED Provider Notes (Signed)
[facility] DEPT Provider Note   CSN: [id number] Arrival date & time: 03/08/21  1707     History CC Chest pain, hiccups  [name] is a 80 y.o. male with a history of coronary artery disease status post LAD stent, multiple pulmonary embolisms on Eliquis, PVC and NSVT on amiodarone, presented to emergency department with chest pain or pressure.  He reports he has been having hiccuping all week, but felt this was related to Suboxone, a new medication he started taking the day his hiccups began, and states he is allergic to opioids.  He is beginning to have chest tightness today.  He describes a heaviness in his chest, worse with hiccuping.  He has never had this feeling before.  It is currently mild to moderate intensity.  He is prescribed nitroglycerin but did not take any at home.  His pain is worse with activity, somewhat better at rest.  He describes some mild dyspnea.  He does not take aspirin.  Per medical record review, the patient last had a stress test November 2021.  His cardiologist Dr. Ellyn Hack.  Stress test was notable for inferior wall abnormal flow, although Dr. Ellyn Hack notes this may be artifact, as the patient had a normal echocardiogram performed the month before.  Last LHC on 06/10/2018:   Prox RCA lesion is 25% stenosed.  Ost 1st Mrg lesion is 50% stenosed.  Prox Cx to Mid Cx lesion is 40% stenosed.  Ost 2nd Mrg lesion is 40% stenosed.  2nd Mrg lesion is 50% stenosed.  Prox LAD lesion is 30% stenosed.  Ost 1st Diag lesion is 75% stenosed.  Mid LAD lesion is 90% stenosed.  A drug-eluting stent was successfully placed using a STENT ORSIRO 2.75X18.  Post intervention, there is a 0% residual stenosis.  LV end diastolic pressure is normal.  There is no aortic valve stenosis.     HPI     Past Medical History:  Diagnosis Date  . Abnormal heart rhythm    frequent PVCs, Dr Ellyn Hack  . Allergies    previously was seen at  [facility] office  . Anxiety    Wanda Plump  . Arthritis    KNEES, NECK, SHOULDERS, HANDS  . Asthma   . Biceps tendon tear    right  . Biceps tendon tear    bilateral arms   . CAD S/P percutaneous coronary angioplasty 10/11/2018   Cardiac Cath-PCI 06/10/2018: 90% mLAD --> DES PCI (ORSIRO 2.75X18) => 0%.  pRCA 25%. p-m Cx 40%. Ost OM1 50%, ost OM2 40% with 50% mid.  pLAD 30% & ost D1 75 % (med Rx).  Normal LVEDP.  Diagnostic       Intervention  Orsiro DES 2.75 x 18       . Cancer ([place])    skin cancer-  2 basal cell, 1 squamous cell  . Cervical spinal stenosis    with foramenal stenosis  . Chronic pain    Dr Clarnce Flock to tolerate opioids, antiseizure meds (keppra, topamax, lyrica) SNRI, tramadol not effective, Dr Nelva Bush  . Congenital absence of one kidney    has the right kidney  . Depression    Wanda Plump  . DVT (deep venous thrombosis) (HCC)    hx of w/ pulmonary embolus (4) on chronic anticoagulation  . Dysthymia    Wanda Plump  . Esophageal reflux    "w/wtricture s/p dilation, Dr Oletta Lamas" per notes from [facility] at [place]  . Gallstones 04/2014  asymptomatic on CT  . GERD (gastroesophageal reflux disease)   . Gout, arthritis    BILATERAL GREAT TOE-- PER PT STABLE  . Gynecomastia    8/17 mammogram benign  . H/O transfusion of packed red blood cells    as a child/ s/p tonsillectomy  . Headache    after CVA, improved on Topamax  . Hearing loss    hearing aids 2017, [facility]  . History of pulmonary embolism    Maintained on lifelong anticoagulation with Eliquis.  . Hypertension   . Hypogonadism in male    Dr Gilford Rile  . Lung nodule    BENIGN RIGHT UPPER--  CHEST CT 08-22-2011  . Moderate obstructive sleep apnea    (PSG 09/26/08 AHI 5/hr REM 25/hr, RDI 28/hr REM 37/hr, 02 nadir 88%; CPAP titration recommended; HSAT 02/03/18 ESS 9, AHI 21/hr, O2 min 83%), Dr Maxwell Caul, has not worn since 10/19, sleeps better without it (per [facility]).  . Obesity   . OSA (obstructive sleep apnea)    NO CPAP -states dr said possibly related to asthma  . Paresthesias    right foot  . S/P dilatation of esophageal stricture   . Stroke [facility])    3 small lacunar infarcts, parietal lobe, 8/15--Dr Tomi Likens, R retinal artery occlusion 8/15, lost almost 50% of vision R eye  . Torn rotator cuff    Right x 3  . Tubular adenoma 01/2009   polyps, Dr Oletta Lamas    Patient Active Problem List   Diagnosis Date Noted  . Chest pain 03/09/2021  . Hiccups 03/09/2021  . On amiodarone therapy 12/23/2020  . Subjective memory complaints 12/19/2020  . Dysphagia 08/11/2020  . Esophageal stricture 08/11/2020  . Family hx of colon cancer 08/11/2020  . Family hx colonic polyps 08/11/2020  . Chronic anticoagulation 08/11/2020  . Nonsustained ventricular tachycardia ([place]) 08/04/2020  . Frequent unifocal PVCs 07/25/2020  . History of pulmonary embolism 06/25/2019  . CAD S/P percutaneous coronary angioplasty 10/11/2018  . Hyperlipidemia with target LDL less than 70 10/11/2018  . Abnormal cardiac CT angiography 06/05/2018  . Essential hypertension 04/07/2018  . Atypical angina ([place]) -Class II-III 04/07/2018  . Allergic rhinitis 03/17/2018  . Spondylosis, cervical, with myelopathy 12/18/2015  . Cervical disc disorder with radiculopathy of cervical region 12/18/2015  . Spinal cord injury, cervical region ([place]) 12/18/2015  . Central retinal artery occlusion 07/02/2014  . Cerebral infarction due to unspecified mechanism 07/02/2014  . DOE (dyspnea on exertion) 06/08/2014  . Postoperative anemia due to acute blood loss 01/20/2014  . OA (osteoarthritis) of knee 01/18/2014    Past Surgical History:  Procedure Laterality Date  . CARPAL TUNNEL RELEASE Right 2010  . COLON SURGERY     x3  . CORONARY CALCIUM SCORING WITH CT CORONARY ANGIOGRAM  05/29/2018   Coronary calcium score 416.  Possible obstructive CAD in the mid circumflex-OM1-OM 2, D2 and a  moderate disease in the proximal LAD -->  FFR --> LAD: Proximal.90, mid.86 distal.68 D2.80, Cx: Mid.76 OM2.74: Hemodynamically significant distal LAD, D2, mid circumflex and OM2 lesions  . CORONARY STENT INTERVENTION N/A 06/10/2018   Procedure: CORONARY STENT INTERVENTION;  Surgeon: Jettie Booze, MD;  Location: [facility] INVASIVE CV LAB;;  90% mLAD --> DES PCI (ORSIRO 2.75X18) => 0%  . JOINT REPLACEMENT    . LEFT HEART CATH AND CORONARY ANGIOGRAPHY N/A 06/10/2018   Procedure: LEFT HEART CATH AND CORONARY ANGIOGRAPHY;  Surgeon: Jettie Booze, MD;  Location: [facility]  CV LAB;;  90% mLAD --> DES PCI.  pRCA 25%. p-m Cx 40%. Ost OM1 50%, ost OM2 40% with 50% mid.  pLAD 30% & ost D1 75 % (med Rx).  Normal LVEDP.   [name] NEPHRECTOMY    . SHOULDER ARTHROSCOPY Right   . SHOULDER ARTHROSCOPY Left 2021  . TONSILLECTOMY  AGE 32  . TOTAL KNEE ARTHROPLASTY Left 10-15-2008  . TOTAL KNEE ARTHROPLASTY Right 01/18/2014   Procedure: RIGHT TOTAL KNEE ARTHROPLASTY;  Surgeon: Gearlean Alf, MD;  Location: [facility] ORS;  Service: Orthopedics;  Laterality: Right;  . TRANSTHORACIC ECHOCARDIOGRAM  01/2018    Normal LV size with moderate LVH.  EF 60 to 65% with GR 1 DD (normal for age)  . WRIST GANGLION EXCISION Right 1964       Family History  Problem Relation Age of Onset  . Deep vein thrombosis Father   . Stroke Father   . Stroke Brother   . Colon cancer Mother   . Migraines Neg Hx   . Pancreatic cancer Neg Hx   . Stomach cancer Neg Hx     Social History   Tobacco Use  . Smoking status: Former Smoker    Packs/day: 1.00    Years: 25.00    Pack years: 25.00    Types: Cigarettes    Quit date: 07/13/1980    Years since quitting: 40.6  . Smokeless tobacco: Never Used  Vaping Use  . Vaping Use: Never used  Substance Use Topics  . Alcohol use: Yes    Comment: RARE  . Drug use: No    Home Medications Prior to Admission medications   Medication Sig Start Date End Date Taking? Authorizing Provider   acetaminophen (TYLENOL) 500 MG tablet Take 1,000 mg by mouth every 6 (six) hours as needed.   Yes [provider]  allopurinol (ZYLOPRIM) 300 MG tablet Take 300 mg by mouth daily.   Yes [provider]  amiodarone (PACERONE) 100 MG tablet Take 1 tablet (100 mg total) by mouth daily. 12/23/20  Yes Leonie Man, MD  apixaban (ELIQUIS) 5 MG TABS tablet Take 5 mg by mouth 2 (two) times daily.   Yes [provider]  baclofen (LIORESAL) 10 MG tablet Take 10 mg by mouth 2 (two) times daily. 03/07/21  Yes [provider]  clonazePAM (KLONOPIN) 0.5 MG tablet Take 0.5 mg by mouth daily. 11/09/20  Yes [provider]  fluticasone (FLONASE) 50 MCG/ACT nasal spray Place 1 spray into both nostrils daily.   Yes [provider]  Lactobacillus (PROBIOTIC ACIDOPHILUS PO) Take 1 tablet by mouth daily.   Yes [provider]  lansoprazole (PREVACID) 15 MG capsule Take 15 mg by mouth daily at 12 noon.   Yes [provider]  Melatonin 10 MG TABS Take 10 mg by mouth daily.   Yes [provider]  mirtazapine (REMERON) 7.5 MG tablet Take 7.5 mg by mouth at bedtime. 10/18/20  Yes [provider]  Multiple Vitamin (MULTIVITAMIN WITH MINERALS) TABS tablet Take 1 tablet by mouth daily.   Yes [provider]  nitroGLYCERIN (NITROSTAT) 0.4 MG SL tablet Place 1 tablet (0.4 mg total) under the tongue every 5 (five) minutes as needed for chest pain. 06/12/18  Yes Leonie Man, MD  ondansetron (ZOFRAN) 4 MG tablet Take 1 tablet (4 mg total) by mouth every 8 (eight) hours as needed for nausea or vomiting. 03/03/20  Yes Nicholes Stairs, MD  PARoxetine (PAXIL) 20 MG tablet Take  10 mg by mouth daily. 11/09/20  Yes [provider]  rosuvastatin (CRESTOR) 20 MG tablet Take 0.5 tablets (10 mg total) by mouth daily. 01/03/21  Yes Leonie Man, MD  testosterone cypionate (DEPOTESTOSTERONE CYPIONATE) 200 MG/ML injection Inject 100  mg into the muscle once a week. 1 ml weekly 01/14/20  Yes [provider]  traMADol-acetaminophen (ULTRACET) 37.5-325 MG tablet Take 2 tablets by mouth every 8 (eight) hours as needed for moderate pain.   Yes [provider]  valsartan (DIOVAN) 160 MG tablet Take 1 tablet (160 mg total) by mouth daily. 09/15/20  Yes Leonie Man, MD  methocarbamol (ROBAXIN) 750 MG tablet Take 750 mg by mouth 3 (three) times daily.    [provider]    Allergies    Garlic, Onion, Amlodipine, Budesonide-formoterol fumarate, Buprenorphine, Buspar [buspirone], Celecoxib, Codeine, Duloxetine, Fluticasone, Fluticasone-salmeterol, Glucosamine forte [nutritional supplements], Hydrocodone, Ibuprofen, Lisinopril, Mometasone, Novocain [procaine], Sildenafil, Topiramate, and Trazodone and nefazodone  Review of Systems   Review of Systems  Constitutional: Negative for chills and fever.  HENT: Negative for ear pain and sore throat.   Eyes: Negative for pain and visual disturbance.  Respiratory: Positive for shortness of breath. Negative for cough.   Cardiovascular: Positive for chest pain. Negative for palpitations.  Gastrointestinal: Negative for abdominal pain, nausea and vomiting.  Genitourinary: Negative for dysuria and hematuria.  Musculoskeletal: Negative for arthralgias and back pain.  Skin: Negative for color change and rash.  Neurological: Negative for syncope and headaches.  All other systems reviewed and are negative.   Physical Exam Updated Vital Signs BP (!) 163/71   Pulse 94   Temp 97.7 F (36.5 C) (Oral)   Resp 17   Ht 6' (1.829 m)   Wt 105.2 kg   SpO2 98%   BMI 31.46 kg/m   Physical Exam Constitutional:      General: He is not in acute distress.    Appearance: He is obese.     Comments: Hiccuping frequently on exam  HENT:     Head: Normocephalic and atraumatic.  Eyes:     Conjunctiva/sclera: Conjunctivae normal.     Pupils: Pupils are equal, round, and  reactive to light.  Cardiovascular:     Rate and Rhythm: Normal rate and regular rhythm.  Pulmonary:     Effort: Pulmonary effort is normal. No respiratory distress.  Abdominal:     General: There is no distension.     Tenderness: There is no abdominal tenderness.  Skin:    General: Skin is warm and dry.  Neurological:     General: No focal deficit present.     Mental Status: He is alert. Mental status is at baseline.  Psychiatric:        Mood and Affect: Mood normal.        Behavior: Behavior normal.     ED Results / Procedures / Treatments   Labs (all labs ordered are listed, but only abnormal results are displayed) Labs Reviewed  BASIC METABOLIC PANEL - Abnormal; Notable for the following components:      Result Value   Glucose, Bld 136 (*)    Creatinine, Ser 1.35 (*)    GFR, Estimated 54 (*)    All other components within normal limits  CBC WITH DIFFERENTIAL/PLATELET - Abnormal; Notable for the following components:   WBC 10.8 (*)    Neutro Abs 9.6 (*)    All other components within normal limits  LIPID PANEL - Abnormal; Notable for the  following components:   HDL 40 (*)    All other components within normal limits  LIPID PANEL - Abnormal; Notable for the following components:   HDL 35 (*)    All other components within normal limits  RESP PANEL BY RT-PCR (FLU A&B, COVID) ARPGX2  HEPATIC FUNCTION PANEL  CBG MONITORING, ED  TROPONIN I (HIGH SENSITIVITY)  TROPONIN I (HIGH SENSITIVITY)  TROPONIN I (HIGH SENSITIVITY)  TROPONIN I (HIGH SENSITIVITY)    EKG EKG Interpretation  Date/Time:  Wednesday Mar 08 2021 17:20:07 EDT Ventricular Rate:  108 PR Interval:  155 QRS Duration: 82 QT Interval:  339 QTC Calculation: 455 R Axis:   -25 Text Interpretation: Sinus tachycardia Ventricular premature complex Abnormal R-wave progression, early transition 12 Lead; [place] No STEMI Confirmed by Octaviano Glow [id number]) on 03/08/2021 5:27:35 PM   Radiology DG Chest 2  View  Result Date: 03/08/2021 CLINICAL DATA:  Chest pain, dyspnea EXAM: CHEST - 2 VIEW COMPARISON:  07/12/2020 FINDINGS: Lungs are well expanded, symmetric, and clear. No pneumothorax or pleural effusion. Cardiac size within normal limits. Pulmonary vascularity is normal. Osseous structures are age-appropriate. No acute bone abnormality. IMPRESSION: No active cardiopulmonary disease. Electronically Signed   By: Fidela Salisbury MD   On: 03/08/2021 18:26   CT Angio Chest PE W and/or Wo Contrast  Result Date: 03/08/2021 CLINICAL DATA:  Chest pain, shortness of breath EXAM: CT ANGIOGRAPHY CHEST WITH CONTRAST TECHNIQUE: Multidetector CT imaging of the chest was performed using the standard protocol during bolus administration of intravenous contrast. Multiplanar CT image reconstructions and MIPs were obtained to evaluate the vascular anatomy. CONTRAST:  1103mL OMNIPAQUE IOHEXOL 350 MG/ML SOLN COMPARISON:  05/29/2018, 03/08/2021 FINDINGS: Cardiovascular: This is a technically adequate evaluation of the pulmonary vasculature. No filling defects or pulmonary emboli. The heart is unremarkable without pericardial effusion. No evidence of thoracic aortic aneurysm or dissection. Mild atherosclerosis of the aorta and coronary vasculature. Mediastinum/Nodes: No enlarged mediastinal, hilar, or axillary lymph nodes. Thyroid gland, trachea, and esophagus demonstrate no significant findings. Lungs/Pleura: No acute airspace disease, effusion, or pneumothorax. The central airways are patent. Upper Abdomen: Calcified gallstones are identified without cholecystitis. The remainder of the upper abdomen is unremarkable. Musculoskeletal: There are no acute or destructive bony lesions. Reconstructed images demonstrate no additional findings. Review of the MIP images confirms the above findings. IMPRESSION: 1. No evidence of pulmonary embolus. 2. No acute intrathoracic process. 3. Cholelithiasis without cholecystitis. 4. Aortic  Atherosclerosis (ICD10-I70.0). Coronary artery atherosclerosis. Electronically Signed   By: Randa Ngo M.D.   On: 03/08/2021 21:30    Procedures Procedures   Medications Ordered in ED Medications  clonazePAM (KLONOPIN) tablet 0.5 mg (0.5 mg Oral Given 03/08/21 2238)  PARoxetine (PAXIL) tablet 20 mg (20 mg Oral Given 03/08/21 2238)  mirtazapine (REMERON) tablet 7.5 mg (7.5 mg Oral Given 03/08/21 2238)  allopurinol (ZYLOPRIM) tablet 300 mg (has no administration in time range)  amiodarone (PACERONE) tablet 100 mg (has no administration in time range)  nitroGLYCERIN (NITROSTAT) SL tablet 0.4 mg (has no administration in time range)  rosuvastatin (CRESTOR) tablet 10 mg (has no administration in time range)  irbesartan (AVAPRO) tablet 150 mg (has no administration in time range)  PARoxetine (PAXIL) tablet 10 mg (has no administration in time range)  pantoprazole (PROTONIX) EC tablet 20 mg (has no administration in time range)  melatonin tablet 10 mg (has no administration in time range)  apixaban (ELIQUIS) tablet 5 mg (has no administration in time range)  acetaminophen (TYLENOL) tablet 650  mg (has no administration in time range)  ondansetron (ZOFRAN) injection 4 mg (has no administration in time range)  nitroGLYCERIN (NITROSTAT) SL tablet 0.4 mg (0.4 mg Sublingual Given 03/08/21 1806)  chlorproMAZINE (THORAZINE) tablet 25 mg (25 mg Oral Given 03/08/21 1806)  iohexol (OMNIPAQUE) 350 MG/ML injection 100 mL (100 mLs Intravenous Contrast Given 03/08/21 2115)  diazepam (VALIUM) tablet 10 mg (10 mg Oral Given 03/08/21 2238)    ED Course  I have reviewed the triage vital signs and the nursing notes.  Pertinent labs & imaging results that were available during my care of the patient were reviewed by me and considered in my medical decision making (see chart for details).  This patient presents to the Emergency Department with complaint of chest pain. This involves an extensive number of treatment  options, and is a complaint that carries with it a high risk of complications and morbidity.  The differential diagnosis includes ACS vs Pneumothorax vs Reflux/Gastritis vs MSK pain vs Pneumonia vs Pe vs other.   I ordered, reviewed, and interpreted labs, showing Trop low, flat, Lipid panel normal, Covid/flu negative, WBC 10.8, BMP near baseline. I ordered medication nitro SL for chest pain, thorazine and then valium for hiccups I ordered imaging studies which included CT PE  I independently visualized and interpreted imaging which showed no acute PE and the monitor tracing which showed sinus rhythm, frequent PVC's Additional history was obtained from patient's wife at bedside Previous records obtained and reviewed showing outpatient cardiac workup in 2021 I personally reviewed the patients ECG which showed sinus rhythm with PVC, with no acute ischemic findings  Patient also complaining of hiccups. Differential is broad but can include adverse medication side effects vs phrenic nerve irritation vs other.  Less likely stroke with no other acute neuro symptoms.  Less likely acute intraabdominal inflammation/diaphragm irritation with no GI symptoms, no abdominal tenderness on exam.  CT showing gallstones without evidence otherwise of inflammation.  After the interventions stated above, I reevaluated the patient and found that they remained clinically stable.  Clinical Course as of 03/09/21 1045  Wed Mar 08, 2021  2007 Tele reviewed - frequent PVC's, occasional runs of 3 PVC noted as "V Tach," but no longer episodes. [MT]  2008 CP relieved to 4/10 with SL nitro [MT]  2144 IMPRESSION: 1. No evidence of pulmonary embolus. 2. No acute intrathoracic process. 3. Cholelithiasis without cholecystitis. 4. Aortic Atherosclerosis (ICD10-I70.0). Coronary artery atherosclerosis. [MT]  2144 No acute PE.  I discussed the case with Dr Sonia Side caridology who recommended hospitalist admission for likely stress  test tomorrow and cardiology consultation.  Unclear if this is truly cardiac pain or related to reflux/hiccuping. [MT]  2222 I reassessed the patient.  He does not have any abdominal pain or tenderness.  We talked about his gallstones, no evidence of acute cholecystitis.  His wife who is an LPN is now present at bedside.  She says that he has baseline tachycardia, and his heart rate has been over 100 on previous doctor visits.  I do not suspect this is new tachycardia.  However we discussed medical admission for observation and possible stress test in the morning.  Delta troponins are flat, however hiccuping can be related to irritation of the phrenic nerve, and therefore cardiac pathology remains on the differential.   [MT]  2223 I ordered his evening medications.  We also discussed trying some Valium to help him sleep. [MT]  2321 Paged hospitalist for admission [MT]  2324 Lft unremarkable [  MT]  2339 Admitted to hospitalist [MT]    Clinical Course User Index [MT] Sarya Linenberger, Carola Rhine, MD    Final Clinical Impression(s) / ED Diagnoses Final diagnoses:  Chest pain, unspecified type  Hiccups    Rx / DC Orders ED Discharge Orders    None       Wyvonnia Dusky, MD 03/09/21 1045

## 2021-03-09 ENCOUNTER — Encounter (HOSPITAL_COMMUNITY): Payer: Self-pay | Admitting: Family Medicine

## 2021-03-09 DIAGNOSIS — Z9861 Coronary angioplasty status: Secondary | ICD-10-CM

## 2021-03-09 DIAGNOSIS — I10 Essential (primary) hypertension: Secondary | ICD-10-CM

## 2021-03-09 DIAGNOSIS — I2510 Atherosclerotic heart disease of native coronary artery without angina pectoris: Secondary | ICD-10-CM

## 2021-03-09 DIAGNOSIS — R0789 Other chest pain: Secondary | ICD-10-CM | POA: Diagnosis not present

## 2021-03-09 DIAGNOSIS — R066 Hiccough: Secondary | ICD-10-CM

## 2021-03-09 DIAGNOSIS — R079 Chest pain, unspecified: Secondary | ICD-10-CM | POA: Diagnosis present

## 2021-03-09 DIAGNOSIS — Z86711 Personal history of pulmonary embolism: Secondary | ICD-10-CM

## 2021-03-09 DIAGNOSIS — E7800 Pure hypercholesterolemia, unspecified: Secondary | ICD-10-CM

## 2021-03-09 LAB — RESP PANEL BY RT-PCR (FLU A&B, COVID) ARPGX2
Influenza A by PCR: NEGATIVE
Influenza B by PCR: NEGATIVE
SARS Coronavirus 2 by RT PCR: NEGATIVE

## 2021-03-09 LAB — LIPID PANEL
Cholesterol: 98 mg/dL (ref 0–200)
HDL: 35 mg/dL — ABNORMAL LOW (ref >40)
LDL Cholesterol: 49 mg/dL (ref 0–99)
Total CHOL/HDL Ratio: 2.8 RATIO
Triglycerides: 71 mg/dL (ref <150)
VLDL: 14 mg/dL (ref 0–40)

## 2021-03-09 LAB — TROPONIN I (HIGH SENSITIVITY): Troponin I (High Sensitivity): 9 ng/L (ref <18)

## 2021-03-09 MED ORDER — ONDANSETRON HCL 4 MG/2ML IJ SOLN: 4.0000 mg | Freq: Four times a day (QID) | INTRAMUSCULAR | Status: DC | PRN

## 2021-03-09 MED ORDER — IRBESARTAN 150 MG PO TABS
150.0000 mg | ORAL_TABLET | Freq: Every day | ORAL | Status: DC
  Administered 2021-03-09 – 2021-03-13 (×5): 150 mg via ORAL
  Filled 2021-03-09 (×5): qty 1

## 2021-03-09 MED ORDER — APIXABAN 5 MG PO TABS
5.0000 mg | ORAL_TABLET | Freq: Two times a day (BID) | ORAL | Status: DC
  Administered 2021-03-09 – 2021-03-10 (×3): 5 mg via ORAL
  Filled 2021-03-09 (×3): qty 1

## 2021-03-09 MED ORDER — CLONAZEPAM 1 MG PO TABS
1.0000 mg | ORAL_TABLET | Freq: Two times a day (BID) | ORAL | Status: DC
  Administered 2021-03-09 – 2021-03-10 (×3): 1 mg via ORAL
  Filled 2021-03-09 (×4): qty 1

## 2021-03-09 MED ORDER — PAROXETINE HCL 10 MG PO TABS
10.0000 mg | ORAL_TABLET | Freq: Every day | ORAL | Status: DC
  Administered 2021-03-09 – 2021-03-13 (×5): 10 mg via ORAL
  Filled 2021-03-09 (×5): qty 1

## 2021-03-09 MED ORDER — MELATONIN 5 MG PO TABS
10.0000 mg | ORAL_TABLET | Freq: Every day | ORAL | Status: DC
  Administered 2021-03-09 – 2021-03-12 (×4): 10 mg via ORAL
  Filled 2021-03-09 (×4): qty 2

## 2021-03-09 MED ORDER — AMIODARONE HCL 200 MG PO TABS
100.0000 mg | ORAL_TABLET | Freq: Every day | ORAL | Status: DC
  Administered 2021-03-09 – 2021-03-13 (×5): 100 mg via ORAL
  Filled 2021-03-09 (×5): qty 1

## 2021-03-09 MED ORDER — LORAZEPAM 1 MG PO TABS
1.0000 mg | ORAL_TABLET | ORAL | Status: DC | PRN
  Administered 2021-03-09 – 2021-03-11 (×7): 1 mg via ORAL
  Filled 2021-03-09 (×7): qty 1

## 2021-03-09 MED ORDER — ROSUVASTATIN CALCIUM 10 MG PO TABS
10.0000 mg | ORAL_TABLET | Freq: Every day | ORAL | Status: DC
  Administered 2021-03-09 – 2021-03-13 (×5): 10 mg via ORAL
  Filled 2021-03-09 (×5): qty 1

## 2021-03-09 MED ORDER — ACETAMINOPHEN 325 MG PO TABS: 650.0000 mg | ORAL_TABLET | ORAL | Status: DC | PRN

## 2021-03-09 MED ORDER — NITROGLYCERIN 0.4 MG SL SUBL
0.4000 mg | SUBLINGUAL_TABLET | SUBLINGUAL | Status: DC | PRN
  Administered 2021-03-09: 0.4 mg via SUBLINGUAL
  Filled 2021-03-09: qty 1

## 2021-03-09 MED ORDER — PANTOPRAZOLE SODIUM 20 MG PO TBEC
20.0000 mg | DELAYED_RELEASE_TABLET | Freq: Every day | ORAL | Status: DC
  Administered 2021-03-09 – 2021-03-10 (×2): 20 mg via ORAL
  Filled 2021-03-09 (×2): qty 1

## 2021-03-09 MED ORDER — ALLOPURINOL 300 MG PO TABS
300.0000 mg | ORAL_TABLET | Freq: Every day | ORAL | Status: DC
  Administered 2021-03-09 – 2021-03-13 (×5): 300 mg via ORAL
  Filled 2021-03-09 (×5): qty 1

## 2021-03-09 MED ORDER — MIRTAZAPINE 7.5 MG PO TABS: 7.5000 mg | ORAL_TABLET | Freq: Every day | ORAL | Status: DC

## 2021-03-09 NOTE — ED Notes (Signed)
Pt c/o bursts of chest pain, HR is going from 105-115 w PVC's, pt is hiccuping very frequently and c/o feeling like he has to gasp for air. This RN has notified MD several times and asked him to come see patient without a response. Gave pt nitro, pt sitting in chair next to bed for comfort, will continue to monitor

## 2021-03-09 NOTE — ED Notes (Signed)
Pt resting in bed, wife at bedside, denies pain, awaiting to see Cardiology, pt has hiccups, states he will speak to cardiology ab hiccups

## 2021-03-09 NOTE — Progress Notes (Signed)
PROGRESS NOTE    [name]  VQM:[id number] DOB: 13-Nov-1940 DOA: 03/08/2021 PCP: Joseph Stains, MD    Brief Narrative:  [name] was admitted to the hospital with a working diagnosis of atypical chest pain.  80 year old male past medical history for coronary artery disease, history of pulmonary embolism, hypertension, and SVT who presented with chest pain.  Reported substernal chest pain, dull/pressure in nature, with no radiation.  Associated with dyspnea and diaphoresis.  Pain seems to be worse with hiccups that he has been experiencing more frequently.  On his initial physical examination blood pressure 137/75, heart rate 91, respiratory rate 20, oxygen saturation 92%. His lungs were clear to auscultation bilaterally, heart S1-S2, present rhythmic, soft abdomen, no lower extremity edema.  Sodium 143, potassium 4.2, chloride 107, bicarb 27, glucose 136, BUN 19, creatinine 1.35, high sensitive troponin 8-6-9, white count 10.8, hemoglobin 16.9, hematocrit 49.6, platelets 221. SARS COVID-19 negative.  Chest radiograph no infiltrates. CT chest negative for pulm embolism, no infiltrates.  Positive cholelithiasis.  EKG 108 bpm, left axis deviation, normal intervals, sinus rhythm with PVCs, no ST segment or T wave changes.  Assessment & Plan:   Principal Problem:   Chest pain Active Problems:   Essential hypertension   CAD S/P percutaneous coronary angioplasty   History of pulmonary embolism   Hiccups   1. Atypical chest pain, in the setting of intractable hiccups/ anxiety . Patient ruled out for acute coronary syndrome. Hiccups seem to trigger chest discomfort, plus dyspnea.  Per his wife hiccups improve when sleeping. He does not have dysphagia or odynophagia.   Patient with diagnosis of anxiety, placed on regimen of clonazepam, mirtazapine and paroxetine.   Plan to increase clonazepam to 1 mg bid and add as needed lorazepam 1 mg q 4 hrs.  Discontinue telemetry.   2.  Hx of Pulmonary embolism. Continue anticoagulation with apixaban with good toleration.   3. CAD/ dyslipidemia. . Patient ruled out for acute coronary syndrome. Continue with statin therapy.  4. HTN. Continue blood pressure control with irbesartan.   5. SVT. Stable, patient now on sinus rhythm, continue amiodarone per home regimen.   6. Gout. Continue with allopurinol, no signs of acute exacerbation   7. CKD stage 3a. Stable renal function with serum cr at 1,35 with K at 4,2 and serum bicarbonate at 27. Patient tolerating po well.   Status is: Observation  The patient remains OBS appropriate and will d/c before 2 midnights.  Dispo: The patient is from: Home              Anticipated d/c is to: Home              Patient currently is not medically stable to d/c.   Difficult to place patient No   DVT prophylaxis: apixaban   Code Status:   full  Family Communication:  I spoke with patient's wife at the bedside, we talked in detail about patient's condition, plan of care and prognosis and all questions were addressed.     Consultants:   Cardiology    Subjective: Patient with persistent hiccups, no nausea or vomiting, no dysphagia or odynophagia, no diarrhea or constipation,   Objective: Vitals:   03/09/21 0530 03/09/21 0904 03/09/21 1025 03/09/21 1354  BP: (!) 139/98 (!) 151/75 (!) 163/71 (!) 144/67  Pulse: 84 89 94 (!) 50  Resp: 12 18 17 15   Temp:  97.7 F (36.5 C)    TempSrc:  Oral    SpO2: 95%  94% 98% 97%  Weight:      Height:       No intake or output data in the 24 hours ending 03/09/21 1439 Filed Weights   03/08/21 1718  Weight: 105.2 kg    Examination:   General: Not in pain or dyspnea.  Neurology: Awake and alert, non focal  E ENT: no pallor, no icterus, oral mucosa moist Cardiovascular: No JVD. S1-S2 present, rhythmic, no gallops, rubs, or murmurs. No lower extremity edema. Pulmonary: positive breath sounds bilaterally, adequate air movement, no  wheezing, rhonchi or rales. Gastrointestinal. Abdomen soft and non tender. Positive ventral hernia.  Skin. No rashes Musculoskeletal: no joint deformities     Data Reviewed: I have personally reviewed following labs and imaging studies  CBC: Recent Labs  Lab 03/08/21 1746  WBC 10.8*  NEUTROABS 9.6*  HGB 16.9  HCT 49.6  MCV 97.8  PLT 329   Basic Metabolic Panel: Recent Labs  Lab 03/08/21 1746  NA 143  K 4.2  CL 107  CO2 27  GLUCOSE 136*  BUN 19  CREATININE 1.35*  CALCIUM 8.9   GFR: Estimated Creatinine Clearance: 57.4 mL/min (A) (by C-G formula based on SCr of 1.35 mg/dL (H)). Liver Function Tests: Recent Labs  Lab 03/08/21 1746  AST 27  ALT 25  ALKPHOS 49  BILITOT 0.8  PROT 7.4  ALBUMIN 4.3   No results for input(s): LIPASE, AMYLASE in the last 168 hours. No results for input(s): AMMONIA in the last 168 hours. Coagulation Profile: No results for input(s): INR, PROTIME in the last 168 hours. Cardiac Enzymes: No results for input(s): CKTOTAL, CKMB, CKMBINDEX, TROPONINI in the last 168 hours. BNP (last 3 results) No results for input(s): PROBNP in the last 8760 hours. HbA1C: No results for input(s): HGBA1C in the last 72 hours. CBG: No results for input(s): GLUCAP in the last 168 hours. Lipid Profile: Recent Labs    03/08/21 1746 03/09/21 0856  CHOL 102 98  HDL 40* 35*  LDLCALC 45 49  TRIG 83 71  CHOLHDL 2.6 2.8   Thyroid Function Tests: No results for input(s): TSH, T4TOTAL, FREET4, T3FREE, THYROIDAB in the last 72 hours. Anemia Panel: No results for input(s): VITAMINB12, FOLATE, FERRITIN, TIBC, IRON, RETICCTPCT in the last 72 hours.    Radiology Studies: I have reviewed all of the imaging during this hospital visit personally     Scheduled Meds: . allopurinol  300 mg Oral Daily  . amiodarone  100 mg Oral Daily  . apixaban  5 mg Oral BID  . clonazePAM  0.5 mg Oral BID  . irbesartan  150 mg Oral Daily  . melatonin  10 mg Oral QHS   . mirtazapine  7.5 mg Oral QHS  . pantoprazole  20 mg Oral Daily  . PARoxetine  10 mg Oral Daily  . PARoxetine  20 mg Oral QHS  . rosuvastatin  10 mg Oral Daily   Continuous Infusions:   LOS: 0 days        Joseph Rinck Gerome Apley, MD

## 2021-03-09 NOTE — ED Notes (Signed)
Hospitalist states it's okay for pt to eat, changed diet order, brought pt sandwich and drink. Pt requesting something for hiccups, provider made aware

## 2021-03-09 NOTE — H&P (Signed)
History and Physical    [name] [id number] DOB: 14-Jul-1941 DOA: 03/08/2021  PCP: Harlan Stains, MD   Patient coming from:  Home  Chief Complaint: Chest pain, hiccups  HPI: [name] is a 80 y.o. male with medical history significant for CAD, hx of PE on eliquis, HTN, NSVT on amiodarone who presents for evaluation of chest pain. He reports a substernal central chest pain that is dull he describes as a heaviness and pressure that does not radiate.  Reports he has shortness of breath he came very sweaty with chest pain today prompting him to come for evaluation.  He reports he has been having hiccups for the last week.  States that he Started after he was given opiate medication by his regular doctor a week ago, he states he has a history of allergies to opioids.  The hiccups became worse with the chest pain.  States he has never had hiccups like this in the past.  He denies having any nausea or vomiting.  Reports chest pain at its worst was a 8 out of 10 and currently the chest pain is improved and no longer present.  He has nitroglycerin but he did not take any at home.  Reports that the chest pain became worse with activity and improves somewhat with rest when he was at home.  He does not take aspirin as he is on Eliquis.  Reports his last stress test was in November 2021 which showed inferior wall abnormal flow but his cardiologist felt that was artifact.  ED Course: In the emergency room patient has been hemodynamically stable.  Troponins were normal x2.  EKG showed no acute ST elevation or depression.  ER physician discussed with on-call cardiology who recommended patient be observed overnight and have a stress test tomorrow.  Review of Systems:  General: Denies fever, chills, weight loss, night sweats.  Denies dizziness.  Denies change in appetite HENT: Denies head trauma, headache, denies change in hearing, tinnitus.  Denies nasal congestion or bleeding.  Denies sore throat,  sores in mouth.  Denies difficulty swallowing Eyes: Denies blurry vision, pain in eye, drainage.  Denies discoloration of eyes. Neck: Denies pain.  Denies swelling.  Denies pain with movement. Cardiovascular: Reports chest pain. Denies palpitations.  Denies edema.  Denies orthopnea Respiratory: Reports shortness of breath. No cough.  Denies wheezing.  Denies sputum production Gastrointestinal: Reports hiccups. Denies abdominal pain, swelling.  Denies nausea, vomiting, diarrhea.  Denies melena.  Denies hematemesis. Musculoskeletal: Denies limitation of movement.  Denies deformity or swelling.  Denies pain.  Denies arthralgias or myalgias. Genitourinary: Denies pelvic pain.  Denies urinary frequency or hesitancy.  Denies dysuria.  Skin: Denies rash.  Denies petechiae, purpura, ecchymosis. Neurological: Denies headache.  Denies syncope. Denies seizure activity. Denies paresthesia. Denies slurred speech, drooping face.  Denies visual change. Psychiatric: Denies depression, anxiety.  Denies hallucinations.  Past Medical History:  Diagnosis Date  . Abnormal heart rhythm    frequent PVCs, Dr Ellyn Hack  . Allergies    previously was seen at [facility] office  . Anxiety    Wanda Plump  . Arthritis    KNEES, NECK, SHOULDERS, HANDS  . Asthma   . Biceps tendon tear    right  . Biceps tendon tear    bilateral arms   . CAD S/P percutaneous coronary angioplasty 10/11/2018   Cardiac Cath-PCI 06/10/2018: 90% mLAD --> DES PCI (ORSIRO 2.75X18) => 0%.  pRCA 25%. p-m Cx 40%. Ost OM1 50%, ost OM2  40% with 50% mid.  pLAD 30% & ost D1 75 % (med Rx).  Normal LVEDP.  Diagnostic       Intervention  Orsiro DES 2.75 x 18       . Cancer ([place])    skin cancer-  2 basal cell, 1 squamous cell  . Cervical spinal stenosis    with foramenal stenosis  . Chronic pain    Dr Clarnce Flock to tolerate opioids, antiseizure meds (keppra, topamax, lyrica) SNRI, tramadol not effective, Dr Nelva Bush  . Congenital absence of one kidney     has the right kidney  . Depression    Wanda Plump  . DVT (deep venous thrombosis) ([facility])    hx of w/ pulmonary embolus (4) on chronic anticoagulation  . Dysthymia    Wanda Plump  . Esophageal reflux    "w/wtricture s/p dilation, Dr Oletta Lamas" per notes from [place] at [facility]  . Gallstones 04/2014   asymptomatic on CT  . GERD (gastroesophageal reflux disease)   . Gout, arthritis    BILATERAL GREAT TOE-- PER PT STABLE  . Gynecomastia    8/17 mammogram benign  . H/O transfusion of packed red blood cells    as a child/ s/p tonsillectomy  . Headache    after CVA, improved on Topamax  . Hearing loss    hearing aids 2017, [facility] [facility]  . History of pulmonary embolism    Maintained on lifelong anticoagulation with Eliquis.  . Hypertension   . Hypogonadism in male    Dr Gilford Rile  . Lung nodule    BENIGN RIGHT UPPER--  CHEST CT 08-22-2011  . Moderate obstructive sleep apnea    (PSG 09/26/08 AHI 5/hr REM 25/hr, RDI 28/hr REM 37/hr, 02 nadir 88%; CPAP titration recommended; HSAT 02/03/18 ESS 9, AHI 21/hr, O2 min 83%), Dr Maxwell Caul, has not worn since 10/19, sleeps better without it (per [facility] notes).  . Obesity   . OSA (obstructive sleep apnea)    NO CPAP -states dr said possibly related to asthma  . Paresthesias    right foot  . S/P dilatation of esophageal stricture   . Stroke [facility])    3 small lacunar infarcts, parietal lobe, 8/15--Dr Tomi Likens, R retinal artery occlusion 8/15, lost almost 50% of vision R eye  . Torn rotator cuff    Right x 3  . Tubular adenoma 01/2009   polyps, Dr Oletta Lamas    Past Surgical History:  Procedure Laterality Date  . CARPAL TUNNEL RELEASE Right 2010  . COLON SURGERY     x3  . CORONARY CALCIUM SCORING WITH CT CORONARY ANGIOGRAM  05/29/2018   Coronary calcium score 416.  Possible obstructive CAD in the mid circumflex-OM1-OM 2, D2 and a moderate disease in the proximal LAD -->  FFR --> LAD: Proximal.90, mid.86  distal.68 D2.80, Cx: Mid.76 OM2.74: Hemodynamically significant distal LAD, D2, mid circumflex and OM2 lesions  . CORONARY STENT INTERVENTION N/A 06/10/2018   Procedure: CORONARY STENT INTERVENTION;  Surgeon: Jettie Booze, MD;  Location: [place] INVASIVE CV LAB;;  90% mLAD --> DES PCI (ORSIRO 2.75X18) => 0%  . JOINT REPLACEMENT    . LEFT HEART CATH AND CORONARY ANGIOGRAPHY N/A 06/10/2018   Procedure: LEFT HEART CATH AND CORONARY ANGIOGRAPHY;  Surgeon: Jettie Booze, MD;  Location: [facility] INVASIVE CV LAB;;  90% mLAD --> DES PCI.  pRCA 25%. p-m Cx 40%. Ost OM1 50%, ost OM2 40% with 50% mid.  pLAD 30% & ost D1 75 % (  med Rx).  Normal LVEDP.   [name] NEPHRECTOMY    . SHOULDER ARTHROSCOPY Right   . SHOULDER ARTHROSCOPY Left 2021  . TONSILLECTOMY  AGE 89  . TOTAL KNEE ARTHROPLASTY Left 10-15-2008  . TOTAL KNEE ARTHROPLASTY Right 01/18/2014   Procedure: RIGHT TOTAL KNEE ARTHROPLASTY;  Surgeon: Gearlean Alf, MD;  Location: [facility] ORS;  Service: Orthopedics;  Laterality: Right;  . TRANSTHORACIC ECHOCARDIOGRAM  01/2018    Normal LV size with moderate LVH.  EF 60 to 65% with GR 1 DD (normal for age)  . WRIST GANGLION EXCISION Right 1964    Social History  reports that he quit smoking about 40 years ago. His smoking use included cigarettes. He has a 25.00 pack-year smoking history. He has never used smokeless tobacco. He reports current alcohol use. He reports that he does not use drugs.  Allergies  Allergen Reactions  . Garlic Anaphylaxis  . Onion Anaphylaxis  . Amlodipine   . Budesonide-Formoterol Fumarate     Seemed to make breathing worse  . Buprenorphine Nausea Only  . Buspar [Buspirone]   . Celecoxib   . Codeine Other (See Comments)    "PASSES OUT"  . Duloxetine   . Fluticasone   . Fluticasone-Salmeterol   . Glucosamine Forte [Nutritional Supplements]   . Hydrocodone     Pt states that he is allergic to synthetic hydrocodone.  . Ibuprofen     Kidney problems  . Lisinopril     Headaches,  nausea, cough  . Mometasone     ineffective  . Novocain [Procaine] Nausea Only  . Sildenafil   . Topiramate Nausea Only  . Trazodone And Nefazodone     Dizzy/off balance    Family History  Problem Relation Age of Onset  . Deep vein thrombosis Father   . Stroke Father   . Stroke Brother   . Colon cancer Mother   . Migraines Neg Hx   . Pancreatic cancer Neg Hx   . Stomach cancer Neg Hx      Prior to Admission medications   Medication Sig Start Date End Date Taking? Authorizing Provider  acetaminophen (TYLENOL) 500 MG tablet Take 1,000 mg by mouth every 6 (six) hours as needed.   Yes [provider]  allopurinol (ZYLOPRIM) 300 MG tablet Take 300 mg by mouth daily.   Yes [provider]  amiodarone (PACERONE) 100 MG tablet Take 1 tablet (100 mg total) by mouth daily. 12/23/20  Yes Leonie Man, MD  apixaban (ELIQUIS) 5 MG TABS tablet Take 5 mg by mouth 2 (two) times daily.   Yes [provider]  baclofen (LIORESAL) 10 MG tablet Take 10 mg by mouth 2 (two) times daily. 03/07/21  Yes [provider]  clonazePAM (KLONOPIN) 0.5 MG tablet Take 0.5 mg by mouth daily. 11/09/20  Yes [provider]  fluticasone (FLONASE) 50 MCG/ACT nasal spray Place 1 spray into both nostrils daily.   Yes [provider]  Lactobacillus (PROBIOTIC ACIDOPHILUS PO) Take 1 tablet by mouth daily.   Yes [provider]  lansoprazole (PREVACID) 15 MG capsule Take 15 mg by mouth daily at 12 noon.   Yes [provider]  Melatonin 10 MG TABS Take 10 mg by mouth daily.   Yes [provider]  mirtazapine (REMERON) 7.5 MG tablet Take 7.5 mg by mouth at bedtime. 10/18/20  Yes [provider]  Multiple Vitamin (MULTIVITAMIN WITH MINERALS) TABS tablet Take 1 tablet by mouth daily.   Yes [provider]  nitroGLYCERIN (NITROSTAT) 0.4 MG SL tablet Place 1 tablet (0.4 mg total) under the tongue every 5 (five) minutes as needed for  chest pain. 06/12/18  Yes Leonie Man, MD  ondansetron (ZOFRAN) 4 MG tablet Take 1 tablet (4 mg total) by mouth every 8 (eight) hours as needed for nausea or vomiting. 03/03/20  Yes Nicholes Stairs, MD  PARoxetine (PAXIL) 20 MG tablet Take 10 mg by mouth daily. 11/09/20  Yes [provider]  rosuvastatin (CRESTOR) 20 MG tablet Take 0.5 tablets (10 mg total) by mouth daily. 01/03/21  Yes Leonie Man, MD  testosterone cypionate (DEPOTESTOSTERONE CYPIONATE) 200 MG/ML injection Inject 100 mg into the muscle once a week. 1 ml weekly 01/14/20  Yes [provider]  traMADol-acetaminophen (ULTRACET) 37.5-325 MG tablet Take 2 tablets by mouth every 8 (eight) hours as needed for moderate pain.   Yes [provider]  valsartan (DIOVAN) 160 MG tablet Take 1 tablet (160 mg total) by mouth daily. 09/15/20  Yes Leonie Man, MD  methocarbamol (ROBAXIN) 750 MG tablet Take 750 mg by mouth 3 (three) times daily.    [provider]    Physical Exam: Vitals:   03/08/21 2341 03/08/21 2345 03/09/21 0000 03/09/21 0015  BP: 137/75 127/67 130/73 (!) 148/84  Pulse: 91 87 92 91  Resp: 20 13 14 14   Temp:      TempSrc:      SpO2: 95% 95% 92% 93%  Weight:      Height:        Constitutional: NAD, calm, comfortable Vitals:   03/08/21 2341 03/08/21 2345 03/09/21 0000 03/09/21 0015  BP: 137/75 127/67 130/73 (!) 148/84  Pulse: 91 87 92 91  Resp: 20 13 14 14   Temp:      TempSrc:      SpO2: 95% 95% 92% 93%  Weight:      Height:       General: WDWN, Alert and oriented x3.  Eyes: EOMI, PERRL, conjunctivae normal.  Sclera nonicteric HENT:  [place]/AT, external ears normal.  Nares patent without epistasis.  Mucous membranes are moist. Neck: Soft, normal range of motion, supple, no masses, no thyromegaly.  Trachea midline Respiratory: clear to auscultation bilaterally, no wheezing, no crackles. Normal respiratory effort. No accessory muscle use.  Cardiovascular: Regular rate  and rhythm, no murmurs / rubs / gallops. No extremity edema. 2+ pedal pulses.  Abdomen: Soft, no tenderness, nondistended, no rebound or guarding. Obese. No masses palpated. Bowel sounds normoactive Musculoskeletal: FROM. no cyanosis. No joint deformity upper and lower extremities. Normal muscle tone.  Skin: Warm, dry, intact no rashes, lesions, ulcers. No induration Neurologic: CN 2-12 grossly intact.  Normal speech.  Sensation intact, Strength 5/5 in all extremities.   Psychiatric: Normal judgment and insight.  Normal mood.    Labs on Admission: I have personally reviewed following labs and imaging studies  CBC: Recent Labs  Lab 03/08/21 1746  WBC 10.8*  NEUTROABS 9.6*  HGB 16.9  HCT 49.6  MCV 97.8  PLT [id number]    Basic Metabolic Panel: Recent Labs  Lab 03/08/21 1746  NA 143  K 4.2  CL 107  CO2 27  GLUCOSE 136*  BUN 19  CREATININE 1.35*  CALCIUM 8.9    GFR: Estimated Creatinine Clearance: 57.4 mL/min (A) (by C-G formula based on SCr of 1.35 mg/dL (H)).  Liver Function Tests: Recent Labs  Lab 03/08/21 1746  AST 27  ALT 25  ALKPHOS 49  BILITOT  0.8  PROT 7.4  ALBUMIN 4.3    Urine analysis:    Component Value Date/Time   COLORURINE YELLOW 01/13/2014 0915   APPEARANCEUR CLEAR 01/13/2014 0915   LABSPEC 1.007 01/13/2014 0915   PHURINE 5.5 01/13/2014 0915   GLUCOSEU NEGATIVE 01/13/2014 0915   HGBUR NEGATIVE 01/13/2014 0915   BILIRUBINUR NEGATIVE 01/13/2014 0915   KETONESUR NEGATIVE 01/13/2014 0915   PROTEINUR NEGATIVE 01/13/2014 0915   UROBILINOGEN 0.2 01/13/2014 0915   NITRITE NEGATIVE 01/13/2014 0915   LEUKOCYTESUR NEGATIVE 01/13/2014 0915    Radiological Exams on Admission: DG Chest 2 View  Result Date: 03/08/2021 CLINICAL DATA:  Chest pain, dyspnea EXAM: CHEST - 2 VIEW COMPARISON:  07/12/2020 FINDINGS: Lungs are well expanded, symmetric, and clear. No pneumothorax or pleural effusion. Cardiac size within normal limits. Pulmonary vascularity is normal.  Osseous structures are age-appropriate. No acute bone abnormality. IMPRESSION: No active cardiopulmonary disease. Electronically Signed   By: Fidela Salisbury MD   On: 03/08/2021 18:26   CT Angio Chest PE W and/or Wo Contrast  Result Date: 03/08/2021 CLINICAL DATA:  Chest pain, shortness of breath EXAM: CT ANGIOGRAPHY CHEST WITH CONTRAST TECHNIQUE: Multidetector CT imaging of the chest was performed using the standard protocol during bolus administration of intravenous contrast. Multiplanar CT image reconstructions and MIPs were obtained to evaluate the vascular anatomy. CONTRAST:  130mL OMNIPAQUE IOHEXOL 350 MG/ML SOLN COMPARISON:  05/29/2018, 03/08/2021 FINDINGS: Cardiovascular: This is a technically adequate evaluation of the pulmonary vasculature. No filling defects or pulmonary emboli. The heart is unremarkable without pericardial effusion. No evidence of thoracic aortic aneurysm or dissection. Mild atherosclerosis of the aorta and coronary vasculature. Mediastinum/Nodes: No enlarged mediastinal, hilar, or axillary lymph nodes. Thyroid gland, trachea, and esophagus demonstrate no significant findings. Lungs/Pleura: No acute airspace disease, effusion, or pneumothorax. The central airways are patent. Upper Abdomen: Calcified gallstones are identified without cholecystitis. The remainder of the upper abdomen is unremarkable. Musculoskeletal: There are no acute or destructive bony lesions. Reconstructed images demonstrate no additional findings. Review of the MIP images confirms the above findings. IMPRESSION: 1. No evidence of pulmonary embolus. 2. No acute intrathoracic process. 3. Cholelithiasis without cholecystitis. 4. Aortic Atherosclerosis (ICD10-I70.0). Coronary artery atherosclerosis. Electronically Signed   By: Randa Ngo M.D.   On: 03/08/2021 21:30    EKG: Independently reviewed.  EKG shows sinus tachycardia with occasional PVCs.  No acute ST elevation or  depression.  Assessment/Plan Principal Problem:   Chest pain Mr. Behrens will be placed on cardiac telemetry for observation for chest pain.  Obtain serial troponin levels.  If troponins remain negative we will proceed with stress test.  Cardiology was consulted by the ER physician and will see patient in the morning.  Patient is anticoagulated on Eliquis which will be continued.  Check lipid panel.  Continue statin therapy.  Monitor blood pressure.  Nitroglycerin as needed.  Pulmonal oxygen as needed to maintain O2 sat between 92-96% Continue home medications of amiodarone, Crestor  Active Problems:   Essential hypertension Continue ARB therapy and metoprolol.  Monitor blood pressure    CAD S/P percutaneous coronary angioplasty Chronic.  Continue home meds    History of pulmonary embolism Patient is on Eliquis which will be continued    Hiccups Patient was given Valium in the emergency room which is following patient to rest and calming down the hiccups    DVT prophylaxis: Patient is anticoagulated on Eliquis which will be continued Code Status:   Full code Family Communication:  Diagnosis and plan  discussed with patient.  Patient verbalized understanding agrees with plan.  Further recommendations to follow as clinical indicated Disposition Plan:   Patient is from:  Home  Anticipated DC to:  Home  Anticipated DC date:  Anticipate less than 2 midnight stay  Anticipated DC barriers: No barriers to discharge identified at this time  Consults called:  Cardiology consulted by ER physician and will see in am Admission status:  Observation  Eben Burow MD [facility]  How to contact the [facility] Attending or Consulting provider [place] or covering provider during after hours [place], for this patient?   1. Check the care team in [facility] and look for a) attending/consulting [facility] provider listed and b) the [facility] team listed 2. Log into [email or web address] and use 's universal  password to access. If you do not have the password, please contact the hospital operator. 3. Locate the [facility] provider you are looking for under [facility] and page to a number that you can be directly reached. 4. If you still have difficulty reaching the provider, please page the [facility] (Director on Call) for the Hospitalists listed on amion for assistance.  03/09/2021, 1:13 AM

## 2021-03-09 NOTE — ED Notes (Signed)
Called cardiology for pt- pt was requesting update, cardiology stated pt was not on their list, unsure which MD was contacted yesterday, but they stated they would add him to the list and have someone come see him as soon as they could, will update pt

## 2021-03-09 NOTE — Progress Notes (Signed)
Assumed care of pt, bedside shift report received from [place], Rn. Pt sitting up in bed alert and oriented. Continues to have hiccups but no other complaints. Bed alarm is on and call bell within reach.

## 2021-03-09 NOTE — Consult Note (Signed)
Cardiology Consultation:   Patient ID: [name] MRN: [id number]; DOB: 1941/09/08  Admit date: 03/08/2021 Date of Consult: 03/09/2021  Primary Care Provider: Harlan Stains, MD [facility] Cardiologist: Glenetta Hew, MD  [facility] Electrophysiologist:  None    Patient Profile:   [name] is a 80 y.o. male with a hx of ASCAD s/p PCI Of dLAD, NSVT, HTN, HLD, chronic atypical angina class 2-3, HLD, hx of PE on chronic anticoagulation who is being seen today for the evaluation of chest pain at the request of Maruicio Arrien, MD.  History of Present Illness:   [name]  is a 80 y.o. male with a hx of ASCAD s/p PCI Of dLAD, NSVT, HTN, HLD, chronic atypical angina class 2-3, HLD, hx of PE on chronic anticoagulation.  His last cath was in Aug 2019 which showed 25% pRCA, 50% pOM1 and pOM2, 40% mid LCx, 30% pLAD,  75% pD1, 90% dLAD and underwent PCI of dLAD.  He had a nuclear stress test in 09/2020 that showed a prominent fixed inferior defect that was felt to represent diaphragmatic attenuation given normal LVF.  He has chronic atypical angina which has been stable with no further CP after his PCI.  He is no longer on ASA or Plavix as he is on Eliquis for chronic PE.  He has a hx of NSVT which has been stable on Amio but was decreased to 100mg  daily due to reduced exercise tolerance.  He has a hx of HLD, congenital absence of 1 kidney, GERD s/p dilatation, CVA and OSA.  He was in his USOH until yesterday when he developed substernal CP that was a heaviness that did not radiate but had SOB and diaphoresis with the pain and came to the ER for evaluation.  He apparently started having hiccups a week ago after taking some opiate medication from his PCP.  The hiccups are worse with th chest pain.  He denies any nausea or vomiting.  Pain rated 8/10 and resolved in the ER.  Pain was worse with exertion and improved at rest when at home.  hsTrop normal x 3 and EKG showed NSR with PVCs  and no ST changes. SCr elevated at 1.35 and LFTs normal.  LDLK was 49.  Cardiology is now asked to consult.    Past Medical History:  Diagnosis Date  . Abnormal heart rhythm    frequent PVCs, Dr Ellyn Hack  . Allergies    previously was seen at [facility] office  . Anxiety    Wanda Plump  . Arthritis    KNEES, NECK, SHOULDERS, HANDS  . Asthma   . Biceps tendon tear    right  . Biceps tendon tear    bilateral arms   . CAD S/P percutaneous coronary angioplasty 10/11/2018   Cardiac Cath-PCI 06/10/2018: 90% mLAD --> DES PCI (ORSIRO 2.75X18) => 0%.  pRCA 25%. p-m Cx 40%. Ost OM1 50%, ost OM2 40% with 50% mid.  pLAD 30% & ost D1 75 % (med Rx).  Normal LVEDP.  Diagnostic       Intervention  Orsiro DES 2.75 x 18       . Cancer ([place])    skin cancer-  2 basal cell, 1 squamous cell  . Cervical spinal stenosis    with foramenal stenosis  . Chronic pain    Dr Clarnce Flock to tolerate opioids, antiseizure meds (keppra, topamax, lyrica) SNRI, tramadol not effective, Dr Nelva Bush  . Congenital absence of one kidney    has  the right kidney  . Depression    Wanda Plump  . DVT (deep venous thrombosis) (HCC)    hx of w/ pulmonary embolus (4) on chronic anticoagulation  . Dysthymia    Wanda Plump  . Esophageal reflux    "w/wtricture s/p dilation, Dr Oletta Lamas" per notes from [facility] at [place]  . Gallstones 04/2014   asymptomatic on CT  . GERD (gastroesophageal reflux disease)   . Gout, arthritis    BILATERAL GREAT TOE-- PER PT STABLE  . Gynecomastia    8/17 mammogram benign  . H/O transfusion of packed red blood cells    as a child/ s/p tonsillectomy  . Headache    after CVA, improved on Topamax  . Hearing loss    hearing aids 2017, [facility]  . History of pulmonary embolism    Maintained on lifelong anticoagulation with Eliquis.  . Hypertension   . Hypogonadism in male    Dr Gilford Rile  . Lung nodule    BENIGN RIGHT UPPER--  CHEST CT 08-22-2011  . Moderate obstructive  sleep apnea    (PSG 09/26/08 AHI 5/hr REM 25/hr, RDI 28/hr REM 37/hr, 02 nadir 88%; CPAP titration recommended; HSAT 02/03/18 ESS 9, AHI 21/hr, O2 min 83%), Dr Maxwell Caul, has not worn since 10/19, sleeps better without it (per [facility] notes).  . Obesity   . OSA (obstructive sleep apnea)    NO CPAP -states dr said possibly related to asthma  . Paresthesias    right foot  . S/P dilatation of esophageal stricture   . Stroke [facility])    3 small lacunar infarcts, parietal lobe, 8/15--Dr Tomi Likens, R retinal artery occlusion 8/15, lost almost 50% of vision R eye  . Torn rotator cuff    Right x 3  . Tubular adenoma 01/2009   polyps, Dr Oletta Lamas    Past Surgical History:  Procedure Laterality Date  . CARPAL TUNNEL RELEASE Right 2010  . COLON SURGERY     x3  . CORONARY CALCIUM SCORING WITH CT CORONARY ANGIOGRAM  05/29/2018   Coronary calcium score 416.  Possible obstructive CAD in the mid circumflex-OM1-OM 2, D2 and a moderate disease in the proximal LAD -->  FFR --> LAD: Proximal.90, mid.86 distal.68 D2.80, Cx: Mid.76 OM2.74: Hemodynamically significant distal LAD, D2, mid circumflex and OM2 lesions  . CORONARY STENT INTERVENTION N/A 06/10/2018   Procedure: CORONARY STENT INTERVENTION;  Surgeon: Jettie Booze, MD;  Location: [facility]AS[facility] CV LAB;;  90% mLAD --> DES PCI (ORSIRO 2.75X18) => 0%  . JOINT REPLACEMENT    . LEFT HEART CATH AND CORONARY ANGIOGRAPHY N/A 06/10/2018   Procedure: LEFT HEART CATH AND CORONARY ANGIOGRAPHY;  Surgeon: Jettie Booze, MD;  Location: [facility]AS[facility] CV LAB;;  90% mLAD --> DES PCI.  pRCA 25%. p-m Cx 40%. Ost OM1 50%, ost OM2 40% with 50% mid.  pLAD 30% & ost D1 75 % (med Rx).  Normal LVEDP.   [name] NEPHRECTOMY    . SHOULDER ARTHROSCOPY Right   . SHOULDER ARTHROSCOPY Left 2021  . TONSILLECTOMY  AGE 58  . TOTAL KNEE ARTHROPLASTY Left 10-15-2008  . TOTAL KNEE ARTHROPLASTY Right 01/18/2014   Procedure: RIGHT TOTAL KNEE ARTHROPLASTY;  Surgeon: Gearlean Alf, MD;  Location: [facility] OR[facility];  Service: Orthopedics;  Laterality: Right;  . TRANSTHORACIC ECHOCARDIOGRAM  01/2018    Normal LV size with moderate LVH.  EF 60 to 65% with GR 1 DD (normal for age)  . WRIST GANGLION  EXCISION Right 1964     Home Medications:  Prior to Admission medications   Medication Sig Start Date End Date Taking? Authorizing Provider  acetaminophen (TYLENOL) 500 MG tablet Take 1,000 mg by mouth every 6 (six) hours as needed.   Yes [provider]  allopurinol (ZYLOPRIM) 300 MG tablet Take 300 mg by mouth daily.   Yes [provider]  amiodarone (PACERONE) 100 MG tablet Take 1 tablet (100 mg total) by mouth daily. 12/23/20  Yes Leonie Man, MD  apixaban (ELIQUIS) 5 MG TABS tablet Take 5 mg by mouth 2 (two) times daily.   Yes [provider]  baclofen (LIORESAL) 10 MG tablet Take 10 mg by mouth 2 (two) times daily. 03/07/21  Yes [provider]  clonazePAM (KLONOPIN) 0.5 MG tablet Take 0.5 mg by mouth daily. 11/09/20  Yes [provider]  fluticasone (FLONASE) 50 MCG/ACT nasal spray Place 1 spray into both nostrils daily.   Yes [provider]  Lactobacillus (PROBIOTIC ACIDOPHILUS PO) Take 1 tablet by mouth daily.   Yes [provider]  lansoprazole (PREVACID) 15 MG capsule Take 15 mg by mouth daily at 12 noon.   Yes [provider]  Melatonin 10 MG TABS Take 10 mg by mouth daily.   Yes [provider]  mirtazapine (REMERON) 7.5 MG tablet Take 7.5 mg by mouth at bedtime. 10/18/20  Yes [provider]  Multiple Vitamin (MULTIVITAMIN WITH MINERALS) TABS tablet Take 1 tablet by mouth daily.   Yes [provider]  nitroGLYCERIN (NITROSTAT) 0.4 MG SL tablet Place 1 tablet (0.4 mg total) under the tongue every 5 (five) minutes as needed for chest pain. 06/12/18  Yes Leonie Man, MD  ondansetron (ZOFRAN) 4 MG tablet Take 1 tablet (4 mg total) by mouth every 8 (eight) hours as needed  for nausea or vomiting. 03/03/20  Yes Nicholes Stairs, MD  PARoxetine (PAXIL) 20 MG tablet Take 10 mg by mouth daily. 11/09/20  Yes [provider]  rosuvastatin (CRESTOR) 20 MG tablet Take 0.5 tablets (10 mg total) by mouth daily. 01/03/21  Yes Leonie Man, MD  testosterone cypionate (DEPOTESTOSTERONE CYPIONATE) 200 MG/ML injection Inject 100 mg into the muscle once a week. 1 ml weekly 01/14/20  Yes [provider]  traMADol-acetaminophen (ULTRACET) 37.5-325 MG tablet Take 2 tablets by mouth every 8 (eight) hours as needed for moderate pain.   Yes [provider]  valsartan (DIOVAN) 160 MG tablet Take 1 tablet (160 mg total) by mouth daily. 09/15/20  Yes Leonie Man, MD  methocarbamol (ROBAXIN) 750 MG tablet Take 750 mg by mouth 3 (three) times daily.    [provider]    Inpatient Medications: Scheduled Meds: . allopurinol  300 mg Oral Daily  . amiodarone  100 mg Oral Daily  . apixaban  5 mg Oral BID  . clonazePAM  0.5 mg Oral BID  . irbesartan  150 mg Oral Daily  . melatonin  10 mg Oral QHS  . mirtazapine  7.5 mg Oral QHS  . pantoprazole  20 mg Oral Daily  . PARoxetine  10 mg Oral Daily  . PARoxetine  20 mg Oral QHS  . rosuvastatin  10 mg Oral Daily   Continuous Infusions:  PRN Meds: acetaminophen, nitroGLYCERIN, ondansetron (ZOFRAN) IV  Allergies:    Allergies  Allergen Reactions  . Garlic Anaphylaxis  . Onion Anaphylaxis  . Amlodipine   . Budesonide-Formoterol Fumarate     Seemed to make  breathing worse  . Buprenorphine Nausea Only  . Buspar [Buspirone]   . Celecoxib   . Codeine Other (See Comments)    "PASSES OUT"  . Duloxetine   . Fluticasone   . Fluticasone-Salmeterol   . Glucosamine Forte [Nutritional Supplements]   . Hydrocodone     Pt states that he is allergic to synthetic hydrocodone.  . Ibuprofen     Kidney problems  . Lisinopril     Headaches, nausea, cough  . Mometasone     ineffective  . Novocain  [Procaine] Nausea Only  . Sildenafil   . Topiramate Nausea Only  . Trazodone And Nefazodone     Dizzy/off balance    Social History:   Social History   Socioeconomic History  . Marital status: Married    Spouse name: Not on file  . Number of children: 4  . Years of education: some master's classes  . Highest education level: Bachelor's degree (e.g., BA, AB, BS)  Occupational History  . Occupation: CNA  Tobacco Use  . Smoking status: Former Smoker    Packs/day: 1.00    Years: 25.00    Pack years: 25.00    Types: Cigarettes    Quit date: 07/13/1980    Years since quitting: 40.6  . Smokeless tobacco: Never Used  Vaping Use  . Vaping Use: Never used  Substance and Sexual Activity  . Alcohol use: Yes    Comment: RARE  . Drug use: No  . Sexual activity: Yes    Partners: Female  Other Topics Concern  . Not on file  Social History Narrative   Lives at home with his wife & youngest daughter and her family   Right handed   Drinks 2 cups of coffee daily, occasional cup of tea   Social Determinants of Health   Financial Resource Strain: Not on file  Food Insecurity: Not on file  Transportation Needs: Not on file  Physical Activity: Not on file  Stress: Not on file  Social Connections: Not on file  Intimate Partner Violence: Not on file    Family History:    Family History  Problem Relation Age of Onset  . Deep vein thrombosis Father   . Stroke Father   . Stroke Brother   . Colon cancer Mother   . Migraines Neg Hx   . Pancreatic cancer Neg Hx   . Stomach cancer Neg Hx      ROS:  Please see the history of present illness.   All other ROS reviewed and negative.     Physical Exam/Data:   Vitals:   03/09/21 0515 03/09/21 0530 03/09/21 0904 03/09/21 1025  BP: (!) 141/85 (!) 139/98 (!) 151/75 (!) 163/71  Pulse: 71 84 89 94  Resp: 13 12 18 17   Temp:   97.7 F (36.5 C)   TempSrc:   Oral   SpO2: 90% 95% 94% 98%  Weight:      Height:       No intake or  output data in the 24 hours ending 03/09/21 1123 Last 3 Weights 03/08/2021 12/23/2020 12/19/2020  Weight (lbs) 232 lb 230 lb 237 lb  Weight (kg) 105.235 kg 104.327 kg 107.502 kg     Body mass index is 31.46 kg/m.  General:  Well nourished, well developed, in no acute distress HEENT: normal Lymph: no adenopathy Neck: no JVD Endocrine:  No thryomegaly Vascular: No carotid bruits; FA pulses 2+ bilaterally without bruits  Cardiac:  normal S1, S2; RRR; no murmur  Lungs:  clear to auscultation bilaterally, no wheezing, rhonchi or rales  Abd: soft, nontender, no hepatomegaly  Ext: no edema Musculoskeletal:  No deformities, BUE and BLE strength normal and equal Skin: warm and dry  Neuro:  CNs 2-12 intact, no focal abnormalities noted Psych:  Normal affect   EKG:  The EKG was personally reviewed and demonstrates:  NSR with PVC and no ST changes Telemetry:  Telemetry was personally reviewed and demonstrates:  NSR with PVCs  Relevant CV Studies: 2D echo 08/2020 IMPRESSIONS   1. Left ventricular ejection fraction, by estimation, is 60 to 65%. The  left ventricle has normal function. The left ventricle has no regional  wall motion abnormalities. There is severe concentric left ventricular  hypertrophy. Left ventricular diastolic  parameters are consistent with Grade I diastolic dysfunction (impaired  relaxation).  2. Right ventricular systolic function is normal. The right ventricular  size is normal.  3. The mitral valve is grossly normal. Mild mitral valve regurgitation.  No evidence of mitral stenosis.  4. The aortic valve is grossly normal. Aortic valve regurgitation is not  visualized. No aortic stenosis is present.   Stress Myoview 09/2020 Study Highlights    The left ventricular ejection fraction is mildly decreased (45-54%).  Nuclear stress EF: 49%.  Blood pressure demonstrated a normal response to exercise.  There was no ST segment deviation noted during  stress.  Defect 1: There is a medium defect of severe severity present in the basal inferior and mid inferior location.  This is a low risk study.   Abnormal, low risk stress nuclear study with prior inferior infarct versus prominent diaphragmatic attenuation.  No ischemia.  Gated ejection fraction 49% with normal wall motion (LV function appears better than calculated ejection fraction; can consider echocardiogram to further assess if indicated).  Cardiac Cath 06/2018 Conclusion    Prox RCA lesion is 25% stenosed.  Ost 1st Mrg lesion is 50% stenosed.  Prox Cx to Mid Cx lesion is 40% stenosed.  Ost 2nd Mrg lesion is 40% stenosed.  2nd Mrg lesion is 50% stenosed.  Prox LAD lesion is 30% stenosed.  Ost 1st Diag lesion is 75% stenosed.  Mid LAD lesion is 90% stenosed.  A drug-eluting stent was successfully placed using a STENT ORSIRO 2.75X18.  Post intervention, there is a 0% residual stenosis.  LV end diastolic pressure is normal.  There is no aortic valve stenosis.   Recommend to resume Apixaban, at currently prescribed dose and frequency, on 06/11/18.  Recommend concurrent antiplatelet therapy of Aspirin 81mg  daily for 1 month and Clopidogrel 75mg  daily for 6 months.   Diagnostic Dominance: Right    Intervention       Laboratory Data:  High Sensitivity Troponin:   Recent Labs  Lab 03/08/21 1746 03/08/21 2035 03/09/21 0856  TROPONINIHS 8 6 9      Chemistry Recent Labs  Lab 03/08/21 1746  NA 143  K 4.2  CL 107  CO2 27  GLUCOSE 136*  BUN 19  CREATININE 1.35*  CALCIUM 8.9  GFRNONAA 54*  ANIONGAP 9    Recent Labs  Lab 03/08/21 1746  PROT 7.4  ALBUMIN 4.3  AST 27  ALT 25  ALKPHOS 49  BILITOT 0.8   Hematology Recent Labs  Lab 03/08/21 1746  WBC 10.8*  RBC 5.07  HGB 16.9  HCT 49.6  MCV 97.8  MCH 33.3  MCHC 34.1  RDW 13.5  PLT 221   BNPNo results for input(s): BNP, PROBNP in the last  168 hours.  DDimer No results for input(s):  DDIMER in the last 168 hours.   Radiology/Studies:  DG Chest 2 View  Result Date: 03/08/2021 CLINICAL DATA:  Chest pain, dyspnea EXAM: CHEST - 2 VIEW COMPARISON:  07/12/2020 FINDINGS: Lungs are well expanded, symmetric, and clear. No pneumothorax or pleural effusion. Cardiac size within normal limits. Pulmonary vascularity is normal. Osseous structures are age-appropriate. No acute bone abnormality. IMPRESSION: No active cardiopulmonary disease. Electronically Signed   By: Fidela Salisbury MD   On: 03/08/2021 18:26   CT Angio Chest PE W and/or Wo Contrast  Result Date: 03/08/2021 CLINICAL DATA:  Chest pain, shortness of breath EXAM: CT ANGIOGRAPHY CHEST WITH CONTRAST TECHNIQUE: Multidetector CT imaging of the chest was performed using the standard protocol during bolus administration of intravenous contrast. Multiplanar CT image reconstructions and MIPs were obtained to evaluate the vascular anatomy. CONTRAST:  155mL OMNIPAQUE IOHEXOL 350 MG/ML SOLN COMPARISON:  05/29/2018, 03/08/2021 FINDINGS: Cardiovascular: This is a technically adequate evaluation of the pulmonary vasculature. No filling defects or pulmonary emboli. The heart is unremarkable without pericardial effusion. No evidence of thoracic aortic aneurysm or dissection. Mild atherosclerosis of the aorta and coronary vasculature. Mediastinum/Nodes: No enlarged mediastinal, hilar, or axillary lymph nodes. Thyroid gland, trachea, and esophagus demonstrate no significant findings. Lungs/Pleura: No acute airspace disease, effusion, or pneumothorax. The central airways are patent. Upper Abdomen: Calcified gallstones are identified without cholecystitis. The remainder of the upper abdomen is unremarkable. Musculoskeletal: There are no acute or destructive bony lesions. Reconstructed images demonstrate no additional findings. Review of the MIP images confirms the above findings. IMPRESSION: 1. No evidence of pulmonary embolus. 2. No acute intrathoracic  process. 3. Cholelithiasis without cholecystitis. 4. Aortic Atherosclerosis (ICD10-I70.0). Coronary artery atherosclerosis. Electronically Signed   By: Randa Ngo M.D.   On: 03/08/2021 21:30     Assessment and Plan:   1. Chest pain -symptoms are very atypical >>he has had hiccups constantly for over a week and now is having a vague discomfort in the epigastric area that had been worse with the hiccups. He says it feels like someone punched him in the epigastric area.  He has a hx of hiatal hernia.  The discomfort only lasts a few seconds and then is gone.  -pain associated with hiccups and he has a hx of GERD and esophageal dilatation in the past>>never had hiccups when his CAD was initially dx with PCI -he says that he never had any chest pain with his prior CAD and it was found during workup for frequent PVCs -nuclear stress test showed a fixed inferior wall defect that was felt to be attenuation artifact in 09/2020>>this was done for increased PVCs -hsTrop neg x 3 and EKG normal with no acute ST changes -his last cath showed mild to moderate residual disease s/p PCI of dLAD -I do not think his epigastric pain is related to coronary ischemia -he has had bad hiccups for days now and wonder if the discomfort is related to ongoing hiccups and GI etiology -at this time would not pursue cath.  He never had any pain with his CAD in the past.   -recommend GI consult  2.  ASCAD -cath in Aug 2019 showed 25% pRCA, 50% pOM1 and pOM2, 40% mid LCx, 30% pLAD,  75% pD1, 90% dLAD and underwent PCI of dLAD. -per Dr. Allison Quarry last note, he has chronic atypical stable angina Class 2-3 but he tells me he never had CP and only found out he had CAD  because he was having frequent PVCs -no ASA or Plavix due to Eliquis -add lopressor 12.5mg  BID  3.  HLD -LDL goal < 70 -LDL was 49 this admit -continue Crestor 10mg  daily  4.  NSVT  -no VT on tele today -will continue home dose of Amio 100mg  daily  5.   HTN -BP elevated on exam today -continue Irbesartan 150mg  daily -SCr 1.35 this am down from 1.44 last fall -adding Lopressor 12.5mg  BID  6.  Hiccups -? Etiology -needs GI eval [id number]}   HEAR Score (for undifferentiated chest pain):  HEAR Score: 4 For questions or updates, please contact [place] Please consult [email or web address] for contact info under    Signed, Fransico Him, MD  03/09/2021 11:23 AM

## 2021-03-10 ENCOUNTER — Observation Stay (HOSPITAL_COMMUNITY): Payer: Medicare Other

## 2021-03-10 DIAGNOSIS — I2510 Atherosclerotic heart disease of native coronary artery without angina pectoris: Secondary | ICD-10-CM | POA: Diagnosis not present

## 2021-03-10 DIAGNOSIS — I340 Nonrheumatic mitral (valve) insufficiency: Secondary | ICD-10-CM | POA: Diagnosis not present

## 2021-03-10 DIAGNOSIS — I472 Ventricular tachycardia: Secondary | ICD-10-CM

## 2021-03-10 DIAGNOSIS — Z8719 Personal history of other diseases of the digestive system: Secondary | ICD-10-CM

## 2021-03-10 DIAGNOSIS — R079 Chest pain, unspecified: Secondary | ICD-10-CM

## 2021-03-10 DIAGNOSIS — I10 Essential (primary) hypertension: Secondary | ICD-10-CM | POA: Diagnosis not present

## 2021-03-10 DIAGNOSIS — R0789 Other chest pain: Secondary | ICD-10-CM | POA: Diagnosis not present

## 2021-03-10 DIAGNOSIS — I493 Ventricular premature depolarization: Secondary | ICD-10-CM

## 2021-03-10 DIAGNOSIS — R1013 Epigastric pain: Secondary | ICD-10-CM | POA: Diagnosis not present

## 2021-03-10 DIAGNOSIS — R066 Hiccough: Secondary | ICD-10-CM | POA: Diagnosis not present

## 2021-03-10 LAB — ECHOCARDIOGRAM COMPLETE
AR max vel: 3.62 cm2
AV Area VTI: 3.76 cm2
AV Area mean vel: 3.47 cm2
AV Mean grad: 3 mmHg
AV Peak grad: 5.7 mmHg
Ao pk vel: 1.19 m/s
Area-P 1/2: 3.6 cm2
Height: 72 in
Weight: 3712 oz

## 2021-03-10 LAB — MAGNESIUM: Magnesium: 2.1 mg/dL (ref 1.7–2.4)

## 2021-03-10 MED ORDER — METOPROLOL TARTRATE 25 MG PO TABS
25.0000 mg | ORAL_TABLET | Freq: Two times a day (BID) | ORAL | Status: DC
  Administered 2021-03-10 – 2021-03-13 (×7): 25 mg via ORAL
  Filled 2021-03-10 (×7): qty 1

## 2021-03-10 MED ORDER — PERFLUTREN LIPID MICROSPHERE
1.0000 mL | INTRAVENOUS | Status: AC | PRN
  Administered 2021-03-10: 2 mL via INTRAVENOUS
  Filled 2021-03-10: qty 10

## 2021-03-10 MED ORDER — METOCLOPRAMIDE HCL 5 MG PO TABS
5.0000 mg | ORAL_TABLET | Freq: Three times a day (TID) | ORAL | Status: DC
  Filled 2021-03-10: qty 1

## 2021-03-10 MED ORDER — PANTOPRAZOLE SODIUM 40 MG PO TBEC
40.0000 mg | DELAYED_RELEASE_TABLET | Freq: Two times a day (BID) | ORAL | Status: DC
  Administered 2021-03-10 – 2021-03-13 (×6): 40 mg via ORAL
  Filled 2021-03-10 (×6): qty 1

## 2021-03-10 NOTE — Care Management Obs Status (Signed)
[facility] NOTIFICATION   Patient Details  Name: [name] MRN: [id number] Date of Birth: [birth year]   Medicare Observation Status Notification Given:  Yes    Dessa Phi, RN 03/10/2021, 4:12 PM

## 2021-03-10 NOTE — Progress Notes (Signed)
PROGRESS NOTE    [name]  TGG:[id number] DOB: [birth year] DOA: 03/08/2021 PCP: Joseph Stains, MD    Brief Narrative:  [name] was admitted to the hospital with a working diagnosis of atypical chest pain.  80 year old male past medical history for coronary artery disease, history of pulmonary embolism, hypertension, and SVT who presented with chest pain.  Reported substernal chest pain, dull/pressure in nature, with no radiation.  Associated with dyspnea and diaphoresis.  Pain seems to be worse with hiccups that he has been experiencing more frequently.  On his initial physical examination blood pressure 137/75, heart rate 91, respiratory rate 20, oxygen saturation 92%. His lungs were clear to auscultation bilaterally, heart S1-S2, present rhythmic, soft abdomen, no lower extremity edema.  Sodium 143, potassium 4.2, chloride 107, bicarb 27, glucose 136, BUN 19, creatinine 1.35, high sensitive troponin 8-6-9, white count 10.8, hemoglobin 16.9, hematocrit 49.6, platelets 221. SARS COVID-19 negative.  Chest radiograph no infiltrates. CT chest negative for pulm embolism, no infiltrates.  Positive cholelithiasis.  EKG 108 bpm, left axis deviation, normal intervals, sinus rhythm with PVCs, no ST segment or T wave changes.  Patient placed on lorazepam with mild improvement, but continue to have significant hiccups.   Assessment & Plan:   Principal Problem:   Chest pain Active Problems:   Essential hypertension   CAD S/P percutaneous coronary angioplasty   History of pulmonary embolism   Hiccups    1. Atypical chest pain, in the setting of intractable hiccups/ anxiety . Patient ruled out for acute coronary syndrome. Hiccups seem to trigger chest discomfort, plus dyspnea.   Lorazepam prn and increased dose of clonazepam have helped but patient continue to have persistent hiccups.  Add metoclopramide tid, and increase pantoprazole to 40 mg po bid.  Will check with GI  for possible new recommendation.   For anxiety, continue with clonazepam, mirtazapine and paroxetine, plan for outpatient psychiatry follow up.   2. Hx of Pulmonary embolism. On apixaban for anticoagulation   3. CAD/ dyslipidemia. . On statin therapy.  4. HTN. On irbesartan for blood pressure control.   5. SVT. On amiodarone.   6. Gout. On allopurinol, no signs of acute exacerbation   7. CKD stage 3a. Patient tolerating po well, no nausea or vomiting.    Status is: Observation  The patient remains OBS appropriate and will d/c before 2 midnights.  Dispo: The patient is from: Home              Anticipated d/c is to: Home              Patient currently is not medically stable to d/c.   Difficult to place patient No   DVT prophylaxis: apixaban    Code Status:   full  Family Communication:  I spoke with patient's wife at the bedside, we talked in detail about patient's condition, plan of care and prognosis and all questions were addressed.     Subjective: Patient continue to have hiccups, mild improvement with lorazepam but not yet resolved, not yet back to baseline.   Objective: Vitals:   03/09/21 2200 03/10/21 0208 03/10/21 0440 03/10/21 0918  BP: 123/77 (!) 165/88 (!) 141/78 (!) 167/86  Pulse: 83 85 80 86  Resp: 18 18 17    Temp: 97.8 F (36.6 C) 98 F (36.7 C) 97.6 F (36.4 C)   TempSrc: Oral Oral Oral   SpO2: 96% 98% 98%   Weight:      Height:  Intake/Output Summary (Last 24 hours) at 03/10/2021 1036 Last data filed at 03/10/2021 0834 Gross per 24 hour  Intake 240 ml  Output --  Net 240 ml   Filed Weights   03/08/21 1718  Weight: 105.2 kg    Examination:   General: Not in pain or dyspnea, deconditioned  Neurology: Awake and alert, non focal  E ENT: no pallor, no icterus, oral mucosa moist Cardiovascular: No JVD. S1-S2 present, rhythmic, no gallops, rubs, or murmurs. No lower extremity edema. Pulmonary: positive breath sounds bilaterally,  adequate air movement, no wheezing, rhonchi or rales. Gastrointestinal. Abdomen soft and non tender Skin. No rashes Musculoskeletal: no joint deformities     Data Reviewed: I have personally reviewed following labs and imaging studies  CBC: Recent Labs  Lab 03/08/21 1746  WBC 10.8*  NEUTROABS 9.6*  HGB 16.9  HCT 49.6  MCV 97.8  PLT 010   Basic Metabolic Panel: Recent Labs  Lab 03/08/21 1746  NA 143  K 4.2  CL 107  CO2 27  GLUCOSE 136*  BUN 19  CREATININE 1.35*  CALCIUM 8.9   GFR: Estimated Creatinine Clearance: 57.4 mL/min (A) (by C-G formula based on SCr of 1.35 mg/dL (H)). Liver Function Tests: Recent Labs  Lab 03/08/21 1746  AST 27  ALT 25  ALKPHOS 49  BILITOT 0.8  PROT 7.4  ALBUMIN 4.3   No results for input(s): LIPASE, AMYLASE in the last 168 hours. No results for input(s): AMMONIA in the last 168 hours. Coagulation Profile: No results for input(s): INR, PROTIME in the last 168 hours. Cardiac Enzymes: No results for input(s): CKTOTAL, CKMB, CKMBINDEX, TROPONINI in the last 168 hours. BNP (last 3 results) No results for input(s): PROBNP in the last 8760 hours. HbA1C: No results for input(s): HGBA1C in the last 72 hours. CBG: No results for input(s): GLUCAP in the last 168 hours. Lipid Profile: Recent Labs    03/08/21 1746 03/09/21 0856  CHOL 102 98  HDL 40* 35*  LDLCALC 45 49  TRIG 83 71  CHOLHDL 2.6 2.8   Thyroid Function Tests: No results for input(s): TSH, T4TOTAL, FREET4, T3FREE, THYROIDAB in the last 72 hours. Anemia Panel: No results for input(s): VITAMINB12, FOLATE, FERRITIN, TIBC, IRON, RETICCTPCT in the last 72 hours.    Radiology Studies: I have reviewed all of the imaging during this hospital visit personally     Scheduled Meds: . allopurinol  300 mg Oral Daily  . amiodarone  100 mg Oral Daily  . apixaban  5 mg Oral BID  . clonazePAM  1 mg Oral BID  . irbesartan  150 mg Oral Daily  . melatonin  10 mg Oral QHS  .  mirtazapine  7.5 mg Oral QHS  . pantoprazole  20 mg Oral Daily  . PARoxetine  10 mg Oral Daily  . PARoxetine  20 mg Oral QHS  . rosuvastatin  10 mg Oral Daily   Continuous Infusions:   LOS: 0 days        Joseph Woolever Gerome Apley, MD

## 2021-03-10 NOTE — Progress Notes (Signed)
Patient sustained a mechanical fall.  Positive bleeding skin abrasion in the head.   Plan to apply pressure with dressings. Hold pm dose of apixaban.  Will continue to monitor further needs.

## 2021-03-10 NOTE — Consult Note (Signed)
Referring Provider: Dr. Cathlean Sauer, [facility] Primary Care Physician:  Harlan Stains, MD Primary Gastroenterologist:  Dr. Fuller Plan  Reason for Consultation:  Atypical chest pain and hiccups  HPI: [name] is a 80 y.o. male with past medical history for coronary artery disease, history of pulmonary embolism on Eliquis, hypertension, and SVT who presented to [facility] ED with chest pain.  He is also complaining of hiccups.  Cardiology does not think his pain is cardiac related.  Echo is pending.  His pain is not in his chest, rather his epigastrium.  He has a ventral hernia/diastases recti and he believes that his pain is related to that.  He says that he has had the hiccups for about a week or so.  Yesterday he started getting pain in his epigastrium that he says felt like someone kicked him in that area.  It happened multiple times, about 5 times before he got to the emergency department.  Work-up with labs and CT angio of the chest were unremarkable.  He has been getting pantoprazole twice daily here and has been started on Valium for the hiccups, which is helping.  He tells me he does not take anything at home for acid reflux, but he has Prevacid 15 mg daily on his medication list.  Of note, I saw him in our office back in October 2021 for complaints of dysphagia and he was scheduled for EGD.  He ended up canceling that procedure.  He tells me that his symptoms resolved so he decided not to proceed.  He does have history of esophageal stricture that was dilated by Dr. Oletta Lamas in 2004.  He also has a family history of colon cancer in his mother and she passed away from that condition.  His last colonoscopy was by Dr. Oletta Lamas in March 2010 at which time he was found to have 3 polyps that were removed one of them being a tubular adenoma removed from the cecum.  Others were polypoid colorectal mucosa without adenomatous changes.  He was also noted to have medium size internal hemorrhoids.  He was scheduled for  colonoscopy when I saw him back in October 2021 as well and once again did not proceed with that either.   Past Medical History:  Diagnosis Date  . Abnormal heart rhythm    frequent PVCs, Dr Ellyn Hack  . Allergies    previously was seen at [facility] office  . Anxiety    Wanda Plump  . Arthritis    KNEES, NECK, SHOULDERS, HANDS  . Asthma   . Biceps tendon tear    right  . Biceps tendon tear    bilateral arms   . CAD S/P percutaneous coronary angioplasty 10/11/2018   Cardiac Cath-PCI 06/10/2018: 90% mLAD --> DES PCI (ORSIRO 2.75X18) => 0%.  pRCA 25%. p-m Cx 40%. Ost OM1 50%, ost OM2 40% with 50% mid.  pLAD 30% & ost D1 75 % (med Rx).  Normal LVEDP.  Diagnostic       Intervention  Orsiro DES 2.75 x 18       . Cancer ([place])    skin cancer-  2 basal cell, 1 squamous cell  . Cervical spinal stenosis    with foramenal stenosis  . Chronic pain    Dr Clarnce Flock to tolerate opioids, antiseizure meds (keppra, topamax, lyrica) SNRI, tramadol not effective, Dr Nelva Bush  . Congenital absence of one kidney    has the right kidney  . Depression    Wanda Plump  . DVT (  deep venous thrombosis) (HCC)    hx of w/ pulmonary embolus (4) on chronic anticoagulation  . Dysthymia    Wanda Plump  . Esophageal reflux    "w/wtricture s/p dilation, Dr Oletta Lamas" per notes from [place] [facility] [place]  . Gallstones 04/2014   asymptomatic on CT  . GERD (gastroesophageal reflux disease)   . Gout, arthritis    BILATERAL GREAT TOE-- PER PT STABLE  . Gynecomastia    8/17 mammogram benign  . H/O transfusion of packed red blood cells    as a child/ s/p tonsillectomy  . Headache    after CVA, improved on Topamax  . Hearing loss    hearing aids 2017, [facility]  . History of pulmonary embolism    Maintained on lifelong anticoagulation with Eliquis.  . Hypertension   . Hypogonadism in male    Dr Gilford Rile  . Lung nodule    BENIGN RIGHT UPPER--  CHEST CT 08-22-2011  . Moderate obstructive  sleep apnea    (PSG 09/26/08 AHI 5/hr REM 25/hr, RDI 28/hr REM 37/hr, 02 nadir 88%; CPAP titration recommended; HSAT 02/03/18 ESS 9, AHI 21/hr, O2 min 83%), Dr Maxwell Caul, has not worn since 10/19, sleeps better without it (per [facility] notes).  . Obesity   . OSA (obstructive sleep apnea)    NO CPAP -states dr said possibly related to asthma  . Paresthesias    right foot  . S/P dilatation of esophageal stricture   . Stroke [facility])    3 small lacunar infarcts, parietal lobe, 8/15--Dr Tomi Likens, R retinal artery occlusion 8/15, lost almost 50% of vision R eye  . Torn rotator cuff    Right x 3  . Tubular adenoma 01/2009   polyps, Dr Oletta Lamas    Past Surgical History:  Procedure Laterality Date  . CARPAL TUNNEL RELEASE Right 2010  . COLON SURGERY     x3  . CORONARY CALCIUM SCORING WITH CT CORONARY ANGIOGRAM  05/29/2018   Coronary calcium score 416.  Possible obstructive CAD in the mid circumflex-OM1-OM 2, D2 and a moderate disease in the proximal LAD -->  FFR --> LAD: Proximal.90, mid.86 distal.68 D2.80, Cx: Mid.76 OM2.74: Hemodynamically significant distal LAD, D2, mid circumflex and OM2 lesions  . CORONARY STENT INTERVENTION N/A 06/10/2018   Procedure: CORONARY STENT INTERVENTION;  Surgeon: Jettie Booze, MD;  Location: [facility] INVASIVE CV LAB;;  90% mLAD --> DES PCI (ORSIRO 2.75X18) => 0%  . JOINT REPLACEMENT    . LEFT HEART CATH AND CORONARY ANGIOGRAPHY N/A 06/10/2018   Procedure: LEFT HEART CATH AND CORONARY ANGIOGRAPHY;  Surgeon: Jettie Booze, MD;  Location: [facility] INVASIVE CV LAB;;  90% mLAD --> DES PCI.  pRCA 25%. p-m Cx 40%. Ost OM1 50%, ost OM2 40% with 50% mid.  pLAD 30% & ost D1 75 % (med Rx).  Normal LVEDP.   [name] NEPHRECTOMY    . SHOULDER ARTHROSCOPY Right   . SHOULDER ARTHROSCOPY Left 2021  . TONSILLECTOMY  AGE 81  . TOTAL KNEE ARTHROPLASTY Left 10-15-2008  . TOTAL KNEE ARTHROPLASTY Right 01/18/2014   Procedure: RIGHT TOTAL KNEE ARTHROPLASTY;  Surgeon: Gearlean Alf, MD;  Location: [facility] ORS;  Service: Orthopedics;  Laterality: Right;  . TRANSTHORACIC ECHOCARDIOGRAM  01/2018    Normal LV size with moderate LVH.  EF 60 to 65% with GR 1 DD (normal for age)  . WRIST GANGLION EXCISION Right 1964    Prior to Admission medications   Medication Sig  Start Date End Date Taking? Authorizing Provider  acetaminophen (TYLENOL) 500 MG tablet Take 1,000 mg by mouth every 6 (six) hours as needed.   Yes [provider]  allopurinol (ZYLOPRIM) 300 MG tablet Take 300 mg by mouth daily.   Yes [provider]  amiodarone (PACERONE) 100 MG tablet Take 1 tablet (100 mg total) by mouth daily. 12/23/20  Yes Leonie Man, MD  apixaban (ELIQUIS) 5 MG TABS tablet Take 5 mg by mouth 2 (two) times daily.   Yes [provider]  baclofen (LIORESAL) 10 MG tablet Take 10 mg by mouth 2 (two) times daily. 03/07/21  Yes [provider]  clonazePAM (KLONOPIN) 0.5 MG tablet Take 0.5 mg by mouth daily. 11/09/20  Yes [provider]  fluticasone (FLONASE) 50 MCG/ACT nasal spray Place 1 spray into both nostrils daily.   Yes [provider]  Lactobacillus (PROBIOTIC ACIDOPHILUS PO) Take 1 tablet by mouth daily.   Yes [provider]  lansoprazole (PREVACID) 15 MG capsule Take 15 mg by mouth daily at 12 noon.   Yes [provider]  Melatonin 10 MG TABS Take 10 mg by mouth daily.   Yes [provider]  mirtazapine (REMERON) 7.5 MG tablet Take 7.5 mg by mouth at bedtime. 10/18/20  Yes [provider]  Multiple Vitamin (MULTIVITAMIN WITH MINERALS) TABS tablet Take 1 tablet by mouth daily.   Yes [provider]  nitroGLYCERIN (NITROSTAT) 0.4 MG SL tablet Place 1 tablet (0.4 mg total) under the tongue every 5 (five) minutes as needed for chest pain. 06/12/18  Yes Leonie Man, MD  ondansetron (ZOFRAN) 4 MG tablet Take 1 tablet (4 mg total) by mouth every 8 (eight) hours as needed for nausea or  vomiting. 03/03/20  Yes Nicholes Stairs, MD  PARoxetine (PAXIL) 20 MG tablet Take 10 mg by mouth daily. 11/09/20  Yes [provider]  rosuvastatin (CRESTOR) 20 MG tablet Take 0.5 tablets (10 mg total) by mouth daily. 01/03/21  Yes Leonie Man, MD  testosterone cypionate (DEPOTESTOSTERONE CYPIONATE) 200 MG/ML injection Inject 100 mg into the muscle once a week. 1 ml weekly 01/14/20  Yes [provider]  traMADol-acetaminophen (ULTRACET) 37.5-325 MG tablet Take 2 tablets by mouth every 8 (eight) hours as needed for moderate pain.   Yes [provider]  valsartan (DIOVAN) 160 MG tablet Take 1 tablet (160 mg total) by mouth daily. 09/15/20  Yes Leonie Man, MD  methocarbamol (ROBAXIN) 750 MG tablet Take 750 mg by mouth 3 (three) times daily.    [provider]    Current Facility-Administered Medications  Medication Dose Route Frequency Provider Last Rate Last Admin  . acetaminophen (TYLENOL) tablet 650 mg  650 mg Oral Q4H PRN Chotiner, Yevonne Aline, MD      . allopurinol (ZYLOPRIM) tablet 300 mg  300 mg Oral Daily Chotiner, Yevonne Aline, MD   300 mg at 03/10/21 0916  . amiodarone (PACERONE) tablet 100 mg  100 mg Oral Daily Chotiner, Yevonne Aline, MD   100 mg at 03/10/21 0915  . apixaban (ELIQUIS) tablet 5 mg  5 mg Oral BID Chotiner, Yevonne Aline, MD   5 mg at 03/10/21 0915  . clonazePAM (KLONOPIN) tablet 1 mg  1 mg Oral BID Arrien, Jimmy Picket, MD   1 mg at 03/10/21 0915  . irbesartan (AVAPRO) tablet 150 mg  150 mg Oral Daily Chotiner, Yevonne Aline, MD   150 mg at 03/10/21 0915  . LORazepam (ATIVAN)  tablet 1 mg  1 mg Oral Q4H PRN Arrien, Jimmy Picket, MD   1 mg at 03/10/21 1026  . melatonin tablet 10 mg  10 mg Oral QHS Chotiner, Yevonne Aline, MD   10 mg at 03/09/21 2113  . metoprolol tartrate (LOPRESSOR) tablet 25 mg  25 mg Oral BID Bhagat, Bhavinkumar, PA   25 mg at 03/10/21 1217  . mirtazapine (REMERON) tablet 7.5 mg  7.5 mg Oral QHS Wyvonnia Dusky, MD   7.5  mg at 03/09/21 2113  . nitroGLYCERIN (NITROSTAT) SL tablet 0.4 mg  0.4 mg Sublingual Q5 min PRN Chotiner, Yevonne Aline, MD   0.4 mg at 03/09/21 1404  . ondansetron (ZOFRAN) injection 4 mg  4 mg Intravenous Q6H PRN Chotiner, Yevonne Aline, MD      . pantoprazole (PROTONIX) EC tablet 40 mg  40 mg Oral BID Arrien, Jimmy Picket, MD      . PARoxetine (PAXIL) tablet 10 mg  10 mg Oral Daily Chotiner, Yevonne Aline, MD   10 mg at 03/10/21 0916  . PARoxetine (PAXIL) tablet 20 mg  20 mg Oral QHS Wyvonnia Dusky, MD   20 mg at 03/09/21 2113  . rosuvastatin (CRESTOR) tablet 10 mg  10 mg Oral Daily Chotiner, Yevonne Aline, MD   10 mg at 03/10/21 [id number]    Allergies as of 03/08/2021 - Review Complete 03/08/2021  Allergen Reaction Noted  . Garlic Anaphylaxis [id number]  . Onion Anaphylaxis 01/11/2014  . Amlodipine  12/19/2020  . Budesonide-formoterol fumarate  12/19/2020  . Buprenorphine Nausea Only 12/19/2020  . Buspar [buspirone]  12/19/2020  . Celecoxib  12/19/2020  . Codeine Other (See Comments) 03/03/2012  . Duloxetine  12/19/2020  . Fluticasone  12/19/2020  . Fluticasone-salmeterol  12/19/2020  . Glucosamine forte [nutritional supplements]  12/19/2020  . Hydrocodone  12/12/2015  . Ibuprofen  12/19/2020  . Lisinopril  12/19/2020  . Mometasone  12/19/2020  . Novocain [procaine] Nausea Only 07/13/2013  . Sildenafil  12/19/2020  . Topiramate Nausea Only 12/19/2020  . Trazodone and nefazodone  12/19/2020    Family History  Problem Relation Age of Onset  . Deep vein thrombosis Father   . Stroke Father   . Stroke Brother   . Colon cancer Mother   . Migraines Neg Hx   . Pancreatic cancer Neg Hx   . Stomach cancer Neg Hx     Social History   Socioeconomic History  . Marital status: Married    Spouse name: Not on file  . Number of children: 4  . Years of education: some master's classes  . Highest education level: Bachelor's degree (e.g., BA, AB, BS)  Occupational History  . Occupation: CNA   Tobacco Use  . Smoking status: Former Smoker    Packs/day: 1.00    Years: 25.00    Pack years: 25.00    Types: Cigarettes    Quit date: 07/13/1980    Years since quitting: 40.6  . Smokeless tobacco: Never Used  Vaping Use  . Vaping Use: Never used  Substance and Sexual Activity  . Alcohol use: Yes    Comment: RARE  . Drug use: No  . Sexual activity: Yes    Partners: Female  Other Topics Concern  . Not on file  Social History Narrative   Lives at home with his wife & youngest daughter and her family   Right handed   Drinks 2 cups of coffee daily, occasional cup of tea   Social Determinants of Health  Financial Resource Strain: Not on file  Food Insecurity: Not on file  Transportation Needs: Not on file  Physical Activity: Not on file  Stress: Not on file  Social Connections: Not on file  Intimate Partner Violence: Not on file    Review of Systems: ROS is O/W negative except as mentioned in HPI.  Physical Exam: Vital signs in last 24 hours: Temp:  [97.6 F (36.4 C)-98.2 F (36.8 C)] 98.2 F (36.8 C) (05/06 1306) Pulse Rate:  [44-101] 70 (05/06 1307) Resp:  [17-18] 17 (05/06 1306) BP: (123-167)/(70-98) 166/98 (05/06 1306) SpO2:  [96 %-99 %] 99 % (05/06 1307) Last BM Date: 03/08/21 General:  Alert, Well-developed, well-nourished, pleasant and cooperative in NAD Head:  Normocephalic and atraumatic. Eyes:  Sclera clear, no icterus.  Conjunctiva pink. Ears:  Normal auditory acuity. Mouth:  No deformity or lesions.   Lungs:  Clear throughout to auscultation.  No wheezes, crackles, or rhonchi.  Heart:  Regular rate and rhythm; no murmurs, clicks, rubs, or gallops. Abdomen:  Soft, non-distended.  BS present.  Diastasis recti noted with tenderness in that area. Msk:  Symmetrical without gross deformities. Pulses:  Normal pulses noted. Extremities:  Without clubbing or edema. Neurologic:  Alert and oriented x 4;  grossly normal neurologically. Skin:  Intact without  significant lesions or rashes. Psych:  Alert and cooperative. Normal mood and affect.  Intake/Output this shift: Total I/O In: 720 [P.O.:720] Out: -   Lab Results: Recent Labs    03/08/21 1746  WBC 10.8*  HGB 16.9  HCT 49.6  PLT 221   BMET Recent Labs    03/08/21 1746  NA 143  K 4.2  CL 107  CO2 27  GLUCOSE 136*  BUN 19  CREATININE 1.35*  CALCIUM 8.9   LFT Recent Labs    03/08/21 1746  PROT 7.4  ALBUMIN 4.3  AST 27  ALT 25  ALKPHOS 49  BILITOT 0.8  BILIDIR 0.2  IBILI 0.6   Studies/Results: DG Chest 2 View  Result Date: 03/08/2021 CLINICAL DATA:  Chest pain, dyspnea EXAM: CHEST - 2 VIEW COMPARISON:  07/12/2020 FINDINGS: Lungs are well expanded, symmetric, and clear. No pneumothorax or pleural effusion. Cardiac size within normal limits. Pulmonary vascularity is normal. Osseous structures are age-appropriate. No acute bone abnormality. IMPRESSION: No active cardiopulmonary disease. Electronically Signed   By: Fidela Salisbury MD   On: 03/08/2021 18:26   CT Angio Chest PE W and/or Wo Contrast  Result Date: 03/08/2021 CLINICAL DATA:  Chest pain, shortness of breath EXAM: CT ANGIOGRAPHY CHEST WITH CONTRAST TECHNIQUE: Multidetector CT imaging of the chest was performed using the standard protocol during bolus administration of intravenous contrast. Multiplanar CT image reconstructions and MIPs were obtained to evaluate the vascular anatomy. CONTRAST:  157mL OMNIPAQUE IOHEXOL 350 MG/ML SOLN COMPARISON:  05/29/2018, 03/08/2021 FINDINGS: Cardiovascular: This is a technically adequate evaluation of the pulmonary vasculature. No filling defects or pulmonary emboli. The heart is unremarkable without pericardial effusion. No evidence of thoracic aortic aneurysm or dissection. Mild atherosclerosis of the aorta and coronary vasculature. Mediastinum/Nodes: No enlarged mediastinal, hilar, or axillary lymph nodes. Thyroid gland, trachea, and esophagus demonstrate no significant findings.  Lungs/Pleura: No acute airspace disease, effusion, or pneumothorax. The central airways are patent. Upper Abdomen: Calcified gallstones are identified without cholecystitis. The remainder of the upper abdomen is unremarkable. Musculoskeletal: There are no acute or destructive bony lesions. Reconstructed images demonstrate no additional findings. Review of the MIP images confirms the above findings. IMPRESSION: 1. No  evidence of pulmonary embolus. 2. No acute intrathoracic process. 3. Cholelithiasis without cholecystitis. 4. Aortic Atherosclerosis (ICD10-I70.0). Coronary artery atherosclerosis. Electronically Signed   By: Randa Ngo M.D.   On: 03/08/2021 21:30   IMPRESSION:  *Chest pain, atypical and associated with hiccups:  Cardio feels not cardiac related.  ECHO pending.  This is actually not chest pain, but epigastric pain.  He has a ventral hernia/diastasis recti and thinks that his symptoms are related to that.  He does not think it is acid related, etc.  At the time of my visit he is not interested in EGD. *History of PE on Eliquis *Family history of colon cancer in his mother: Patient's last colonoscopy was in March 2010 at which time he had a tubular adenoma removed from the cecum.  He was scheduled for colonoscopy when he saw me in October 2021, but canceled the procedure shortly after that time.  PLAN: -Pantoprazole 40 mg BID. -Valium for hiccups as currently ordered or thorazine if needed. -If EGD were to be performed it would be Sunday at the earliest as there is no availability tomorrow in endo and he would need Eliquis to be held 24-48 hours as well. -Will need outpatient colonoscopy in the near future.   Laban Emperor. Alexei Ey  03/10/2021, 3:26 PM

## 2021-03-10 NOTE — Progress Notes (Addendum)
Progress Note  Patient Name: [name] Date of Encounter: 03/10/2021  [facility] Cardiologist: Glenetta Hew, MD   Subjective   No CP. Continued hiccups. No dyspnea.   Inpatient Medications    Scheduled Meds: . allopurinol  300 mg Oral Daily  . amiodarone  100 mg Oral Daily  . apixaban  5 mg Oral BID  . clonazePAM  1 mg Oral BID  . irbesartan  150 mg Oral Daily  . melatonin  10 mg Oral QHS  . metoCLOPramide  5 mg Oral TID  . metoprolol tartrate  25 mg Oral BID  . mirtazapine  7.5 mg Oral QHS  . pantoprazole  40 mg Oral BID  . PARoxetine  10 mg Oral Daily  . PARoxetine  20 mg Oral QHS  . rosuvastatin  10 mg Oral Daily   Continuous Infusions:  PRN Meds: acetaminophen, LORazepam, nitroGLYCERIN, ondansetron (ZOFRAN) IV   Vital Signs    Vitals:   03/09/21 2200 03/10/21 0208 03/10/21 0440 03/10/21 0918  BP: 123/77 (!) 165/88 (!) 141/78 (!) 167/86  Pulse: 83 85 80 86  Resp: 18 18 17    Temp: 97.8 F (36.6 C) 98 F (36.7 C) 97.6 F (36.4 C)   TempSrc: Oral Oral Oral   SpO2: 96% 98% 98%   Weight:      Height:        Intake/Output Summary (Last 24 hours) at 03/10/2021 1147 Last data filed at 03/10/2021 0834 Gross per 24 hour  Intake 240 ml  Output --  Net 240 ml   Last 3 Weights 03/08/2021 12/23/2020 12/19/2020  Weight (lbs) 232 lb 230 lb 237 lb  Weight (kg) 105.235 kg 104.327 kg 107.502 kg      Telemetry    SR, PVCs, ventricular bigeminy - Personally Reviewed  ECG    N/A  Physical Exam   GEN: No acute distress.   Neck: No JVD Cardiac: RRR, no murmurs, rubs, or gallops.  Respiratory: Clear to auscultation bilaterally. GI: Soft, nontender, non-distended  MS: No edema; No deformity. Neuro:  Nonfocal  Psych: Normal affect   Labs    High Sensitivity Troponin:   Recent Labs  Lab 03/08/21 1746 03/08/21 2035 03/09/21 0856  TROPONINIHS 8 6 9       Chemistry Recent Labs  Lab 03/08/21 1746  NA 143  K 4.2  CL 107  CO2 27  GLUCOSE 136*   BUN 19  CREATININE 1.35*  CALCIUM 8.9  PROT 7.4  ALBUMIN 4.3  AST 27  ALT 25  ALKPHOS 49  BILITOT 0.8  GFRNONAA 54*  ANIONGAP 9     Hematology Recent Labs  Lab 03/08/21 1746  WBC 10.8*  RBC 5.07  HGB 16.9  HCT 49.6  MCV 97.8  MCH 33.3  MCHC 34.1  RDW 13.5  PLT 221    BNPNo results for input(s): BNP, PROBNP in the last 168 hours.   DDimer No results for input(s): DDIMER in the last 168 hours.   Radiology    DG Chest 2 View  Result Date: 03/08/2021 CLINICAL DATA:  Chest pain, dyspnea EXAM: CHEST - 2 VIEW COMPARISON:  07/12/2020 FINDINGS: Lungs are well expanded, symmetric, and clear. No pneumothorax or pleural effusion. Cardiac size within normal limits. Pulmonary vascularity is normal. Osseous structures are age-appropriate. No acute bone abnormality. IMPRESSION: No active cardiopulmonary disease. Electronically Signed   By: Fidela Salisbury MD   On: 03/08/2021 18:26   CT Angio Chest PE W and/or Wo Contrast  Result  Date: 03/08/2021 CLINICAL DATA:  Chest pain, shortness of breath EXAM: CT ANGIOGRAPHY CHEST WITH CONTRAST TECHNIQUE: Multidetector CT imaging of the chest was performed using the standard protocol during bolus administration of intravenous contrast. Multiplanar CT image reconstructions and MIPs were obtained to evaluate the vascular anatomy. CONTRAST:  1110mL OMNIPAQUE IOHEXOL 350 MG/ML SOLN COMPARISON:  05/29/2018, 03/08/2021 FINDINGS: Cardiovascular: This is a technically adequate evaluation of the pulmonary vasculature. No filling defects or pulmonary emboli. The heart is unremarkable without pericardial effusion. No evidence of thoracic aortic aneurysm or dissection. Mild atherosclerosis of the aorta and coronary vasculature. Mediastinum/Nodes: No enlarged mediastinal, hilar, or axillary lymph nodes. Thyroid gland, trachea, and esophagus demonstrate no significant findings. Lungs/Pleura: No acute airspace disease, effusion, or pneumothorax. The central airways are  patent. Upper Abdomen: Calcified gallstones are identified without cholecystitis. The remainder of the upper abdomen is unremarkable. Musculoskeletal: There are no acute or destructive bony lesions. Reconstructed images demonstrate no additional findings. Review of the MIP images confirms the above findings. IMPRESSION: 1. No evidence of pulmonary embolus. 2. No acute intrathoracic process. 3. Cholelithiasis without cholecystitis. 4. Aortic Atherosclerosis (ICD10-I70.0). Coronary artery atherosclerosis. Electronically Signed   By: Randa Ngo M.D.   On: 03/08/2021 21:30    Cardiac Studies   None this admission   Patient Profile     80 y.o. 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  Assessment & Plan    1.  Chest pain Felt atypical and associated with hiccups.  Troponin negative.  EKG without acute ischemic changes.  Dr. Radford Pax recommended GI evaluation.  2.  ASCAD -cath in Aug 2019 showed 25% pRCA, 50% pOM1 and pOM2, 40% mid LCx, 30% pLAD,  75% pD1, 90% dLAD and underwent PCI of dLAD. -per Dr. Allison Quarry last note, he has chronic atypical stable angina Class 2-3 but he tells me he never had CP and only found out he had CAD because he was having frequent PVCs -no ASA or Plavix due to Eliquis -Plan was to add lopressor but no orders >> will start metoprolol 25mg  BID  3.  HLD -LDL goal < 70 -LDL was 49 this admit -continue Crestor 10mg  daily  4.  NSVT / PVCS -no VT on tele  However frequent PVCs and vent, bigemeny -Continue home dose of Amio 100mg  daily - Start metoprolol 25mg  BID which helps with PVCs and elevated BP  5.  HTN -BP elevated  -continue Irbesartan 150mg  daily - Add BB as above   6.  Hiccups -? Etiology -needs GI eval     For questions or updates, please contact [place] Please consult [email or web address] for contact info under         SignedLeanor Kail, PA  03/10/2021, 11:47 AM

## 2021-03-10 NOTE — Progress Notes (Signed)
   03/10/21 1839  What Happened  Was fall witnessed? No  Was patient injured? Yes  Patient found on floor  Found by Staff-comment  Stated prior activity bathroom-unassisted  Follow Up  MD notified Arrien  Time MD notified 66  Family notified Yes - comment (Daughter/ wife)  Time family notified [phone]  Additional tests No  Simple treatment  (pressure dressing applied)  Adult Fall Risk Assessment  Risk Factor Category (scoring not indicated) Fall has occurred during this admission (document High fall risk)  Adult Fall Risk Interventions  Required Bundle Interventions *See Row Information* High fall risk - low, moderate, and high requirements implemented  Additional Interventions Family Supervision;Use of appropriate toileting equipment (bedpan, BSC, etc.)  Screening for Fall Injury Risk (To be completed on HIGH fall risk patients) - Assessing Need for Floor Mats  Risk For Fall Injury- Criteria for Floor Mats Bleeding risk-anticoagulation (not prophylaxis)  Will Implement Floor Mats Yes  Vitals  Temp 97.8 F (36.6 C)  Temp Source Oral  BP 120/88  MAP (mmHg) 98  BP Location Right Arm  BP Method Automatic  Patient Position (if appropriate) Lying  Pulse Rate 80  Pulse Rate Source Monitor  Resp 18  Oxygen Therapy  SpO2 96 %  Pain Assessment  Pain Scale 0-10  Pain Score 2  Pain Type Acute pain  Pain Location Head  Pain Orientation Mid;Posterior  Pain Descriptors / Indicators Discomfort  Pain Frequency Constant  Pain Onset On-going  Patients Stated Pain Goal 2  Pain Intervention(s) MD notified (Comment);RN made aware  Multiple Pain Sites No  PCA/Epidural/Spinal Assessment  Respiratory Pattern Regular;Unlabored  Neurological  Neuro (WDL) WDL  Level of Consciousness Alert  Orientation Level Oriented X4  Cognition Appropriate at baseline;Appropriate attention/concentration;Follows commands  Speech Clear  Pupil Assessment  Yes  R Pupil Size (mm) 3  R Pupil Shape Round   R Pupil Reaction Brisk  L Pupil Size (mm) 3  L Pupil Shape Round  L Pupil Reaction Brisk  Motor Function/Sensation Assessment Grip  R Hand Grip Present  L Hand Grip Present  Neuro Symptoms None  Musculoskeletal  Musculoskeletal (WDL) X  Generalized Weakness Yes  Integumentary  Integumentary (WDL) X  RN Assisting with Skin Assessment on Admission Chancy Hurter RN  Skin Color Appropriate for ethnicity  Skin Condition Dry  Skin Integrity Abrasion;Ecchymosis  Abrasion Location Head  Abrasion Location Orientation Mid;Posterior  Abrasion Intervention  (pressure dressing applied)  Ecchymosis Location Knee  Ecchymosis Location Orientation Left  Ecchymosis Intervention  (assessed)  Pain Assessment  Date Pain First Started 03/10/21  Result of Injury Yes  Pain Assessment  Work-Related Injury No

## 2021-03-10 NOTE — Progress Notes (Signed)
  Echocardiogram 2D Echocardiogram with contrast has been performed.  Merrie Roof F 03/10/2021, 4:55 PM

## 2021-03-11 ENCOUNTER — Inpatient Hospital Stay (HOSPITAL_COMMUNITY): Payer: Medicare Other

## 2021-03-11 ENCOUNTER — Observation Stay (HOSPITAL_COMMUNITY): Payer: Medicare Other

## 2021-03-11 DIAGNOSIS — Z885 Allergy status to narcotic agent status: Secondary | ICD-10-CM | POA: Diagnosis not present

## 2021-03-11 DIAGNOSIS — K8020 Calculus of gallbladder without cholecystitis without obstruction: Secondary | ICD-10-CM | POA: Diagnosis not present

## 2021-03-11 DIAGNOSIS — N1831 Chronic kidney disease, stage 3a: Secondary | ICD-10-CM | POA: Diagnosis present

## 2021-03-11 DIAGNOSIS — F32A Depression, unspecified: Secondary | ICD-10-CM | POA: Diagnosis present

## 2021-03-11 DIAGNOSIS — K760 Fatty (change of) liver, not elsewhere classified: Secondary | ICD-10-CM | POA: Diagnosis not present

## 2021-03-11 DIAGNOSIS — I471 Supraventricular tachycardia: Secondary | ICD-10-CM | POA: Diagnosis present

## 2021-03-11 DIAGNOSIS — B3781 Candidal esophagitis: Secondary | ICD-10-CM | POA: Diagnosis present

## 2021-03-11 DIAGNOSIS — I472 Ventricular tachycardia: Secondary | ICD-10-CM | POA: Diagnosis present

## 2021-03-11 DIAGNOSIS — Z9109 Other allergy status, other than to drugs and biological substances: Secondary | ICD-10-CM | POA: Diagnosis not present

## 2021-03-11 DIAGNOSIS — Z881 Allergy status to other antibiotic agents status: Secondary | ICD-10-CM | POA: Diagnosis not present

## 2021-03-11 DIAGNOSIS — R079 Chest pain, unspecified: Secondary | ICD-10-CM | POA: Diagnosis not present

## 2021-03-11 DIAGNOSIS — R066 Hiccough: Secondary | ICD-10-CM | POA: Diagnosis present

## 2021-03-11 DIAGNOSIS — F419 Anxiety disorder, unspecified: Secondary | ICD-10-CM | POA: Diagnosis present

## 2021-03-11 DIAGNOSIS — Z886 Allergy status to analgesic agent status: Secondary | ICD-10-CM | POA: Diagnosis not present

## 2021-03-11 DIAGNOSIS — Z7901 Long term (current) use of anticoagulants: Secondary | ICD-10-CM | POA: Diagnosis not present

## 2021-03-11 DIAGNOSIS — N281 Cyst of kidney, acquired: Secondary | ICD-10-CM | POA: Diagnosis not present

## 2021-03-11 DIAGNOSIS — M109 Gout, unspecified: Secondary | ICD-10-CM | POA: Diagnosis present

## 2021-03-11 DIAGNOSIS — R0789 Other chest pain: Secondary | ICD-10-CM | POA: Diagnosis present

## 2021-03-11 DIAGNOSIS — G9340 Encephalopathy, unspecified: Secondary | ICD-10-CM | POA: Diagnosis not present

## 2021-03-11 DIAGNOSIS — Z86711 Personal history of pulmonary embolism: Secondary | ICD-10-CM | POA: Diagnosis not present

## 2021-03-11 DIAGNOSIS — I129 Hypertensive chronic kidney disease with stage 1 through stage 4 chronic kidney disease, or unspecified chronic kidney disease: Secondary | ICD-10-CM | POA: Diagnosis present

## 2021-03-11 DIAGNOSIS — Z20822 Contact with and (suspected) exposure to covid-19: Secondary | ICD-10-CM | POA: Diagnosis present

## 2021-03-11 DIAGNOSIS — G9341 Metabolic encephalopathy: Secondary | ICD-10-CM

## 2021-03-11 DIAGNOSIS — W1830XA Fall on same level, unspecified, initial encounter: Secondary | ICD-10-CM | POA: Diagnosis not present

## 2021-03-11 DIAGNOSIS — I2510 Atherosclerotic heart disease of native coronary artery without angina pectoris: Secondary | ICD-10-CM | POA: Diagnosis present

## 2021-03-11 DIAGNOSIS — G928 Other toxic encephalopathy: Secondary | ICD-10-CM | POA: Diagnosis not present

## 2021-03-11 DIAGNOSIS — R1013 Epigastric pain: Secondary | ICD-10-CM | POA: Diagnosis present

## 2021-03-11 DIAGNOSIS — K7689 Other specified diseases of liver: Secondary | ICD-10-CM | POA: Diagnosis not present

## 2021-03-11 DIAGNOSIS — Z955 Presence of coronary angioplasty implant and graft: Secondary | ICD-10-CM | POA: Diagnosis not present

## 2021-03-11 DIAGNOSIS — S0191XA Laceration without foreign body of unspecified part of head, initial encounter: Secondary | ICD-10-CM | POA: Diagnosis not present

## 2021-03-11 DIAGNOSIS — E785 Hyperlipidemia, unspecified: Secondary | ICD-10-CM | POA: Diagnosis present

## 2021-03-11 DIAGNOSIS — I10 Essential (primary) hypertension: Secondary | ICD-10-CM | POA: Diagnosis not present

## 2021-03-11 MED ORDER — ALBUTEROL SULFATE (2.5 MG/3ML) 0.083% IN NEBU
2.5000 mg | INHALATION_SOLUTION | Freq: Four times a day (QID) | RESPIRATORY_TRACT | Status: DC | PRN
  Administered 2021-03-11: 2.5 mg via RESPIRATORY_TRACT
  Filled 2021-03-11: qty 3

## 2021-03-11 MED ORDER — CLONAZEPAM 0.5 MG PO TABS
0.5000 mg | ORAL_TABLET | Freq: Two times a day (BID) | ORAL | Status: DC
  Administered 2021-03-11 – 2021-03-13 (×4): 0.5 mg via ORAL
  Filled 2021-03-11 (×4): qty 1

## 2021-03-11 MED ORDER — CYCLOBENZAPRINE HCL 5 MG PO TABS
5.0000 mg | ORAL_TABLET | Freq: Three times a day (TID) | ORAL | Status: DC | PRN
  Administered 2021-03-11 – 2021-03-13 (×3): 5 mg via ORAL
  Filled 2021-03-11 (×3): qty 1

## 2021-03-11 MED ORDER — DEXTROSE IN LACTATED RINGERS 5 % IV SOLN: INTRAVENOUS | Status: DC

## 2021-03-11 NOTE — Progress Notes (Signed)
Progress Note  Patient Name: [name] Date of Encounter: 03/11/2021  Primary Cardiologist:   Glenetta Hew, MD   Subjective   Status post a fall yesterday.   Still with hiccups and nausea  Inpatient Medications    Scheduled Meds: . allopurinol  300 mg Oral Daily  . amiodarone  100 mg Oral Daily  . clonazePAM  1 mg Oral BID  . irbesartan  150 mg Oral Daily  . melatonin  10 mg Oral QHS  . metoprolol tartrate  25 mg Oral BID  . mirtazapine  7.5 mg Oral QHS  . pantoprazole  40 mg Oral BID  . PARoxetine  10 mg Oral Daily  . PARoxetine  20 mg Oral QHS  . rosuvastatin  10 mg Oral Daily   Continuous Infusions:  PRN Meds: acetaminophen, LORazepam, nitroGLYCERIN, ondansetron (ZOFRAN) IV   Vital Signs    Vitals:   03/11/21 0117 03/11/21 0242 03/11/21 0426 03/11/21 0602  BP: 130/78 119/84 131/67 (!) 154/77  Pulse: 66 62 (!) 57 78  Resp: 16 20 18 18   Temp: 98.4 F (36.9 C) 97.9 F (36.6 C) 98 F (36.7 C) 97.9 F (36.6 C)  TempSrc: Oral Oral Oral Oral  SpO2: 95% 96% 97% 99%  Weight:      Height:        Intake/Output Summary (Last 24 hours) at 03/11/2021 0752 Last data filed at 03/10/2021 1810 Gross per 24 hour  Intake 960 ml  Output --  Net 960 ml   Filed Weights   03/08/21 1718  Weight: 105.2 kg    Telemetry    NSR, PVCs, bigeminy, ventricular couples - Personally Reviewed  ECG    NA - Personally Reviewed  Physical Exam   GEN: No acute distress.    Chronically ill appearing Neck: No  JVD Cardiac: RRR, no murmurs, rubs, or gallops.  Respiratory: Clear  to auscultation bilaterally. GI: Soft, nontender, non-distended  MS: No  edema; No deformity. Neuro:  Nonfocal  Psych: Normal affect   Labs    Chemistry Recent Labs  Lab 03/08/21 1746  NA 143  K 4.2  CL 107  CO2 27  GLUCOSE 136*  BUN 19  CREATININE 1.35*  CALCIUM 8.9  PROT 7.4  ALBUMIN 4.3  AST 27  ALT 25  ALKPHOS 49  BILITOT 0.8  GFRNONAA 54*  ANIONGAP 9      Hematology Recent Labs  Lab 03/08/21 1746  WBC 10.8*  RBC 5.07  HGB 16.9  HCT 49.6  MCV 97.8  MCH 33.3  MCHC 34.1  RDW 13.5  PLT 221    Cardiac EnzymesNo results for input(s): TROPONINI in the last 168 hours. No results for input(s): TROPIPOC in the last 168 hours.   BNPNo results for input(s): BNP, PROBNP in the last 168 hours.   DDimer No results for input(s): DDIMER in the last 168 hours.   Radiology    ECHOCARDIOGRAM COMPLETE  Result Date: 03/10/2021    ECHOCARDIOGRAM REPORT   Patient Name:   [name] Date of Exam: 03/10/2021 Medical Rec #:  [id number]        Height:       72.0 in Accession #:    [id number]       Weight:       232.0 lb Date of Birth:  August 18, 1941       BSA:          2.269 m Patient Age:    80 years  BP:           166/98 mmHg Patient Gender: M                HR:           76 bpm. Exam Location:  Inpatient Procedure: 2D Echo, Cardiac Doppler, Color Doppler and Intracardiac            Opacification Agent Indications:    R07.9* Chest pain, unspecified  History:        Patient has prior history of Echocardiogram examinations, most                 recent 08/17/2020.  Sonographer:    Merrie Roof Referring Phys: [place] Comments: No parasternal window. IMPRESSIONS  1. Frequent PVCs noted.  2. Left ventricular ejection fraction, by estimation, is 55 to 60%. The left ventricle has normal function. The left ventricle has no regional wall motion abnormalities. Left ventricular diastolic parameters were normal.  3. Right ventricular systolic function is normal. The right ventricular size is normal. Tricuspid regurgitation signal is inadequate for assessing PA pressure.  4. The mitral valve is grossly normal. Mild mitral valve regurgitation. No evidence of mitral stenosis.  5. The aortic valve is grossly normal. Aortic valve regurgitation is not visualized. No aortic stenosis is present.  6. The inferior vena cava is normal in size with greater  than 50% respiratory variability, suggesting right atrial pressure of 3 mmHg. Comparison(s): No significant change from prior study. FINDINGS  Left Ventricle: Left ventricular ejection fraction, by estimation, is 55 to 60%. The left ventricle has normal function. The left ventricle has no regional wall motion abnormalities. Definity contrast agent was given IV to delineate the left ventricular  endocardial borders. The left ventricular internal cavity size was normal in size. There is no left ventricular hypertrophy. Left ventricular diastolic parameters were normal. Right Ventricle: The right ventricular size is normal. No increase in right ventricular wall thickness. Right ventricular systolic function is normal. Tricuspid regurgitation signal is inadequate for assessing PA pressure. Left Atrium: Left atrial size was normal in size. Right Atrium: Right atrial size was normal in size. Pericardium: Trivial pericardial effusion is present. Mitral Valve: The mitral valve is grossly normal. Mild mitral valve regurgitation. No evidence of mitral valve stenosis. Tricuspid Valve: The tricuspid valve is grossly normal. Tricuspid valve regurgitation is not demonstrated. No evidence of tricuspid stenosis. Aortic Valve: The aortic valve is grossly normal. Aortic valve regurgitation is not visualized. No aortic stenosis is present. Aortic valve mean gradient measures 3.0 mmHg. Aortic valve peak gradient measures 5.7 mmHg. Aortic valve area, by VTI measures 3.76 cm. Pulmonic Valve: The pulmonic valve was not well visualized. Aorta: The aortic root is normal in size and structure. Venous: The inferior vena cava is normal in size with greater than 50% respiratory variability, suggesting right atrial pressure of 3 mmHg. IAS/Shunts: The atrial septum is grossly normal.  LEFT VENTRICLE PLAX 2D LVOT diam:     2.40 cm  Diastology LV SV:         95       LV e' medial:    8.05 cm/s LV SV Index:   42       LV E/e' medial:  10.5 LVOT  Area:     4.52 cm LV e' lateral:   10.30 cm/s  LV E/e' lateral: 8.2  RIGHT VENTRICLE RV Basal diam:  4.49 cm RV Mid diam:    3.71 cm RV S prime:     11.10 cm/s TAPSE (M-mode): 1.5 cm LEFT ATRIUM              Index       RIGHT ATRIUM           Index LA Vol (A2C):   92.1 ml  40.59 ml/m RA Area:     19.10 cm LA Vol (A4C):   102.0 ml 44.95 ml/m RA Volume:   60.00 ml  26.44 ml/m LA Biplane Vol: 107.0 ml 47.15 ml/m  AORTIC VALVE AV Area (Vmax):    3.62 cm AV Area (Vmean):   3.47 cm AV Area (VTI):     3.76 cm AV Vmax:           119.00 cm/s AV Vmean:          86.900 cm/s AV VTI:            0.254 m AV Peak Grad:      5.7 mmHg AV Mean Grad:      3.0 mmHg LVOT Vmax:         95.30 cm/s LVOT Vmean:        66.700 cm/s LVOT VTI:          0.211 m LVOT/AV VTI ratio: 0.83 MITRAL VALVE MV Area (PHT): 3.60 cm     SHUNTS MV Decel Time: 211 msec     Systemic VTI:  0.21 m MV E velocity: 84.40 cm/s   Systemic Diam: 2.40 cm MV A velocity: 112.00 cm/s MV E/A ratio:  0.75 Eleonore Chiquito MD Electronically signed by Eleonore Chiquito MD Signature Date/Time: 03/10/2021/5:21:01 PM    Final     Cardiac Studies   ECHO:    1. Frequent PVCs noted.  2. Left ventricular ejection fraction, by estimation, is 55 to 60%. The  left ventricle has normal function. The left ventricle has no regional  wall motion abnormalities. Left ventricular diastolic parameters were  normal.  3. Right ventricular systolic function is normal. The right ventricular  size is normal. Tricuspid regurgitation signal is inadequate for assessing  PA pressure.  4. The mitral valve is grossly normal. Mild mitral valve regurgitation.  No evidence of mitral stenosis.  5. The aortic valve is grossly normal. Aortic valve regurgitation is not  visualized. No aortic stenosis is present.  6. The inferior vena cava is normal in size with greater than 50%  respiratory variability, suggesting right atrial pressure of 3 mmHg.   Patient  Profile     80 y.o. 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   Assessment & Plan    CHEST PAIN:  Negative trop.  Not thought to be cardiac.    GI left recommendations.   EF OK and no wall motion abnormalities on echo.  No further cardiac work up is planned   PVCs:  Metoprolol added.  Mag and potassium are normal.  Continue current therapy.  Ventricular bigeminy but without symptoms and with NL wall motion and electrolytes does not require further evaluation.     HTN:    BP is down slightly.  Continue current therapy.       DYSLIPIDEMIA:  Lipids at target.  Continue Crestor.       For questions  or updates, please contact [place] Please consult [email or web address] for contact info under Cardiology/STEMI.   Signed, Minus Breeding, MD  03/11/2021, 7:52 AM

## 2021-03-11 NOTE — Progress Notes (Addendum)
PROGRESS NOTE    [name]  YIF:[id number] DOB: June 18, 1941 DOA: 03/08/2021 PCP: Harlan Stains, MD    Brief Narrative:  Mr. Appelgetwas admitted to the hospital with a working diagnosis of atypical chest pain.  80 year old male past medical history for coronary artery disease, history of pulmonary embolism, hypertension, and SVT who presented with chest pain. Reported substernal chest pain, dull/pressure in nature, with no radiation. Associated with dyspnea and diaphoresis. Pain seems to be worse with hiccups that he has been experiencing more frequently. On his initial physical examination blood pressure 137/75, heart rate 91, respiratory rate 20, oxygen saturation 92%. His lungs were clear to auscultation bilaterally, heart S1-S2, present rhythmic, soft abdomen, no lower extremity edema.  Sodium 143, potassium 4.2, chloride 107, bicarb 27, glucose 136,BUN 19, creatinine 1.35, high sensitive troponin 8-6-9, white count 10.8, hemoglobin 16.9, hematocrit 49.6, platelets 221. SARS COVID-19 negative.  Chest radiograph no infiltrates. CT chest negative for pulm embolism, no infiltrates. Positive cholelithiasis.  EKG 108 bpm, left axis deviation, normal intervals, sinus rhythm with PVCs, no ST segment or T wave changes.  Patient placed on lorazepam with mild improvement, but continue to have significant hiccups.  He has developed encephalopathy and somnolence, sustained a mechanical fall last night.    Assessment & Plan:   Principal Problem:   Chest pain Active Problems:   Essential hypertension   CAD S/P percutaneous coronary angioplasty   History of pulmonary embolism   Hiccups    1. Atypical chest pain, in the setting of intractable hiccups/ anxiety . Patient ruled out for acute coronary syndrome. Hiccups seem to trigger chest discomfort, plus dyspnea.    2. Hiccups intractable Continue to have hiccups.  Will decrease clonazepam to 0,5 mg due to  encephalpathy, will continue with mirtazapine and paroxetine. Further work up with US abdomen.  Pantoprazole has been increased to bid.    2. Hx of Pulmonary embolism. holding apixaban for now due to fall and scalp injury.   3, New metabolic/ toxic encephalopathy. Patient with somnolence, positive mechanical fall last night with head laceration.  Will decrease clonazepam to 0,5 mg po bid. Further work up with head CT and continue neuro checks. Hold on anticoagulation for now.    Add IV fluids.   3. CAD/ PVC/ SVT. dyslipidemia. . Continue with statin therapy. Place on metoprolol for premature ventricular complexes per cardiology recommendations.  Continue with amiodarone.   4. HTN. Continue with irbesartan for blood pressure control.    5. Gout. Continue with allopurinol, no signs of acute exacerbation   6. CKD stage 3a.  his renal function has been stable, follow up renal function in am.    Patient continue to be at high risk for worsening encephalopathy   Status is: Observation  The patient will require care spanning > 2 midnights and should be moved to inpatient because: Inpatient level of care appropriate due to severity of illness  Dispo: The patient is from: Home              Anticipated d/c is to: Home              Patient currently is not medically stable to d/c.   Difficult to place patient No   DVT prophylaxis: apixaban   Code Status:   full  Family Communication:  I spoke with patient's wife at the bedside, we talked in detail about patient's condition, plan of care and prognosis and all questions were addressed.    Consultants:  GI   Cardiology     Subjective: Patient now somnolent and hyporeactive, positive fall last night, no nausea or vomiting, continue to have hiccups.   Objective: Vitals:   03/11/21 0602 03/11/21 0801 03/11/21 0840 03/11/21 1024  BP: (!) 154/77 137/82  (!) 147/59  Pulse: 78 78 73 74  Resp: 18 13  16   Temp: 97.9 F (36.6  C) (!) 97.3 F (36.3 C)  (!) 97.5 F (36.4 C)  TempSrc: Oral Oral  Oral  SpO2: 99% 98%  98%  Weight:      Height:        Intake/Output Summary (Last 24 hours) at 03/11/2021 1149 Last data filed at 03/10/2021 1810 Gross per 24 hour  Intake 720 ml  Output --  Net 720 ml   Filed Weights   03/08/21 1718  Weight: 105.2 kg    Examination:   General: Not in pain or dyspnea, deconditioned and ill looking appearing   Neurology: somnolent but easy to arouse, follows commands.  E ENT: mild pallor, no icterus, oral mucosa moist Cardiovascular: No JVD. S1-S2 present, rhythmic, no gallops, rubs, or murmurs. No lower extremity edema. Pulmonary: positive breath sounds bilaterally, adequate air movement, no wheezing, rhonchi or rales. Gastrointestinal. Abdomen soft and non tender Skin. Laceration not bleeding at the scalp and forehead.  Musculoskeletal: no joint deformities     Data Reviewed: I have personally reviewed following labs and imaging studies  CBC: Recent Labs  Lab 03/08/21 1746  WBC 10.8*  NEUTROABS 9.6*  HGB 16.9  HCT 49.6  MCV 97.8  PLT 010   Basic Metabolic Panel: Recent Labs  Lab 03/08/21 1746 03/10/21 1423  NA 143  --   K 4.2  --   CL 107  --   CO2 27  --   GLUCOSE 136*  --   BUN 19  --   CREATININE 1.35*  --   CALCIUM 8.9  --   MG  --  2.1   GFR: Estimated Creatinine Clearance: 57.4 mL/min (A) (by C-G formula based on SCr of 1.35 mg/dL (H)). Liver Function Tests: Recent Labs  Lab 03/08/21 1746  AST 27  ALT 25  ALKPHOS 49  BILITOT 0.8  PROT 7.4  ALBUMIN 4.3   No results for input(s): LIPASE, AMYLASE in the last 168 hours. No results for input(s): AMMONIA in the last 168 hours. Coagulation Profile: No results for input(s): INR, PROTIME in the last 168 hours. Cardiac Enzymes: No results for input(s): CKTOTAL, CKMB, CKMBINDEX, TROPONINI in the last 168 hours. BNP (last 3 results) No results for input(s): PROBNP in the last 8760  hours. HbA1C: No results for input(s): HGBA1C in the last 72 hours. CBG: No results for input(s): GLUCAP in the last 168 hours. Lipid Profile: Recent Labs    03/08/21 1746 03/09/21 0856  CHOL 102 98  HDL 40* 35*  LDLCALC 45 49  TRIG 83 71  CHOLHDL 2.6 2.8   Thyroid Function Tests: No results for input(s): TSH, T4TOTAL, FREET4, T3FREE, THYROIDAB in the last 72 hours. Anemia Panel: No results for input(s): VITAMINB12, FOLATE, FERRITIN, TIBC, IRON, RETICCTPCT in the last 72 hours.    Radiology Studies: I have reviewed all of the imaging during this hospital visit personally     Scheduled Meds: . allopurinol  300 mg Oral Daily  . amiodarone  100 mg Oral Daily  . clonazePAM  0.5 mg Oral BID  . irbesartan  150 mg Oral Daily  . melatonin  10 mg  Oral QHS  . metoprolol tartrate  25 mg Oral BID  . mirtazapine  7.5 mg Oral QHS  . pantoprazole  40 mg Oral BID  . PARoxetine  10 mg Oral Daily  . PARoxetine  20 mg Oral QHS  . rosuvastatin  10 mg Oral Daily   Continuous Infusions:   LOS: 0 days        Adianna Darwin Gerome Apley, MD

## 2021-03-11 NOTE — Plan of Care (Signed)

## 2021-03-11 NOTE — Progress Notes (Signed)
[facility] Progress Note  CC:  Atypical CP/epigastric pain and hiccups  Subjective:  With hiccups again today.  Wife at bedside.  Says that he has a lot of anxiety and that is why he did not do the procedures previously.  Waiting to have ultrasound performed.  Objective:  Vital signs in last 24 hours: Temp:  [97.3 F (36.3 C)-99.1 F (37.3 C)] 97.3 F (36.3 C) (05/07 0801) Pulse Rate:  [44-101] 73 (05/07 0840) Resp:  [13-20] 13 (05/07 0801) BP: (119-184)/(67-100) 137/82 (05/07 0801) SpO2:  [95 %-99 %] 98 % (05/07 0801) Last BM Date: 03/08/21 General:  Alert, Well-developed, in NAD Heart:  Regular rate and rhythm; no murmurs Pulm:  CTAB.  No W/R/R. Abdomen:  Soft, non-distended.  BS present.  Mild epigastric TTP. Extremities:  Without edema. Neurologic:  Alert and oriented x 4;  grossly normal neurologically. Psych:  Alert and cooperative. Normal mood and affect.  Intake/Output from previous day: 05/06 0701 - 05/07 0700 In: 960 [P.O.:960] Out: -   Lab Results: Recent Labs    03/08/21 1746  WBC 10.8*  HGB 16.9  HCT 49.6  PLT 221   BMET Recent Labs    03/08/21 1746  NA 143  K 4.2  CL 107  CO2 27  GLUCOSE 136*  BUN 19  CREATININE 1.35*  CALCIUM 8.9   LFT Recent Labs    03/08/21 1746  PROT 7.4  ALBUMIN 4.3  AST 27  ALT 25  ALKPHOS 49  BILITOT 0.8  BILIDIR 0.2  IBILI 0.6   ECHOCARDIOGRAM COMPLETE  Result Date: 03/10/2021    ECHOCARDIOGRAM REPORT   Patient Name:   [name] Date of Exam: 03/10/2021 Medical Rec #:  [id number]        Height:       72.0 in Accession #:    [id number]       Weight:       232.0 lb Date of Birth:  18-Dec-1940       BSA:          2.269 m Patient Age:    80 years         BP:           166/98 mmHg Patient Gender: M                HR:           76 bpm. Exam Location:  Inpatient Procedure: 2D Echo, Cardiac Doppler, Color Doppler and Intracardiac            Opacification Agent Indications:    R07.9* Chest pain,  unspecified  History:        Patient has prior history of Echocardiogram examinations, most                 recent 08/17/2020.  Sonographer:    Merrie Roof Referring Phys: [place] Comments: No parasternal window. IMPRESSIONS  1. Frequent PVCs noted.  2. Left ventricular ejection fraction, by estimation, is 55 to 60%. The left ventricle has normal function. The left ventricle has no regional wall motion abnormalities. Left ventricular diastolic parameters were normal.  3. Right ventricular systolic function is normal. The right ventricular size is normal. Tricuspid regurgitation signal is inadequate for assessing PA pressure.  4. The mitral valve is grossly normal. Mild mitral valve regurgitation. No evidence of mitral stenosis.  5. The aortic valve is grossly normal. Aortic valve regurgitation is not visualized.  No aortic stenosis is present.  6. The inferior vena cava is normal in size with greater than 50% respiratory variability, suggesting right atrial pressure of 3 mmHg. Comparison(s): No significant change from prior study. FINDINGS  Left Ventricle: Left ventricular ejection fraction, by estimation, is 55 to 60%. The left ventricle has normal function. The left ventricle has no regional wall motion abnormalities. Definity contrast agent was given IV to delineate the left ventricular  endocardial borders. The left ventricular internal cavity size was normal in size. There is no left ventricular hypertrophy. Left ventricular diastolic parameters were normal. Right Ventricle: The right ventricular size is normal. No increase in right ventricular wall thickness. Right ventricular systolic function is normal. Tricuspid regurgitation signal is inadequate for assessing PA pressure. Left Atrium: Left atrial size was normal in size. Right Atrium: Right atrial size was normal in size. Pericardium: Trivial pericardial effusion is present. Mitral Valve: The mitral valve is grossly normal. Mild  mitral valve regurgitation. No evidence of mitral valve stenosis. Tricuspid Valve: The tricuspid valve is grossly normal. Tricuspid valve regurgitation is not demonstrated. No evidence of tricuspid stenosis. Aortic Valve: The aortic valve is grossly normal. Aortic valve regurgitation is not visualized. No aortic stenosis is present. Aortic valve mean gradient measures 3.0 mmHg. Aortic valve peak gradient measures 5.7 mmHg. Aortic valve area, by VTI measures 3.76 cm. Pulmonic Valve: The pulmonic valve was not well visualized. Aorta: The aortic root is normal in size and structure. Venous: The inferior vena cava is normal in size with greater than 50% respiratory variability, suggesting right atrial pressure of 3 mmHg. IAS/Shunts: The atrial septum is grossly normal.  LEFT VENTRICLE PLAX 2D LVOT diam:     2.40 cm  Diastology LV SV:         95       LV e' medial:    8.05 cm/s LV SV Index:   42       LV E/e' medial:  10.5 LVOT Area:     4.52 cm LV e' lateral:   10.30 cm/s                         LV E/e' lateral: 8.2  RIGHT VENTRICLE RV Basal diam:  4.49 cm RV Mid diam:    3.71 cm RV S prime:     11.10 cm/s TAPSE (M-mode): 1.5 cm LEFT ATRIUM              Index       RIGHT ATRIUM           Index LA Vol (A2C):   92.1 ml  40.59 ml/m RA Area:     19.10 cm LA Vol (A4C):   102.0 ml 44.95 ml/m RA Volume:   60.00 ml  26.44 ml/m LA Biplane Vol: 107.0 ml 47.15 ml/m  AORTIC VALVE AV Area (Vmax):    3.62 cm AV Area (Vmean):   3.47 cm AV Area (VTI):     3.76 cm AV Vmax:           119.00 cm/s AV Vmean:          86.900 cm/s AV VTI:            0.254 m AV Peak Grad:      5.7 mmHg AV Mean Grad:      3.0 mmHg LVOT Vmax:         95.30 cm/s LVOT Vmean:        66.700  cm/s LVOT VTI:          0.211 m LVOT/AV VTI ratio: 0.83 MITRAL VALVE MV Area (PHT): 3.60 cm     SHUNTS MV Decel Time: 211 msec     Systemic VTI:  0.21 m MV E velocity: 84.40 cm/s   Systemic Diam: 2.40 cm MV A velocity: 112.00 cm/s MV E/A ratio:  0.75 Eleonore Chiquito MD  Electronically signed by Eleonore Chiquito MD Signature Date/Time: 03/10/2021/5:21:01 PM    Final    Assessment / Plan: *Chest pain, atypical and associated with hiccups:  Cardio feels not cardiac related.  ECHO pending.  This is actually not chest pain, but epigastric pain.  He has a ventral hernia/diastasis recti and thinks that his symptoms are related to that.  He does not think it is acid related, etc.  At the time of my visit he is not interested in EGD. *History of PE on Eliquis *Family history of colon cancer in his mother: Patient's last colonoscopy was in March 2010 at which time he had a tubular adenoma removed from the cecum.  He was scheduled for colonoscopy when he saw me in October 2021, but canceled the procedure shortly after that time.  -Ultrasound pending. -Pantoprazole 40 mg BID. -Valium for hiccups as currently ordered or thorazine if needed. -If EGD were to be performed it would be Sunday at the earliest as there is no availability tomorrow in endo and he would need Eliquis to be held 24-48 hours as well. -Will need outpatient colonoscopy in the near future.   LOS: 0 days   Laban Emperor. Felipa Laroche  03/11/2021, 9:09 AM

## 2021-03-12 DIAGNOSIS — R0789 Other chest pain: Secondary | ICD-10-CM

## 2021-03-12 DIAGNOSIS — I2510 Atherosclerotic heart disease of native coronary artery without angina pectoris: Secondary | ICD-10-CM | POA: Diagnosis not present

## 2021-03-12 DIAGNOSIS — R1013 Epigastric pain: Secondary | ICD-10-CM

## 2021-03-12 DIAGNOSIS — R066 Hiccough: Principal | ICD-10-CM

## 2021-03-12 DIAGNOSIS — G9340 Encephalopathy, unspecified: Secondary | ICD-10-CM | POA: Diagnosis not present

## 2021-03-12 DIAGNOSIS — I10 Essential (primary) hypertension: Secondary | ICD-10-CM | POA: Diagnosis not present

## 2021-03-12 LAB — BASIC METABOLIC PANEL
Anion gap: 5 (ref 5–15)
BUN: 23 mg/dL (ref 8–23)
CO2: 29 mmol/L (ref 22–32)
Calcium: 8.3 mg/dL — ABNORMAL LOW (ref 8.9–10.3)
Chloride: 106 mmol/L (ref 98–111)
Creatinine, Ser: 1.47 mg/dL — ABNORMAL HIGH (ref 0.61–1.24)
GFR, Estimated: 49 mL/min — ABNORMAL LOW (ref >60)
Glucose, Bld: 114 mg/dL — ABNORMAL HIGH (ref 70–99)
Potassium: 4 mmol/L (ref 3.5–5.1)
Sodium: 140 mmol/L (ref 135–145)

## 2021-03-12 LAB — CBC WITH DIFFERENTIAL/PLATELET
Abs Immature Granulocytes: 0.07 10*3/uL (ref 0.00–0.07)
Basophils Absolute: 0 10*3/uL (ref 0.0–0.1)
Basophils Relative: 1 %
Eosinophils Absolute: 0.1 10*3/uL (ref 0.0–0.5)
Eosinophils Relative: 1 %
HCT: 52 % (ref 39.0–52.0)
Hemoglobin: 17.3 g/dL — ABNORMAL HIGH (ref 13.0–17.0)
Immature Granulocytes: 1 %
Lymphocytes Relative: 18 %
Lymphs Abs: 1.5 10*3/uL (ref 0.7–4.0)
MCH: 33.1 pg (ref 26.0–34.0)
MCHC: 33.3 g/dL (ref 30.0–36.0)
MCV: 99.6 fL (ref 80.0–100.0)
Monocytes Absolute: 0.8 10*3/uL (ref 0.1–1.0)
Monocytes Relative: 9 %
Neutro Abs: 6.1 10*3/uL (ref 1.7–7.7)
Neutrophils Relative %: 70 %
Platelets: 230 10*3/uL (ref 150–400)
RBC: 5.22 MIL/uL (ref 4.22–5.81)
RDW: 13.5 % (ref 11.5–15.5)
WBC: 8.7 10*3/uL (ref 4.0–10.5)
nRBC: 0 % (ref 0.0–0.2)

## 2021-03-12 MED ORDER — LORAZEPAM 0.5 MG PO TABS
0.5000 mg | ORAL_TABLET | Freq: Four times a day (QID) | ORAL | 0 refills | Status: AC | PRN
Start: 2021-03-12 — End: ?

## 2021-03-12 MED ORDER — PANTOPRAZOLE SODIUM 40 MG PO TBEC: 40.0000 mg | DELAYED_RELEASE_TABLET | Freq: Two times a day (BID) | ORAL | 0 refills | Status: DC

## 2021-03-12 MED ORDER — APIXABAN 5 MG PO TABS: 5.0000 mg | ORAL_TABLET | Freq: Two times a day (BID) | ORAL | Status: DC

## 2021-03-12 MED ORDER — METOPROLOL TARTRATE 25 MG PO TABS: 25.0000 mg | ORAL_TABLET | Freq: Two times a day (BID) | ORAL | 0 refills | Status: DC

## 2021-03-12 MED ORDER — ALBUTEROL SULFATE (2.5 MG/3ML) 0.083% IN NEBU: 2.5000 mg | INHALATION_SOLUTION | Freq: Four times a day (QID) | RESPIRATORY_TRACT | 0 refills | Status: AC | PRN

## 2021-03-12 NOTE — Progress Notes (Signed)
Assumed care of patient from previous RN. Agree with previous RN assessment. Will continue to monitor patient.

## 2021-03-12 NOTE — H&P (View-Only) (Signed)
Attending physician's note   I have taken an interval history, reviewed the chart and examined the patient. I agree with the Advanced Practitioner's note, impression, and recommendations as outlined.   -Plan for EGD tomorrow with Dr. Nandigam  Joseph Dakin, DO, FACG [phone] office              [facility] Progress Note  CC:  Atypical chest pain/epigastric pain and hiccups  Subjective:  Wife says that hiccups have improved but he is still having hiccups while I was seeing him.  Has episodes where he is hiccupping and gets spasms.    Ultrasound showed the following:  1. Mild to moderate cholelithiasis. No additional sonographic evidence to suggest acute cholecystitis.  2.  1 cm liver cyst.  Mild hepatic steatosis.  Objective:  Vital signs in last 24 hours: Temp:  [97.5 F (36.4 C)-98.1 F (36.7 C)] 97.7 F (36.5 C) (05/08 0457) Pulse Rate:  [64-85] 64 (05/08 0457) Resp:  [14-20] 20 (05/08 0457) BP: (109-171)/(58-91) 147/84 (05/08 0457) SpO2:  [96 %-98 %] 97 % (05/08 0457) Last BM Date: 03/08/21 General:  Alert, Well-developed, in NAD Heart:  Regular rate and rhythm; no murmurs Pulm:  CTAB.  No W/R/R. Abdomen:  Soft, non-distended.  BS present.  Mild epigastric TTP. Extremities:  Without edema. Neurologic:  Alert and oriented x 4;  grossly normal neurologically.  Intake/Output from previous day: 05/07 0701 - 05/08 0700 In: 1963.7 [P.O.:1200; I.V.:763.7] Out: 1350 [Urine:1350]  Lab Results: Recent Labs    03/12/21 0440  WBC 8.7  HGB 17.3*  HCT 52.0  PLT 230   BMET Recent Labs    03/12/21 0440  NA 140  K 4.0  CL 106  CO2 29  GLUCOSE 114*  BUN 23  CREATININE 1.47*  CALCIUM 8.3*   CT HEAD WO CONTRAST  Result Date: 03/11/2021 CLINICAL DATA:  Mental status changes. Unknown cause. Fall. Laceration. EXAM: CT HEAD WITHOUT CONTRAST TECHNIQUE: Contiguous axial images were obtained from the base of the skull through the vertex  without intravenous contrast. COMPARISON:  MR head 12/14/2020.  CT head 11/22/2015 FINDINGS: Brain: Mild atrophy and white matter changes are again seen. No acute infarct, hemorrhage, or mass lesion is present. The ventricles are of normal size. No significant extraaxial fluid collection is present. The brainstem and cerebellum are within normal limits. Vascular: Atherosclerotic calcifications are present within the cavernous internal carotid arteries. No hyperdense vessel is present. Skull: No significant soft tissue injury is present. Calvarium is intact. Sinuses/Orbits: The paranasal sinuses and mastoid air cells are clear. The globes and orbits are within normal limits. IMPRESSION: 1. No acute intracranial abnormality or significant interval change. 2. Stable atrophy and white matter disease. This likely reflects the sequela of chronic microvascular ischemia. Electronically Signed   By: Joseph  Browning M.D.   On: 03/11/2021 13:26   US Abdomen Complete  Result Date: 03/11/2021 CLINICAL DATA:  Epigastric pain 1 week. EXAM: ABDOMEN ULTRASOUND COMPLETE COMPARISON:  CT 02/20/2012 FINDINGS: Gallbladder: Mild to moderate cholelithiasis with largest stone measuring 8 mm. No evidence of gallbladder wall thickening or adjacent free fluid. Negative sonographic Murphy sign. Common bile duct: Diameter: 3 mm. Liver: 1 cm cyst over the left lobe. Mild increased parenchymal echogenicity likely representing a degree of hepatic steatosis. Portal vein is patent on color Doppler imaging with normal direction of blood flow towards the liver. IVC: No abnormality visualized. Pancreas: Visualized portion unremarkable. Spleen: Mild splenic enlargement with volume 595 cubic cm and greatest diameter   13 cm. Right Kidney: Length: 12.7 cm. Echogenicity within normal limits. No mass or hydronephrosis visualized. 1 cm cyst. Left Kidney: Length: Not visualized. Moderately atrophic on previous CT. Abdominal aorta: No aneurysm visualized.  Other findings: Mild to moderate prostatic enlargement. IMPRESSION: 1. Mild to moderate cholelithiasis. No additional sonographic evidence to suggest acute cholecystitis. 2.  1 cm liver cyst.  Mild hepatic steatosis. 3. 1 cm right renal cyst. Nonvisualization of known atrophic left kidney. 4.  Prostatic enlargement. Electronically Signed   By: [name] M.D.   On: 03/11/2021 10:35   ECHOCARDIOGRAM COMPLETE  Result Date: 03/10/2021    ECHOCARDIOGRAM REPORT   Patient Name:   [name] Date of Exam: 03/10/2021 Medical Rec #:  [id number]        Height:       72.0 in Accession #:    [id number]       Weight:       232.0 lb Date of Birth:  Feb 01, 1941       BSA:          2.269 m Patient Age:    80 years         BP:           166/98 mmHg Patient Gender: M                HR:           76 bpm. Exam Location:  Inpatient Procedure: 2D Echo, Cardiac Doppler, Color Doppler and Intracardiac            Opacification Agent Indications:    R07.9* Chest pain, unspecified  History:        Patient has prior history of Echocardiogram examinations, most                 recent 08/17/2020.  Sonographer:    Merrie Roof Referring Phys: [place] Comments: No parasternal window. IMPRESSIONS  1. Frequent PVCs noted.  2. Left ventricular ejection fraction, by estimation, is 55 to 60%. The left ventricle has normal function. The left ventricle has no regional wall motion abnormalities. Left ventricular diastolic parameters were normal.  3. Right ventricular systolic function is normal. The right ventricular size is normal. Tricuspid regurgitation signal is inadequate for assessing PA pressure.  4. The mitral valve is grossly normal. Mild mitral valve regurgitation. No evidence of mitral stenosis.  5. The aortic valve is grossly normal. Aortic valve regurgitation is not visualized. No aortic stenosis is present.  6. The inferior vena cava is normal in size with greater than 50% respiratory variability, suggesting  right atrial pressure of 3 mmHg. Comparison(s): No significant change from prior study. FINDINGS  Left Ventricle: Left ventricular ejection fraction, by estimation, is 55 to 60%. The left ventricle has normal function. The left ventricle has no regional wall motion abnormalities. Definity contrast agent was given IV to delineate the left ventricular  endocardial borders. The left ventricular internal cavity size was normal in size. There is no left ventricular hypertrophy. Left ventricular diastolic parameters were normal. Right Ventricle: The right ventricular size is normal. No increase in right ventricular wall thickness. Right ventricular systolic function is normal. Tricuspid regurgitation signal is inadequate for assessing PA pressure. Left Atrium: Left atrial size was normal in size. Right Atrium: Right atrial size was normal in size. Pericardium: Trivial pericardial effusion is present. Mitral Valve: The mitral valve is grossly normal. Mild mitral valve regurgitation. No evidence of mitral valve stenosis.  Tricuspid Valve: The tricuspid valve is grossly normal. Tricuspid valve regurgitation is not demonstrated. No evidence of tricuspid stenosis. Aortic Valve: The aortic valve is grossly normal. Aortic valve regurgitation is not visualized. No aortic stenosis is present. Aortic valve mean gradient measures 3.0 mmHg. Aortic valve peak gradient measures 5.7 mmHg. Aortic valve area, by VTI measures 3.76 cm. Pulmonic Valve: The pulmonic valve was not well visualized. Aorta: The aortic root is normal in size and structure. Venous: The inferior vena cava is normal in size with greater than 50% respiratory variability, suggesting right atrial pressure of 3 mmHg. IAS/Shunts: The atrial septum is grossly normal.  LEFT VENTRICLE PLAX 2D LVOT diam:     2.40 cm  Diastology LV SV:         95       LV e' medial:    8.05 cm/s LV SV Index:   42       LV E/e' medial:  10.5 LVOT Area:     4.52 cm LV e' lateral:   10.30 cm/s                          LV E/e' lateral: 8.2  RIGHT VENTRICLE RV Basal diam:  4.49 cm RV Mid diam:    3.71 cm RV S prime:     11.10 cm/s TAPSE (M-mode): 1.5 cm LEFT ATRIUM              Index       RIGHT ATRIUM           Index LA Vol (A2C):   92.1 ml  40.59 ml/m RA Area:     19.10 cm LA Vol (A4C):   102.0 ml 44.95 ml/m RA Volume:   60.00 ml  26.44 ml/m LA Biplane Vol: 107.0 ml 47.15 ml/m  AORTIC VALVE AV Area (Vmax):    3.62 cm AV Area (Vmean):   3.47 cm AV Area (VTI):     3.76 cm AV Vmax:           119.00 cm/s AV Vmean:          86.900 cm/s AV VTI:            0.254 m AV Peak Grad:      5.7 mmHg AV Mean Grad:      3.0 mmHg LVOT Vmax:         95.30 cm/s LVOT Vmean:        66.700 cm/s LVOT VTI:          0.211 m LVOT/AV VTI ratio: 0.83 MITRAL VALVE MV Area (PHT): 3.60 cm     SHUNTS MV Decel Time: 211 msec     Systemic VTI:  0.21 m MV E velocity: 84.40 cm/s   Systemic Diam: 2.40 cm MV A velocity: 112.00 cm/s MV E/A ratio:  0.75 Eleonore Chiquito MD Electronically signed by Eleonore Chiquito MD Signature Date/Time: 03/10/2021/5:21:01 PM    Final    Assessment / Plan: *Chest pain, atypical and associated with hiccups: Cardio feels not cardiac related. ECHO pending. This is actually not chest pain, but epigastric pain. He has a ventral hernia/diastasis recti and thinks that his symptoms are related to that. He does not think it is acid related, etc.  Ultrasound showed cholelithiasis but no cholecystitis, hepatic steatosis and 1 cm liver cyst. *History of PE on Eliquis:  Last dose was 5/6 AM. *Family history of colon cancer in his mother:Patient's last colonoscopy was in  March 2010 at which time he had a tubular adenoma removed from the cecum. He was scheduled for colonoscopy when he saw me in October 2021, but canceled the procedure shortly after thattime.  -Pantoprazole 40 mg BID. -Valium for hiccups as currently ordered or thorazine if needed. -His wife would like him to have EGD while he is here.  Patient  is agreeable to do it tomorrow, 5/9. -Will need outpatient colonoscopy in the near future.   LOS: 1 day   Laban Emperor. Zehr  03/12/2021, 9:31 AM

## 2021-03-12 NOTE — Progress Notes (Addendum)
Attending physician's note   I have taken an interval history, reviewed the chart and examined the patient. I agree with the Advanced Practitioner's note, impression, and recommendations as outlined.   -Plan for EGD tomorrow with Dr. Christin Bach, DO, FACG [phone] office              [facility] Progress Note  CC:  Atypical chest pain/epigastric pain and hiccups  Subjective:  Wife says that hiccups have improved but he is still having hiccups while I was seeing him.  Has episodes where he is hiccupping and gets spasms.    Ultrasound showed the following:  1. Mild to moderate cholelithiasis. No additional sonographic evidence to suggest acute cholecystitis.  2.  1 cm liver cyst.  Mild hepatic steatosis.  Objective:  Vital signs in last 24 hours: Temp:  [97.5 F (36.4 C)-98.1 F (36.7 C)] 97.7 F (36.5 C) (05/08 0457) Pulse Rate:  [64-85] 64 (05/08 0457) Resp:  [14-20] 20 (05/08 0457) BP: (109-171)/(58-91) 147/84 (05/08 0457) SpO2:  [96 %-98 %] 97 % (05/08 0457) Last BM Date: 03/08/21 General:  Alert, Well-developed, in NAD Heart:  Regular rate and rhythm; no murmurs Pulm:  CTAB.  No W/R/R. Abdomen:  Soft, non-distended.  BS present.  Mild epigastric TTP. Extremities:  Without edema. Neurologic:  Alert and oriented x 4;  grossly normal neurologically.  Intake/Output from previous day: 05/07 0701 - 05/08 0700 In: 1963.7 [P.O.:1200; I.V.:763.7] Out: 1350 [Urine:1350]  Lab Results: Recent Labs    03/12/21 0440  WBC 8.7  HGB 17.3*  HCT 52.0  PLT 230   BMET Recent Labs    03/12/21 0440  NA 140  K 4.0  CL 106  CO2 29  GLUCOSE 114*  BUN 23  CREATININE 1.47*  CALCIUM 8.3*   CT HEAD WO CONTRAST  Result Date: 03/11/2021 CLINICAL DATA:  Mental status changes. Unknown cause. Fall. Laceration. EXAM: CT HEAD WITHOUT CONTRAST TECHNIQUE: Contiguous axial images were obtained from the base of the skull through the vertex  without intravenous contrast. COMPARISON:  MR head 12/14/2020.  CT head 11/22/2015 FINDINGS: Brain: Mild atrophy and white matter changes are again seen. No acute infarct, hemorrhage, or mass lesion is present. The ventricles are of normal size. No significant extraaxial fluid collection is present. The brainstem and cerebellum are within normal limits. Vascular: Atherosclerotic calcifications are present within the cavernous internal carotid arteries. No hyperdense vessel is present. Skull: No significant soft tissue injury is present. Calvarium is intact. Sinuses/Orbits: The paranasal sinuses and mastoid air cells are clear. The globes and orbits are within normal limits. IMPRESSION: 1. No acute intracranial abnormality or significant interval change. 2. Stable atrophy and white matter disease. This likely reflects the sequela of chronic microvascular ischemia. Electronically Signed   By: San Morelle M.D.   On: 03/11/2021 13:26   US Abdomen Complete  Result Date: 03/11/2021 CLINICAL DATA:  Epigastric pain 1 week. EXAM: ABDOMEN ULTRASOUND COMPLETE COMPARISON:  CT 02/20/2012 FINDINGS: Gallbladder: Mild to moderate cholelithiasis with largest stone measuring 8 mm. No evidence of gallbladder wall thickening or adjacent free fluid. Negative sonographic Murphy sign. Common bile duct: Diameter: 3 mm. Liver: 1 cm cyst over the left lobe. Mild increased parenchymal echogenicity likely representing a degree of hepatic steatosis. Portal vein is patent on color Doppler imaging with normal direction of blood flow towards the liver. IVC: No abnormality visualized. Pancreas: Visualized portion unremarkable. Spleen: Mild splenic enlargement with volume 595 cubic cm and greatest diameter  13 cm. Right Kidney: Length: 12.7 cm. Echogenicity within normal limits. No mass or hydronephrosis visualized. 1 cm cyst. Left Kidney: Length: Not visualized. Moderately atrophic on previous CT. Abdominal aorta: No aneurysm visualized.  Other findings: Mild to moderate prostatic enlargement. IMPRESSION: 1. Mild to moderate cholelithiasis. No additional sonographic evidence to suggest acute cholecystitis. 2.  1 cm liver cyst.  Mild hepatic steatosis. 3. 1 cm right renal cyst. Nonvisualization of known atrophic left kidney. 4.  Prostatic enlargement. Electronically Signed   By: Marin Olp M.D.   On: 03/11/2021 10:35   ECHOCARDIOGRAM COMPLETE  Result Date: 03/10/2021    ECHOCARDIOGRAM REPORT   Patient Name:   JOHSUA SHEVLIN Mahnke Date of Exam: 03/10/2021 Medical Rec #:  [id number]        Height:       72.0 in Accession #:    [id number]       Weight:       232.0 lb Date of Birth:  Feb 01, 1941       BSA:          2.269 m Patient Age:    80 years         BP:           166/98 mmHg Patient Gender: M                HR:           76 bpm. Exam Location:  Inpatient Procedure: 2D Echo, Cardiac Doppler, Color Doppler and Intracardiac            Opacification Agent Indications:    R07.9* Chest pain, unspecified  History:        Patient has prior history of Echocardiogram examinations, most                 recent 08/17/2020.  Sonographer:    Merrie Roof Referring Phys: [place] Comments: No parasternal window. IMPRESSIONS  1. Frequent PVCs noted.  2. Left ventricular ejection fraction, by estimation, is 55 to 60%. The left ventricle has normal function. The left ventricle has no regional wall motion abnormalities. Left ventricular diastolic parameters were normal.  3. Right ventricular systolic function is normal. The right ventricular size is normal. Tricuspid regurgitation signal is inadequate for assessing PA pressure.  4. The mitral valve is grossly normal. Mild mitral valve regurgitation. No evidence of mitral stenosis.  5. The aortic valve is grossly normal. Aortic valve regurgitation is not visualized. No aortic stenosis is present.  6. The inferior vena cava is normal in size with greater than 50% respiratory variability, suggesting  right atrial pressure of 3 mmHg. Comparison(s): No significant change from prior study. FINDINGS  Left Ventricle: Left ventricular ejection fraction, by estimation, is 55 to 60%. The left ventricle has normal function. The left ventricle has no regional wall motion abnormalities. Definity contrast agent was given IV to delineate the left ventricular  endocardial borders. The left ventricular internal cavity size was normal in size. There is no left ventricular hypertrophy. Left ventricular diastolic parameters were normal. Right Ventricle: The right ventricular size is normal. No increase in right ventricular wall thickness. Right ventricular systolic function is normal. Tricuspid regurgitation signal is inadequate for assessing PA pressure. Left Atrium: Left atrial size was normal in size. Right Atrium: Right atrial size was normal in size. Pericardium: Trivial pericardial effusion is present. Mitral Valve: The mitral valve is grossly normal. Mild mitral valve regurgitation. No evidence of mitral valve stenosis.  Tricuspid Valve: The tricuspid valve is grossly normal. Tricuspid valve regurgitation is not demonstrated. No evidence of tricuspid stenosis. Aortic Valve: The aortic valve is grossly normal. Aortic valve regurgitation is not visualized. No aortic stenosis is present. Aortic valve mean gradient measures 3.0 mmHg. Aortic valve peak gradient measures 5.7 mmHg. Aortic valve area, by VTI measures 3.76 cm. Pulmonic Valve: The pulmonic valve was not well visualized. Aorta: The aortic root is normal in size and structure. Venous: The inferior vena cava is normal in size with greater than 50% respiratory variability, suggesting right atrial pressure of 3 mmHg. IAS/Shunts: The atrial septum is grossly normal.  LEFT VENTRICLE PLAX 2D LVOT diam:     2.40 cm  Diastology LV SV:         95       LV e' medial:    8.05 cm/s LV SV Index:   42       LV E/e' medial:  10.5 LVOT Area:     4.52 cm LV e' lateral:   10.30 cm/s                          LV E/e' lateral: 8.2  RIGHT VENTRICLE RV Basal diam:  4.49 cm RV Mid diam:    3.71 cm RV S prime:     11.10 cm/s TAPSE (M-mode): 1.5 cm LEFT ATRIUM              Index       RIGHT ATRIUM           Index LA Vol (A2C):   92.1 ml  40.59 ml/m RA Area:     19.10 cm LA Vol (A4C):   102.0 ml 44.95 ml/m RA Volume:   60.00 ml  26.44 ml/m LA Biplane Vol: 107.0 ml 47.15 ml/m  AORTIC VALVE AV Area (Vmax):    3.62 cm AV Area (Vmean):   3.47 cm AV Area (VTI):     3.76 cm AV Vmax:           119.00 cm/s AV Vmean:          86.900 cm/s AV VTI:            0.254 m AV Peak Grad:      5.7 mmHg AV Mean Grad:      3.0 mmHg LVOT Vmax:         95.30 cm/s LVOT Vmean:        66.700 cm/s LVOT VTI:          0.211 m LVOT/AV VTI ratio: 0.83 MITRAL VALVE MV Area (PHT): 3.60 cm     SHUNTS MV Decel Time: 211 msec     Systemic VTI:  0.21 m MV E velocity: 84.40 cm/s   Systemic Diam: 2.40 cm MV A velocity: 112.00 cm/s MV E/A ratio:  0.75 Eleonore Chiquito MD Electronically signed by Eleonore Chiquito MD Signature Date/Time: 03/10/2021/5:21:01 PM    Final    Assessment / Plan: *Chest pain, atypical and associated with hiccups: Cardio feels not cardiac related. ECHO pending. This is actually not chest pain, but epigastric pain. He has a ventral hernia/diastasis recti and thinks that his symptoms are related to that. He does not think it is acid related, etc.  Ultrasound showed cholelithiasis but no cholecystitis, hepatic steatosis and 1 cm liver cyst. *History of PE on Eliquis:  Last dose was 5/6 AM. *Family history of colon cancer in his mother:Patient's last colonoscopy was in  March 2010 at which time he had a tubular adenoma removed from the cecum. He was scheduled for colonoscopy when he saw me in October 2021, but canceled the procedure shortly after thattime.  -Pantoprazole 40 mg BID. -Valium for hiccups as currently ordered or thorazine if needed. -His wife would like him to have EGD while he is here.  Patient  is agreeable to do it tomorrow, 5/9. -Will need outpatient colonoscopy in the near future.   LOS: 1 day   Laban Emperor. Zehr  03/12/2021, 9:31 AM

## 2021-03-12 NOTE — Evaluation (Signed)
Physical Therapy Evaluation Patient Details Name: [name] MRN: [id number] DOB: October 25, 1941 Today's Date: 03/12/2021   History of Present Illness  80 yo male admitted with atypical chest pain-possibly epigastric in nature, fall with head laceration. Hx of PE, ventral hernia, CAD, SVT, gout, CKD, bil TKA  Clinical Impression  On eval, pt required Min guard assist for mobility. He walked ~225 feet with support of IV pole. Pt appears weaker and less steady than his baseline. Wife present during session. Discussed d/c plan-both pt and wife are agreeable to HHPT f/u.     Follow Up Recommendations Home health PT;Supervision for mobility/OOB    Equipment Recommendations  None recommended by PT    Recommendations for Other Services       Precautions / Restrictions Precautions Precautions: Fall Restrictions Weight Bearing Restrictions: No      Mobility  Bed Mobility Overal bed mobility: Modified Independent                  Transfers Overall transfer level: Needs assistance   Transfers: Sit to/from Stand Sit to Stand: Supervision            Ambulation/Gait Ambulation/Gait assistance: Min guard Gait Distance (Feet): 225 Feet Assistive device: IV Pole Gait Pattern/deviations: Step-through pattern;Decreased stride length;Decreased step length - right;Decreased step length - left     General Gait Details: Mildly unsteady at times but no LOB. guarded slow gait pattern with short step/stride lengths. no c/o pain.  Stairs            Wheelchair Mobility    Modified Rankin (Stroke Patients Only)       Balance Overall balance assessment: Needs assistance;History of Falls                                           Pertinent Vitals/Pain Pain Assessment: No/denies pain    Home Living Family/patient expects to be discharged to:: Private residence Living Arrangements: Spouse/significant other Available Help at Discharge: Family Type  of Home: House       Home Layout: Able to live on main level with bedroom/bathroom Home Equipment: Gilford Rile - 2 wheels;Cane - single point      Prior Function Level of Independence: Independent               Hand Dominance        Extremity/Trunk Assessment   Upper Extremity Assessment Upper Extremity Assessment: Overall WFL for tasks assessed    Lower Extremity Assessment Lower Extremity Assessment: Generalized weakness    Cervical / Trunk Assessment Cervical / Trunk Assessment: Normal  Communication   Communication: No difficulties  Cognition Arousal/Alertness: Awake/alert Behavior During Therapy: WFL for tasks assessed/performed Overall Cognitive Status: Within Functional Limits for tasks assessed                                 General Comments: wife states she thinks his mentation is improved today      General Comments      Exercises     Assessment/Plan    PT Assessment Patient needs continued PT services  PT Problem List Decreased strength;Decreased mobility;Decreased activity tolerance;Decreased balance;Decreased knowledge of use of DME       PT Treatment Interventions DME instruction;Gait training;Balance training;Therapeutic exercise;Functional mobility training;Therapeutic activities;Patient/family education    PT Goals (Current goals can  be found in the Care Plan section)  Acute Rehab PT Goals Patient Stated Goal: home soon PT Goal Formulation: With patient/family Time For Goal Achievement: 03/26/21 Potential to Achieve Goals: Good    Frequency Min 3X/week   Barriers to discharge        Co-evaluation               AM-PAC PT "6 Clicks" Mobility  Outcome Measure Help needed turning from your back to your side while in a flat bed without using bedrails?: None Help needed moving from lying on your back to sitting on the side of a flat bed without using bedrails?: None Help needed moving to and from a bed to a chair  (including a wheelchair)?: A Little Help needed standing up from a chair using your arms (e.g., wheelchair or bedside chair)?: A Little Help needed to walk in hospital room?: A Little Help needed climbing 3-5 steps with a railing? : A Little 6 Click Score: 20    End of Session Equipment Utilized During Treatment: Gait belt Activity Tolerance: Patient tolerated treatment well Patient left: in chair;with call bell/phone within reach;with family/visitor present   PT Visit Diagnosis: History of falling (Z91.81);Muscle weakness (generalized) (M62.81);Unsteadiness on feet (R26.81)    Time: 1212-1224 PT Time Calculation (min) (ACUTE ONLY): 12 min   Charges:   PT Evaluation $PT Eval Moderate Complexity: 1 Mod            Doreatha Massed, PT Acute Rehabilitation  Office: [phone] Pager: [phone]

## 2021-03-12 NOTE — Plan of Care (Signed)
  Problem: Education: Goal: Knowledge of General Education information will improve Description Including pain rating scale, medication(s)/side effects and non-pharmacologic comfort measures Outcome: Progressing   Problem: Activity: Goal: Risk for activity intolerance will decrease Outcome: Progressing   Problem: Safety: Goal: Ability to remain free from injury will improve Outcome: Progressing   

## 2021-03-12 NOTE — Discharge Summary (Addendum)
Physician Discharge Summary  Benecio Blood Bouwens [id number] DOB: 1941-02-28 DOA: 03/08/2021  PCP: Harlan Stains, MD  Admit date: 03/08/2021 Discharge date: 03/13/2021  Admitted From: Home  Disposition:  Home   Recommendations for Outpatient Follow-up and new medication changes:  1. Follow up with Dr, Dema Severin in 7 to 10 days.  2. Added lorazepam 0,5 mg as needed for hiccups. 3. Continue with proton pump inhibitor to pantoprazole 40 mg po daily  4. Added duoneb as needed for shortness of breath.  5. Follow up renal function in 7 to 10 days.   Home Health: yes   Equipment/Devices: na    Discharge Condition: stable  CODE STATUS: full  Diet recommendation: heart healthy   Brief/Interim Summary: Mr. Gonzalo admitted to the hospital with a working diagnosis of atypical chest pain in the setting of intractable hiccups and complicated with metabolic encephalopathy.   80 year old male past medical history for coronary artery disease, history of pulmonary embolism, hypertension, and SVT who presented with chest pain. Reported substernal chest pain, dull/pressure in nature, with no radiation. Associated with dyspnea and diaphoresis. Pain seems to be worse with hiccups that he has been experiencing more frequently. On his initial physical examination blood pressure 137/75, heart rate 91, respiratory rate 20, oxygen saturation 92%. His lungs were clear to auscultation bilaterally, heart S1-S2, present rhythmic, soft abdomen, no lower extremity edema.  Sodium 143, potassium 4.2, chloride 107, bicarb 27, glucose 136,BUN 19, creatinine 1.35, high sensitive troponin 8-6-9, white count 10.8, hemoglobin 16.9, hematocrit 49.6, platelets 221. SARS COVID-19 negative.  Chest radiograph no infiltrates. CT chest negative for pulm embolism, no infiltrates. Positive cholelithiasis.  EKG 108 bpm, left axis deviation, normal intervals, sinus rhythm with PVCs, no ST segment or T wave  changes.  Patient placed on lorazepam with mild improvement, but continue to have significant hiccups.  He developed encephalopathy and somnolence, sustained a mechanical fall with head laceration.   Slowly his symptoms have improved, plan to follow up as outpatient with psychiatry and gastroenterology.    1.  Atypical chest pain, in the setting of intractable hiccups, anxiety.  Patient ruled out for acute coronary syndrome. Further work-up with echocardiography showed preserved LV systolic function 55 to [id number], no wall motion normalities.  No valvular disease.  2.  Intractable hiccups.  Patient was placed on as needed lorazepam and scheduled clonazepam was increased to 1 mg twice daily.  Proton pump inhibitor was given, at high dose of pantoprazole 40 mg twice daily.  Further work-up with abdominal ultrasonography with no acute changes, positive cholelithiasis. Patient's symptoms slowly improving.  No nausea, vomiting or abdominal pain.  Because of somnolence, clonazepam will be reduced back to 0.5 mg twice daily and will continue taking as needed lorazepam 0.5 mg. Hold on thorazine for now.   3.  Metabolic/toxic encephalopathy.  Patient showed somnolence, likely medication induced, he sustained a mechanical fall with head laceration. Further work-up with head CT no acute changes. His anticoagulation was held for 24 hours.  Physical therapy prior to evaluation, home services will be arranged. Resume anticoagulation at discharge.  4.  Coronary artery disease, PVCs, SVT, dyslipidemia.  Telemetry showed ventricular bigeminy with no major symptoms. Metoprolol was added with good toleration.  Continue statin therapy.  5.  Hypertension.  Continue irbesartan for blood pressure control.  6.  Gout.  No acute exacerbation, continue allopurinol.  7.  Chronic kidney disease stage IIIa.  His renal function has remained stable, serum creatinine 1.35-1.47. Patient is tolerating p.o.  diet  adequately.  Follow-up kidney function as an outpatient.  8. Depression and anxiety. Continue paroxetine and mirtazapine, follow with psychiatry as outpatient as scheduled.  Will hold on thorazine for now.   9. Esophageal candidiasis. Patient underwent EGD found to have grade C candidiasis esophagitis with no bleeding. Biopsied.  Normal stomach and normal duodenum.    Discharge Diagnoses:  Principal Problem:   Atypical chest pain Active Problems:   Essential hypertension   CAD S/P percutaneous coronary angioplasty   History of pulmonary embolism   Hiccups   Encephalopathy    Discharge Instructions   Allergies as of 03/13/2021      Reactions   Garlic Anaphylaxis   Onion Anaphylaxis   Amlodipine    Budesonide-formoterol Fumarate    Seemed to make breathing worse   Buprenorphine Nausea Only   Buspar [buspirone]    Celecoxib    Codeine Other (See Comments)   "PASSES OUT"   Duloxetine    Fluticasone    Fluticasone-salmeterol    Glucosamine Forte [nutritional Supplements]    Hydrocodone    Pt states that he is allergic to synthetic hydrocodone.   Ibuprofen    Kidney problems   Lisinopril    Headaches, nausea, cough   Mometasone    ineffective   Novocain [procaine] Nausea Only   Sildenafil    Topiramate Nausea Only   Trazodone And Nefazodone    Dizzy/off balance      Medication List    STOP taking these medications   lansoprazole 15 MG capsule Commonly known as: PREVACID Replaced by: pantoprazole 40 MG tablet   methocarbamol 750 MG tablet Commonly known as: ROBAXIN   ondansetron 4 MG tablet Commonly known as: ZOFRAN     TAKE these medications   acetaminophen 500 MG tablet Commonly known as: TYLENOL Take 1,000 mg by mouth every 6 (six) hours as needed.   albuterol (2.5 MG/3ML) 0.083% nebulizer solution Commonly known as: PROVENTIL Take 3 mLs (2.5 mg total) by nebulization every 6 (six) hours as needed for up to 15 days for wheezing or shortness of  breath.   allopurinol 300 MG tablet Commonly known as: ZYLOPRIM Take 300 mg by mouth daily.   amiodarone 100 MG tablet Commonly known as: PACERONE Take 1 tablet (100 mg total) by mouth daily.   apixaban 5 MG Tabs tablet Commonly known as: ELIQUIS Take 5 mg by mouth 2 (two) times daily.   baclofen 10 MG tablet Commonly known as: LIORESAL Take 10 mg by mouth 2 (two) times daily.   clonazePAM 0.5 MG tablet Commonly known as: KLONOPIN Take 0.5 mg by mouth daily.   cyclobenzaprine 5 MG tablet Commonly known as: FLEXERIL Take 1 tablet (5 mg total) by mouth 3 (three) times daily as needed for muscle spasms.   fluconazole 100 MG tablet Commonly known as: DIFLUCAN Take 1 tablet (100 mg total) by mouth daily.   fluticasone 50 MCG/ACT nasal spray Commonly known as: FLONASE Place 1 spray into both nostrils daily.   LORazepam 0.5 MG tablet Commonly known as: ATIVAN Take 1 tablet (0.5 mg total) by mouth every 6 (six) hours as needed for anxiety (hiccups).   Melatonin 10 MG Tabs Take 10 mg by mouth daily.   metoprolol tartrate 25 MG tablet Commonly known as: LOPRESSOR Take 1 tablet (25 mg total) by mouth 2 (two) times daily.   mirtazapine 7.5 MG tablet Commonly known as: REMERON Take 7.5 mg by mouth at bedtime.   multivitamin with minerals Tabs tablet  Take 1 tablet by mouth daily.   nitroGLYCERIN 0.4 MG SL tablet Commonly known as: NITROSTAT Place 1 tablet (0.4 mg total) under the tongue every 5 (five) minutes as needed for chest pain.   pantoprazole 40 MG tablet Commonly known as: PROTONIX Take 1 tablet (40 mg total) by mouth daily for 15 days. Replaces: lansoprazole 15 MG capsule   PARoxetine 20 MG tablet Commonly known as: PAXIL Take 10 mg by mouth daily.   PROBIOTIC ACIDOPHILUS PO Take 1 tablet by mouth daily.   rosuvastatin 20 MG tablet Commonly known as: CRESTOR Take 0.5 tablets (10 mg total) by mouth daily.   testosterone cypionate 200 MG/ML  injection Commonly known as: DEPOTESTOSTERONE CYPIONATE Inject 100 mg into the muscle once a week. 1 ml weekly   traMADol-acetaminophen 37.5-325 MG tablet Commonly known as: ULTRACET Take 2 tablets by mouth every 8 (eight) hours as needed for moderate pain.   valsartan 160 MG tablet Commonly known as: DIOVAN Take 1 tablet (160 mg total) by mouth daily.       Allergies  Allergen Reactions  . Garlic Anaphylaxis  . Onion Anaphylaxis  . Amlodipine   . Budesonide-Formoterol Fumarate     Seemed to make breathing worse  . Buprenorphine Nausea Only  . Buspar [Buspirone]   . Celecoxib   . Codeine Other (See Comments)    "PASSES OUT"  . Duloxetine   . Fluticasone   . Fluticasone-Salmeterol   . Glucosamine Forte [Nutritional Supplements]   . Hydrocodone     Pt states that he is allergic to synthetic hydrocodone.  . Ibuprofen     Kidney problems  . Lisinopril     Headaches, nausea, cough  . Mometasone     ineffective  . Novocain [Procaine] Nausea Only  . Sildenafil   . Topiramate Nausea Only  . Trazodone And Nefazodone     Dizzy/off balance    Consultations:  GI   Cardiology    Procedures/Studies: DG Chest 2 View  Result Date: 03/08/2021 CLINICAL DATA:  Chest pain, dyspnea EXAM: CHEST - 2 VIEW COMPARISON:  07/12/2020 FINDINGS: Lungs are well expanded, symmetric, and clear. No pneumothorax or pleural effusion. Cardiac size within normal limits. Pulmonary vascularity is normal. Osseous structures are age-appropriate. No acute bone abnormality. IMPRESSION: No active cardiopulmonary disease. Electronically Signed   By: Fidela Salisbury MD   On: 03/08/2021 18:26   CT HEAD WO CONTRAST  Result Date: 03/11/2021 CLINICAL DATA:  Mental status changes. Unknown cause. Fall. Laceration. EXAM: CT HEAD WITHOUT CONTRAST TECHNIQUE: Contiguous axial images were obtained from the base of the skull through the vertex without intravenous contrast. COMPARISON:  MR head 12/14/2020.  CT head  11/22/2015 FINDINGS: Brain: Mild atrophy and white matter changes are again seen. No acute infarct, hemorrhage, or mass lesion is present. The ventricles are of normal size. No significant extraaxial fluid collection is present. The brainstem and cerebellum are within normal limits. Vascular: Atherosclerotic calcifications are present within the cavernous internal carotid arteries. No hyperdense vessel is present. Skull: No significant soft tissue injury is present. Calvarium is intact. Sinuses/Orbits: The paranasal sinuses and mastoid air cells are clear. The globes and orbits are within normal limits. IMPRESSION: 1. No acute intracranial abnormality or significant interval change. 2. Stable atrophy and white matter disease. This likely reflects the sequela of chronic microvascular ischemia. Electronically Signed   By: San Morelle M.D.   On: 03/11/2021 13:26   CT Angio Chest PE W and/or Wo Contrast  Result Date:  03/08/2021 CLINICAL DATA:  Chest pain, shortness of breath EXAM: CT ANGIOGRAPHY CHEST WITH CONTRAST TECHNIQUE: Multidetector CT imaging of the chest was performed using the standard protocol during bolus administration of intravenous contrast. Multiplanar CT image reconstructions and MIPs were obtained to evaluate the vascular anatomy. CONTRAST:  172mL OMNIPAQUE IOHEXOL 350 MG/ML SOLN COMPARISON:  05/29/2018, 03/08/2021 FINDINGS: Cardiovascular: This is a technically adequate evaluation of the pulmonary vasculature. No filling defects or pulmonary emboli. The heart is unremarkable without pericardial effusion. No evidence of thoracic aortic aneurysm or dissection. Mild atherosclerosis of the aorta and coronary vasculature. Mediastinum/Nodes: No enlarged mediastinal, hilar, or axillary lymph nodes. Thyroid gland, trachea, and esophagus demonstrate no significant findings. Lungs/Pleura: No acute airspace disease, effusion, or pneumothorax. The central airways are patent. Upper Abdomen: Calcified  gallstones are identified without cholecystitis. The remainder of the upper abdomen is unremarkable. Musculoskeletal: There are no acute or destructive bony lesions. Reconstructed images demonstrate no additional findings. Review of the MIP images confirms the above findings. IMPRESSION: 1. No evidence of pulmonary embolus. 2. No acute intrathoracic process. 3. Cholelithiasis without cholecystitis. 4. Aortic Atherosclerosis (ICD10-I70.0). Coronary artery atherosclerosis. Electronically Signed   By: Randa Ngo M.D.   On: 03/08/2021 21:30   US Abdomen Complete  Result Date: 03/11/2021 CLINICAL DATA:  Epigastric pain 1 week. EXAM: ABDOMEN ULTRASOUND COMPLETE COMPARISON:  CT 02/20/2012 FINDINGS: Gallbladder: Mild to moderate cholelithiasis with largest stone measuring 8 mm. No evidence of gallbladder wall thickening or adjacent free fluid. Negative sonographic Murphy sign. Common bile duct: Diameter: 3 mm. Liver: 1 cm cyst over the left lobe. Mild increased parenchymal echogenicity likely representing a degree of hepatic steatosis. Portal vein is patent on color Doppler imaging with normal direction of blood flow towards the liver. IVC: No abnormality visualized. Pancreas: Visualized portion unremarkable. Spleen: Mild splenic enlargement with volume 595 cubic cm and greatest diameter 13 cm. Right Kidney: Length: 12.7 cm. Echogenicity within normal limits. No mass or hydronephrosis visualized. 1 cm cyst. Left Kidney: Length: Not visualized. Moderately atrophic on previous CT. Abdominal aorta: No aneurysm visualized. Other findings: Mild to moderate prostatic enlargement. IMPRESSION: 1. Mild to moderate cholelithiasis. No additional sonographic evidence to suggest acute cholecystitis. 2.  1 cm liver cyst.  Mild hepatic steatosis. 3. 1 cm right renal cyst. Nonvisualization of known atrophic left kidney. 4.  Prostatic enlargement. Electronically Signed   By: Marin Olp M.D.   On: 03/11/2021 10:35    ECHOCARDIOGRAM COMPLETE  Result Date: 03/10/2021    ECHOCARDIOGRAM REPORT   Patient Name:   [name] Kos Date of Exam: 03/10/2021 Medical Rec #:  [id number]        Height:       72.0 in Accession #:    [id number]       Weight:       232.0 lb Date of Birth:  06-Oct-1941       BSA:          2.269 m Patient Age:    80 years         BP:           166/98 mmHg Patient Gender: M                HR:           76 bpm. Exam Location:  Inpatient Procedure: 2D Echo, Cardiac Doppler, Color Doppler and Intracardiac            Opacification Agent Indications:    R07.9* Chest pain,  unspecified  History:        Patient has prior history of Echocardiogram examinations, most                 recent 08/17/2020.  Sonographer:    Merrie Roof Referring Phys: [place] Comments: No parasternal window. IMPRESSIONS  1. Frequent PVCs noted.  2. Left ventricular ejection fraction, by estimation, is 55 to 60%. The left ventricle has normal function. The left ventricle has no regional wall motion abnormalities. Left ventricular diastolic parameters were normal.  3. Right ventricular systolic function is normal. The right ventricular size is normal. Tricuspid regurgitation signal is inadequate for assessing PA pressure.  4. The mitral valve is grossly normal. Mild mitral valve regurgitation. No evidence of mitral stenosis.  5. The aortic valve is grossly normal. Aortic valve regurgitation is not visualized. No aortic stenosis is present.  6. The inferior vena cava is normal in size with greater than 50% respiratory variability, suggesting right atrial pressure of 3 mmHg. Comparison(s): No significant change from prior study. FINDINGS  Left Ventricle: Left ventricular ejection fraction, by estimation, is 55 to 60%. The left ventricle has normal function. The left ventricle has no regional wall motion abnormalities. Definity contrast agent was given IV to delineate the left ventricular  endocardial borders. The left  ventricular internal cavity size was normal in size. There is no left ventricular hypertrophy. Left ventricular diastolic parameters were normal. Right Ventricle: The right ventricular size is normal. No increase in right ventricular wall thickness. Right ventricular systolic function is normal. Tricuspid regurgitation signal is inadequate for assessing PA pressure. Left Atrium: Left atrial size was normal in size. Right Atrium: Right atrial size was normal in size. Pericardium: Trivial pericardial effusion is present. Mitral Valve: The mitral valve is grossly normal. Mild mitral valve regurgitation. No evidence of mitral valve stenosis. Tricuspid Valve: The tricuspid valve is grossly normal. Tricuspid valve regurgitation is not demonstrated. No evidence of tricuspid stenosis. Aortic Valve: The aortic valve is grossly normal. Aortic valve regurgitation is not visualized. No aortic stenosis is present. Aortic valve mean gradient measures 3.0 mmHg. Aortic valve peak gradient measures 5.7 mmHg. Aortic valve area, by VTI measures 3.76 cm. Pulmonic Valve: The pulmonic valve was not well visualized. Aorta: The aortic root is normal in size and structure. Venous: The inferior vena cava is normal in size with greater than 50% respiratory variability, suggesting right atrial pressure of 3 mmHg. IAS/Shunts: The atrial septum is grossly normal.  LEFT VENTRICLE PLAX 2D LVOT diam:     2.40 cm  Diastology LV SV:         95       LV e' medial:    8.05 cm/s LV SV Index:   42       LV E/e' medial:  10.5 LVOT Area:     4.52 cm LV e' lateral:   10.30 cm/s                         LV E/e' lateral: 8.2  RIGHT VENTRICLE RV Basal diam:  4.49 cm RV Mid diam:    3.71 cm RV S prime:     11.10 cm/s TAPSE (M-mode): 1.5 cm LEFT ATRIUM              Index       RIGHT ATRIUM           Index LA Vol (A2C):   92.1  ml  40.59 ml/m RA Area:     19.10 cm LA Vol (A4C):   102.0 ml 44.95 ml/m RA Volume:   60.00 ml  26.44 ml/m LA Biplane Vol: 107.0 ml  47.15 ml/m  AORTIC VALVE AV Area (Vmax):    3.62 cm AV Area (Vmean):   3.47 cm AV Area (VTI):     3.76 cm AV Vmax:           119.00 cm/s AV Vmean:          86.900 cm/s AV VTI:            0.254 m AV Peak Grad:      5.7 mmHg AV Mean Grad:      3.0 mmHg LVOT Vmax:         95.30 cm/s LVOT Vmean:        66.700 cm/s LVOT VTI:          0.211 m LVOT/AV VTI ratio: 0.83 MITRAL VALVE MV Area (PHT): 3.60 cm     SHUNTS MV Decel Time: 211 msec     Systemic VTI:  0.21 m MV E velocity: 84.40 cm/s   Systemic Diam: 2.40 cm MV A velocity: 112.00 cm/s MV E/A ratio:  0.75 Eleonore Chiquito MD Electronically signed by Eleonore Chiquito MD Signature Date/Time: 03/10/2021/5:21:01 PM    Final       Procedures: EGD  Subjective: Patient is feeling better. Hiccups are improving in intensity and frequency, no nausea or vomiting, no dyspnea or chest pain.   Discharge Exam: Vitals:   03/11/21 2009 03/12/21 0457  BP: 140/79 (!) 147/84  Pulse: 73 64  Resp: 20 20  Temp: 98.1 F (36.7 C) 97.7 F (36.5 C)  SpO2: 96% 97%   Vitals:   03/11/21 1804 03/11/21 1825 03/11/21 2009 03/12/21 0457  BP: 109/75  140/79 (!) 147/84  Pulse: 69  73 64  Resp: 15  20 20   Temp: 98.1 F (36.7 C)  98.1 F (36.7 C) 97.7 F (36.5 C)  TempSrc: Oral  Oral Oral  SpO2: 98% 98% 96% 97%  Weight:      Height:        General: Not in pain or dyspnea.  Neurology: Awake and alert, non focal  E ENT: mild pallor, no icterus, oral mucosa moist Cardiovascular: No JVD. S1-S2 present, rhythmic, no gallops, rubs, or murmurs. No lower extremity edema. Pulmonary: positive breath sounds bilaterally, adequate air movement, no wheezing, rhonchi or rales. Gastrointestinal. Abdomen soft and non tender Skin. No rashes Musculoskeletal: no joint deformities   The results of significant diagnostics from this hospitalization (including imaging, microbiology, ancillary and laboratory) are listed below for reference.     Microbiology: Recent Results (from the  past 240 hour(s))  Resp Panel by RT-PCR (Flu A&B, Covid) Nasopharyngeal Swab     Status: None   Collection Time: 03/08/21 10:25 PM   Specimen: Nasopharyngeal Swab; Nasopharyngeal(NP) swabs in vial transport medium  Result Value Ref Range Status   SARS Coronavirus 2 by RT PCR NEGATIVE NEGATIVE Final    Comment: (NOTE) SARS-CoV-2 target nucleic acids are NOT DETECTED.  The SARS-CoV-2 RNA is generally detectable in upper respiratory specimens during the acute phase of infection. The lowest concentration of SARS-CoV-2 viral copies this assay can detect is 138 copies/mL. A negative result does not preclude SARS-Cov-2 infection and should not be used as the sole basis for treatment or other patient management decisions. A negative result may occur with  improper specimen collection/handling, submission  of specimen other than nasopharyngeal swab, presence of viral mutation(s) within the areas targeted by this assay, and inadequate number of viral copies(<138 copies/mL). A negative result must be combined with clinical observations, patient history, and epidemiological information. The expected result is Negative.  Fact Sheet for Patients:  BloggerCourse.comhttps://www.fda.gov/media/152166/download  Fact Sheet for Healthcare Providers:  SeriousBroker.ithttps://www.fda.gov/media/152162/download  This test is no t yet approved or cleared by the [place] FDA and  has been authorized for detection and/or diagnosis of SARS-CoV-2 by FDA under an Emergency Use Authorization (EUA). This EUA will remain  in effect (meaning this test can be used) for the duration of the COVID-19 declaration under Section 564(b)(1) of the Act, 21 U.S.C.section 360bbb-3(b)(1), unless the authorization is terminated  or revoked sooner.       Influenza A by PCR NEGATIVE NEGATIVE Final   Influenza B by PCR NEGATIVE NEGATIVE Final    Comment: (NOTE) The Xpert Xpress SARS-CoV-2/FLU/RSV plus assay is intended as an aid in the diagnosis of  influenza from Nasopharyngeal swab specimens and should not be used as a sole basis for treatment. Nasal washings and aspirates are unacceptable for Xpert Xpress SARS-CoV-2/FLU/RSV testing.  Fact Sheet for Patients: BloggerCourse.comhttps://www.fda.gov/media/152166/download  Fact Sheet for Healthcare Providers: SeriousBroker.ithttps://www.fda.gov/media/152162/download  This test is not yet approved or cleared by the [place] FDA and has been authorized for detection and/or diagnosis of SARS-CoV-2 by FDA under an Emergency Use Authorization (EUA). This EUA will remain in effect (meaning this test can be used) for the duration of the COVID-19 declaration under Section 564(b)(1) of the Act, 21 U.S.C. section 360bbb-3(b)(1), unless the authorization is terminated or revoked.  Performed at [facility], [address] [place], [place] [id number]      Labs: BNP (last 3 results) No results for input(s): BNP in the last 8760 hours. Basic Metabolic Panel: Recent Labs  Lab 03/08/21 1746 03/10/21 1423 03/12/21 0440  NA 143  --  140  K 4.2  --  4.0  CL 107  --  106  CO2 27  --  29  GLUCOSE 136*  --  114*  BUN 19  --  23  CREATININE 1.35*  --  1.47*  CALCIUM 8.9  --  8.3*  MG  --  2.1  --    Liver Function Tests: Recent Labs  Lab 03/08/21 1746  AST 27  ALT 25  ALKPHOS 49  BILITOT 0.8  PROT 7.4  ALBUMIN 4.3   No results for input(s): LIPASE, AMYLASE in the last 168 hours. No results for input(s): AMMONIA in the last 168 hours. CBC: Recent Labs  Lab 03/08/21 1746 03/12/21 0440  WBC 10.8* 8.7  NEUTROABS 9.6* 6.1  HGB 16.9 17.3*  HCT 49.6 52.0  MCV 97.8 99.6  PLT 221 230   Cardiac Enzymes: No results for input(s): CKTOTAL, CKMB, CKMBINDEX, TROPONINI in the last 168 hours. BNP: Invalid input(s): POCBNP CBG: No results for input(s): GLUCAP in the last 168 hours. D-Dimer No results for input(s): DDIMER in the last 72 hours. Hgb A1c No results for input(s): HGBA1C in  the last 72 hours. Lipid Profile No results for input(s): CHOL, HDL, LDLCALC, TRIG, CHOLHDL, LDLDIRECT in the last 72 hours. Thyroid function studies No results for input(s): TSH, T4TOTAL, T3FREE, THYROIDAB in the last 72 hours.  Invalid input(s): FREET3 Anemia work up No results for input(s): VITAMINB12, FOLATE, FERRITIN, TIBC, IRON, RETICCTPCT in the last 72 hours. Urinalysis    Component Value Date/Time   COLORURINE YELLOW  01/13/2014 0915   APPEARANCEUR CLEAR 01/13/2014 0915   LABSPEC 1.007 01/13/2014 0915   PHURINE 5.5 01/13/2014 0915   GLUCOSEU NEGATIVE 01/13/2014 0915   HGBUR NEGATIVE 01/13/2014 0915   BILIRUBINUR NEGATIVE 01/13/2014 0915   KETONESUR NEGATIVE 01/13/2014 0915   PROTEINUR NEGATIVE 01/13/2014 0915   UROBILINOGEN 0.2 01/13/2014 0915   NITRITE NEGATIVE 01/13/2014 0915   LEUKOCYTESUR NEGATIVE 01/13/2014 0915   Sepsis Labs Invalid input(s): PROCALCITONIN,  WBC,  LACTICIDVEN Microbiology Recent Results (from the past 240 hour(s))  Resp Panel by RT-PCR (Flu A&B, Covid) Nasopharyngeal Swab     Status: None   Collection Time: 03/08/21 10:25 PM   Specimen: Nasopharyngeal Swab; Nasopharyngeal(NP) swabs in vial transport medium  Result Value Ref Range Status   SARS Coronavirus 2 by RT PCR NEGATIVE NEGATIVE Final    Comment: (NOTE) SARS-CoV-2 target nucleic acids are NOT DETECTED.  The SARS-CoV-2 RNA is generally detectable in upper respiratory specimens during the acute phase of infection. The lowest concentration of SARS-CoV-2 viral copies this assay can detect is 138 copies/mL. A negative result does not preclude SARS-Cov-2 infection and should not be used as the sole basis for treatment or other patient management decisions. A negative result may occur with  improper specimen collection/handling, submission of specimen other than nasopharyngeal swab, presence of viral mutation(s) within the areas targeted by this assay, and inadequate number of  viral copies(<138 copies/mL). A negative result must be combined with clinical observations, patient history, and epidemiological information. The expected result is Negative.  Fact Sheet for Patients:  EntrepreneurPulse.com.au  Fact Sheet for Healthcare Providers:  IncredibleEmployment.be  This test is no t yet approved or cleared by the [place] FDA and  has been authorized for detection and/or diagnosis of SARS-CoV-2 by FDA under an Emergency Use Authorization (EUA). This EUA will remain  in effect (meaning this test can be used) for the duration of the COVID-19 declaration under Section 564(b)(1) of the Act, 21 U.S.C.section 360bbb-3(b)(1), unless the authorization is terminated  or revoked sooner.       Influenza A by PCR NEGATIVE NEGATIVE Final   Influenza B by PCR NEGATIVE NEGATIVE Final    Comment: (NOTE) The Xpert Xpress SARS-CoV-2/FLU/RSV plus assay is intended as an aid in the diagnosis of influenza from Nasopharyngeal swab specimens and should not be used as a sole basis for treatment. Nasal washings and aspirates are unacceptable for Xpert Xpress SARS-CoV-2/FLU/RSV testing.  Fact Sheet for Patients: EntrepreneurPulse.com.au  Fact Sheet for Healthcare Providers: IncredibleEmployment.be  This test is not yet approved or cleared by the [place] FDA and has been authorized for detection and/or diagnosis of SARS-CoV-2 by FDA under an Emergency Use Authorization (EUA). This EUA will remain in effect (meaning this test can be used) for the duration of the COVID-19 declaration under Section 564(b)(1) of the Act, 21 U.S.C. section 360bbb-3(b)(1), unless the authorization is terminated or revoked.  Performed at [facility], [place] [address] [place], [place] [address]      Time coordinating discharge: 45 minutes  SIGNED:   Tawni Millers, MD  [facility] 03/12/2021, 10:13 AM

## 2021-03-13 ENCOUNTER — Inpatient Hospital Stay (HOSPITAL_COMMUNITY): Payer: Medicare Other | Admitting: Certified Registered Nurse Anesthetist

## 2021-03-13 ENCOUNTER — Encounter (HOSPITAL_COMMUNITY)
Admission: EM | Disposition: A | Discharge status: Home Health | Payer: Self-pay | Source: Home / Self Care | Attending: Internal Medicine

## 2021-03-13 ENCOUNTER — Encounter (HOSPITAL_COMMUNITY): Payer: Self-pay | Admitting: Internal Medicine

## 2021-03-13 DIAGNOSIS — B3781 Candidal esophagitis: Secondary | ICD-10-CM

## 2021-03-13 HISTORY — PX: ESOPHAGOGASTRODUODENOSCOPY (EGD) WITH PROPOFOL: SHX5813

## 2021-03-13 HISTORY — PX: BIOPSY: SHX5522

## 2021-03-13 LAB — CBC
HCT: 50 % (ref 39.0–52.0)
Hemoglobin: 16.9 g/dL (ref 13.0–17.0)
MCH: 33.5 pg (ref 26.0–34.0)
MCHC: 33.8 g/dL (ref 30.0–36.0)
MCV: 99.2 fL (ref 80.0–100.0)
Platelets: 217 10*3/uL (ref 150–400)
RBC: 5.04 MIL/uL (ref 4.22–5.81)
RDW: 13.7 % (ref 11.5–15.5)
WBC: 8.8 10*3/uL (ref 4.0–10.5)
nRBC: 0 % (ref 0.0–0.2)

## 2021-03-13 LAB — BASIC METABOLIC PANEL
Anion gap: 3 — ABNORMAL LOW (ref 5–15)
BUN: 25 mg/dL — ABNORMAL HIGH (ref 8–23)
CO2: 33 mmol/L — ABNORMAL HIGH (ref 22–32)
Calcium: 8.3 mg/dL — ABNORMAL LOW (ref 8.9–10.3)
Chloride: 104 mmol/L (ref 98–111)
Creatinine, Ser: 1.58 mg/dL — ABNORMAL HIGH (ref 0.61–1.24)
GFR, Estimated: 45 mL/min — ABNORMAL LOW (ref >60)
Glucose, Bld: 99 mg/dL (ref 70–99)
Potassium: 5 mmol/L (ref 3.5–5.1)
Sodium: 140 mmol/L (ref 135–145)

## 2021-03-13 SURGERY — ESOPHAGOGASTRODUODENOSCOPY (EGD) WITH PROPOFOL
Anesthesia: Monitor Anesthesia Care

## 2021-03-13 MED ORDER — LIDOCAINE 2% (20 MG/ML) 5 ML SYRINGE
INTRAMUSCULAR | Status: DC | PRN
  Administered 2021-03-13: 80 mg via INTRAVENOUS

## 2021-03-13 MED ORDER — CYCLOBENZAPRINE HCL 5 MG PO TABS: 5.0000 mg | ORAL_TABLET | Freq: Three times a day (TID) | ORAL | 0 refills | Status: AC | PRN

## 2021-03-13 MED ORDER — LACTATED RINGERS IV SOLN
INTRAVENOUS | Status: AC | PRN
  Administered 2021-03-13: 1000 mL via INTRAVENOUS

## 2021-03-13 MED ORDER — FLUCONAZOLE 100 MG PO TABS: 100.0000 mg | ORAL_TABLET | Freq: Every day | ORAL | 0 refills | Status: AC

## 2021-03-13 MED ORDER — PANTOPRAZOLE SODIUM 40 MG PO TBEC: 40.0000 mg | DELAYED_RELEASE_TABLET | Freq: Every day | ORAL | 0 refills | Status: AC

## 2021-03-13 MED ORDER — PROPOFOL 10 MG/ML IV BOLUS
INTRAVENOUS | Status: DC | PRN
  Administered 2021-03-13: 20 mg via INTRAVENOUS

## 2021-03-13 MED ORDER — PROPOFOL 500 MG/50ML IV EMUL
INTRAVENOUS | Status: DC | PRN
  Administered 2021-03-13: 150 ug/kg/min via INTRAVENOUS

## 2021-03-13 MED ORDER — FLUCONAZOLE 100 MG PO TABS
100.0000 mg | ORAL_TABLET | Freq: Every day | ORAL | Status: DC
  Administered 2021-03-13: 100 mg via ORAL
  Filled 2021-03-13: qty 1

## 2021-03-13 SURGICAL SUPPLY — 15 items

## 2021-03-13 NOTE — Discharge Instructions (Signed)
Hiccups  A hiccup is the result of a sudden irritation of a muscle that is used for breathing (diaphragm). The diaphragm is located under your lungs and above your stomach. When the diaphragm gets irritated, it may quickly tighten without your control (have a spasm). The spasm causes you to quickly suck in air, and that causes your vocal cords to close together quickly. These reactions cause the hiccup sound. Hiccups usually last only a short amount of time (less than 48 hours). In unusual cases, they can last for days or months and require you to see your health care provider. Common causes of hiccups include:  Eating too fast or eating too much food.  Drinking alcohol or bubbly (carbonated) drinks.  Eating or drinking hot or spicy foods and drinks.  Swallowing extra air when sucking on candy or a straw or when chewing on gum.  Feeling nervous, stressed, or excited.  Having certain conditions that irritate the diaphragm nerves.  Having metabolic or nervous system disorders. Follow these instructions at home: To prevent hiccups or lessen discomfort from hiccups:  Eat and chew your food slowly.  Eat small meals, and avoid overeating.  If you drink alcohol: ? Limit how much you have to:  0-1 drink a day for women who are not pregnant.  0-2 drinks a day for men. ? Know how much alcohol is in a drink. In the [place] one drink equals one 12 oz bottle of beer (355 mL), one 5 oz glass of wine (148 mL), or one 1 oz glass of hard liquor (44 mL).  Limit your drinking of carbonated or fizzy drinks, such as soda.  Avoid eating or drinking hot or spicy foods and drinks. General instructions  Watch for any changes in your hiccups.  Take over-the-counter and prescription medicines only as told by your health care provider. Contact a health care provider if:  Your hiccups last for more than 48 hours.  Your hiccups do not improve with treatment.  You cannot sleep or eat because of your  hiccups.  You have unexpected weight loss because of your hiccups.  You have numbness, tingling, or weakness. Get help right away if:  You have trouble breathing or swallowing.  You have severe pain in your abdomen. These symptoms may represent a serious problem that is an emergency. Do not wait to see if the symptoms will go away. Get medical help right away. Call your local emergency services (911 in the U.S[place] Do not drive yourself to the hospital. Summary  A hiccup is the result of a sudden irritation of a muscle that is used for breathing (diaphragm).  Hiccups can be caused by many things, including eating too fast.  Call your health care provider if your hiccups last for more than 48 hours. This information is not intended to replace advice given to you by your health care provider. Make sure you discuss any questions you have with your health care provider. Document Revised: 06/24/2020 Document Reviewed: 06/24/2020 Elsevier Patient Education  2021 [place].   Nonspecific Chest Pain, Adult Chest pain can be caused by many different conditions. It can be caused by a condition that is life-threatening and requires treatment right away. It can also be caused by something that is not life-threatening. If you have chest pain, it can be hard to know the difference, so it is important to get help right away to make sure that you do not have a serious condition. Some life-threatening causes of chest pain  include:  Heart attack.  A tear in the body's main blood vessel (aortic dissection).  Inflammation around your heart (pericarditis).  A problem in the lungs, such as a blood clot (pulmonary embolism) or a collapsed lung (pneumothorax). Some non life-threatening causes of chest pain include:  Heartburn.  Anxiety or stress.  Damage to the bones, muscles, and cartilage that make up your chest wall.  Pneumonia or bronchitis.  Shingles infection (varicella-zoster  virus). Chest pain can feel like:  Pain or discomfort on the surface of your chest or deep in your chest.  Crushing, pressure, aching, or squeezing pain.  Burning or tingling.  Dull or sharp pain that is worse when you move, cough, or take a deep breath.  Pain or discomfort that is also felt in your back, neck, jaw, shoulder, or arm, or pain that spreads to any of these areas. Your chest pain may come and go. It may also be constant. Your health care provider will do lab tests and other studies to find the cause of your pain. Treatment will depend on the cause of your chest pain. Follow these instructions at home: Medicines  Take over-the-counter and prescription medicines only as told by your health care provider.  If you were prescribed an antibiotic, take it as told by your health care provider. Do not stop taking the antibiotic even if you start to feel better. Lifestyle  Rest as directed by your health care provider.  Do not use any products that contain nicotine or tobacco, such as cigarettes and e-cigarettes. If you need help quitting, ask your health care provider.  Do not drink alcohol.  Make healthy lifestyle choices as recommended. These may include: ? Getting regular exercise. Ask your health care provider to suggest some activities that are safe for you. ? Eating a heart-healthy diet. This includes plenty of fresh fruits and vegetables, whole grains, low-fat (lean) protein, and low-fat dairy products. A dietitian can help you find healthy eating options. ? Maintaining a healthy weight. ? Managing any other health conditions you have, such as high blood pressure (hypertension) or diabetes. ? Reducing stress, such as with yoga or relaxation techniques.   General instructions  Pay attention to any changes in your symptoms. Tell your health care provider about them or any new symptoms.  Avoid any activities that cause chest pain.  Keep all follow-up visits as told by  your health care provider. This is important. This includes visits for any further testing if your chest pain does not go away. Contact a health care provider if:  Your chest pain does not go away.  You feel depressed.  You have a fever. Get help right away if:  Your chest pain gets worse.  You have a cough that gets worse, or you cough up blood.  You have severe pain in your abdomen.  You faint.  You have sudden, unexplained chest discomfort.  You have sudden, unexplained discomfort in your arms, back, neck, or jaw.  You have shortness of breath at any time.  You suddenly start to sweat, or your skin gets clammy.  You feel nausea or you vomit.  You suddenly feel lightheaded or dizzy.  You have severe weakness, or unexplained weakness or fatigue.  Your heart begins to beat quickly, or it feels like it is skipping beats. These symptoms may represent a serious problem that is an emergency. Do not wait to see if the symptoms will go away. Get medical help right away. Call your local  emergency services (911 in the [place].S[place] Do not drive yourself to the hospital. Summary  Chest pain can be caused by a condition that is serious and requires urgent treatment. It may also be caused by something that is not life-threatening.  If you have chest pain, it is very important to see your health care provider. Your health care provider may do lab tests and other studies to find the cause of your pain.  Follow your health care provider's instructions on taking medicines, making lifestyle changes, and getting emergency treatment if symptoms become worse.  Keep all follow-up visits as told by your health care provider. This includes visits for any further testing if your chest pain does not go away. This information is not intended to replace advice given to you by your health care provider. Make sure you discuss any questions you have with your health care provider. Document Revised:  04/24/2018 Document Reviewed: 04/24/2018 Elsevier Patient Education  2021 [employer].

## 2021-03-13 NOTE — Anesthesia Preprocedure Evaluation (Addendum)
Anesthesia Evaluation  Patient identified by MRN, date of birth, ID band Patient awake    Reviewed: Allergy & Precautions, NPO status , Patient's Chart, lab work & pertinent test results  Airway Mallampati: II  TM Distance: >3 FB Neck ROM: Full    Dental  (+) Teeth Intact   Pulmonary asthma , former smoker,    breath sounds clear to auscultation       Cardiovascular hypertension, + CAD and + Cardiac Stents   Rhythm:Regular Rate:Normal     Neuro/Psych  Headaches, PSYCHIATRIC DISORDERS Anxiety Depression  Neuromuscular disease CVA    GI/Hepatic Neg liver ROS, GERD  ,  Endo/Other    Renal/GU      Musculoskeletal  (+) Arthritis ,   Abdominal Normal abdominal exam  (+)   Peds  Hematology negative hematology ROS (+)   Anesthesia Other Findings   Reproductive/Obstetrics                            Anesthesia Physical Anesthesia Plan  ASA: III  Anesthesia Plan: MAC   Post-op Pain Management:    Induction: Intravenous  PONV Risk Score and Plan: 0 and Propofol infusion  Airway Management Planned: Nasal Cannula and Natural Airway  Additional Equipment: None  Intra-op Plan:   Post-operative Plan:   Informed Consent: I have reviewed the patients History and Physical, chart, labs and discussed the procedure including the risks, benefits and alternatives for the proposed anesthesia with the patient or authorized representative who has indicated his/her understanding and acceptance.       Plan Discussed with: CRNA  Anesthesia Plan Comments:        Anesthesia Quick Evaluation

## 2021-03-13 NOTE — Progress Notes (Signed)
Patient underwent EGD this am, found candida esophagitis, normal stomach and normal duodenum.   His vital signs remained stable and his physical examination has not changed.  Plan to discharge home, continue with fluconazole for 7 days and antiacid therapy. Follow with biopsies as outpatient.

## 2021-03-13 NOTE — Progress Notes (Signed)
Received call stating patient had trigeminal PVCs, patient assessed with no c/o voiced VS WNL, messaged on call provider, no new orders at this time, passed on to oncoming nurse as Juluis Rainier and to let oncoming provider be aware.

## 2021-03-13 NOTE — Plan of Care (Signed)
  Problem: Education: Goal: Knowledge of General Education information will improve Description Including pain rating scale, medication(s)/side effects and non-pharmacologic comfort measures Outcome: Progressing   Problem: Activity: Goal: Risk for activity intolerance will decrease Outcome: Progressing   Problem: Safety: Goal: Ability to remain free from injury will improve Outcome: Progressing   

## 2021-03-13 NOTE — Interval H&P Note (Signed)
History and Physical Interval Note:  03/13/2021 12:43 PM  [name]  has presented today for surgery, with the diagnosis of Hiccups, epigastric pain, atypical chest pain.  The various methods of treatment have been discussed with the patient and family. After consideration of risks, benefits and other options for treatment, the patient has consented to  Procedure(s): ESOPHAGOGASTRODUODENOSCOPY (EGD) WITH PROPOFOL (N/A) as a surgical intervention.  The patient's history has been reviewed, patient examined, no change in status, stable for surgery.  I have reviewed the patient's chart and labs.  Questions were answered to the patient's satisfaction.     Jess Sulak

## 2021-03-13 NOTE — Plan of Care (Signed)

## 2021-03-13 NOTE — Op Note (Signed)
[facility] Patient Name: [name] Procedure Date: 03/13/2021 MRN: [id number] Attending MD: Mauri Pole , MD Date of Birth: [birth year] CSN: [id number] Age: 80 Admit Type: Outpatient Procedure:                Upper GI endoscopy Indications:              Epigastric abdominal pain, Dyspepsia, Atypical                            chest pain, hiccups Providers:                Mauri Pole, MD, Ladona Ridgel,                            Technician, Caryl Pina CRNA, Dulcy Fanny RN Referring MD:              Medicines:                Monitored Anesthesia Care Complications:            No immediate complications. Estimated Blood Loss:     Estimated blood loss was minimal. Procedure:                Pre-Anesthesia Assessment:                           - Prior to the procedure, a History and Physical                            was performed, and patient medications and                            allergies were reviewed. The patient's tolerance of                            previous anesthesia was also reviewed. The risks                            and benefits of the procedure and the sedation                            options and risks were discussed with the patient.                            All questions were answered, and informed consent                            was obtained. Prior Anticoagulants: The patient has                            taken no previous anticoagulant or antiplatelet                            agents. ASA Grade Assessment: III - A patient with  severe systemic disease. After reviewing the risks                            and benefits, the patient was deemed in                            satisfactory condition to undergo the procedure.                           After obtaining informed consent, the endoscope was                            passed under direct vision. Throughout the                             procedure, the patient's blood pressure, pulse, and                            oxygen saturations were monitored continuously. The                            GIF-H190 ([id number]) Olympus gastroscope was                            introduced through the mouth, and advanced to the                            second part of duodenum. The upper GI endoscopy was                            accomplished without difficulty. The patient                            tolerated the procedure well. Scope In: Scope Out: Findings:      LA Grade C (one or more mucosal breaks continuous between tops of 2 or       more mucosal folds, less than 75% circumference) esophagitis with no       bleeding was found 28 to 38 cm from the incisors. Biopsies were taken       with a cold forceps for histology.      The stomach was normal.      The examined duodenum was normal.      The cardia and gastric fundus were normal on retroflexion. Impression:               - LA Grade C candidiasis esophagitis with no                            bleeding. Biopsied.                           - Normal stomach.                           - Normal examined duodenum. Moderate Sedation:      Not Applicable - Patient  had care per Anesthesia. Recommendation:           - Resume previous diet.                           - Continue present medications.                           - Await pathology results.                           - Follow an antireflux regimen.                           - Use Protonix (pantoprazole) 40 mg PO daily.                           - Diflucan (fluconazole) 100 mg PO daily for 7 days.                           - GI will sign off, available if have any questions Procedure Code(s):        --- Professional ---                           [phone], Esophagogastroduodenoscopy, flexible,                            transoral; with biopsy, single or multiple Diagnosis Code(s):        --- Professional ---                            B37.81, Candidal esophagitis                           R10.13, Epigastric pain CPT copyright 2019 American Medical Association. All rights reserved. The codes documented in this report are preliminary and upon coder review may  be revised to meet current compliance requirements. Mauri Pole, MD 03/13/2021 1:14:50 PM This report has been signed electronically. Number of Addenda: 0

## 2021-03-13 NOTE — Transfer of Care (Signed)
Immediate Anesthesia Transfer of Care Note  Patient: Maven Varelas Huisman  Procedure(s) Performed: ESOPHAGOGASTRODUODENOSCOPY (EGD) WITH PROPOFOL (N/A ) BIOPSY  Patient Location: Endoscopy Unit  Anesthesia Type:MAC  Level of Consciousness: awake, alert , oriented and patient cooperative  Airway & Oxygen Therapy: Patient Spontanous Breathing and Patient connected to face mask oxygen  Post-op Assessment: Report given to RN, Post -op Vital signs reviewed and stable and Patient moving all extremities  Post vital signs: Reviewed and stable  Last Vitals:  Vitals Value Taken Time  BP 103/52 03/13/21 1317  Temp 36.5 C 03/13/21 1317  Pulse 60 03/13/21 1319  Resp 15 03/13/21 1319  SpO2 100 % 03/13/21 1319  Vitals shown include unvalidated device data.  Last Pain:  Vitals:   03/13/21 1317  TempSrc: Temporal  PainSc: Asleep      Patients Stated Pain Goal: 2 (46/95/07 2257)  Complications: No complications documented.

## 2021-03-13 NOTE — TOC Transition Note (Addendum)
Transition of Care [facility]) - CM/SW Discharge Note   Patient Details  Name: [name] MRN: [id number] Date of Birth: [birth year]  [facility] of [facility]) CM/SW Contact:  Ross Ludwig, LCSW Phone Number: 03/13/2021, 2:29 PM   Clinical Narrative:     CSW was informed that patient would like home health.  CSW spoke to wife to find out if they have a preference for agency.  Per wife they do not, CSW contacted [facility] and they can accept patient.  Patient will be going home with home health through [facility].  CSW asked patient's wife if they needed any equipment and she said no.  CSW signing off please reconsult with any other social work needs, home health agency has been notified of planned discharge.   Final next level of care: [place] Barriers to Discharge: Barriers Resolved   Patient Goals and CMS Choice Patient states their goals for this hospitalization and ongoing recovery are:: To return back home with home health. CMS Medicare.gov Compare Post Acute Care list provided to:: Patient Represenative (must comment) Choice offered to / list presented to : Spouse  Discharge Placement                       Discharge Plan and Services                          HH Arranged: PT,Nurse's Aide [facility] Agency: [place] ([place]) Date [place]: 03/13/21 Time [place]: 1300 Representative spoke with at [place]: [place] ([place]) Interventions     Readmission Risk Interventions No flowsheet data found.

## 2021-03-14 ENCOUNTER — Encounter (HOSPITAL_COMMUNITY): Payer: Self-pay | Admitting: Gastroenterology

## 2021-03-14 LAB — SURGICAL PATHOLOGY

## 2021-03-14 NOTE — Anesthesia Postprocedure Evaluation (Signed)
Anesthesia Post Note  Patient: Aayansh Codispoti [place]  Procedure(s) Performed: ESOPHAGOGASTRODUODENOSCOPY (EGD) WITH PROPOFOL (N/A ) BIOPSY     Patient location during evaluation: PACU Anesthesia Type: MAC Level of consciousness: awake and alert Pain management: pain level controlled Vital Signs Assessment: post-procedure vital signs reviewed and stable Respiratory status: spontaneous breathing, nonlabored ventilation, respiratory function stable and patient connected to nasal cannula oxygen Cardiovascular status: stable and blood pressure returned to baseline Postop Assessment: no apparent nausea or vomiting Anesthetic complications: no   No complications documented.  Last Vitals:  Vitals:   03/13/21 1330 03/13/21 1357  BP: 116/60 127/73  Pulse: 60 (!) 58  Resp: 15 18  Temp:  (!) 36.4 C  SpO2: 100% 100%    Last Pain:  Vitals:   03/13/21 1357  TempSrc: Oral  PainSc:                  Effie Berkshire

## 2021-03-20 DIAGNOSIS — R066 Hiccough: Secondary | ICD-10-CM | POA: Diagnosis not present

## 2021-03-20 DIAGNOSIS — K209 Esophagitis, unspecified without bleeding: Secondary | ICD-10-CM | POA: Diagnosis not present

## 2021-03-20 DIAGNOSIS — F419 Anxiety disorder, unspecified: Secondary | ICD-10-CM | POA: Diagnosis not present

## 2021-03-20 DIAGNOSIS — R079 Chest pain, unspecified: Secondary | ICD-10-CM | POA: Diagnosis not present

## 2021-03-21 DIAGNOSIS — Z20822 Contact with and (suspected) exposure to covid-19: Secondary | ICD-10-CM | POA: Diagnosis not present

## 2021-03-21 DIAGNOSIS — R0981 Nasal congestion: Secondary | ICD-10-CM | POA: Diagnosis not present

## 2021-03-22 DIAGNOSIS — F411 Generalized anxiety disorder: Secondary | ICD-10-CM | POA: Diagnosis not present

## 2021-03-22 DIAGNOSIS — Z79899 Other long term (current) drug therapy: Secondary | ICD-10-CM | POA: Diagnosis not present

## 2021-03-22 LAB — TSH+T4F+T3FREE
Free T4: 1.29 ng/dL (ref 0.82–1.77)
T3, Free: 3 pg/mL (ref 2.0–4.4)
TSH: 5.69 u[IU]/mL — ABNORMAL HIGH (ref 0.450–4.500)

## 2021-03-27 DIAGNOSIS — M4726 Other spondylosis with radiculopathy, lumbar region: Secondary | ICD-10-CM | POA: Diagnosis not present

## 2021-03-27 DIAGNOSIS — M4802 Spinal stenosis, cervical region: Secondary | ICD-10-CM | POA: Diagnosis not present

## 2021-03-27 DIAGNOSIS — M4722 Other spondylosis with radiculopathy, cervical region: Secondary | ICD-10-CM | POA: Diagnosis not present

## 2021-03-27 DIAGNOSIS — M25511 Pain in right shoulder: Secondary | ICD-10-CM | POA: Diagnosis not present

## 2021-04-06 DIAGNOSIS — R972 Elevated prostate specific antigen [PSA]: Secondary | ICD-10-CM | POA: Diagnosis not present

## 2021-04-26 DIAGNOSIS — F411 Generalized anxiety disorder: Secondary | ICD-10-CM | POA: Diagnosis not present

## 2021-05-09 DIAGNOSIS — M25811 Other specified joint disorders, right shoulder: Secondary | ICD-10-CM | POA: Diagnosis not present

## 2021-05-09 DIAGNOSIS — M25812 Other specified joint disorders, left shoulder: Secondary | ICD-10-CM | POA: Diagnosis not present

## 2021-05-18 DIAGNOSIS — E291 Testicular hypofunction: Secondary | ICD-10-CM | POA: Diagnosis not present

## 2021-06-26 DIAGNOSIS — M4802 Spinal stenosis, cervical region: Secondary | ICD-10-CM | POA: Diagnosis not present

## 2021-06-26 DIAGNOSIS — M25511 Pain in right shoulder: Secondary | ICD-10-CM | POA: Diagnosis not present

## 2021-06-26 DIAGNOSIS — M4722 Other spondylosis with radiculopathy, cervical region: Secondary | ICD-10-CM | POA: Diagnosis not present

## 2021-06-26 DIAGNOSIS — M4726 Other spondylosis with radiculopathy, lumbar region: Secondary | ICD-10-CM | POA: Diagnosis not present

## 2021-07-25 DIAGNOSIS — F411 Generalized anxiety disorder: Secondary | ICD-10-CM | POA: Diagnosis not present

## 2021-07-26 DIAGNOSIS — L821 Other seborrheic keratosis: Secondary | ICD-10-CM | POA: Diagnosis not present

## 2021-07-26 DIAGNOSIS — Z85828 Personal history of other malignant neoplasm of skin: Secondary | ICD-10-CM | POA: Diagnosis not present

## 2021-07-26 DIAGNOSIS — D2271 Melanocytic nevi of right lower limb, including hip: Secondary | ICD-10-CM | POA: Diagnosis not present

## 2021-07-26 DIAGNOSIS — D225 Melanocytic nevi of trunk: Secondary | ICD-10-CM | POA: Diagnosis not present

## 2021-07-26 DIAGNOSIS — L814 Other melanin hyperpigmentation: Secondary | ICD-10-CM | POA: Diagnosis not present

## 2021-08-09 DIAGNOSIS — M25812 Other specified joint disorders, left shoulder: Secondary | ICD-10-CM | POA: Diagnosis not present

## 2021-08-09 DIAGNOSIS — M25811 Other specified joint disorders, right shoulder: Secondary | ICD-10-CM | POA: Diagnosis not present

## 2021-08-14 DIAGNOSIS — N401 Enlarged prostate with lower urinary tract symptoms: Secondary | ICD-10-CM | POA: Diagnosis not present

## 2021-08-24 DIAGNOSIS — R3915 Urgency of urination: Secondary | ICD-10-CM | POA: Diagnosis not present

## 2021-08-24 DIAGNOSIS — N401 Enlarged prostate with lower urinary tract symptoms: Secondary | ICD-10-CM | POA: Diagnosis not present

## 2021-08-24 DIAGNOSIS — R972 Elevated prostate specific antigen [PSA]: Secondary | ICD-10-CM | POA: Diagnosis not present

## 2021-08-24 DIAGNOSIS — E291 Testicular hypofunction: Secondary | ICD-10-CM | POA: Diagnosis not present

## 2021-09-21 DIAGNOSIS — I129 Hypertensive chronic kidney disease with stage 1 through stage 4 chronic kidney disease, or unspecified chronic kidney disease: Secondary | ICD-10-CM | POA: Diagnosis not present

## 2021-09-21 DIAGNOSIS — E785 Hyperlipidemia, unspecified: Secondary | ICD-10-CM | POA: Diagnosis not present

## 2021-09-21 DIAGNOSIS — M109 Gout, unspecified: Secondary | ICD-10-CM | POA: Diagnosis not present

## 2021-09-21 DIAGNOSIS — N183 Chronic kidney disease, stage 3 unspecified: Secondary | ICD-10-CM | POA: Diagnosis not present

## 2021-09-21 DIAGNOSIS — Z86711 Personal history of pulmonary embolism: Secondary | ICD-10-CM | POA: Diagnosis not present

## 2021-09-21 DIAGNOSIS — Z0000 Encounter for general adult medical examination without abnormal findings: Secondary | ICD-10-CM | POA: Diagnosis not present

## 2021-09-23 IMAGING — DX DG CHEST 1V PORT
1 series · 1 of 1 positions shown · non-contrast
Comparison: Radiograph 11/03/2014

CLINICAL DATA: Dyspnea.  Shortness of breath.

EXAM:
PORTABLE CHEST 1 VIEW

[chest ap]
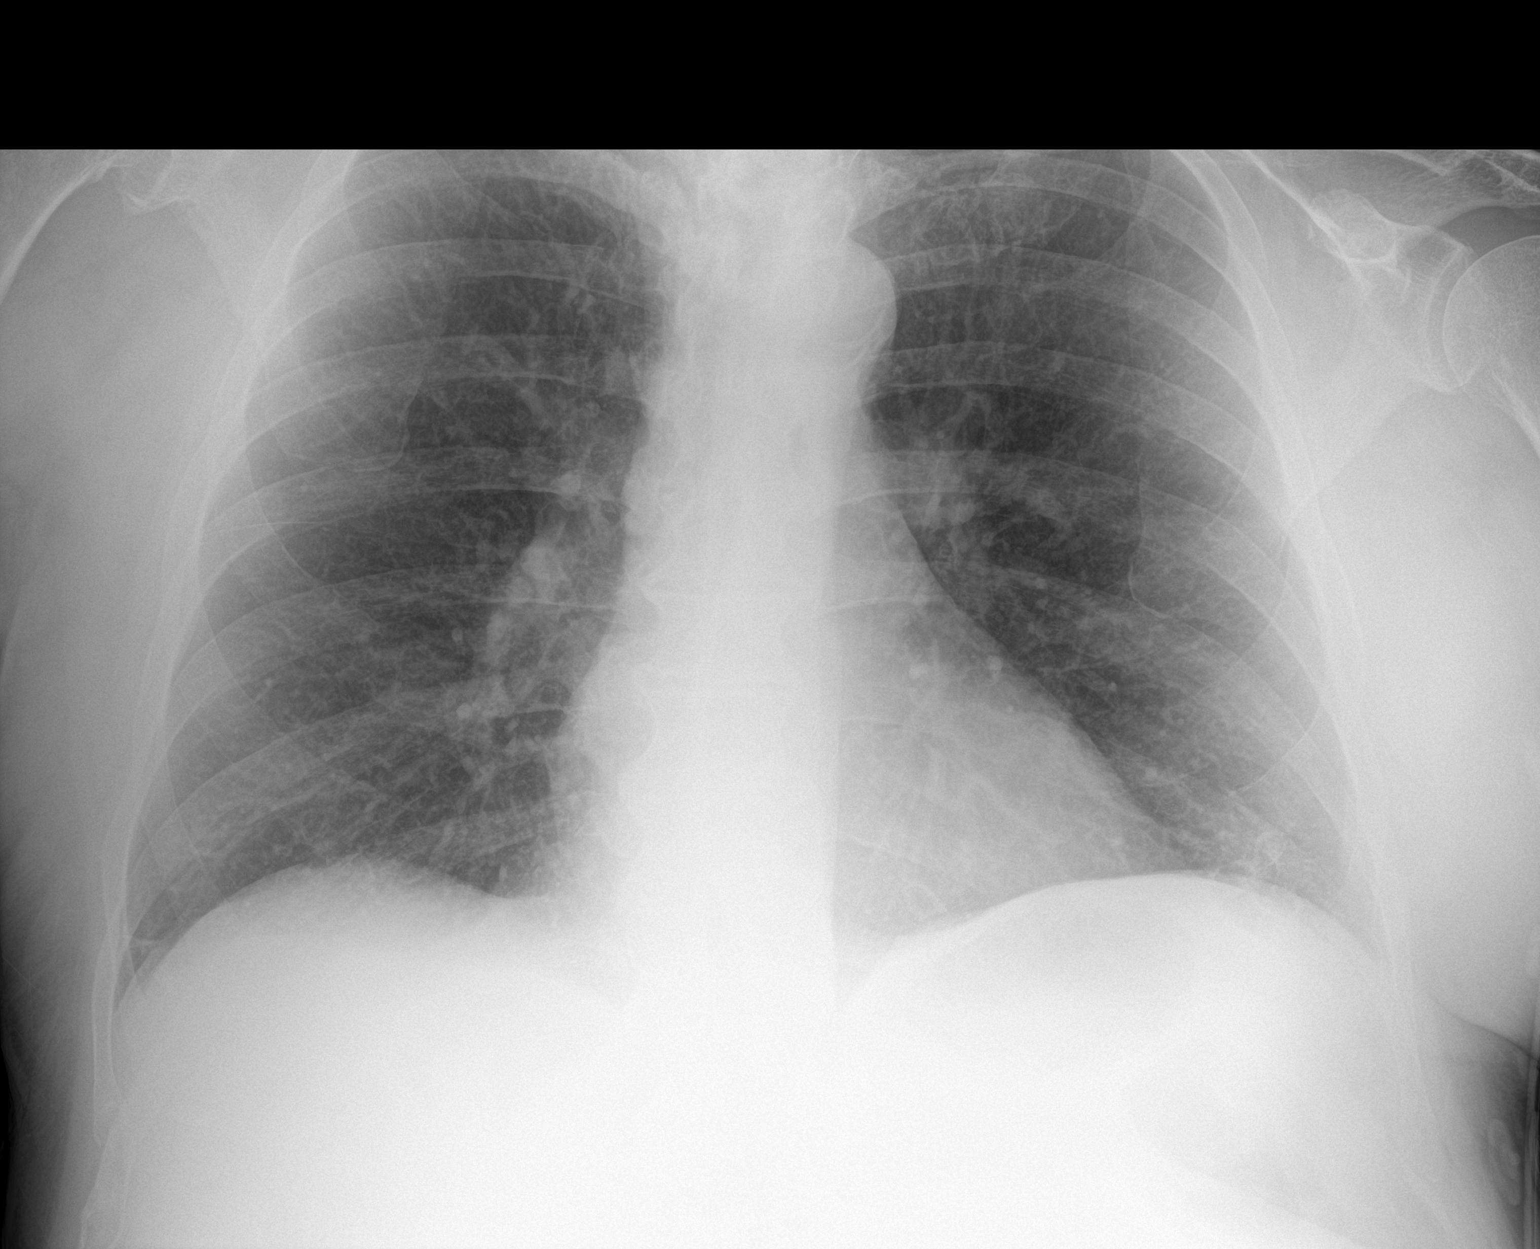

[1 of 1 positions shown; findings below may reference images not displayed]

FINDINGS: The cardiomediastinal contours are normal. Subsegmental atelectasis
in the left lung base. Pulmonary vasculature is normal. No confluent
consolidation, pleural effusion, or pneumothorax. No acute osseous
abnormalities are seen.
IMPRESSION: Subsegmental atelectasis in the left lung base.

## 2021-09-25 DIAGNOSIS — M4802 Spinal stenosis, cervical region: Secondary | ICD-10-CM | POA: Diagnosis not present

## 2021-09-25 DIAGNOSIS — M4726 Other spondylosis with radiculopathy, lumbar region: Secondary | ICD-10-CM | POA: Diagnosis not present

## 2021-09-25 DIAGNOSIS — M25511 Pain in right shoulder: Secondary | ICD-10-CM | POA: Diagnosis not present

## 2021-09-25 DIAGNOSIS — M4722 Other spondylosis with radiculopathy, cervical region: Secondary | ICD-10-CM | POA: Diagnosis not present

## 2021-11-08 DIAGNOSIS — M25811 Other specified joint disorders, right shoulder: Secondary | ICD-10-CM | POA: Diagnosis not present

## 2021-11-08 DIAGNOSIS — M25812 Other specified joint disorders, left shoulder: Secondary | ICD-10-CM | POA: Diagnosis not present

## 2021-11-13 DIAGNOSIS — S39012A Strain of muscle, fascia and tendon of lower back, initial encounter: Secondary | ICD-10-CM | POA: Diagnosis not present

## 2021-11-13 DIAGNOSIS — M545 Low back pain, unspecified: Secondary | ICD-10-CM | POA: Diagnosis not present

## 2021-11-22 ENCOUNTER — Other Ambulatory Visit: Payer: Self-pay | Admitting: Cardiology

## 2021-11-22 DIAGNOSIS — F411 Generalized anxiety disorder: Secondary | ICD-10-CM | POA: Diagnosis not present

## 2021-12-11 ENCOUNTER — Telehealth: Payer: Self-pay | Admitting: Cardiology

## 2021-12-11 ENCOUNTER — Encounter: Payer: Self-pay | Admitting: *Deleted

## 2021-12-11 NOTE — Telephone Encounter (Signed)
Patient is requesting lab orders be scheduled same day as appointment. Orders in system have expired, but the call had been disconnected with patient prior to realizing. Please place orders and schedule.

## 2021-12-11 NOTE — Telephone Encounter (Signed)
Unable to reach pt or leave a message  My chart message sent to the patient regarding lab work.

## 2021-12-19 ENCOUNTER — Other Ambulatory Visit: Payer: Self-pay | Admitting: *Deleted

## 2021-12-19 ENCOUNTER — Telehealth: Payer: Self-pay | Admitting: Cardiology

## 2021-12-19 MED ORDER — AMIODARONE HCL 100 MG PO TABS: 100.0000 mg | ORAL_TABLET | Freq: Every day | ORAL | 1 refills | Status: DC

## 2021-12-19 NOTE — Telephone Encounter (Signed)
° ° °*  STAT* If patient is at the pharmacy, call can be transferred to refill team.   1. Which medications need to be refilled? (please list name of each medication and dose if known)   amiodarone (PACERONE) 100 MG tablet    2. Which pharmacy/location (including street and city if local pharmacy) is medication to be sent to? [place] [id number], [address]  3. Do they need a 30 day or 90 day supply? 90 days  Pt is out of meds and needs refill today

## 2021-12-20 DIAGNOSIS — R0989 Other specified symptoms and signs involving the circulatory and respiratory systems: Secondary | ICD-10-CM | POA: Diagnosis not present

## 2021-12-20 DIAGNOSIS — R29898 Other symptoms and signs involving the musculoskeletal system: Secondary | ICD-10-CM | POA: Diagnosis not present

## 2021-12-20 DIAGNOSIS — R946 Abnormal results of thyroid function studies: Secondary | ICD-10-CM | POA: Diagnosis not present

## 2021-12-20 DIAGNOSIS — R5382 Chronic fatigue, unspecified: Secondary | ICD-10-CM | POA: Diagnosis not present

## 2021-12-21 ENCOUNTER — Other Ambulatory Visit: Payer: Self-pay | Admitting: Family Medicine

## 2021-12-21 DIAGNOSIS — R0989 Other specified symptoms and signs involving the circulatory and respiratory systems: Secondary | ICD-10-CM

## 2021-12-25 ENCOUNTER — Ambulatory Visit
Admission: RE | Admit: 2021-12-25 | Discharge: 2021-12-25 | Disposition: A | Discharge status: Home / Self Care | Payer: Medicare Other | Source: Ambulatory Visit | Attending: Family Medicine | Admitting: Family Medicine

## 2021-12-25 DIAGNOSIS — R0989 Other specified symptoms and signs involving the circulatory and respiratory systems: Secondary | ICD-10-CM | POA: Diagnosis not present

## 2022-01-04 DIAGNOSIS — R3915 Urgency of urination: Secondary | ICD-10-CM | POA: Diagnosis not present

## 2022-01-04 DIAGNOSIS — N401 Enlarged prostate with lower urinary tract symptoms: Secondary | ICD-10-CM | POA: Diagnosis not present

## 2022-01-23 DIAGNOSIS — N1832 Chronic kidney disease, stage 3b: Secondary | ICD-10-CM | POA: Diagnosis not present

## 2022-01-23 DIAGNOSIS — E875 Hyperkalemia: Secondary | ICD-10-CM | POA: Diagnosis not present

## 2022-01-23 DIAGNOSIS — Q600 Renal agenesis, unilateral: Secondary | ICD-10-CM | POA: Diagnosis not present

## 2022-01-23 DIAGNOSIS — I129 Hypertensive chronic kidney disease with stage 1 through stage 4 chronic kidney disease, or unspecified chronic kidney disease: Secondary | ICD-10-CM | POA: Diagnosis not present

## 2022-01-25 DIAGNOSIS — D2271 Melanocytic nevi of right lower limb, including hip: Secondary | ICD-10-CM | POA: Diagnosis not present

## 2022-01-25 DIAGNOSIS — L821 Other seborrheic keratosis: Secondary | ICD-10-CM | POA: Diagnosis not present

## 2022-01-25 DIAGNOSIS — D225 Melanocytic nevi of trunk: Secondary | ICD-10-CM | POA: Diagnosis not present

## 2022-01-25 DIAGNOSIS — Z85828 Personal history of other malignant neoplasm of skin: Secondary | ICD-10-CM | POA: Diagnosis not present

## 2022-01-29 ENCOUNTER — Encounter: Payer: Self-pay | Admitting: Cardiology

## 2022-01-29 ENCOUNTER — Ambulatory Visit: Payer: Medicare Other | Admitting: Cardiology

## 2022-01-29 ENCOUNTER — Other Ambulatory Visit: Payer: Self-pay

## 2022-01-29 VITALS — BP 118/64 | HR 61 | Ht 69.0 in | Wt 241.0 lb

## 2022-01-29 DIAGNOSIS — I2510 Atherosclerotic heart disease of native coronary artery without angina pectoris: Secondary | ICD-10-CM | POA: Diagnosis not present

## 2022-01-29 DIAGNOSIS — Z7901 Long term (current) use of anticoagulants: Secondary | ICD-10-CM | POA: Diagnosis not present

## 2022-01-29 DIAGNOSIS — I10 Essential (primary) hypertension: Secondary | ICD-10-CM

## 2022-01-29 DIAGNOSIS — E785 Hyperlipidemia, unspecified: Secondary | ICD-10-CM

## 2022-01-29 DIAGNOSIS — Z9861 Coronary angioplasty status: Secondary | ICD-10-CM | POA: Diagnosis not present

## 2022-01-29 DIAGNOSIS — Z79899 Other long term (current) drug therapy: Secondary | ICD-10-CM

## 2022-01-29 DIAGNOSIS — I493 Ventricular premature depolarization: Secondary | ICD-10-CM | POA: Diagnosis not present

## 2022-01-29 DIAGNOSIS — I4729 Other ventricular tachycardia: Secondary | ICD-10-CM | POA: Diagnosis not present

## 2022-01-29 DIAGNOSIS — I208 Other forms of angina pectoris: Secondary | ICD-10-CM

## 2022-01-29 DIAGNOSIS — Z86711 Personal history of pulmonary embolism: Secondary | ICD-10-CM

## 2022-01-29 NOTE — Progress Notes (Signed)
? ?Primary Care Provider: Harlan Stains, MD ?Cardiologist: Glenetta Hew, MD ?Electrophysiologist: None ? ?Clinic Note: ?Chief Complaint  ?Patient presents with  ? Follow-up  ?  Annual follow-up.  First in person visit in a long time.  ? Coronary Artery Disease  ?  No angina.  ? ?=================================== ? ?ASSESSMENT/PLAN  ? ?Problem List Items Addressed This Visit   ? ?  ? Cardiology Problems  ? Atypical angina (HCC) -Class II-III (Chronic)  ?  Initial presentation strange symptoms over it somewhat atypical for angina, found to have distal LAD disease.  No further anginal symptoms following PCI. ? ?Symptoms mostly exertional dyspnea and fatigue.  Was evaluated with cardiac CT angiogram that revealed LAD disease. ? ?Plan: Continue beta-blocker and amiodarone ?  ?  ? CAD S/P percutaneous coronary angioplasty - Primary (Chronic)  ?  Essentially single-vessel disease with LAD PCI.  Small diagonal branch also has disease but treated medically.  No angina with rest or exertion.  Relatively active. ?On pretty stable regimen. ? ?Plan: ?Continue low-dose beta-blocker (Lopressor 25 mg twice daily), moderate dose rosuvastatin 20 mg daily and valsartan 160 mg daily.  ?Not on aspirin or Plavix because of apixaban ?No active symptoms.  Doing well.  No changes to meds. ?  ?  ? Relevant Orders  ? EKG 12-Lead (Completed)  ? C-reactive protein  ? Sedimentation rate  ? Pulmonary Function Test  ? Nonsustained ventricular tachycardia (HCC) (Chronic)  ?  Documented frequent PVCs and bursts of NSVT.  Now controlled with reduced dose amiodarone plus metoprolol tartrate -> comanaged with the EP (Dr. Lovena Le). ? ?Follow-up echocardiogram and Myoview relatively normal suggesting that is nonischemic. ? ?Plan: Continue amiodarone 100 mg daily along with metoprolol tartrate 25 mg twice daily. ?-> Standard amiodarone monitoring. ?  ?  ? Essential hypertension (Chronic)  ?  Well-controlled blood pressure on current dose of Lopressor  and losartan.  No change. ?  ?  ? Hyperlipidemia with target LDL less than 70 (Chronic)  ? Frequent unifocal PVCs (Chronic)  ?  As per NSVT: Now on amiodarone 100 mg daily and Lopressor 25 mg twice daily.  No recurrent episodes. ? ?Monitor electrolytes. ?  ?  ? Relevant Orders  ? EKG 12-Lead (Completed)  ? Pulmonary Function Test  ?  ? Other  ? History of pulmonary embolism (Chronic)  ?  On lifelong Eliquis/apixaban.  Monitored by PCP ?Okay to hold for procedures 2-3 days preop ?  ?  ? Chronic anticoagulation (Chronic)  ?  On long-term anticoagulation chronic recurrent VTE-PE. ?Continue apixaban. ? ?Should be okay to hold for 2 to 3 days preop for surgeries or procedures. ? ?  ?  ? Relevant Orders  ? EKG 12-Lead (Completed)  ? Pulmonary Function Test  ? On amiodarone therapy (Chronic)  ?  Long-term management with amiodarone for PVC suppression.  He is taking 100 mg daily.  Symptomatically doing better.  He is also on low-dose beta-blocker. ? ?PCP is not following annual PSA.  Thyroid panel and LFTs.  Would also add that when Dr. Dema Severin sees him for annual follow-up CRP and ESR added to the labs.  Inflammatory markers are surrogate for checking PFTs. ? ?We will also check baseline PFTs since it seems like he will be on the medication for long-term. ? ?Needs annual eye exams ?  ?  ? Relevant Orders  ? C-reactive protein  ? Sedimentation rate  ? Pulmonary Function Test  ? ? ?=================================== ? ?HPI:   ? ?  [name] is a 81 y.o. 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 ?[name] was last seen on December 23, 2020 via telemedicine as a 1 month follow-up after visit on January 11 with Dr. Lovena Le from EP -> at that visit he was started on amiodarone for PVCs.  (This is a 19% PVCs on monitor).  Was doing fairly well.  Back exercising-walking 1.5 miles a  day.  Weather limiting.  Noted some huffing and puffing going up and down hills but no real chest pain or pressure.  Not really symptomatic of the PVCs. ?Amiodarone reduced to 100 mg daily ? ?Recent Hospitalizations:  ?5/4-07/2021: Admitted for atypical chest pain worse associate with intractable hiccups.  Described chest pain/dull pressure.  Thought to be related to hiccups.  Was placed on lorazepam for hiccups.  He developed encephalopathy suffered a fall with head laceration.  He was placed back on beta-blocker because of ventricular bigeminy.  Was treated for esophageal candidiasis treated with Diflucan.  Was continued on 100 mg amiodarone. ? ?Reviewed  CV studies:   ? ?The following studies were reviewed today: (if available, images/films reviewed: From Epic Chart or Care Everywhere) ?2D Echo 03/10/2021: EF 55 to 60%.  No RWMA.  Normal diastolic parameters.  Normal valves.  Normal RV.  Normal PAP.  And RAP. ?ABIs 12/25/2021: Left lower extremity 1.46, right lower extremity 1.3 likely overestimated due to calcification..  Right TBI 0.78 and left TBI 0.53. ? ?Interval History:  ? ?[name] presents here today overall doing fairly well from cardiac standpoint.  He is walking about 2 miles a day he will get little short of breath if he is climbing up an incline, but does fine on flat ground.  He denies any real chest pain or pressure at rest or exertion. ?No heart failure symptoms of PND, orthopnea edema.  No recurrent symptoms of tachycardia or frequent PVCs.  He does note occasional flutters that are relatively short-lived.  Nothing prolonged. ?Overall, his energy level seems better with the reduced dose of amiodarone.  He is doing better with metoprolol then he was on nadolol.  Denies any syncope or near syncope.  The GI issues also improved with reduced dose. ? ?He is on chronic Eliquis because of recurrent VTE-PE.  No bleeding issues. ? ?CV Review of Symptoms (Summary) ?Cardiovascular ROS: no chest pain or  dyspnea on exertion ?positive for - palpitations and these are relatively fleeting.  Mild exertional dyspnea with walking uphill or upstairs.  Also if he overdoes it ?negative for - edema, orthopnea, paroxysmal nocturnal dyspnea, rapid heart rate, shortness of breath, or syncope/near syncope or TIA/amaurosis fugax, claudication ? ?REVIEWED OF SYSTEMS  ? ?Review of Systems  ?Constitutional:  Negative for malaise/fatigue (Does not have full energy, but does okay.) and weight loss (Unfortunately, he gained the weight back that he lost.).  ?HENT:  Negative for nosebleeds.   ?Respiratory:  Negative for cough and shortness of breath.   ?Cardiovascular:   ?     Per HPI  ?Gastrointestinal:  Negative for blood in stool and melena.  ?Genitourinary:  Negative for dysuria and hematuria.  ?Musculoskeletal:  Positive for back pain, joint pain (Mild OA pains.) and myalgias (He tore one of his right bicep tendons, so his chronic aching of the right shoulder.). Negative for falls.  ?Neurological:  Positive for tingling (  Right leg radicular pain-numbness from back pain.) and focal weakness (Every now and then, his right leg will give out on him because of back issues.). Negative for dizziness, speech change and weakness.  ?Psychiatric/Behavioral:  Negative for depression and memory loss. The patient is not nervous/anxious and does not have insomnia.   ? ?Lost some weight.  Dyspnea on exertion with vigorous exertion. ? ?I have reviewed and (if needed) personally updated the patient's problem list, medications, allergies, past medical and surgical history, social and family history.  ? ?PAST MEDICAL HISTORY  ? ?Past Medical History:  ?Diagnosis Date  ? Abnormal heart rhythm   ? frequent PVCs, Dr Ellyn Hack  ? Allergies   ? previously was seen at [facility] office  ? Anxiety   ? Wanda Plump  ? Arthritis   ? KNEES, NECK, SHOULDERS, HANDS  ? Asthma   ? Biceps tendon tear   ? right  ? Biceps tendon tear   ? bilateral arms   ? CAD S/P  percutaneous coronary angioplasty 10/11/2018  ? Cardiac Cath-PCI 06/10/2018: 90% mLAD --> DES PCI (ORSIRO 2.75X18) => 0%.  pRCA 25%. p-m Cx 40%. Ost OM1 50%, ost OM2 40% with 50% mid.  pLAD 30% & ost D1 75 % (med

## 2022-01-29 NOTE — Patient Instructions (Signed)
Medication Instructions:  ?No changes  ? ?*If you need a refill on your cardiac medications before your next appointment, please call your pharmacy* ? ? ?Lab Work: ? Please have this labs done when you see your primary with our regulars that she will be ordering .  Sed rate , C-Reactive protein  ? ?If you have labs (blood work) drawn today and your tests are completely normal, you will receive your results only by: ?MyChart Message (if you have MyChart) OR ?A paper copy in the mail ?If you have any lab test that is abnormal or we need to change your treatment, we will call you to review the results. ? ? ?Testing/Procedures: ?Will be schedule at [facility] -  ?Your physician has recommended that you have a pulmonary function test. Pulmonary Function Tests are a group of tests that measure how well air moves in and out of your lungs. ? ? ? ?Follow-Up: ?At [facility], you and your health needs are our priority.  As part of our continuing mission to provide you with exceptional heart care, we have created designated Provider Care Teams.  These Care Teams include your primary Cardiologist (physician) and Advanced Practice Providers (APPs -  Physician Assistants and Nurse Practitioners) who all work together to provide you with the care you need, when you need it. ? ?  ? ?Your next appointment:   ?12 month(s) ? ?The format for your next appointment:   ?In Person ? ?Provider:   ?Glenetta Hew, MD  ? ? ?Other Instructions  ? ? ?

## 2022-02-03 ENCOUNTER — Encounter: Payer: Self-pay | Admitting: Cardiology

## 2022-02-03 NOTE — Assessment & Plan Note (Signed)
As per NSVT: Now on amiodarone 100 mg daily and Lopressor 25 mg twice daily.  No recurrent episodes. ? ?Monitor electrolytes. ?

## 2022-02-03 NOTE — Assessment & Plan Note (Signed)
Essentially single-vessel disease with LAD PCI.  Small diagonal branch also has disease but treated medically.  No angina with rest or exertion.  Relatively active. ?On pretty stable regimen. ? ?Plan: ?? Continue low-dose beta-blocker (Lopressor 25 mg twice daily), moderate dose rosuvastatin 20 mg daily and valsartan 160 mg daily.  ?? Not on aspirin or Plavix because of apixaban ?? No active symptoms.  Doing well.  No changes to meds. ?

## 2022-02-03 NOTE — Assessment & Plan Note (Signed)
Long-term management with amiodarone for PVC suppression.  He is taking 100 mg daily.  Symptomatically doing better.  He is also on low-dose beta-blocker. ? ?PCP is not following annual PSA.  Thyroid panel and LFTs.  Would also add that when Dr. Dema Severin sees him for annual follow-up CRP and ESR added to the labs.  Inflammatory markers are surrogate for checking PFTs. ? ?We will also check baseline PFTs since it seems like he will be on the medication for long-term. ? ?Needs annual eye exams ?

## 2022-02-03 NOTE — Assessment & Plan Note (Signed)
Well-controlled blood pressure on current dose of Lopressor and losartan.  No change. ?

## 2022-02-03 NOTE — Assessment & Plan Note (Addendum)
Initial presentation strange symptoms over it somewhat atypical for angina, found to have distal LAD disease.  No further anginal symptoms following PCI. ? ?Symptoms mostly exertional dyspnea and fatigue.  Was evaluated with cardiac CT angiogram that revealed LAD disease. ? ?Plan: Continue beta-blocker and amiodarone ?

## 2022-02-03 NOTE — Assessment & Plan Note (Signed)
On long-term anticoagulation chronic recurrent VTE-PE. ?Continue apixaban. ? ?Should be okay to hold for 2 to 3 days preop for surgeries or procedures. ? ?

## 2022-02-03 NOTE — Assessment & Plan Note (Signed)
Documented frequent PVCs and bursts of NSVT.  Now controlled with reduced dose amiodarone plus metoprolol tartrate -> comanaged with the EP (Dr. Lovena Le). ? ?Follow-up echocardiogram and Myoview relatively normal suggesting that is nonischemic. ? ?Plan: Continue amiodarone 100 mg daily along with metoprolol tartrate 25 mg twice daily. ?-> Standard amiodarone monitoring. ?

## 2022-02-03 NOTE — Assessment & Plan Note (Signed)
On lifelong Eliquis/apixaban.  Monitored by PCP ?Okay to hold for procedures 2-3 days preop ?

## 2022-02-06 DIAGNOSIS — J019 Acute sinusitis, unspecified: Secondary | ICD-10-CM | POA: Diagnosis not present

## 2022-02-06 DIAGNOSIS — N183 Chronic kidney disease, stage 3 unspecified: Secondary | ICD-10-CM | POA: Diagnosis not present

## 2022-02-12 DIAGNOSIS — E291 Testicular hypofunction: Secondary | ICD-10-CM | POA: Diagnosis not present

## 2022-02-12 DIAGNOSIS — R948 Abnormal results of function studies of other organs and systems: Secondary | ICD-10-CM | POA: Diagnosis not present

## 2022-02-15 DIAGNOSIS — M25812 Other specified joint disorders, left shoulder: Secondary | ICD-10-CM | POA: Diagnosis not present

## 2022-02-15 DIAGNOSIS — M25811 Other specified joint disorders, right shoulder: Secondary | ICD-10-CM | POA: Diagnosis not present

## 2022-02-15 DIAGNOSIS — M79672 Pain in left foot: Secondary | ICD-10-CM | POA: Diagnosis not present

## 2022-02-19 DIAGNOSIS — E291 Testicular hypofunction: Secondary | ICD-10-CM | POA: Diagnosis not present

## 2022-02-19 DIAGNOSIS — N401 Enlarged prostate with lower urinary tract symptoms: Secondary | ICD-10-CM | POA: Diagnosis not present

## 2022-02-19 DIAGNOSIS — R3915 Urgency of urination: Secondary | ICD-10-CM | POA: Diagnosis not present

## 2022-03-07 ENCOUNTER — Ambulatory Visit (INDEPENDENT_AMBULATORY_CARE_PROVIDER_SITE_OTHER): Payer: Medicare Other | Admitting: Internal Medicine

## 2022-03-07 DIAGNOSIS — I493 Ventricular premature depolarization: Secondary | ICD-10-CM

## 2022-03-07 DIAGNOSIS — Z79899 Other long term (current) drug therapy: Secondary | ICD-10-CM

## 2022-03-07 DIAGNOSIS — I2510 Atherosclerotic heart disease of native coronary artery without angina pectoris: Secondary | ICD-10-CM

## 2022-03-07 DIAGNOSIS — Z7901 Long term (current) use of anticoagulants: Secondary | ICD-10-CM

## 2022-03-07 LAB — PULMONARY FUNCTION TEST
DL/VA % pred: 77 %
DL/VA: 3.05 ml/min/mmHg/L
DLCO cor % pred: 80 %
DLCO cor: 19.8 ml/min/mmHg
DLCO unc % pred: 80 %
DLCO unc: 19.8 ml/min/mmHg
FEF 25-75 Post: 2.71 L/sec
FEF 25-75 Pre: 2.5 L/sec
FEF2575-%Change-Post: 8 %
FEF2575-%Pred-Post: 130 %
FEF2575-%Pred-Pre: 120 %
FEV1-%Change-Post: 4 %
FEV1-%Pred-Post: 100 %
FEV1-%Pred-Pre: 96 %
FEV1-Post: 2.95 L
FEV1-Pre: 2.84 L
FEV1FVC-%Change-Post: 1 %
FEV1FVC-%Pred-Pre: 107 %
FEV6-%Change-Post: 2 %
FEV6-%Pred-Post: 97 %
FEV6-%Pred-Pre: 95 %
FEV6-Post: 3.76 L
FEV6-Pre: 3.66 L
FEV6FVC-%Change-Post: 0 %
FEV6FVC-%Pred-Post: 106 %
FEV6FVC-%Pred-Pre: 106 %
FVC-%Change-Post: 2 %
FVC-%Pred-Post: 91 %
FVC-%Pred-Pre: 89 %
FVC-Post: 3.76 L
FVC-Pre: 3.67 L
Post FEV1/FVC ratio: 79 %
Post FEV6/FVC ratio: 100 %
Pre FEV1/FVC ratio: 77 %
Pre FEV6/FVC Ratio: 100 %
RV % pred: 124 %
RV: 3.26 L
TLC % pred: 97 %
TLC: 6.87 L

## 2022-03-07 NOTE — Progress Notes (Signed)
PFT done today. 

## 2022-04-03 DIAGNOSIS — E291 Testicular hypofunction: Secondary | ICD-10-CM | POA: Diagnosis not present

## 2022-04-23 DIAGNOSIS — N1832 Chronic kidney disease, stage 3b: Secondary | ICD-10-CM | POA: Diagnosis not present

## 2022-05-11 DIAGNOSIS — N1831 Chronic kidney disease, stage 3a: Secondary | ICD-10-CM | POA: Diagnosis not present

## 2022-05-11 DIAGNOSIS — E875 Hyperkalemia: Secondary | ICD-10-CM | POA: Diagnosis not present

## 2022-05-11 DIAGNOSIS — Q600 Renal agenesis, unilateral: Secondary | ICD-10-CM | POA: Diagnosis not present

## 2022-05-11 DIAGNOSIS — I129 Hypertensive chronic kidney disease with stage 1 through stage 4 chronic kidney disease, or unspecified chronic kidney disease: Secondary | ICD-10-CM | POA: Diagnosis not present

## 2022-05-20 IMAGING — CR DG CHEST 2V
2 series · 2 of 2 positions shown · non-contrast
Comparison: 07/12/2020

CLINICAL DATA: Chest pain, dyspnea

EXAM:
CHEST - 2 VIEW

[w chest lat]
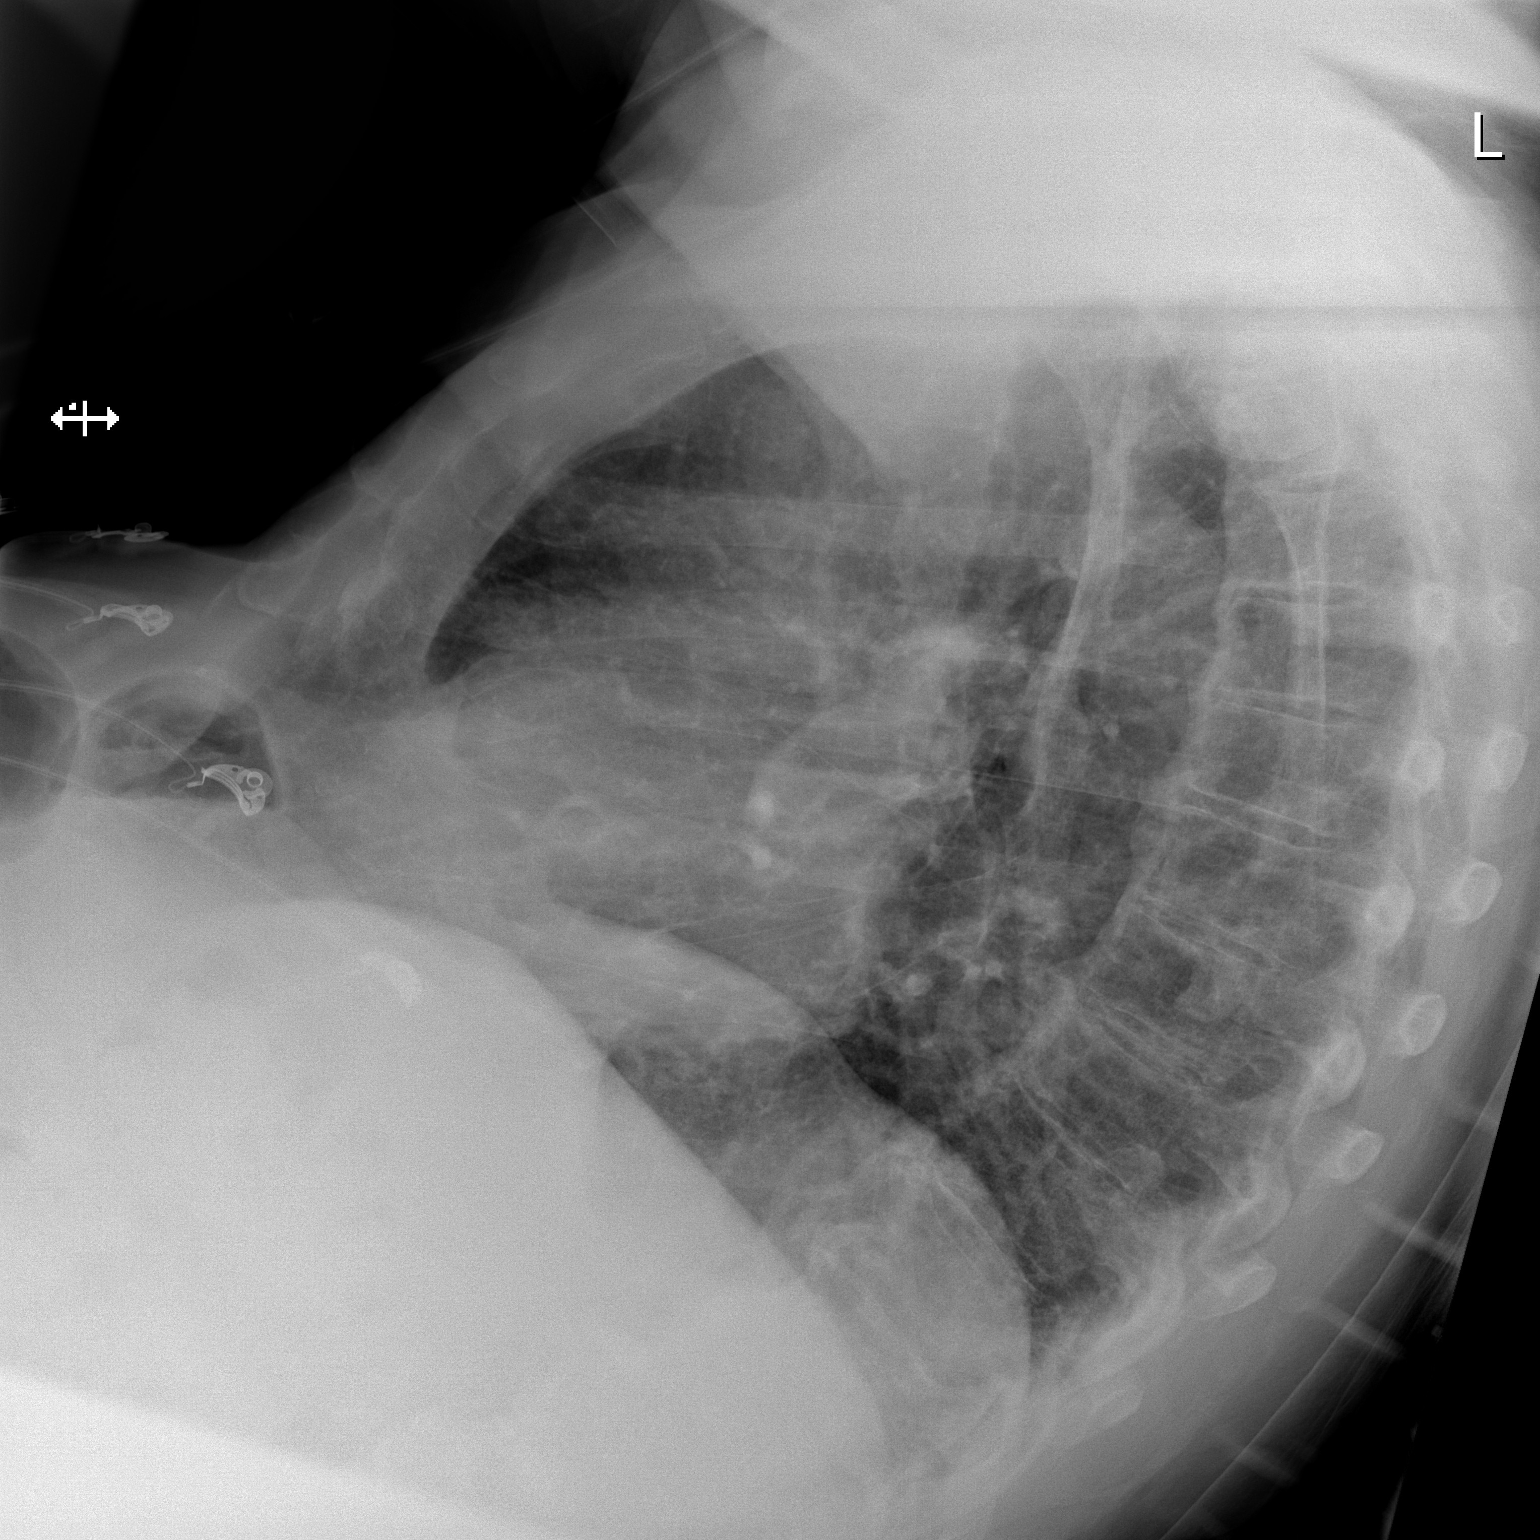

[x chest ap]
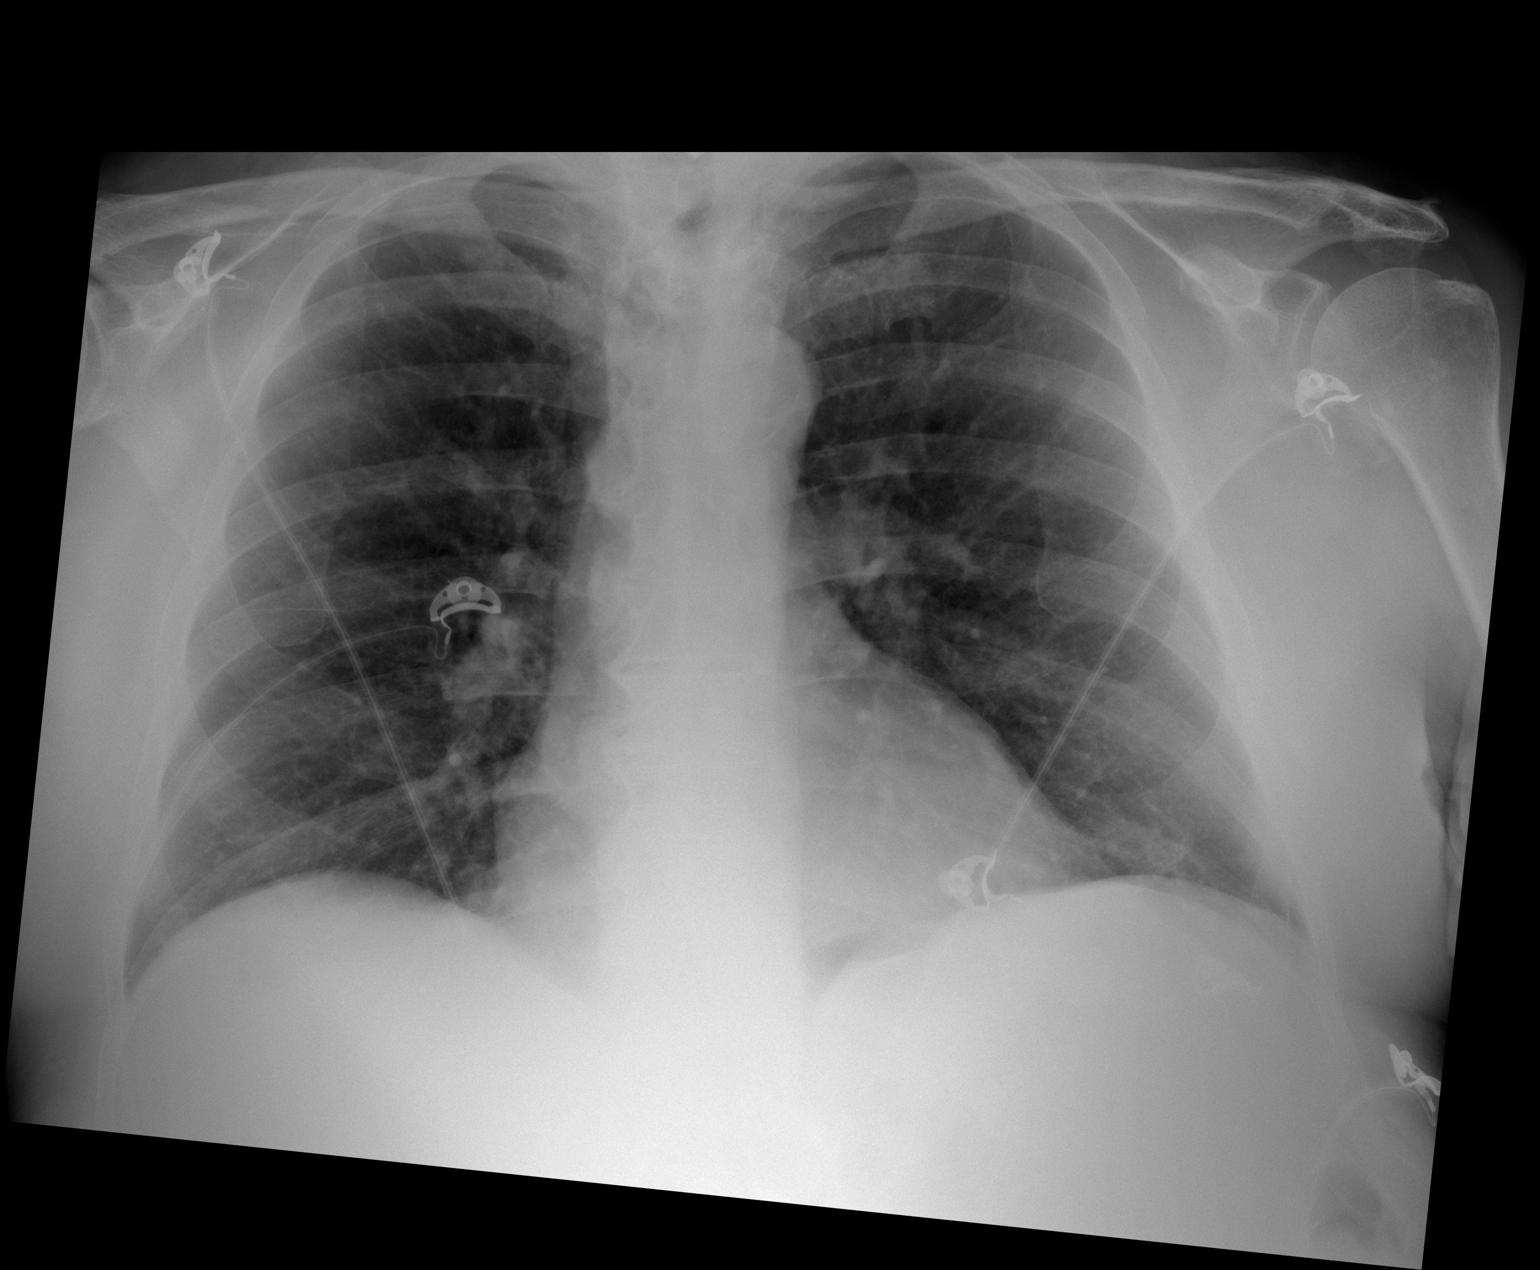

[2 of 2 positions shown; findings below may reference images not displayed]

FINDINGS: Lungs are well expanded, symmetric, and clear. No pneumothorax or
pleural effusion. Cardiac size within normal limits. Pulmonary
vascularity is normal. Osseous structures are age-appropriate. No
acute bone abnormality.
IMPRESSION: No active cardiopulmonary disease.

## 2022-05-21 DIAGNOSIS — F411 Generalized anxiety disorder: Secondary | ICD-10-CM | POA: Diagnosis not present

## 2022-05-21 DIAGNOSIS — N183 Chronic kidney disease, stage 3 unspecified: Secondary | ICD-10-CM | POA: Diagnosis not present

## 2022-05-21 DIAGNOSIS — E785 Hyperlipidemia, unspecified: Secondary | ICD-10-CM | POA: Diagnosis not present

## 2022-05-21 DIAGNOSIS — I129 Hypertensive chronic kidney disease with stage 1 through stage 4 chronic kidney disease, or unspecified chronic kidney disease: Secondary | ICD-10-CM | POA: Diagnosis not present

## 2022-05-21 DIAGNOSIS — I2510 Atherosclerotic heart disease of native coronary artery without angina pectoris: Secondary | ICD-10-CM | POA: Diagnosis not present

## 2022-05-29 DIAGNOSIS — M4722 Other spondylosis with radiculopathy, cervical region: Secondary | ICD-10-CM | POA: Diagnosis not present

## 2022-05-29 DIAGNOSIS — M4726 Other spondylosis with radiculopathy, lumbar region: Secondary | ICD-10-CM | POA: Diagnosis not present

## 2022-05-29 DIAGNOSIS — M4802 Spinal stenosis, cervical region: Secondary | ICD-10-CM | POA: Diagnosis not present

## 2022-05-29 DIAGNOSIS — M25511 Pain in right shoulder: Secondary | ICD-10-CM | POA: Diagnosis not present

## 2022-06-13 ENCOUNTER — Other Ambulatory Visit: Payer: Self-pay | Admitting: Cardiology

## 2022-06-18 DIAGNOSIS — G4733 Obstructive sleep apnea (adult) (pediatric): Secondary | ICD-10-CM | POA: Diagnosis not present

## 2022-06-19 DIAGNOSIS — M25811 Other specified joint disorders, right shoulder: Secondary | ICD-10-CM | POA: Diagnosis not present

## 2022-06-19 DIAGNOSIS — M25812 Other specified joint disorders, left shoulder: Secondary | ICD-10-CM | POA: Diagnosis not present

## 2022-07-10 DIAGNOSIS — G4733 Obstructive sleep apnea (adult) (pediatric): Secondary | ICD-10-CM | POA: Diagnosis not present

## 2022-07-30 ENCOUNTER — Other Ambulatory Visit: Payer: Self-pay | Admitting: Cardiology

## 2022-08-06 DIAGNOSIS — H34231 Retinal artery branch occlusion, right eye: Secondary | ICD-10-CM | POA: Diagnosis not present

## 2022-08-16 DIAGNOSIS — E291 Testicular hypofunction: Secondary | ICD-10-CM | POA: Diagnosis not present

## 2022-08-23 DIAGNOSIS — R972 Elevated prostate specific antigen [PSA]: Secondary | ICD-10-CM | POA: Diagnosis not present

## 2022-08-23 DIAGNOSIS — E291 Testicular hypofunction: Secondary | ICD-10-CM | POA: Diagnosis not present

## 2022-08-23 DIAGNOSIS — R3912 Poor urinary stream: Secondary | ICD-10-CM | POA: Diagnosis not present

## 2022-08-23 DIAGNOSIS — N401 Enlarged prostate with lower urinary tract symptoms: Secondary | ICD-10-CM | POA: Diagnosis not present

## 2022-09-05 DIAGNOSIS — G4733 Obstructive sleep apnea (adult) (pediatric): Secondary | ICD-10-CM | POA: Diagnosis not present

## 2022-09-26 DIAGNOSIS — R946 Abnormal results of thyroid function studies: Secondary | ICD-10-CM | POA: Diagnosis not present

## 2022-09-26 DIAGNOSIS — E785 Hyperlipidemia, unspecified: Secondary | ICD-10-CM | POA: Diagnosis not present

## 2022-09-26 DIAGNOSIS — I129 Hypertensive chronic kidney disease with stage 1 through stage 4 chronic kidney disease, or unspecified chronic kidney disease: Secondary | ICD-10-CM | POA: Diagnosis not present

## 2022-10-01 DIAGNOSIS — E785 Hyperlipidemia, unspecified: Secondary | ICD-10-CM | POA: Diagnosis not present

## 2022-10-01 DIAGNOSIS — N183 Chronic kidney disease, stage 3 unspecified: Secondary | ICD-10-CM | POA: Diagnosis not present

## 2022-10-01 DIAGNOSIS — Z0000 Encounter for general adult medical examination without abnormal findings: Secondary | ICD-10-CM | POA: Diagnosis not present

## 2022-10-01 DIAGNOSIS — I129 Hypertensive chronic kidney disease with stage 1 through stage 4 chronic kidney disease, or unspecified chronic kidney disease: Secondary | ICD-10-CM | POA: Diagnosis not present

## 2022-10-18 DIAGNOSIS — H25013 Cortical age-related cataract, bilateral: Secondary | ICD-10-CM | POA: Diagnosis not present

## 2022-10-18 DIAGNOSIS — H18413 Arcus senilis, bilateral: Secondary | ICD-10-CM | POA: Diagnosis not present

## 2022-10-18 DIAGNOSIS — H25043 Posterior subcapsular polar age-related cataract, bilateral: Secondary | ICD-10-CM | POA: Diagnosis not present

## 2022-10-18 DIAGNOSIS — H2513 Age-related nuclear cataract, bilateral: Secondary | ICD-10-CM | POA: Diagnosis not present

## 2022-10-26 ENCOUNTER — Telehealth: Payer: Self-pay | Admitting: *Deleted

## 2022-10-26 NOTE — Telephone Encounter (Signed)
   Pre-operative Risk Assessment    Patient Name: [name]  DOB: 1941-08-16 MRN: [id number]      Request for Surgical Clearance    Procedure:   cataract extraction w/ intraocular lens implantation of right eye followed by left eye  Date of Surgery:  Clearance 01/16/23 and 01/30/23                             Surgeon:  Dr. Rolanda Jay  Surgeon's Group or Practice Name:  [facility]  Phone number:  [phone] Fax number:  [phone]   Type of Clearance Requested:   - Medical    Type of Anesthesia:   Topical with IV meds   Additional requests/questions:    [name]   10/26/2022, 2:37 PM

## 2022-10-26 NOTE — Telephone Encounter (Signed)
   Patient Name: [name]  DOB: 06-Jun-1941 MRN: [id number]  Primary Cardiologist: Glenetta Hew, MD  Chart reviewed as part of pre-operative protocol coverage. Cataract extractions are recognized in guidelines as low risk surgeries that do not typically require specific preoperative testing or holding of blood thinner therapy. Therefore, given past medical history and time since last visit, based on ACC/AHA guidelines, Lucius Wise Niccoli would be at acceptable risk for the planned procedure without further cardiovascular testing.   I will route this recommendation to the requesting party via Epic fax function and remove from pre-op pool.  Please call with questions.  Deberah Pelton, NP 10/26/2022, 3:08 PM

## 2022-11-15 ENCOUNTER — Telehealth: Payer: Self-pay | Admitting: Cardiology

## 2022-11-15 NOTE — Telephone Encounter (Signed)
Pt c/o of Chest Pain: STAT if CP now or developed within 24 hours  1. Are you having CP right now? no  2. Are you experiencing any other symptoms (ex. SOB, nausea, vomiting, sweating)? SOB  3. How long have you been experiencing CP? A few weeks of SOB and little less than that for the chest pain  4. Is your CP continuous or coming and going? Comes and goes with exertion   5. Have you taken Nitroglycerin? No  Patient states he has been having chest pain and SOB with exertion. He is not currently having symptoms. ?

## 2022-11-15 NOTE — Telephone Encounter (Signed)
LMTCB

## 2022-11-16 DIAGNOSIS — F411 Generalized anxiety disorder: Secondary | ICD-10-CM | POA: Diagnosis not present

## 2022-11-16 MED ORDER — NITROGLYCERIN 0.4 MG SL SUBL: SUBLINGUAL_TABLET | SUBLINGUAL | 3 refills | Status: AC

## 2022-11-16 NOTE — Telephone Encounter (Signed)
Patient was returning your call. Please advise

## 2022-11-16 NOTE — Addendum Note (Signed)
Addended by: Betha Loa F on: 11/16/2022 03:00 PM   Modules accepted: Orders

## 2022-11-16 NOTE — Telephone Encounter (Signed)
Spoke with patient who reported having exertional chest pain and SOB [place] airport terminal. He stated he didn't think he needed to use NTG. No active CP while on phone. BP 143/63, P 63. Patient education on when and how to use NTG and when to call 911. He verbalized understanding. Also gave patient the on-call number for [facility]. Appointment made with [name] for 1/15.

## 2022-11-18 NOTE — Progress Notes (Addendum)
Cardiology Clinic Note   Patient Name: [name] Shed Date of Encounter: 11/19/2022  Primary Care Provider:  Harlan Stains, MD Primary Cardiologist:  Glenetta Hew, MD  Patient Profile    [name] 82 year old male presents the clinic today for evaluation of his chest discomfort.  Past Medical History    Past Medical History:  Diagnosis Date   Abnormal heart rhythm    frequent PVCs, Dr Ellyn Hack   Allergies    previously was seen at [facility] office   [place], NECK, SHOULDERS, HANDS   Asthma    Biceps tendon tear    right   Biceps tendon tear    bilateral arms    CAD S/P percutaneous coronary angioplasty 10/11/2018   Cardiac Cath-PCI 06/10/2018: 90% mLAD --> DES PCI (ORSIRO 2.75X18) => 0%.  pRCA 25%. p-m Cx 40%. Ost OM1 50%, ost OM2 40% with 50% mid.  pLAD 30% & ost D1 75 % (med Rx).  Normal LVEDP.  Diagnostic       Intervention  Orsiro DES 2.75 x 18        Cancer ([place])    skin cancer-  2 basal cell, 1 squamous cell   Cervical spinal stenosis    with foramenal stenosis   Chronic pain    Dr Clarnce Flock to tolerate opioids, antiseizure meds (keppra, topamax, lyrica) SNRI, tramadol not effective, Dr Nelva Bush   Congenital absence of one kidney    has the right kidney   Depression    Wanda Plump   DVT (deep venous thrombosis) (HCC)    hx of w/ pulmonary embolus (4) on chronic anticoagulation   Dysthymia    Wanda Plump   Esophageal reflux    "w/wtricture s/p dilation, Dr Oletta Lamas" per notes from [place] [facility]   Gallstones 04/2014   asymptomatic on CT   GERD (gastroesophageal reflux disease)    Gout, arthritis    BILATERAL GREAT TOE-- PER PT STABLE   Gynecomastia    8/17 mammogram benign   H/O transfusion of packed red blood cells    as a child/ s/p tonsillectomy   Headache    after CVA, improved on Topamax   Hearing loss    hearing aids 2017, [facility]   History of pulmonary embolism     Maintained on lifelong anticoagulation with Eliquis.   Hypertension    Hypogonadism in male    Dr Gilford Rile   Lung nodule    [place]--  CHEST CT 08-22-2011   Moderate obstructive sleep apnea    (PSG 09/26/08 AHI 5/hr REM 25/hr, RDI 28/hr REM 37/hr, 02 nadir 88%; CPAP titration recommended; HSAT 02/03/18 ESS 9, AHI 21/hr, O2 min 83%), Dr Maxwell Caul, has not worn since 10/19, sleeps better without it (per [facility] notes).   Obesity    OSA (obstructive sleep apnea)    NO CPAP -states dr said possibly related to asthma   Paresthesias    right foot   S/P dilatation of esophageal stricture    Stroke [facility])    3 small lacunar infarcts, parietal lobe, 8/15--Dr Tomi Likens, R retinal artery occlusion 8/15, lost almost 50% of vision R eye   Torn rotator cuff    Right x 3   Tubular adenoma 01/2009   polyps, Dr Oletta Lamas   Past Surgical History:  Procedure Laterality Date   BIOPSY  03/13/2021   Procedure: BIOPSY;  Surgeon: Silverio Decamp,  Venia Minks, MD;  Location: [facility];  Service: Endoscopy;;   CARPAL TUNNEL RELEASE Right 2010   COLON SURGERY     x3   CORONARY CALCIUM SCORING WITH CT CORONARY ANGIOGRAM  05/29/2018   Coronary calcium score 416.  Possible obstructive CAD in the mid circumflex-OM1-OM 2, D2 and a moderate disease in the proximal LAD -->  FFR --> LAD: Proximal.90, mid.86 distal.68 D2.80, Cx: Mid.76 OM2.74: Hemodynamically significant distal LAD, D2, mid circumflex and OM2 lesions   CORONARY STENT INTERVENTION N/A 06/10/2018   Procedure: CORONARY STENT INTERVENTION;  Surgeon: Jettie Booze, MD;  Location: [facility];;  90% mLAD --> DES PCI (ORSIRO 2.75X18) => 0%   ESOPHAGOGASTRODUODENOSCOPY (EGD) WITH PROPOFOL N/A 03/13/2021   Procedure: ESOPHAGOGASTRODUODENOSCOPY (EGD) WITH PROPOFOL;  Surgeon: Mauri Pole, MD;  Location: [facility];  Service: Endoscopy;  Laterality: N/A;   JOINT REPLACEMENT     LEFT HEART CATH AND CORONARY ANGIOGRAPHY N/A  06/10/2018   Procedure: LEFT HEART CATH AND CORONARY ANGIOGRAPHY;  Surgeon: Jettie Booze, MD;  Location: [facility];;  90% mLAD --> DES PCI.  pRCA 25%. p-m Cx 40%. Ost OM1 50%, ost OM2 40% with 50% mid.  pLAD 30% & ost D1 75 % (med Rx).  Normal LVEDP.    NEPHRECTOMY     NM MYOVIEW LTD  09/09/2020   EF estimated for 50%.  Normal response to exercise.  Likely definite continuation with severe fixed basal inferior mid inferior defect with normal wall motion. => Recommended echo   SHOULDER ARTHROSCOPY Right    SHOULDER ARTHROSCOPY Left 2021   TONSILLECTOMY  AGE 78   TOTAL KNEE ARTHROPLASTY Left 10/15/2008   TOTAL KNEE ARTHROPLASTY Right 01/18/2014   Procedure: RIGHT TOTAL KNEE ARTHROPLASTY;  Surgeon: Gearlean Alf, MD;  Location: [facility];  Service: Orthopedics;  Laterality: Right;   TRANSTHORACIC ECHOCARDIOGRAM  01/2018    Normal LV size with moderate LVH.  EF 60 to 65% with GR 1 DD (normal for age)   TRANSTHORACIC ECHOCARDIOGRAM  08/17/2020   EF 60 to 65%.  No RWMA.  Normal valves.   WRIST GANGLION EXCISION Right 1964    Allergies  Allergies  Allergen Reactions   Garlic Anaphylaxis   Onion Anaphylaxis   Amlodipine    Budesonide-Formoterol Fumarate     Seemed to make breathing worse   Buprenorphine Nausea Only   Buspar [Buspirone]    Celecoxib    Codeine Other (See Comments)    "PASSES OUT"   Duloxetine    Fluticasone    Fluticasone-Salmeterol    Glucosamine Forte [Nutritional Supplements]    Hydrocodone     Pt states that he is allergic to synthetic hydrocodone.   Ibuprofen     Kidney problems   Lisinopril     Headaches, nausea, cough   Mometasone     ineffective   Novocain [Procaine] Nausea Only   Sildenafil    Topiramate Nausea Only   Trazodone And Nefazodone     Dizzy/off balance    History of Present Illness    [name] is a PMH of essential hypertension, atypical angina, CKD status post percutaneous coronary angioplasty, PVCs, HLD,  dysphagia, esophageal stricture, cervical spinal cord injury, encephalopathy, DOE, chronic anticoagulation, midepigastric pain, CVA, and recurrent DVT-PE  He was admitted to the hospital 5 4-9/22 with atypical chest pain that were associated with hiccups.  He described his chest discomfort as dull pressure.  He was placed on lorazepam for hiccups.  He developed encephalopathy and suffered a fall with head laceration.  He was placed back on beta-blocker therapy due to ventricular bigeminy.  He was treated for esophageal candidiasis with Diflucan and was continued on amiodarone 100 mg.  He underwent cardiac catheterization 8/19 and was noted to have 90% mid LAD (PCI with DES x 1), proximal RCA 25, circumflex 40, ostial OM1 50, ostial OM2 40 with 50% mid, proximal LAD 30%, ostial D1 75% and normal LVEDP  He was seen in follow-up by Dr. Ellyn Hack on 01/29/2022.  He denied further anginal symptoms following PCI.  He did note some exertional dyspnea and fatigue.  He continues to be fairly physically active.  His frequent PVCs and bursts of NSVT need to be controlled with metoprolol and amiodarone (follows with EP).  His follow-up echocardiogram and Myoview were normal and did not suggest ischemia.  He contacted the nurse triage line on 11/16/2022.  He reported that he had an episode of his exertional chest discomfort and shortness of breath on 11/15/2022 while walking in the airport terminal.  He did not take sublingual nitroglycerin.  During the time of his call he was not having active chest pain.  He reported that his blood pressure was 143/63 with a pulse of 63.  He was given ED precautions.  He presents to the clinic today for follow-up evaluation and states he continues to work 3 to 4 days/week at [employer].  He recently returned from a [place] vacation and was in the airport.  He walked from the terminal to baggage claim.  He developed increased shortness of breath and chest discomfort.  The  discomfort lasted for several minutes and then dissipated on its own without intervention.  We reviewed his previous cardiac catheterization.  He did not have anginal symptoms at that time.  He reports that he was surprised that he had gained as much weight as he had.  We reviewed options for evaluation and used shared decision making to decide to order nuclear stress test and echocardiogram.  I will also order CBC and BMP today.  His EKG shows sinus rhythm with frequent PVCs.  I will increase his metoprolol to 37.5 during the day and continue his 25 mg in the evening.  We will have him follow-up after testing.  Today he denies chest pain, shortness of breath, lower extremity edema, fatigue, palpitations, melena, hematuria, hemoptysis, diaphoresis, weakness, presyncope, syncope, orthopnea, and PND.     Home Medications    Prior to Admission medications   Medication Sig Start Date End Date Taking? Authorizing Provider  acetaminophen (TYLENOL) 500 MG tablet Take 1,000 mg by mouth every 6 (six) hours as needed.    [provider]  albuterol (PROVENTIL) (2.5 MG/3ML) 0.083% nebulizer solution Take 3 mLs (2.5 mg total) by nebulization every 6 (six) hours as needed for up to 15 days for wheezing or shortness of breath. 03/12/21 01/29/22  Arrien, Jimmy Picket, MD  alfuzosin (UROXATRAL) 10 MG 24 hr tablet Take 10 mg by mouth daily. 01/04/22   [provider]  allopurinol (ZYLOPRIM) 300 MG tablet Take 300 mg by mouth daily.    [provider]  amiodarone (PACERONE) 200 MG tablet TAKE ONE-HALF (0.5) TABLET BY MOUTH DAILY 06/13/22   Leonie Man, MD  apixaban (ELIQUIS) 5 MG TABS tablet Take 5 mg by mouth 2 (two) times daily.    [provider]  baclofen (LIORESAL) 10 MG tablet Take 10 mg by mouth 2 (two) times daily. 03/07/21  [provider]  clonazePAM (KLONOPIN) 0.5 MG tablet Take 0.5 mg by mouth daily. 11/09/20   [provider]  cyclobenzaprine  (FLEXERIL) 5 MG tablet Take 1 tablet (5 mg total) by mouth 3 (three) times daily as needed for muscle spasms. 03/13/21   Arrien, Jimmy Picket, MD  fluconazole (DIFLUCAN) 100 MG tablet Take 1 tablet (100 mg total) by mouth daily. 03/13/21   Mauri Pole, MD  fluticasone (FLONASE) 50 MCG/ACT nasal spray Place 1 spray into both nostrils daily.    [provider]  Lactobacillus (PROBIOTIC ACIDOPHILUS PO) Take 1 tablet by mouth daily.    [provider]  LORazepam (ATIVAN) 0.5 MG tablet Take 1 tablet (0.5 mg total) by mouth every 6 (six) hours as needed for anxiety (hiccups). 03/12/21   Arrien, Jimmy Picket, MD  Melatonin 10 MG TABS Take 10 mg by mouth daily.    [provider]  metoprolol tartrate (LOPRESSOR) 25 MG tablet Take 1 tablet (25 mg total) by mouth 2 (two) times daily. 03/12/21 04/11/21  Arrien, Jimmy Picket, MD  mirtazapine (REMERON) 7.5 MG tablet Take 7.5 mg by mouth at bedtime. 10/18/20   [provider]  Multiple Vitamin (MULTIVITAMIN WITH MINERALS) TABS tablet Take 1 tablet by mouth daily.    [provider]  nitroGLYCERIN (NITROSTAT) 0.4 MG SL tablet For chest pain, tightness, or pressure. While sitting, place 1 tablet under tongue. May be used every 5 minutes as needed, for up to 15 minutes. Do not use more than 3 tablets. 11/16/22   Leonie Man, MD  pantoprazole (PROTONIX) 40 MG tablet Take 1 tablet (40 mg total) by mouth daily for 15 days. 03/13/21 03/28/21  Arrien, Jimmy Picket, MD  PARoxetine (PAXIL) 20 MG tablet Take 10 mg by mouth daily. 11/09/20   [provider]  rosuvastatin (CRESTOR) 20 MG tablet TAKE 1/2 TABLET BY MOUTH DAILY 07/30/22   Leonie Man, MD  testosterone cypionate (DEPOTESTOSTERONE CYPIONATE) 200 MG/ML injection Inject 100 mg into the muscle once a week. 1 ml weekly 01/14/20   [provider]  traMADol-acetaminophen (ULTRACET) 37.5-325 MG tablet Take 2 tablets by mouth every 8 (eight) hours as  needed for moderate pain.    [provider]  valsartan (DIOVAN) 160 MG tablet Take 1 tablet (160 mg total) by mouth daily. 09/15/20   Leonie Man, MD    Family History    Family History  Problem Relation Age of Onset   Deep vein thrombosis Father    Stroke Father    Stroke Brother    Colon cancer Mother    Migraines Neg Hx    Pancreatic cancer Neg Hx    Stomach cancer Neg Hx    He indicated that his mother is deceased. He indicated that his father is deceased. He indicated that the status of his brother is unknown. He indicated that the status of his neg hx is unknown.  Social History    Social History   Socioeconomic History   Marital status: Married    Spouse name: Not on file   Number of children: 4   Years of education: some master's classes   Highest education level: Bachelor's degree (e.g., BA, AB, BS)  Occupational History   Occupation: CNA  Tobacco Use   Smoking status: Former    Packs/day: 1.00    Years: 25.00    Total pack years: 25.00    Types: Cigarettes    Quit date: 07/13/1980    Years  since quitting: 42.3   Smokeless tobacco: Never  Vaping Use   Vaping Use: Never used  Substance and Sexual Activity   Alcohol use: Yes    Comment: RARE   Drug use: No   Sexual activity: Yes    Partners: Female  Other Topics Concern   Not on file  Social History Narrative   Lives at home with his wife & youngest daughter and her family   Right handed   Drinks 2 cups of coffee daily, occasional cup of tea   Social Determinants of Health   Financial Resource Strain: Not on file  Food Insecurity: Not on file  Transportation Needs: Not on file  Physical Activity: Not on file  Stress: Not on file  Social Connections: Not on file  Intimate Partner Violence: Not on file     Review of Systems    General:  No chills, fever, night sweats or weight changes.  Cardiovascular:  No chest pain, dyspnea on exertion, edema, orthopnea, palpitations, paroxysmal  nocturnal dyspnea. Dermatological: No rash, lesions/masses Respiratory: No cough, dyspnea Urologic: No hematuria, dysuria Abdominal:   No nausea, vomiting, diarrhea, bright red blood per rectum, melena, or hematemesis Neurologic:  No visual changes, wkns, changes in mental status. All other systems reviewed and are otherwise negative except as noted above.  Physical Exam    VS:  BP (!) 138/56 (BP Location: Left Arm, Patient Position: Sitting, Cuff Size: Large)   Pulse 68   Ht '5\' 11"'$  (1.803 m)   Wt 257 lb 14.4 oz (117 kg)   SpO2 98%   BMI 35.97 kg/m  , BMI Body mass index is 35.97 kg/m. GEN: Well nourished, well developed, in no acute distress. HEENT: normal. Neck: Supple, no JVD, carotid bruits, or masses. Cardiac: RRR, no murmurs, rubs, or gallops. No clubbing, cyanosis, edema.  Radials/DP/PT 2+ and equal bilaterally.  Respiratory:  Respirations regular and unlabored, clear to auscultation bilaterally. GI: Soft, nontender, nondistended, BS + x 4. MS: no deformity or atrophy. Skin: warm and dry, no rash. Neuro:  Strength and sensation are intact. Psych: Normal affect.  Accessory Clinical Findings    Recent Labs: No results found for requested labs within last 365 days.   Recent Lipid Panel    Component Value Date/Time   CHOL 98 03/09/2021 0856   CHOL 92 (L) 08/10/2020 0814   TRIG 71 03/09/2021 0856   HDL 35 (L) 03/09/2021 0856   HDL 31 (L) 08/10/2020 0814   CHOLHDL 2.8 03/09/2021 0856   VLDL 14 03/09/2021 0856   LDLCALC 49 03/09/2021 0856   LDLCALC 38 08/10/2020 0814         ECG personally reviewed by me today-sinus rhythm with frequent premature ventricular complexes inferior infarct undetermined age 60 bpm  Echocardiogram 03/10/2021 IMPRESSIONS     1. Frequent PVCs noted.   2. Left ventricular ejection fraction, by estimation, is 55 to 60%. The  left ventricle has normal function. The left ventricle has no regional  wall motion abnormalities. Left  ventricular diastolic parameters were  normal.   3. Right ventricular systolic function is normal. The right ventricular  size is normal. Tricuspid regurgitation signal is inadequate for assessing  PA pressure.   4. The mitral valve is grossly normal. Mild mitral valve regurgitation.  No evidence of mitral stenosis.   5. The aortic valve is grossly normal. Aortic valve regurgitation is not  visualized. No aortic stenosis is present.   6. The inferior vena cava is normal in size  with greater than 50%  respiratory variability, suggesting right atrial pressure of 3 mmHg.   Comparison(s): No significant change from prior study.   FINDINGS   Left Ventricle: Left ventricular ejection fraction, by estimation, is 55  to 60%. The left ventricle has normal function. The left ventricle has no  regional wall motion abnormalities. Definity contrast agent was given IV  to delineate the left ventricular   endocardial borders. The left ventricular internal cavity size was normal  in size. There is no left ventricular hypertrophy. Left ventricular  diastolic parameters were normal.   Right Ventricle: The right ventricular size is normal. No increase in  right ventricular wall thickness. Right ventricular systolic function is  normal. Tricuspid regurgitation signal is inadequate for assessing PA  pressure.   Left Atrium: Left atrial size was normal in size.   Right Atrium: Right atrial size was normal in size.   Pericardium: Trivial pericardial effusion is present.   Mitral Valve: The mitral valve is grossly normal. Mild mitral valve  regurgitation. No evidence of mitral valve stenosis.   Tricuspid Valve: The tricuspid valve is grossly normal. Tricuspid valve  regurgitation is not demonstrated. No evidence of tricuspid stenosis.   Aortic Valve: The aortic valve is grossly normal. Aortic valve  regurgitation is not visualized. No aortic stenosis is present. Aortic  valve mean gradient measures  3.0 mmHg. Aortic valve peak gradient measures  5.7 mmHg. Aortic valve area, by VTI measures  3.76 cm.   Pulmonic Valve: The pulmonic valve was not well visualized.   Aorta: The aortic root is normal in size and structure.   Venous: The inferior vena cava is normal in size with greater than 50%  respiratory variability, suggesting right atrial pressure of 3 mmHg.   IAS/Shunts: The atrial septum is grossly normal.    Nuclear stress test 09/09/2020 The left ventricular ejection fraction is mildly decreased (45-54%). Nuclear stress EF: 49%. Blood pressure demonstrated a normal response to exercise. There was no ST segment deviation noted during stress. Defect 1: There is a medium defect of severe severity present in the basal inferior and mid inferior location. This is a low risk study.   Abnormal, low risk stress nuclear study with prior inferior infarct versus prominent diaphragmatic attenuation.  No ischemia.  Gated ejection fraction 49% with normal wall motion (LV function appears better than calculated ejection fraction; can consider echocardiogram to further assess if indicated).  Assessment & Plan   1.  Chest discomfort, DOE, SOB-no chest discomfort today, denies further episodes of chest discomfort.  Noted chest discomfort with exertion while walking and airport terminal 11/15/2022.  Denies sublingual nitroglycerin use.  Nuclear stress test post PCI showed an EF of 49%, no ischemia and was considered low risk.  Echocardiogram 5/22 showed an EF of 75-10% normal diastolic parameters and no significant valvular abnormalities.  Chest discomfort appears to be atypical in however patient has known coronary disease and did not have any symptoms prior to his first PCI.  Continues to be somewhat physically active and has not had  chest discomfort with walking around at [place]. Continue metoprolol, rosuvastatin, valsartan as needed sublingual nitroglycerin Heart healthy  low-sodium diet-salty 6 given Increase physical activity as tolerated Order CBC, BMP Order echocardiogram Order nuclear stress test  Shared Decision Making/Informed Consent The risks [chest pain, shortness of breath, cardiac arrhythmias, dizziness, blood pressure fluctuations, myocardial infarction, stroke/transient ischemic attack, nausea, vomiting, allergic reaction, radiation exposure, metallic taste sensation and life-threatening complications (estimated to be  1 in 10,000)], benefits (risk stratification, diagnosing coronary artery disease, treatment guidance) and alternatives of a nuclear stress test were discussed in detail with [name] and he agrees to proceed.   Hyperlipidemia-LDL 49 on 03/09/2021 Continue rosuvastatin Heart healthy low-sodium high-fiber diet Increase physical activity as tolerated  Essential hypertension-BP today 138/56. Continue metoprolol, valsartan Heart healthy low-sodium diet-salty 6 given Increase physical activity as tolerated  Palpitations-denies irregular or accelerated heartbeats.  NSVT and PVCs previously noted.  EKG today shows sinus rhythm with frequent PVCs inferior infarct undetermined age 83 bpm Continue amiodarone,  Increase metoprolol to 37.5 and 20 5 in the afternoon Avoid triggers caffeine, chocolate, EtOH, dehydration etc.   Disposition: Follow-up with Dr. Ellyn Hack or me in 6 months.   Jossie Ng. Raylan Hanton NP-C     11/19/2022, 3:10 PM [facility] [address] Office [phone] Fax [phone]    I spent 14 minutes examining this patient, reviewing medications, and using patient centered shared decision making involving her cardiac care.  Prior to her visit I spent greater than 20 minutes reviewing her past medical history,  medications, and prior cardiac tests.

## 2022-11-19 ENCOUNTER — Ambulatory Visit: Payer: Medicare Other | Attending: General Practice | Admitting: General Practice

## 2022-11-19 ENCOUNTER — Encounter: Payer: Self-pay | Admitting: General Practice

## 2022-11-19 VITALS — BP 138/56 | HR 65 | Ht 71.0 in | Wt 257.9 lb

## 2022-11-19 DIAGNOSIS — R0789 Other chest pain: Secondary | ICD-10-CM | POA: Diagnosis not present

## 2022-11-19 DIAGNOSIS — E785 Hyperlipidemia, unspecified: Secondary | ICD-10-CM

## 2022-11-19 DIAGNOSIS — I10 Essential (primary) hypertension: Secondary | ICD-10-CM | POA: Diagnosis not present

## 2022-11-19 DIAGNOSIS — R0602 Shortness of breath: Secondary | ICD-10-CM

## 2022-11-19 DIAGNOSIS — R002 Palpitations: Secondary | ICD-10-CM | POA: Diagnosis not present

## 2022-11-19 DIAGNOSIS — R0609 Other forms of dyspnea: Secondary | ICD-10-CM

## 2022-11-19 MED ORDER — METOPROLOL TARTRATE 25 MG PO TABS: ORAL_TABLET | ORAL | 6 refills | Status: DC

## 2022-11-19 NOTE — Patient Instructions (Signed)
Medication Instructions:  TAKE METOPROLOL 37.5 MG IN THE AM AND 25 MG IN THE PM *If you need a refill on your cardiac medications before your next appointment, please call your pharmacy*  Lab Work: CBC AND BMET If you have labs (blood work) drawn today and your tests are completely normal, you will receive your results only by: [place] (if you have MyChart) OR  A paper copy in the mail If you have any lab test that is abnormal or we need to change your treatment, we will call you to review the results.  Testing/Procedures: Echocardiogram - Your physician has requested that you have an echocardiogram. Echocardiography is a painless test that uses sound waves to create images of your heart. It provides your doctor with information about the size and shape of your heart and how well your heart's chambers and valves are working. This procedure takes approximately one hour. There are no restrictions for this procedure.    LEXISCAN=Your physician has requested that you have a [employer], is a chemical stress test. Please follow instruction sheet, as given.  The Myoview Stress Test is a diagnostic test to evaluate/detect the presence of early heart disease, to assess your functional capacity, or to update the status of your coronary circulation following a cardiac event.   Follow-Up: At [facility], you and your health needs are our priority.  As part of our continuing mission to provide you with exceptional heart care, we have created designated Provider Care Teams.  These Care Teams include your primary Cardiologist (physician) and Advanced Practice Providers (APPs -  Physician Assistants and Nurse Practitioners) who all work together to provide you with the care you need, when you need it.  Your next appointment:   2-3 month(s)  Provider:   Glenetta Hew, MD  or Coletta Memos, FNP        Other Instructions PLEASE READ AND FOLLOW ATTACHED  SALTY 6

## 2022-11-19 NOTE — Addendum Note (Signed)
Addended by: Waylan Rocher on: 11/19/2022 03:46 PM   Modules accepted: Orders

## 2022-11-20 LAB — BASIC METABOLIC PANEL
BUN/Creatinine Ratio: 13 (ref 10–24)
BUN: 17 mg/dL (ref 8–27)
CO2: 25 mmol/L (ref 20–29)
Calcium: 9.1 mg/dL (ref 8.6–10.2)
Chloride: 105 mmol/L (ref 96–106)
Creatinine, Ser: 1.34 mg/dL — ABNORMAL HIGH (ref 0.76–1.27)
Glucose: 89 mg/dL (ref 70–99)
Potassium: 4.3 mmol/L (ref 3.5–5.2)
Sodium: 143 mmol/L (ref 134–144)
eGFR: 54 mL/min/{1.73_m2} — ABNORMAL LOW (ref >59)

## 2022-11-20 LAB — CBC
Hematocrit: 43.6 % (ref 37.5–51.0)
Hemoglobin: 14.9 g/dL (ref 13.0–17.7)
MCH: 32.3 pg (ref 26.6–33.0)
MCHC: 34.2 g/dL (ref 31.5–35.7)
MCV: 95 fL (ref 79–97)
Platelets: 151 10*3/uL (ref 150–450)
RBC: 4.61 x10E6/uL (ref 4.14–5.80)
RDW: 12 % (ref 11.6–15.4)
WBC: 5.5 10*3/uL (ref 3.4–10.8)

## 2022-11-26 NOTE — Addendum Note (Signed)
Addended by: Deberah Pelton on: 11/26/2022 06:11 AM   Modules accepted: Orders

## 2022-11-27 ENCOUNTER — Other Ambulatory Visit: Payer: Self-pay | Admitting: Urology

## 2022-11-27 DIAGNOSIS — R972 Elevated prostate specific antigen [PSA]: Secondary | ICD-10-CM

## 2022-12-13 ENCOUNTER — Telehealth (HOSPITAL_COMMUNITY): Payer: Self-pay

## 2022-12-13 NOTE — Telephone Encounter (Signed)
Spoke with the patient, detailed instructions given. He stated that he would be here for his test. Asked to call back with any questions. S.Beatris Belen EMTP/CCT 

## 2022-12-18 ENCOUNTER — Ambulatory Visit (HOSPITAL_COMMUNITY): Payer: Medicare Other | Attending: General Practice

## 2022-12-18 ENCOUNTER — Ambulatory Visit (HOSPITAL_BASED_OUTPATIENT_CLINIC_OR_DEPARTMENT_OTHER): Payer: Medicare Other

## 2022-12-18 DIAGNOSIS — R0789 Other chest pain: Secondary | ICD-10-CM | POA: Insufficient documentation

## 2022-12-18 DIAGNOSIS — R0602 Shortness of breath: Secondary | ICD-10-CM | POA: Insufficient documentation

## 2022-12-18 LAB — MYOCARDIAL PERFUSION IMAGING
LV dias vol: 114 mL (ref 62–150)
LV sys vol: 60 mL
Nuc Stress EF: 48 %
Peak HR: 70 {beats}/min
Rest HR: 63 {beats}/min
Rest Nuclear Isotope Dose: 11 mCi
SDS: 2
SRS: 0
SSS: 2
ST Depression (mm): 0 mm
Stress Nuclear Isotope Dose: 31.9 mCi
TID: 1.03

## 2022-12-18 LAB — ECHOCARDIOGRAM COMPLETE
Area-P 1/2: 3.37 cm2
MV M vel: 5.54 m/s
MV Peak grad: 122.8 mmHg
S' Lateral: 3.6 cm

## 2022-12-18 MED ORDER — TECHNETIUM TC 99M TETROFOSMIN IV KIT
31.9000 | PACK | Freq: Once | INTRAVENOUS | Status: AC | PRN
  Administered 2022-12-18: 31.9 via INTRAVENOUS

## 2022-12-18 MED ORDER — REGADENOSON 0.4 MG/5ML IV SOLN
0.4000 mg | Freq: Once | INTRAVENOUS | Status: AC
  Administered 2022-12-18: 0.4 mg via INTRAVENOUS

## 2022-12-18 MED ORDER — TECHNETIUM TC 99M TETROFOSMIN IV KIT
11.0000 | PACK | Freq: Once | INTRAVENOUS | Status: AC | PRN
  Administered 2022-12-18: 11 via INTRAVENOUS

## 2023-01-02 DIAGNOSIS — M4726 Other spondylosis with radiculopathy, lumbar region: Secondary | ICD-10-CM | POA: Diagnosis not present

## 2023-01-02 DIAGNOSIS — M4802 Spinal stenosis, cervical region: Secondary | ICD-10-CM | POA: Diagnosis not present

## 2023-01-02 DIAGNOSIS — M4722 Other spondylosis with radiculopathy, cervical region: Secondary | ICD-10-CM | POA: Diagnosis not present

## 2023-01-02 DIAGNOSIS — M25511 Pain in right shoulder: Secondary | ICD-10-CM | POA: Diagnosis not present

## 2023-01-02 DIAGNOSIS — G894 Chronic pain syndrome: Secondary | ICD-10-CM | POA: Diagnosis not present

## 2023-01-02 DIAGNOSIS — Z79891 Long term (current) use of opiate analgesic: Secondary | ICD-10-CM | POA: Diagnosis not present

## 2023-01-04 DIAGNOSIS — N1831 Chronic kidney disease, stage 3a: Secondary | ICD-10-CM | POA: Diagnosis not present

## 2023-01-08 DIAGNOSIS — I129 Hypertensive chronic kidney disease with stage 1 through stage 4 chronic kidney disease, or unspecified chronic kidney disease: Secondary | ICD-10-CM | POA: Diagnosis not present

## 2023-01-08 DIAGNOSIS — E875 Hyperkalemia: Secondary | ICD-10-CM | POA: Diagnosis not present

## 2023-01-08 DIAGNOSIS — N1832 Chronic kidney disease, stage 3b: Secondary | ICD-10-CM | POA: Diagnosis not present

## 2023-01-08 DIAGNOSIS — Q600 Renal agenesis, unilateral: Secondary | ICD-10-CM | POA: Diagnosis not present

## 2023-01-15 DIAGNOSIS — M25812 Other specified joint disorders, left shoulder: Secondary | ICD-10-CM | POA: Diagnosis not present

## 2023-01-15 DIAGNOSIS — M25811 Other specified joint disorders, right shoulder: Secondary | ICD-10-CM | POA: Diagnosis not present

## 2023-01-16 DIAGNOSIS — H2511 Age-related nuclear cataract, right eye: Secondary | ICD-10-CM | POA: Diagnosis not present

## 2023-01-17 DIAGNOSIS — H2512 Age-related nuclear cataract, left eye: Secondary | ICD-10-CM | POA: Diagnosis not present

## 2023-01-30 DIAGNOSIS — H2512 Age-related nuclear cataract, left eye: Secondary | ICD-10-CM | POA: Diagnosis not present

## 2023-02-06 DIAGNOSIS — L578 Other skin changes due to chronic exposure to nonionizing radiation: Secondary | ICD-10-CM | POA: Diagnosis not present

## 2023-02-06 DIAGNOSIS — L821 Other seborrheic keratosis: Secondary | ICD-10-CM | POA: Diagnosis not present

## 2023-02-06 DIAGNOSIS — D225 Melanocytic nevi of trunk: Secondary | ICD-10-CM | POA: Diagnosis not present

## 2023-02-06 DIAGNOSIS — L570 Actinic keratosis: Secondary | ICD-10-CM | POA: Diagnosis not present

## 2023-02-13 NOTE — Progress Notes (Unsigned)
Cardiology Clinic Note   Patient Name: [name] Date of Encounter: 02/14/2023  Primary Care Provider:  Laurann Montana, MD Primary Cardiologist:  Bryan Lemma, MD  Patient Profile    [name] 82 year old male presents the clinic today for follow-up evaluation of his chest discomfort and hypertension.  Past Medical History    Past Medical History:  Diagnosis Date   Abnormal heart rhythm    frequent PVCs, Dr Herbie Baltimore   Allergies    previously was seen at [facility] office   Anxiety    Burnadette Pop   Arthritis    KNEES, NECK, SHOULDERS, HANDS   Asthma    Biceps tendon tear    right   Biceps tendon tear    bilateral arms    CAD S/P percutaneous coronary angioplasty 10/11/2018   Cardiac Cath-PCI 06/10/2018: 90% mLAD --> DES PCI (ORSIRO 2.75X18) => 0%.  pRCA 25%. p-m Cx 40%. Ost OM1 50%, ost OM2 40% with 50% mid.  pLAD 30% & ost D1 75 % (med Rx).  Normal LVEDP.  Diagnostic       Intervention  Orsiro DES 2.75 x 18        Cancer    skin cancer-  2 basal cell, 1 squamous cell   Cervical spinal stenosis    with foramenal stenosis   Chronic pain    Dr Thersa Salt to tolerate opioids, antiseizure meds (keppra, topamax, lyrica) SNRI, tramadol not effective, Dr Ethelene Hal   Congenital absence of one kidney    has the right kidney   Depression    Burnadette Pop   DVT (deep venous thrombosis)    hx of w/ pulmonary embolus (4) on chronic anticoagulation   Dysthymia    Burnadette Pop   Esophageal reflux    "w/wtricture s/p dilation, Dr Randa Evens" per notes from [facility]   Gallstones 04/2014   asymptomatic on CT   GERD (gastroesophageal reflux disease)    Gout, arthritis    BILATERAL GREAT TOE-- PER PT STABLE   Gynecomastia    8/17 mammogram benign   H/O transfusion of packed red blood cells    as a child/ s/p tonsillectomy   Headache    after CVA, improved on Topamax   Hearing loss    hearing aids 2017, [facility]   History of pulmonary  embolism    Maintained on lifelong anticoagulation with Eliquis.   Hypertension    Hypogonadism in male    Dr Sande Brothers   Lung nodule    BENIGN RIGHT UPPER--  CHEST CT 08-22-2011   Moderate obstructive sleep apnea    (PSG 09/26/08 AHI 5/hr REM 25/hr, RDI 28/hr REM 37/hr, 02 nadir 88%; CPAP titration recommended; HSAT 02/03/18 ESS 9, AHI 21/hr, O2 min 83%), Dr Earl Gala, has not worn since 10/19, sleeps better without it (per [facility] notes).   Obesity    OSA (obstructive sleep apnea)    NO CPAP -states dr said possibly related to asthma   Paresthesias    right foot   S/P dilatation of esophageal stricture    Stroke    3 small lacunar infarcts, parietal lobe, 8/15--Dr Everlena Cooper, R retinal artery occlusion 8/15, lost almost 50% of vision R eye   Torn rotator cuff    Right x 3   Tubular adenoma 01/2009   polyps, Dr Randa Evens   Past Surgical History:  Procedure Laterality Date   BIOPSY  03/13/2021   Procedure: BIOPSY;  Surgeon: Lavon Paganini,  Eleonore Chiquito, MD;  Location: [facility];  Service: Endoscopy;;   CARPAL TUNNEL RELEASE Right 2010   COLON SURGERY     x3   CORONARY CALCIUM SCORING WITH CT CORONARY ANGIOGRAM  05/29/2018   Coronary calcium score 416.  Possible obstructive CAD in the mid circumflex-OM1-OM 2, D2 and a moderate disease in the proximal LAD -->  FFR --> LAD: Proximal.90, mid.86 distal.68 D2.80, Cx: Mid.76 OM2.74: Hemodynamically significant distal LAD, D2, mid circumflex and OM2 lesions   CORONARY STENT INTERVENTION N/A 06/10/2018   Procedure: CORONARY STENT INTERVENTION;  Surgeon: Corky Crafts, MD;  Location: [facility];;  90% mLAD --> DES PCI (ORSIRO 2.75X18) => 0%   ESOPHAGOGASTRODUODENOSCOPY (EGD) WITH PROPOFOL N/A 03/13/2021   Procedure: ESOPHAGOGASTRODUODENOSCOPY (EGD) WITH PROPOFOL;  Surgeon: Napoleon Form, MD;  Location: [facility];  Service: Endoscopy;  Laterality: N/A;   JOINT REPLACEMENT     LEFT HEART CATH AND CORONARY ANGIOGRAPHY  N/A 06/10/2018   Procedure: LEFT HEART CATH AND CORONARY ANGIOGRAPHY;  Surgeon: Corky Crafts, MD;  Location: [facility];;  90% mLAD --> DES PCI.  pRCA 25%. p-m Cx 40%. Ost OM1 50%, ost OM2 40% with 50% mid.  pLAD 30% & ost D1 75 % (med Rx).  Normal LVEDP.    NEPHRECTOMY     NM MYOVIEW LTD  09/09/2020   EF estimated for 50%.  Normal response to exercise.  Likely definite continuation with severe fixed basal inferior mid inferior defect with normal wall motion. => Recommended echo   SHOULDER ARTHROSCOPY Right    SHOULDER ARTHROSCOPY Left 2021   TONSILLECTOMY  AGE 42   TOTAL KNEE ARTHROPLASTY Left 10/15/2008   TOTAL KNEE ARTHROPLASTY Right 01/18/2014   Procedure: RIGHT TOTAL KNEE ARTHROPLASTY;  Surgeon: Loanne Drilling, MD;  Location: [facility];  Service: Orthopedics;  Laterality: Right;   TRANSTHORACIC ECHOCARDIOGRAM  01/2018    Normal LV size with moderate LVH.  EF 60 to 65% with GR 1 DD (normal for age)   TRANSTHORACIC ECHOCARDIOGRAM  08/17/2020   EF 60 to 65%.  No RWMA.  Normal valves.   WRIST GANGLION EXCISION Right 1964    Allergies  Allergies  Allergen Reactions   Garlic Anaphylaxis   Onion Anaphylaxis   Amlodipine    Budesonide-Formoterol Fumarate     Seemed to make breathing worse   Buprenorphine Nausea Only   Buspar [Buspirone]    Celecoxib    Codeine Other (See Comments)    "PASSES OUT"   Duloxetine    Fluticasone    Fluticasone-Salmeterol    Glucosamine Forte [Nutritional Supplements]    Hydrocodone     Pt states that he is allergic to synthetic hydrocodone.   Ibuprofen     Kidney problems   Lisinopril     Headaches, nausea, cough   Mometasone     ineffective   Novocain [Procaine] Nausea Only   Sildenafil    Topiramate Nausea Only   Trazodone And Nefazodone     Dizzy/off balance    History of Present Illness    [name] is a PMH of essential hypertension, atypical angina, CKD status post percutaneous coronary angioplasty, PVCs, HLD,  dysphagia, esophageal stricture, cervical spinal cord injury, encephalopathy, DOE, chronic anticoagulation, midepigastric pain, CVA, and recurrent DVT-PE  He was admitted to the hospital 5 4-9/22 with atypical chest pain that were associated with hiccups.  He described his chest discomfort as dull pressure.  He was placed on lorazepam for hiccups.  He developed encephalopathy and suffered a fall with head laceration.  He was placed back on beta-blocker therapy due to ventricular bigeminy.  He was treated for esophageal candidiasis with Diflucan and was continued on amiodarone 100 mg.  He underwent cardiac catheterization 8/19 and was noted to have 90% mid LAD (PCI with DES x 1), proximal RCA 25, circumflex 40, ostial OM1 50, ostial OM2 40 with 50% mid, proximal LAD 30%, ostial D1 75% and normal LVEDP  He was seen in follow-up by Dr. Herbie BaltimoreHarding on 01/29/2022.  He denied further anginal symptoms following PCI.  He did note some exertional dyspnea and fatigue.  He continues to be fairly physically active.  His frequent PVCs and bursts of NSVT need to be controlled with metoprolol and amiodarone (follows with EP).  His follow-up echocardiogram and Myoview were normal and did not suggest ischemia.  He contacted the nurse triage line on 11/16/2022.  He reported that he had an episode of his exertional chest discomfort and shortness of breath on 11/15/2022 while walking in the airport terminal.  He did not take sublingual nitroglycerin.  During the time of his call he was not having active chest pain.  He reported that his blood pressure was 143/63 with a pulse of 63.  He was given ED precautions.  He presented to the clinic 11/19/2022 for follow-up evaluation and stated he continued to work 3 to 4 days/week at [employer].  He [place] from [place] and was in the airport.  He walked from the terminal to baggage claim.  He developed increased shortness of breath and chest discomfort.  The  discomfort lasted for several minutes and then dissipated on its own without intervention.  We reviewed his previous cardiac catheterization.  He did not have anginal symptoms at that time.  He reported that he was surprised that he had gained as much weight as he had.  We reviewed options for evaluation and used shared decision making to decide to order nuclear stress test and echocardiogram.  I will also ordered CBC and BMP.  His EKG showed sinus rhythm with frequent PVCs.  I increased his metoprolol to 37.5 during the day and continue his 25 mg in the evening.  Follow-up was planned for after testing.  He presents to the clinic today for follow-up evaluation and states he continues to work 2 to 4 days/week for 4 to 6 hours at a time.  He is planning to [place] another trip to [place] in [place].  He continues to work at [employer].  We reviewed his recent echocardiogram and stress testing.  He expressed understanding.  I will refill his apixaban and we will plan follow-up in 9 to 12 months.  His echocardiogram showed normal LVEF, G2 DD, mildly dilated left atria and no significant valvular abnormalities.  His nuclear stress test 12/18/2022 showed low risk and mildly reduced EF.  No changes from prior study.  Today he denies chest pain, shortness of breath, lower extremity edema, fatigue, palpitations, melena, hematuria, hemoptysis, diaphoresis, weakness, presyncope, syncope, orthopnea, and PND.     Home Medications    Prior to Admission medications   Medication Sig Start Date End Date Taking? Authorizing Provider  acetaminophen (TYLENOL) 500 MG tablet Take 1,000 mg by mouth every 6 (six) hours as needed.    [provider]  albuterol (PROVENTIL) (2.5 MG/3ML) 0.083% nebulizer solution Take 3 mLs (2.5 mg total) by nebulization every 6 (six) hours as needed for up to 15 days  for wheezing or shortness of breath. 03/12/21 01/29/22  Arrien, York Ram, MD  alfuzosin (UROXATRAL) 10  MG 24 hr tablet Take 10 mg by mouth daily. 01/04/22   [provider]  allopurinol (ZYLOPRIM) 300 MG tablet Take 300 mg by mouth daily.    [provider]  amiodarone (PACERONE) 200 MG tablet TAKE ONE-HALF (0.5) TABLET BY MOUTH DAILY 06/13/22   Marykay Lex, MD  apixaban (ELIQUIS) 5 MG TABS tablet Take 5 mg by mouth 2 (two) times daily.    [provider]  baclofen (LIORESAL) 10 MG tablet Take 10 mg by mouth 2 (two) times daily. 03/07/21   [provider]  clonazePAM (KLONOPIN) 0.5 MG tablet Take 0.5 mg by mouth daily. 11/09/20   [provider]  cyclobenzaprine (FLEXERIL) 5 MG tablet Take 1 tablet (5 mg total) by mouth 3 (three) times daily as needed for muscle spasms. 03/13/21   Arrien, York Ram, MD  fluconazole (DIFLUCAN) 100 MG tablet Take 1 tablet (100 mg total) by mouth daily. 03/13/21   Napoleon Form, MD  fluticasone (FLONASE) 50 MCG/ACT nasal spray Place 1 spray into both nostrils daily.    [provider]  Lactobacillus (PROBIOTIC ACIDOPHILUS PO) Take 1 tablet by mouth daily.    [provider]  LORazepam (ATIVAN) 0.5 MG tablet Take 1 tablet (0.5 mg total) by mouth every 6 (six) hours as needed for anxiety (hiccups). 03/12/21   Arrien, York Ram, MD  Melatonin 10 MG TABS Take 10 mg by mouth daily.    [provider]  metoprolol tartrate (LOPRESSOR) 25 MG tablet Take 1 tablet (25 mg total) by mouth 2 (two) times daily. 03/12/21 04/11/21  Arrien, York Ram, MD  mirtazapine (REMERON) 7.5 MG tablet Take 7.5 mg by mouth at bedtime. 10/18/20   [provider]  Multiple Vitamin (MULTIVITAMIN WITH MINERALS) TABS tablet Take 1 tablet by mouth daily.    [provider]  nitroGLYCERIN (NITROSTAT) 0.4 MG SL tablet For chest pain, tightness, or pressure. While sitting, place 1 tablet under tongue. May be used every 5 minutes as needed, for up to 15 minutes. Do not use more than 3 tablets. 11/16/22    Marykay Lex, MD  pantoprazole (PROTONIX) 40 MG tablet Take 1 tablet (40 mg total) by mouth daily for 15 days. 03/13/21 03/28/21  Arrien, York Ram, MD  PARoxetine (PAXIL) 20 MG tablet Take 10 mg by mouth daily. 11/09/20   [provider]  rosuvastatin (CRESTOR) 20 MG tablet TAKE 1/2 TABLET BY MOUTH DAILY 07/30/22   Marykay Lex, MD  testosterone cypionate (DEPOTESTOSTERONE CYPIONATE) 200 MG/ML injection Inject 100 mg into the muscle once a week. 1 ml weekly 01/14/20   [provider]  traMADol-acetaminophen (ULTRACET) 37.5-325 MG tablet Take 2 tablets by mouth every 8 (eight) hours as needed for moderate pain.    [provider]  valsartan (DIOVAN) 160 MG tablet Take 1 tablet (160 mg total) by mouth daily. 09/15/20   Marykay Lex, MD    Family History    Family History  Problem Relation Age of Onset   Deep vein thrombosis Father    Stroke Father    Stroke Brother    Colon cancer Mother    Migraines Neg Hx    Pancreatic cancer Neg Hx    Stomach cancer Neg Hx    He indicated that his mother is deceased. He indicated that his father is deceased. He indicated that the status of his  brother is unknown. He indicated that the status of his neg hx is unknown.  Social History    Social History   Socioeconomic History   Marital status: Married    Spouse name: Not on file   Number of children: 4   Years of education: some master's classes   Highest education level: Bachelor's degree (e.g., BA, AB, BS)  Occupational History   Occupation: CNA  Tobacco Use   Smoking status: Former    Packs/day: 1.00    Years: 25.00    Additional pack years: 0.00    Total pack years: 25.00    Types: Cigarettes    Quit date: 07/13/1980    Years since quitting: 42.6   Smokeless tobacco: Never  Vaping Use   Vaping Use: Never used  Substance and Sexual Activity   Alcohol use: Yes    Comment: RARE   Drug use: No   Sexual activity: Yes    Partners: Female  Other  Topics Concern   Not on file  Social History Narrative   Lives at home with his wife & youngest daughter and her family   Right handed   Drinks 2 cups of coffee daily, occasional cup of tea   Social Determinants of Health   Financial Resource Strain: Not on file  Food Insecurity: Not on file  Transportation Needs: Not on file  Physical Activity: Not on file  Stress: Not on file  Social Connections: Not on file  Intimate Partner Violence: Not on file     Review of Systems    General:  No chills, fever, night sweats or weight changes.  Cardiovascular:  No chest pain, dyspnea on exertion, edema, orthopnea, palpitations, paroxysmal nocturnal dyspnea. Dermatological: No rash, lesions/masses Respiratory: No cough, dyspnea Urologic: No hematuria, dysuria Abdominal:   No nausea, vomiting, diarrhea, bright red blood per rectum, melena, or hematemesis Neurologic:  No visual changes, wkns, changes in mental status. All other systems reviewed and are otherwise negative except as noted above.  Physical Exam    VS:  BP 128/72   Pulse 63   Ht 5\' 10"  (1.778 m)   Wt 245 lb 6.4 oz (111.3 kg)   SpO2 99%   BMI 35.21 kg/m  , BMI Body mass index is 35.21 kg/m. GEN: Well nourished, well developed, in no acute distress. HEENT: normal. Neck: Supple, no JVD, carotid bruits, or masses. Cardiac: RRR, no murmurs, rubs, or gallops. No clubbing, cyanosis, edema.  Radials/DP/PT 2+ and equal bilaterally.  Respiratory:  Respirations regular and unlabored, clear to auscultation bilaterally. GI: Soft, nontender, nondistended, BS + x 4. MS: no deformity or atrophy. Skin: warm and dry, no rash. Neuro:  Strength and sensation are intact. Psych: Normal affect.  Accessory Clinical Findings    Recent Labs: 11/19/2022: BUN 17; Creatinine, Ser 1.34; Hemoglobin 14.9; Platelets 151; Potassium 4.3; Sodium 143   Recent Lipid Panel    Component Value Date/Time   CHOL 98 03/09/2021 0856   CHOL 92 (L)  08/10/2020 0814   TRIG 71 03/09/2021 0856   HDL 35 (L) 03/09/2021 0856   HDL 31 (L) 08/10/2020 0814   CHOLHDL 2.8 03/09/2021 0856   VLDL 14 03/09/2021 0856   LDLCALC 49 03/09/2021 0856   LDLCALC 38 08/10/2020 0814         ECG personally reviewed by me today-none today.  EKG 11/19/2022 sinus rhythm with frequent premature ventricular complexes inferior infarct undetermined age 10 bpm  Echocardiogram 03/10/2021 IMPRESSIONS     1. Frequent  PVCs noted.   2. Left ventricular ejection fraction, by estimation, is 55 to 60%. The  left ventricle has normal function. The left ventricle has no regional  wall motion abnormalities. Left ventricular diastolic parameters were  normal.   3. Right ventricular systolic function is normal. The right ventricular  size is normal. Tricuspid regurgitation signal is inadequate for assessing  PA pressure.   4. The mitral valve is grossly normal. Mild mitral valve regurgitation.  No evidence of mitral stenosis.   5. The aortic valve is grossly normal. Aortic valve regurgitation is not  visualized. No aortic stenosis is present.   6. The inferior vena cava is normal in size with greater than 50%  respiratory variability, suggesting right atrial pressure of 3 mmHg.   Comparison(s): No significant change from prior study.   FINDINGS   Left Ventricle: Left ventricular ejection fraction, by estimation, is 55  to 60%. The left ventricle has normal function. The left ventricle has no  regional wall motion abnormalities. Definity contrast agent was given IV  to delineate the left ventricular   endocardial borders. The left ventricular internal cavity size was normal  in size. There is no left ventricular hypertrophy. Left ventricular  diastolic parameters were normal.   Right Ventricle: The right ventricular size is normal. No increase in  right ventricular wall thickness. Right ventricular systolic function is  normal. Tricuspid regurgitation signal is  inadequate for assessing PA  pressure.   Left Atrium: Left atrial size was normal in size.   Right Atrium: Right atrial size was normal in size.   Pericardium: Trivial pericardial effusion is present.   Mitral Valve: The mitral valve is grossly normal. Mild mitral valve  regurgitation. No evidence of mitral valve stenosis.   Tricuspid Valve: The tricuspid valve is grossly normal. Tricuspid valve  regurgitation is not demonstrated. No evidence of tricuspid stenosis.   Aortic Valve: The aortic valve is grossly normal. Aortic valve  regurgitation is not visualized. No aortic stenosis is present. Aortic  valve mean gradient measures 3.0 mmHg. Aortic valve peak gradient measures  5.7 mmHg. Aortic valve area, by VTI measures  3.76 cm.   Pulmonic Valve: The pulmonic valve was not well visualized.   Aorta: The aortic root is normal in size and structure.   Venous: The inferior vena cava is normal in size with greater than 50%  respiratory variability, suggesting right atrial pressure of 3 mmHg.   IAS/Shunts: The atrial septum is grossly normal.   Echocardiogram 12/18/2022 IMPRESSIONS   1. Left ventricular ejection fraction, by estimation, is 50 to 55%. Left ventricular ejection fraction by PLAX is 52 %. The left ventricle has low normal function. The left ventricle has no regional wall motion abnormalities. Left ventricular diastolic  parameters are consistent with Grade II diastolic dysfunction (pseudonormalization). Elevated left ventricular end-diastolic pressure. 2. Right ventricular systolic function is normal. The right ventricular size is normal. There is normal pulmonary artery systolic pressure. 3. Left atrial size was mildly dilated. 4. The mitral valve is normal in structure. Mild mitral valve regurgitation. No evidence of mitral stenosis. 5. The aortic valve is tricuspid. Aortic valve regurgitation is not visualized. No aortic stenosis is present. 6. The inferior vena cava  is dilated in size with >50% respiratory variability, suggesting right atrial pressure of 8 mmHg.  FINDINGS Left Ventricle: Left ventricular ejection fraction, by estimation, is 50 to 55%. Left ventricular ejection fraction by PLAX is 52 %. The left ventricle has low normal function. The  left ventricle has no regional wall motion abnormalities. The left  ventricular internal cavity size was normal in size. There is no left ventricular hypertrophy. Left ventricular diastolic parameters are consistent with Grade II diastolic dysfunction (pseudonormalization). Elevated left ventricular end-diastolic  pressure.  Right Ventricle: The right ventricular size is normal. No increase in right ventricular wall thickness. Right ventricular systolic function is normal. There is normal pulmonary artery systolic pressure. The tricuspid regurgitant velocity is 2.47 m/s, and with an assumed right atrial pressure of 8 mmHg, the estimated right ventricular systolic pressure is 32.4 mmHg.  Left Atrium: Left atrial size was mildly dilated.  Right Atrium: Right atrial size was normal in size.  Pericardium: There is no evidence of pericardial effusion.  Mitral Valve: The mitral valve is normal in structure. Mild mitral valve regurgitation. No evidence of mitral valve stenosis.  Tricuspid Valve: The tricuspid valve is normal in structure. Tricuspid valve regurgitation is trivial. No evidence of tricuspid stenosis.  Aortic Valve: The aortic valve is tricuspid. Aortic valve regurgitation is not visualized. No aortic stenosis is present.  Pulmonic Valve: The pulmonic valve was normal in structure. Pulmonic valve regurgitation is not visualized. No evidence of pulmonic stenosis.  Aorta: The aortic root is normal in size and structure.  Venous: The inferior vena cava is dilated in size with greater than 50% respiratory variability, suggesting right atrial pressure of 8 mmHg.  IAS/Shunts: No atrial level shunt  detected by color flow Doppler.    Nuclear stress test 09/09/2020 The left ventricular ejection fraction is mildly decreased (45-54%). Nuclear stress EF: 49%. Blood pressure demonstrated a normal response to exercise. There was no ST segment deviation noted during stress. Defect 1: There is a medium defect of severe severity present in the basal inferior and mid inferior location. This is a low risk study.   Abnormal, low risk stress nuclear study with prior inferior infarct versus prominent diaphragmatic attenuation.  No ischemia.  Gated ejection fraction 49% with normal wall motion (LV function appears better than calculated ejection fraction; can consider echocardiogram to further assess if indicated).  Nuclear stress test 12/18/2022    Findings are consistent with infarction. The study is low risk.   Rest ECG shows premature ventricular contractions in bigeminy.   No ST deviation was noted with stress. Arrhythmias during stress: occasional PVCs. Arrhythmias during recovery: frequent PVCs in bigeminy.   LV perfusion is abnormal. There is no evidence of ischemia. There is evidence of infarction. Defect 1: There is a small defect with moderate reduction in uptake present in the apical to mid inferior location(s) that is fixed. There is abnormal wall motion in the defect area. Consistent with infarction.   Left ventricular function is abnormal. Global function is mildly reduced. Nuclear stress EF: 48 %. The left ventricular ejection fraction is mildly decreased (45-54%). End diastolic cavity size is normal. End systolic cavity size is normal. No evidence of transient ischemic dilation (TID) noted.   Prior study available for comparison. No changes compared to prior study.  Assessment & Plan   1.  Chest discomfort, DOE, SOB-denies chest pain today.  Reassuring echocardiogram and nuclear stress test 12/18/2022.  Details above.  Previously noted chest discomfort with exertion while walking and  airport terminal 11/15/2022.   Continues to be somewhat physically active  at [employer] home-improve[employer]. Continue metoprolol, rosuvastatin, valsartan as needed sublingual nitroglycerin Heart healthy low-sodium diet-salty 6 reviewed Increase physical activity as tolerated  Essential hypertension-BP today 128/72. Continue metoprolol, valsartan Heart healthy low-sodium  diet-salty 6 given Increase physical activity as tolerated  Hyperlipidemia-LDL 49 on 03/09/2021 Continue rosuvastatin Heart healthy low-sodium high-fiber diet Increase physical activity as tolerated Repeat fasting lipids and LFTs    Palpitations-denies palpitations.  Tolerating increased metoprolol dose well.   Continue amiodarone,  metoprolol to 37.5 and 20 5 in the afternoon Avoid triggers caffeine, chocolate, EtOH, dehydration etc.   Disposition: Follow-up with Dr. Herbie Baltimore or me in 9-12 months.   Thomasene Ripple. Lucious Zou NP-C     02/14/2023, 1:47 PM [facility] [address] Office [phone] Fax [phone]    I spent 14 minutes examining this patient, reviewing medications, and using patient centered shared decision making involving her cardiac care.  Prior to her visit I spent greater than 20 minutes reviewing her past medical history,  medications, and prior cardiac tests.

## 2023-02-14 ENCOUNTER — Ambulatory Visit: Payer: Medicare Other | Attending: General Practice | Admitting: General Practice

## 2023-02-14 ENCOUNTER — Ambulatory Visit: Payer: Medicare Other | Admitting: General Practice

## 2023-02-14 ENCOUNTER — Encounter: Payer: Self-pay | Admitting: General Practice

## 2023-02-14 VITALS — BP 128/72 | HR 63 | Ht 70.0 in | Wt 245.4 lb

## 2023-02-14 DIAGNOSIS — R0789 Other chest pain: Secondary | ICD-10-CM | POA: Diagnosis not present

## 2023-02-14 DIAGNOSIS — G4700 Insomnia, unspecified: Secondary | ICD-10-CM | POA: Diagnosis not present

## 2023-02-14 DIAGNOSIS — F411 Generalized anxiety disorder: Secondary | ICD-10-CM | POA: Diagnosis not present

## 2023-02-14 DIAGNOSIS — R0609 Other forms of dyspnea: Secondary | ICD-10-CM

## 2023-02-14 DIAGNOSIS — R002 Palpitations: Secondary | ICD-10-CM

## 2023-02-14 DIAGNOSIS — E785 Hyperlipidemia, unspecified: Secondary | ICD-10-CM

## 2023-02-14 DIAGNOSIS — R972 Elevated prostate specific antigen [PSA]: Secondary | ICD-10-CM | POA: Diagnosis not present

## 2023-02-14 DIAGNOSIS — I10 Essential (primary) hypertension: Secondary | ICD-10-CM | POA: Diagnosis not present

## 2023-02-14 DIAGNOSIS — R0602 Shortness of breath: Secondary | ICD-10-CM | POA: Diagnosis not present

## 2023-02-14 MED ORDER — APIXABAN 5 MG PO TABS: 5.0000 mg | ORAL_TABLET | Freq: Two times a day (BID) | ORAL | 12 refills | Status: AC

## 2023-02-14 NOTE — Patient Instructions (Signed)
Medication Instructions:  The current medical regimen is effective;  continue present plan and medications as directed. Please refer to the Current Medication list given to you today.  *If you need a refill on your cardiac medications before your next appointment, please call your pharmacy*  Lab Work: NONE If you have labs (blood work) drawn today and your tests are completely normal, you will receive your results only by:  MyChart Message (if you have MyChart) OR A paper copy in the mail If you have any lab test that is abnormal or we need to change your treatment, we will call you to review the results.  Testing/Procedures: NONE  Follow-Up: At [facility], you and your health needs are our priority.  As part of our continuing mission to provide you with exceptional heart care, we have created designated Provider Care Teams.  These Care Teams include your primary Cardiologist (physician) and Advanced Practice Providers (APPs -  Physician Assistants and Nurse Practitioners) who all work together to provide you with the care you need, when you need it.  Your next appointment:   9-12 month(s)  Provider:   Bryan Lemma, MD     Other Instructions MAINTAIN YOUR PHYSICAL ACTIVITY  PLEASE READ AND FOLLOW ATTACHED  SALTY 6

## 2023-02-14 NOTE — Addendum Note (Signed)
Addended by: Alyson Ingles on: 02/14/2023 02:04 PM   Modules accepted: Orders

## 2023-02-18 DIAGNOSIS — N401 Enlarged prostate with lower urinary tract symptoms: Secondary | ICD-10-CM | POA: Diagnosis not present

## 2023-02-18 DIAGNOSIS — D751 Secondary polycythemia: Secondary | ICD-10-CM | POA: Diagnosis not present

## 2023-02-18 DIAGNOSIS — E291 Testicular hypofunction: Secondary | ICD-10-CM | POA: Diagnosis not present

## 2023-02-18 DIAGNOSIS — R3912 Poor urinary stream: Secondary | ICD-10-CM | POA: Diagnosis not present

## 2023-02-18 DIAGNOSIS — R972 Elevated prostate specific antigen [PSA]: Secondary | ICD-10-CM | POA: Diagnosis not present

## 2023-03-27 DIAGNOSIS — M4722 Other spondylosis with radiculopathy, cervical region: Secondary | ICD-10-CM | POA: Diagnosis not present

## 2023-03-27 DIAGNOSIS — M25511 Pain in right shoulder: Secondary | ICD-10-CM | POA: Diagnosis not present

## 2023-03-27 DIAGNOSIS — M4726 Other spondylosis with radiculopathy, lumbar region: Secondary | ICD-10-CM | POA: Diagnosis not present

## 2023-03-27 DIAGNOSIS — M4802 Spinal stenosis, cervical region: Secondary | ICD-10-CM | POA: Diagnosis not present

## 2023-04-03 DIAGNOSIS — N183 Chronic kidney disease, stage 3 unspecified: Secondary | ICD-10-CM | POA: Diagnosis not present

## 2023-04-03 DIAGNOSIS — D582 Other hemoglobinopathies: Secondary | ICD-10-CM | POA: Diagnosis not present

## 2023-04-03 DIAGNOSIS — I700 Atherosclerosis of aorta: Secondary | ICD-10-CM | POA: Diagnosis not present

## 2023-04-03 DIAGNOSIS — I129 Hypertensive chronic kidney disease with stage 1 through stage 4 chronic kidney disease, or unspecified chronic kidney disease: Secondary | ICD-10-CM | POA: Diagnosis not present

## 2023-04-03 DIAGNOSIS — I480 Paroxysmal atrial fibrillation: Secondary | ICD-10-CM | POA: Diagnosis not present

## 2023-04-03 DIAGNOSIS — F331 Major depressive disorder, recurrent, moderate: Secondary | ICD-10-CM | POA: Diagnosis not present

## 2023-04-03 DIAGNOSIS — D6869 Other thrombophilia: Secondary | ICD-10-CM | POA: Diagnosis not present

## 2023-04-16 ENCOUNTER — Other Ambulatory Visit: Payer: Self-pay | Admitting: Cardiology

## 2023-04-30 DIAGNOSIS — M25811 Other specified joint disorders, right shoulder: Secondary | ICD-10-CM | POA: Diagnosis not present

## 2023-04-30 DIAGNOSIS — M25812 Other specified joint disorders, left shoulder: Secondary | ICD-10-CM | POA: Diagnosis not present

## 2023-05-10 DIAGNOSIS — T24102A Burn of first degree of unspecified site of left lower limb, except ankle and foot, initial encounter: Secondary | ICD-10-CM | POA: Diagnosis not present

## 2023-05-10 DIAGNOSIS — D1724 Benign lipomatous neoplasm of skin and subcutaneous tissue of left leg: Secondary | ICD-10-CM | POA: Diagnosis not present

## 2023-05-13 ENCOUNTER — Other Ambulatory Visit: Payer: Self-pay | Admitting: Physician Assistant

## 2023-05-13 DIAGNOSIS — D1724 Benign lipomatous neoplasm of skin and subcutaneous tissue of left leg: Secondary | ICD-10-CM

## 2023-05-16 DIAGNOSIS — Z6835 Body mass index (BMI) 35.0-35.9, adult: Secondary | ICD-10-CM | POA: Diagnosis not present

## 2023-05-16 DIAGNOSIS — E785 Hyperlipidemia, unspecified: Secondary | ICD-10-CM | POA: Diagnosis not present

## 2023-05-16 DIAGNOSIS — N183 Chronic kidney disease, stage 3 unspecified: Secondary | ICD-10-CM | POA: Diagnosis not present

## 2023-05-23 ENCOUNTER — Ambulatory Visit
Admission: RE | Admit: 2023-05-23 | Discharge: 2023-05-23 | Disposition: A | Discharge status: Home / Self Care | Payer: Medicare Other | Source: Ambulatory Visit | Attending: Physician Assistant | Admitting: Physician Assistant

## 2023-05-23 DIAGNOSIS — D1724 Benign lipomatous neoplasm of skin and subcutaneous tissue of left leg: Secondary | ICD-10-CM

## 2023-05-23 DIAGNOSIS — R2242 Localized swelling, mass and lump, left lower limb: Secondary | ICD-10-CM | POA: Diagnosis not present

## 2023-06-10 DIAGNOSIS — K080 Exfoliation of teeth due to systemic causes: Secondary | ICD-10-CM | POA: Diagnosis not present

## 2023-06-10 DIAGNOSIS — F411 Generalized anxiety disorder: Secondary | ICD-10-CM | POA: Diagnosis not present

## 2023-06-10 DIAGNOSIS — G4700 Insomnia, unspecified: Secondary | ICD-10-CM | POA: Diagnosis not present

## 2023-06-13 ENCOUNTER — Other Ambulatory Visit: Payer: Self-pay

## 2023-06-13 ENCOUNTER — Telehealth: Payer: Self-pay | Admitting: Cardiology

## 2023-06-13 ENCOUNTER — Other Ambulatory Visit: Payer: Self-pay | Admitting: General Practice

## 2023-06-13 DIAGNOSIS — R002 Palpitations: Secondary | ICD-10-CM

## 2023-06-13 MED ORDER — METOPROLOL TARTRATE 25 MG PO TABS: ORAL_TABLET | ORAL | 3 refills | Status: DC

## 2023-06-13 MED ORDER — METOPROLOL TARTRATE 25 MG PO TABS: ORAL_TABLET | ORAL | Status: DC

## 2023-06-13 NOTE — Addendum Note (Signed)
Addended by: Ferne Reus on: 06/13/2023 11:23 AM   Modules accepted: Orders

## 2023-06-13 NOTE — Telephone Encounter (Signed)
Pt c/o medication issue:  1. Name of Medication:   metoprolol tartrate (LOPRESSOR) 25 MG tablet   2. How are you currently taking this medication (dosage and times per day)?   3. Are you having a reaction (difficulty breathing--STAT)?   4. What is your medication issue?   Caller stated they have a question regarding patient's dosage.

## 2023-06-13 NOTE — Telephone Encounter (Signed)
Spoke with Judeth Cornfield and clarified metoprolol dose. Patient is taking 37.5 mg in am and 25 mg in pm. Updated med list to reflect that

## 2023-06-26 DIAGNOSIS — M4802 Spinal stenosis, cervical region: Secondary | ICD-10-CM | POA: Diagnosis not present

## 2023-06-26 DIAGNOSIS — Z79891 Long term (current) use of opiate analgesic: Secondary | ICD-10-CM | POA: Diagnosis not present

## 2023-06-26 DIAGNOSIS — M4722 Other spondylosis with radiculopathy, cervical region: Secondary | ICD-10-CM | POA: Diagnosis not present

## 2023-06-26 DIAGNOSIS — G894 Chronic pain syndrome: Secondary | ICD-10-CM | POA: Diagnosis not present

## 2023-06-26 DIAGNOSIS — M25511 Pain in right shoulder: Secondary | ICD-10-CM | POA: Diagnosis not present

## 2023-06-26 DIAGNOSIS — M4726 Other spondylosis with radiculopathy, lumbar region: Secondary | ICD-10-CM | POA: Diagnosis not present

## 2023-07-07 DIAGNOSIS — J069 Acute upper respiratory infection, unspecified: Secondary | ICD-10-CM | POA: Diagnosis not present

## 2023-07-07 DIAGNOSIS — R051 Acute cough: Secondary | ICD-10-CM | POA: Diagnosis not present

## 2023-07-07 DIAGNOSIS — R0602 Shortness of breath: Secondary | ICD-10-CM | POA: Diagnosis not present

## 2023-07-07 DIAGNOSIS — U071 COVID-19: Secondary | ICD-10-CM | POA: Diagnosis not present

## 2023-07-07 DIAGNOSIS — J22 Unspecified acute lower respiratory infection: Secondary | ICD-10-CM | POA: Diagnosis not present

## 2023-07-24 DIAGNOSIS — K080 Exfoliation of teeth due to systemic causes: Secondary | ICD-10-CM | POA: Diagnosis not present

## 2023-07-31 DIAGNOSIS — Z6834 Body mass index (BMI) 34.0-34.9, adult: Secondary | ICD-10-CM | POA: Diagnosis not present

## 2023-07-31 DIAGNOSIS — E785 Hyperlipidemia, unspecified: Secondary | ICD-10-CM | POA: Diagnosis not present

## 2023-07-31 DIAGNOSIS — N183 Chronic kidney disease, stage 3 unspecified: Secondary | ICD-10-CM | POA: Diagnosis not present

## 2023-07-31 DIAGNOSIS — E668 Other obesity: Secondary | ICD-10-CM | POA: Diagnosis not present

## 2023-08-05 ENCOUNTER — Ambulatory Visit: Payer: Medicare Other | Admitting: Podiatry

## 2023-08-05 ENCOUNTER — Encounter: Payer: Self-pay | Admitting: Podiatry

## 2023-08-05 VITALS — BP 133/62 | HR 49

## 2023-08-05 DIAGNOSIS — N1832 Chronic kidney disease, stage 3b: Secondary | ICD-10-CM | POA: Diagnosis not present

## 2023-08-05 DIAGNOSIS — L603 Nail dystrophy: Secondary | ICD-10-CM | POA: Diagnosis not present

## 2023-08-05 DIAGNOSIS — R972 Elevated prostate specific antigen [PSA]: Secondary | ICD-10-CM | POA: Diagnosis not present

## 2023-08-05 DIAGNOSIS — B351 Tinea unguium: Secondary | ICD-10-CM

## 2023-08-05 DIAGNOSIS — L600 Ingrowing nail: Secondary | ICD-10-CM

## 2023-08-05 DIAGNOSIS — E291 Testicular hypofunction: Secondary | ICD-10-CM | POA: Diagnosis not present

## 2023-08-05 NOTE — Progress Notes (Signed)
Subjective:   Patient ID: [name], male   DOB: 82 y.o.   MRN: [id number]   HPI Patient presents stating that he has had a lot of pain in the ingrown toenail deformity left big toe and states has been very sore and making it hard to wear shoe gear.  Also does have discoloration of the nailbed that is been present for 3 years and does not remember injury does not hurt.  Patient does not smoke likes to be active   Review of Systems  All other systems reviewed and are negative.       Objective:  Physical Exam Vitals and nursing note reviewed.  Constitutional:      Appearance: He is well-developed.  Pulmonary:     Effort: Pulmonary effort is normal.  Musculoskeletal:        General: Normal range of motion.  Skin:    General: Skin is warm.  Neurological:     Mental Status: He is alert.     Neurovascular status intact muscle strength found to be adequate range of motion adequate incurvated medial border of the right big toe painful when pressed no drainage noted and there is some discoloration of the distal nailbed right big toe within the middle portion of the nail.  Good digital perfusion well-oriented x 3     Assessment:  Ingrown toenail deformity right hallux medial border damage to the dorsal right nail surface     Plan:  H&P reviewed discussed condition I am going to go ahead do a biopsy of the discolored portion rule out melanoma and I recommended correction of ingrown which she wants done discussing both conditions.  I had him sign consent form understanding risk I infiltrated the right big toe 60 mg like Marcaine mixture sterile prep done using sterile instrumentation remove the medial border exposed matrix applied phenol 3 applications 30 seconds followed by alcohol by sterile dressing gave instructions on soaks and leave dressing on 24 hours take it off earlier if throbbing were to occur and encouraged to call questions concerns which may arise

## 2023-08-05 NOTE — Patient Instructions (Signed)

## 2023-08-05 NOTE — Addendum Note (Signed)
Addended by: Enedina Finner on: 08/05/2023 05:27 PM   Modules accepted: Orders

## 2023-08-12 DIAGNOSIS — R3912 Poor urinary stream: Secondary | ICD-10-CM | POA: Diagnosis not present

## 2023-08-12 DIAGNOSIS — N401 Enlarged prostate with lower urinary tract symptoms: Secondary | ICD-10-CM | POA: Diagnosis not present

## 2023-08-12 DIAGNOSIS — R972 Elevated prostate specific antigen [PSA]: Secondary | ICD-10-CM | POA: Diagnosis not present

## 2023-08-12 DIAGNOSIS — E291 Testicular hypofunction: Secondary | ICD-10-CM | POA: Diagnosis not present

## 2023-08-14 DIAGNOSIS — Q600 Renal agenesis, unilateral: Secondary | ICD-10-CM | POA: Diagnosis not present

## 2023-08-14 DIAGNOSIS — E875 Hyperkalemia: Secondary | ICD-10-CM | POA: Diagnosis not present

## 2023-08-14 DIAGNOSIS — N400 Enlarged prostate without lower urinary tract symptoms: Secondary | ICD-10-CM | POA: Diagnosis not present

## 2023-08-14 DIAGNOSIS — I129 Hypertensive chronic kidney disease with stage 1 through stage 4 chronic kidney disease, or unspecified chronic kidney disease: Secondary | ICD-10-CM | POA: Diagnosis not present

## 2023-08-14 DIAGNOSIS — N1831 Chronic kidney disease, stage 3a: Secondary | ICD-10-CM | POA: Diagnosis not present

## 2023-08-17 ENCOUNTER — Other Ambulatory Visit: Payer: Self-pay | Admitting: Cardiology

## 2023-08-22 DIAGNOSIS — L578 Other skin changes due to chronic exposure to nonionizing radiation: Secondary | ICD-10-CM | POA: Diagnosis not present

## 2023-08-22 DIAGNOSIS — L570 Actinic keratosis: Secondary | ICD-10-CM | POA: Diagnosis not present

## 2023-08-22 DIAGNOSIS — D2271 Melanocytic nevi of right lower limb, including hip: Secondary | ICD-10-CM | POA: Diagnosis not present

## 2023-08-22 DIAGNOSIS — D225 Melanocytic nevi of trunk: Secondary | ICD-10-CM | POA: Diagnosis not present

## 2023-08-22 DIAGNOSIS — Z85828 Personal history of other malignant neoplasm of skin: Secondary | ICD-10-CM | POA: Diagnosis not present

## 2023-08-22 DIAGNOSIS — L820 Inflamed seborrheic keratosis: Secondary | ICD-10-CM | POA: Diagnosis not present

## 2023-08-25 DIAGNOSIS — J069 Acute upper respiratory infection, unspecified: Secondary | ICD-10-CM | POA: Diagnosis not present

## 2023-08-25 DIAGNOSIS — R051 Acute cough: Secondary | ICD-10-CM | POA: Diagnosis not present

## 2023-08-25 DIAGNOSIS — Z03818 Encounter for observation for suspected exposure to other biological agents ruled out: Secondary | ICD-10-CM | POA: Diagnosis not present

## 2023-09-11 DIAGNOSIS — M25811 Other specified joint disorders, right shoulder: Secondary | ICD-10-CM | POA: Diagnosis not present

## 2023-09-11 DIAGNOSIS — M25812 Other specified joint disorders, left shoulder: Secondary | ICD-10-CM | POA: Diagnosis not present

## 2023-09-17 DIAGNOSIS — R3915 Urgency of urination: Secondary | ICD-10-CM | POA: Diagnosis not present

## 2023-09-18 DIAGNOSIS — F411 Generalized anxiety disorder: Secondary | ICD-10-CM | POA: Diagnosis not present

## 2023-09-25 DIAGNOSIS — M25511 Pain in right shoulder: Secondary | ICD-10-CM | POA: Diagnosis not present

## 2023-09-25 DIAGNOSIS — M4726 Other spondylosis with radiculopathy, lumbar region: Secondary | ICD-10-CM | POA: Diagnosis not present

## 2023-09-25 DIAGNOSIS — M4802 Spinal stenosis, cervical region: Secondary | ICD-10-CM | POA: Diagnosis not present

## 2023-09-25 DIAGNOSIS — M4722 Other spondylosis with radiculopathy, cervical region: Secondary | ICD-10-CM | POA: Diagnosis not present

## 2023-09-30 DIAGNOSIS — N183 Chronic kidney disease, stage 3 unspecified: Secondary | ICD-10-CM | POA: Diagnosis not present

## 2023-09-30 DIAGNOSIS — E785 Hyperlipidemia, unspecified: Secondary | ICD-10-CM | POA: Diagnosis not present

## 2023-09-30 DIAGNOSIS — I129 Hypertensive chronic kidney disease with stage 1 through stage 4 chronic kidney disease, or unspecified chronic kidney disease: Secondary | ICD-10-CM | POA: Diagnosis not present

## 2023-09-30 DIAGNOSIS — E6689 Other obesity not elsewhere classified: Secondary | ICD-10-CM | POA: Diagnosis not present

## 2023-10-17 DIAGNOSIS — Z0000 Encounter for general adult medical examination without abnormal findings: Secondary | ICD-10-CM | POA: Diagnosis not present

## 2023-10-17 DIAGNOSIS — E785 Hyperlipidemia, unspecified: Secondary | ICD-10-CM | POA: Diagnosis not present

## 2023-10-17 DIAGNOSIS — D6869 Other thrombophilia: Secondary | ICD-10-CM | POA: Diagnosis not present

## 2023-10-17 DIAGNOSIS — M109 Gout, unspecified: Secondary | ICD-10-CM | POA: Diagnosis not present

## 2023-10-17 DIAGNOSIS — N183 Chronic kidney disease, stage 3 unspecified: Secondary | ICD-10-CM | POA: Diagnosis not present

## 2023-10-17 DIAGNOSIS — I129 Hypertensive chronic kidney disease with stage 1 through stage 4 chronic kidney disease, or unspecified chronic kidney disease: Secondary | ICD-10-CM | POA: Diagnosis not present

## 2023-10-17 DIAGNOSIS — I480 Paroxysmal atrial fibrillation: Secondary | ICD-10-CM | POA: Diagnosis not present

## 2023-11-04 DIAGNOSIS — J101 Influenza due to other identified influenza virus with other respiratory manifestations: Secondary | ICD-10-CM | POA: Diagnosis not present

## 2023-11-13 ENCOUNTER — Ambulatory Visit: Payer: Medicare Other | Attending: Cardiology | Admitting: Cardiology

## 2023-11-13 VITALS — BP 118/60 | HR 65 | Ht 69.0 in | Wt 245.0 lb

## 2023-11-13 DIAGNOSIS — I2510 Atherosclerotic heart disease of native coronary artery without angina pectoris: Secondary | ICD-10-CM

## 2023-11-13 DIAGNOSIS — R052 Subacute cough: Secondary | ICD-10-CM | POA: Diagnosis not present

## 2023-11-13 DIAGNOSIS — Z7901 Long term (current) use of anticoagulants: Secondary | ICD-10-CM

## 2023-11-13 DIAGNOSIS — I10 Essential (primary) hypertension: Secondary | ICD-10-CM | POA: Diagnosis not present

## 2023-11-13 DIAGNOSIS — Z79899 Other long term (current) drug therapy: Secondary | ICD-10-CM | POA: Diagnosis not present

## 2023-11-13 DIAGNOSIS — I4729 Other ventricular tachycardia: Secondary | ICD-10-CM | POA: Diagnosis not present

## 2023-11-13 DIAGNOSIS — I493 Ventricular premature depolarization: Secondary | ICD-10-CM

## 2023-11-13 DIAGNOSIS — E785 Hyperlipidemia, unspecified: Secondary | ICD-10-CM

## 2023-11-13 NOTE — Progress Notes (Signed)
 Cardiology Office Note:  .   Date:  11/17/2023  ID:  [name], DOB June 04, 1941, MRN [id number] PCP: Teresa Channel, MD  [facility] Providers Cardiologist:  Joseph Clay, MD     No chief complaint on file.  Patient Profile: .     [name] is a obese 83 y.o. 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  PMH: CAD -> Abn Cor CTA 05/2018 -> Cath with LAD PCI 06/2018 HTN, HLD Most recent Myoview  February 2024, nonischemic Recurrent DVT-PE-lifelong DOAC Frequent PVCs with occasional bursts of NSVT-on amiodarone  Obesity with OSA-CPAP    I last saw [name] back in March 2023. He was seen in January and April 2024 by Joseph Beauvais, NP for evaluation of atypical chest pain--nonexertional chest discomfort and dyspnea.  This was evaluated with 2D echo and Myoview  (reviewed below).  On follow-up he was doing well with no recurrent symptoms.  Still Working at [employer], still active with no active symptoms.   Subjective  Discussed the use of AI scribe software for clinical note transcription with the patient, who gave verbal consent to proceed.  History of Present Illness   The patient, an 83 year old with a history of coronary artery disease, hypertension, and PVCs, presents for a routine follow-up. He reports no new health issues since the last visit. The patient's coronary artery disease, managed with a stent placement in the LAD two years ago, remains stable with no reported angina or CHF Sx. His hypertension is well-controlled on metoprolol  and valsartan . The patient's cholesterol levels are well-managed on rosuvastatin .   The patient's PVCs, previously resulting in non-sustained ventricular tachycardia, are well-controlled on metoprolol  and  amiodarone . The patient has not had any further episodes of non-sustained ventricular tachycardia. He is also on apixaban  for a history of DVTs.   The patient reports slight swelling in his legs when standing for extended periods, which resolves upon elevation.    He recently retired due to fatigue following a bout of COVID-19 and pneumonia. The patient also has a history of four small strokes, resulting in vision loss in the lower half of his right eye. He has been on amiodarone  for approximately 30 years. The patient's annual liver function and thyroid  function tests are managed by his primary care physician.      Cardiovascular ROS: no chest pain or dyspnea on exertion positive for - edema, irregular heartbeat, palpitations, and edema is trivial as noted in HPI.  Palpitations also relatively rare and well-controlled. negative for - orthopnea, paroxysmal nocturnal dyspnea, rapid heart rate, shortness of breath, or lightheadedness, dizziness and wooziness, syncope or near syncope, TIA or emesis fugax, claudication.  ROS:  Review of Systems - Negative except symptoms are in HPI    Objective   Current Meds  Medication Sig   acetaminophen  (TYLENOL ) 500 MG tablet Take 1,000 mg by mouth every 6 (six) hours as needed.   alfuzosin (UROXATRAL) 10 MG 24 hr tablet Take 10 mg by mouth daily.   allopurinol  (ZYLOPRIM ) 300 MG tablet Take 300 mg by mouth daily.   amiodarone  (PACERONE ) 200 MG tablet TAKE 1/2 TABLET BY MOUTH DAILY   apixaban  (ELIQUIS ) 5 MG TABS tablet Take 1 tablet (5 mg total) by mouth 2 (two) times daily.  baclofen (LIORESAL) 10 MG tablet Take 10 mg by mouth 2 (two) times daily.   clonazePAM  (KLONOPIN ) 0.5 MG tablet Take 0.5 mg by mouth daily.   doxycycline (VIBRA-TABS) 100 MG tablet Take 100 mg by mouth 2 (two) times daily.   LAGEVRIO 200 MG CAPS capsule    Melatonin 10 MG TABS Take 10 mg by mouth daily.   methocarbamol  (ROBAXIN ) 500 MG tablet Take 500 mg by mouth as needed for  muscle spasms.   metoprolol  tartrate (LOPRESSOR ) 25 MG tablet Take 1.5 tablets in the am (37.5 mg) total and 1 tablet  (25 mg) total in the evening.   mirtazapine  (REMERON ) 7.5 MG tablet Take 7.5 mg by mouth at bedtime.   Multiple Vitamin (MULTIVITAMIN WITH MINERALS) TABS tablet Take 1 tablet by mouth daily.   nitroGLYCERIN  (NITROSTAT ) 0.4 MG SL tablet For chest pain, tightness, or pressure. While sitting, place 1 tablet under tongue. May be used every 5 minutes as needed, for up to 15 minutes. Do not use more than 3 tablets.   PARoxetine  (PAXIL ) 20 MG tablet Take 10 mg by mouth daily.   rosuvastatin  (CRESTOR ) 20 MG tablet TAKE 1/2 TABLET BY MOUTH DAILY   testosterone  cypionate (DEPOTESTOSTERONE CYPIONATE) 200 MG/ML injection Inject 100 mg into the muscle once a week. 1 ml weekly   traMADol (ULTRAM) 50 MG tablet    valsartan  (DIOVAN ) 160 MG tablet Take 1 tablet (160 mg total) by mouth daily.    Studies Reviewed: SABRA   EKG Interpretation Date/Time:  Wednesday November 13 2023 17:02:24 EST Ventricular Rate:  64 PR Interval:  156 QRS Duration:  74 QT Interval:  428 QTC Calculation: 441 R Axis:   -31  Text Interpretation: Normal sinus rhythm Left axis deviation Inferior infarct , age undetermined When compared with ECG of 08-Mar-2021 17:20, Premature ventricular complexes NO LONGER PRESENT Artifact Otherwise no significant change Confirmed by Anner Lenis ([id number]) on 11/13/2023 5:40:09 PM   ECHO 12/18/2022: LVEF 50 to 55%.  No RWMA.  GR 2 DD with elevated LVEDP.  Mild LA dilation; normal RV and RVP with mildly elevated RAP.  Normal valves.  Myoview  12/18/2022: LOW RISK; NO Ischemia.  Occasional PVCs noted during stress and recovery frequent PVCs with bigeminy noted also in recovery.  No ischemic changes noted.  Small fixed apical to mid inferior defect with apical wall motion abnormality consistent with prior infarct.  EF estimated 48%.  CATH 06/2018: Prox LAD 30%, mid LAD 90%(DES PCI Osiro 2.75 x 18  -> 3.0 mm), ost D1 95% (jailed); Prox-mid LCx 40%, Ost OM1 50%, Ost OM2 40% & 50% prox OM2; Prox RCA ~25%.    Lab Results  Component Value Date   CHOL 98 03/09/2021   HDL 35 (L) 03/09/2021   LDLCALC 49 03/09/2021   TRIG 71 03/09/2021   CHOLHDL 2.8 03/09/2021   Total cholesterol: 125 (10/17/2023) HDL: 50 (10/17/2023) LDL: 60 (10/17/2023) Triglycerides: 70 (10/17/2023) Creatinine: 1.46 (10/17/2023) Hemoglobin: 15.6 (10/17/2023) TSH: 5.3 (10/17/2023)  Lab Results  Component Value Date   NA 143 11/19/2022   K 4.3 11/19/2022   CREATININE 1.34 (H) 11/19/2022   EGFR 54 (L) 11/19/2022   GLUCOSE 89 11/19/2022   Lab Results  Component Value Date   WBC 5.5 11/19/2022   HGB 14.9 11/19/2022   HCT 43.6 11/19/2022   MCV 95 11/19/2022   PLT 151 11/19/2022   Risk Assessment/Calculations:            Physical Exam:   VS:  Pulse  65   Ht 5' 9 (1.753 m)   Wt 245 lb (111.1 kg)   SpO2 94%   BMI 36.18 kg/m    Wt Readings from Last 3 Encounters:  11/13/23 245 lb (111.1 kg)  02/14/23 245 lb 6.4 oz (111.3 kg)  11/19/22 257 lb 14.4 oz (117 kg)    GEN: Well nourished, well developed in no acute distress; other than mildly obese.  Healthy-appearing.  Well-groomed. NECK: No JVD; No carotid bruits CARDIAC: Normal S1, S2; RRR, no murmurs, rubs, gallops RESPIRATORY:  Clear to auscultation without rales, wheezing or rhonchi ; nonlabored, good air movement. ABDOMEN: Soft, non-tender, non-distended EXTREMITIES:  trivial ankle edema; No deformity     ASSESSMENT AND PLAN: .    Problem List Items Addressed This Visit       Cardiology Problems   Coronary artery disease involving native coronary artery of native heart without angina pectoris - Primary (Chronic)   Prior myocardial infarction with stent placement in distal LAD. Stable with no angina. -Continue current medications:Metoprolol  37.5mg  in the morning and 25mg  in the evening, Valsartan  160mg  daily, Rosuvastatin  20mg  daily. -No  longer on antiplatelet agents due to being on longstanding DOAC.      Relevant Orders   C-reactive protein (Completed)   Pulmonary Function Test   Essential hypertension (Chronic)   Well controlled on Metoprolol  and Valsartan . -Continue Metoprolol  and Valsartan  as above.      Relevant Orders   EKG 12-Lead (Completed)   C-reactive protein (Completed)   Frequent unifocal PVCs (Chronic)   Stable on beta-blocker and amiodarone .      Hyperlipidemia with target low density lipoprotein (LDL) cholesterol less than 55 mg/dL (Chronic)   Well controlled on Rosuvastatin  with LDL of 60 -Continue Rosuvastatin  20mg  daily.      Nonsustained ventricular tachycardia (HCC) (Chronic)   Stable on Amiodarone  and Metoprolol . -Continue Amiodarone  200mg  1/2 tablet daily and Metoprolol  as above. -Continue annual liver function and thyroid  function tests. -Order Pulmonary Function Tests in May 2025 to monitor for Amiodarone  toxicity.        Other   Chronic anticoagulation (Chronic)   Recurrent DVTs and PEs.  Stable on apixaban .  No bleeding issues.  Continue apixaban , but okay to hold for procedures or surgeries.      On amiodarone  therapy (Chronic)   Standard amiodarone  monitoring with annual eye exams, LFTs, TFTs.  And for pulmonary toxicity evaluation will check ESR and CRP, and he is due for PFTs this summer.      Other Visit Diagnoses       Long term current use of amiodarone        Relevant Orders   C-reactive protein (Completed)   Sedimentation rate (Completed)   Pulmonary Function Test     Medication management       Relevant Orders   C-reactive protein (Completed)   Sedimentation rate (Completed)   Pulmonary Function Test          Follow-Up: Return in about 1 year (around 11/12/2024) for 1 Yr Follow-up.    Signed, Joseph MICAEL Clay, MD, MS [name], M.D., M.S. Interventional Cardiologist  [facility]  Pager # [phone] Phone # [phone] [address] [place], [place] [address]

## 2023-11-13 NOTE — Patient Instructions (Addendum)
 Medication Instructions:   No changes   *If you need a refill on your cardiac medications before your next appointment, please call your pharmacy*   Lab Work:  Not  needed at present  Please have primary  to order C-RP, Sed rate next time you have annual testing.     Testing/Procedures:   Will be schedule in May 2025  at [facility]  Your physician has recommended that you have a pulmonary function test. Pulmonary Function Tests are a group of tests that measure how well air moves in and out of your lungs.    instructions On the day of your test...   Do not eat or drink anything, including caffeine, for at least 3 hours prior to testing.  You may drink water  as needed. If you are diabetic and need to eat, you may consume a light snack no less than 2 hours prior to testing.  Take your medications as normal unless specifically instructed differently by your doctor.  If you use an inhaler, bring it with you.  Dress in loose fitting, comfortable clothes that are appropriate for exercise.  Avoid wearing a skirt, dress, high heels or one-piece body suit.    Feel free to bring a bottle of water  for after the test.      Follow-Up: At [facility], you and your health needs are our priority.  As part of our continuing mission to provide you with exceptional heart care, we have created designated Provider Care Teams.  These Care Teams include your primary Cardiologist (physician) and Advanced Practice Providers (APPs -  Physician Assistants and Nurse Practitioners) who all work together to provide you with the care you need, when you need it.     Your next appointment:   12 month(s)  The format for your next appointment:   In Person  Provider:   Alm Clay, MD

## 2023-11-15 DIAGNOSIS — Z79899 Other long term (current) drug therapy: Secondary | ICD-10-CM | POA: Diagnosis not present

## 2023-11-15 DIAGNOSIS — Z9861 Coronary angioplasty status: Secondary | ICD-10-CM | POA: Diagnosis not present

## 2023-11-15 DIAGNOSIS — I10 Essential (primary) hypertension: Secondary | ICD-10-CM | POA: Diagnosis not present

## 2023-11-15 DIAGNOSIS — I2510 Atherosclerotic heart disease of native coronary artery without angina pectoris: Secondary | ICD-10-CM | POA: Diagnosis not present

## 2023-11-16 LAB — SEDIMENTATION RATE: Sed Rate: 10 mm/h (ref 0–30)

## 2023-11-16 LAB — C-REACTIVE PROTEIN: CRP: 8 mg/L (ref 0–10)

## 2023-11-17 ENCOUNTER — Encounter: Payer: Self-pay | Admitting: Cardiology

## 2023-11-17 NOTE — Assessment & Plan Note (Signed)
 Standard amiodarone monitoring with annual eye exams, LFTs, TFTs.  And for pulmonary toxicity evaluation will check ESR and CRP, and he is due for PFTs this summer.

## 2023-11-17 NOTE — Assessment & Plan Note (Signed)
 Prior myocardial infarction with stent placement in distal LAD. Stable with no angina. -Continue current medications:Metoprolol  37.5mg  in the morning and 25mg  in the evening, Valsartan  160mg  daily, Rosuvastatin  20mg  daily. -No longer on antiplatelet agents due to being on longstanding DOAC.

## 2023-11-17 NOTE — Assessment & Plan Note (Signed)
 Recurrent DVTs and PEs.  Stable on apixaban.  No bleeding issues.  Continue apixaban, but okay to hold for procedures or surgeries.

## 2023-11-17 NOTE — Assessment & Plan Note (Signed)
 Well controlled on Rosuvastatin with LDL of 60 -Continue Rosuvastatin 20mg  daily.

## 2023-11-17 NOTE — Assessment & Plan Note (Signed)
 Stable on beta-blocker and amiodarone.

## 2023-11-17 NOTE — Assessment & Plan Note (Signed)
 Stable on Amiodarone and Metoprolol. -Continue Amiodarone 200mg  1/2 tablet daily and Metoprolol as above. -Continue annual liver function and thyroid function tests. -Order Pulmonary Function Tests in May 2025 to monitor for Amiodarone toxicity.

## 2023-11-17 NOTE — Assessment & Plan Note (Signed)
 Well controlled on Metoprolol and Valsartan. -Continue Metoprolol and Valsartan as above.

## 2023-12-17 DIAGNOSIS — K080 Exfoliation of teeth due to systemic causes: Secondary | ICD-10-CM | POA: Diagnosis not present

## 2023-12-19 DIAGNOSIS — M5416 Radiculopathy, lumbar region: Secondary | ICD-10-CM | POA: Diagnosis not present

## 2023-12-20 DIAGNOSIS — M545 Low back pain, unspecified: Secondary | ICD-10-CM | POA: Diagnosis not present

## 2023-12-24 DIAGNOSIS — M545 Low back pain, unspecified: Secondary | ICD-10-CM | POA: Diagnosis not present

## 2023-12-30 DIAGNOSIS — R5383 Other fatigue: Secondary | ICD-10-CM | POA: Diagnosis not present

## 2023-12-30 DIAGNOSIS — R946 Abnormal results of thyroid function studies: Secondary | ICD-10-CM | POA: Diagnosis not present

## 2023-12-30 DIAGNOSIS — G4733 Obstructive sleep apnea (adult) (pediatric): Secondary | ICD-10-CM | POA: Diagnosis not present

## 2023-12-30 DIAGNOSIS — I480 Paroxysmal atrial fibrillation: Secondary | ICD-10-CM | POA: Diagnosis not present

## 2023-12-30 DIAGNOSIS — F331 Major depressive disorder, recurrent, moderate: Secondary | ICD-10-CM | POA: Diagnosis not present

## 2023-12-31 DIAGNOSIS — M545 Low back pain, unspecified: Secondary | ICD-10-CM | POA: Diagnosis not present

## 2024-01-07 DIAGNOSIS — M545 Low back pain, unspecified: Secondary | ICD-10-CM | POA: Diagnosis not present

## 2024-01-09 DIAGNOSIS — M25811 Other specified joint disorders, right shoulder: Secondary | ICD-10-CM | POA: Diagnosis not present

## 2024-01-09 DIAGNOSIS — M25812 Other specified joint disorders, left shoulder: Secondary | ICD-10-CM | POA: Diagnosis not present

## 2024-01-13 ENCOUNTER — Other Ambulatory Visit: Payer: Self-pay | Admitting: Cardiology

## 2024-01-14 DIAGNOSIS — M545 Low back pain, unspecified: Secondary | ICD-10-CM | POA: Diagnosis not present

## 2024-02-15 ENCOUNTER — Other Ambulatory Visit: Payer: Self-pay | Admitting: Cardiology

## 2024-02-17 ENCOUNTER — Telehealth: Payer: Self-pay | Admitting: Cardiology

## 2024-02-17 DIAGNOSIS — E039 Hypothyroidism, unspecified: Secondary | ICD-10-CM | POA: Diagnosis not present

## 2024-02-17 MED ORDER — AMIODARONE HCL 200 MG PO TABS: 100.0000 mg | ORAL_TABLET | Freq: Every day | ORAL | 2 refills | Status: DC

## 2024-02-17 NOTE — Telephone Encounter (Signed)
*  STAT* If patient is at the pharmacy, call can be transferred to refill team.   1. Which medications need to be refilled? (please list name of each medication and dose if known) amiodarone (PACERONE) 200 MG tablet   2. Which pharmacy/location (including street and city if local pharmacy) is medication to be sent to? [facility] [id number] [place], [place] - [address]   3. Do they need a 30 day or 90 day supply? 90

## 2024-03-02 DIAGNOSIS — E291 Testicular hypofunction: Secondary | ICD-10-CM | POA: Diagnosis not present

## 2024-03-04 DIAGNOSIS — L578 Other skin changes due to chronic exposure to nonionizing radiation: Secondary | ICD-10-CM | POA: Diagnosis not present

## 2024-03-04 DIAGNOSIS — D485 Neoplasm of uncertain behavior of skin: Secondary | ICD-10-CM | POA: Diagnosis not present

## 2024-03-04 DIAGNOSIS — L820 Inflamed seborrheic keratosis: Secondary | ICD-10-CM | POA: Diagnosis not present

## 2024-03-04 DIAGNOSIS — L821 Other seborrheic keratosis: Secondary | ICD-10-CM | POA: Diagnosis not present

## 2024-03-04 DIAGNOSIS — L570 Actinic keratosis: Secondary | ICD-10-CM | POA: Diagnosis not present

## 2024-03-04 DIAGNOSIS — Z85828 Personal history of other malignant neoplasm of skin: Secondary | ICD-10-CM | POA: Diagnosis not present

## 2024-03-04 DIAGNOSIS — D225 Melanocytic nevi of trunk: Secondary | ICD-10-CM | POA: Diagnosis not present

## 2024-03-09 DIAGNOSIS — R3915 Urgency of urination: Secondary | ICD-10-CM | POA: Diagnosis not present

## 2024-03-09 DIAGNOSIS — N401 Enlarged prostate with lower urinary tract symptoms: Secondary | ICD-10-CM | POA: Diagnosis not present

## 2024-03-09 DIAGNOSIS — E291 Testicular hypofunction: Secondary | ICD-10-CM | POA: Diagnosis not present

## 2024-03-10 DIAGNOSIS — M4722 Other spondylosis with radiculopathy, cervical region: Secondary | ICD-10-CM | POA: Diagnosis not present

## 2024-03-10 DIAGNOSIS — M4726 Other spondylosis with radiculopathy, lumbar region: Secondary | ICD-10-CM | POA: Diagnosis not present

## 2024-03-10 DIAGNOSIS — M25511 Pain in right shoulder: Secondary | ICD-10-CM | POA: Diagnosis not present

## 2024-03-10 DIAGNOSIS — M4802 Spinal stenosis, cervical region: Secondary | ICD-10-CM | POA: Diagnosis not present

## 2024-03-16 DIAGNOSIS — F411 Generalized anxiety disorder: Secondary | ICD-10-CM | POA: Diagnosis not present

## 2024-03-16 DIAGNOSIS — F330 Major depressive disorder, recurrent, mild: Secondary | ICD-10-CM | POA: Diagnosis not present

## 2024-04-06 DIAGNOSIS — N1832 Chronic kidney disease, stage 3b: Secondary | ICD-10-CM | POA: Diagnosis not present

## 2024-04-13 DIAGNOSIS — N400 Enlarged prostate without lower urinary tract symptoms: Secondary | ICD-10-CM | POA: Diagnosis not present

## 2024-04-13 DIAGNOSIS — N1832 Chronic kidney disease, stage 3b: Secondary | ICD-10-CM | POA: Diagnosis not present

## 2024-04-13 DIAGNOSIS — I129 Hypertensive chronic kidney disease with stage 1 through stage 4 chronic kidney disease, or unspecified chronic kidney disease: Secondary | ICD-10-CM | POA: Diagnosis not present

## 2024-04-13 DIAGNOSIS — Q600 Renal agenesis, unilateral: Secondary | ICD-10-CM | POA: Diagnosis not present

## 2024-04-14 DIAGNOSIS — M25811 Other specified joint disorders, right shoulder: Secondary | ICD-10-CM | POA: Diagnosis not present

## 2024-04-14 DIAGNOSIS — M25812 Other specified joint disorders, left shoulder: Secondary | ICD-10-CM | POA: Diagnosis not present

## 2024-04-23 DIAGNOSIS — I129 Hypertensive chronic kidney disease with stage 1 through stage 4 chronic kidney disease, or unspecified chronic kidney disease: Secondary | ICD-10-CM | POA: Diagnosis not present

## 2024-04-23 DIAGNOSIS — E785 Hyperlipidemia, unspecified: Secondary | ICD-10-CM | POA: Diagnosis not present

## 2024-04-23 DIAGNOSIS — I480 Paroxysmal atrial fibrillation: Secondary | ICD-10-CM | POA: Diagnosis not present

## 2024-04-23 DIAGNOSIS — N183 Chronic kidney disease, stage 3 unspecified: Secondary | ICD-10-CM | POA: Diagnosis not present

## 2024-04-23 DIAGNOSIS — R710 Precipitous drop in hematocrit: Secondary | ICD-10-CM | POA: Diagnosis not present

## 2024-06-08 ENCOUNTER — Other Ambulatory Visit: Payer: Self-pay | Admitting: General Practice

## 2024-06-08 DIAGNOSIS — R002 Palpitations: Secondary | ICD-10-CM

## 2024-06-29 DIAGNOSIS — M25552 Pain in left hip: Secondary | ICD-10-CM | POA: Diagnosis not present

## 2024-07-02 DIAGNOSIS — L821 Other seborrheic keratosis: Secondary | ICD-10-CM | POA: Diagnosis not present

## 2024-07-02 DIAGNOSIS — K080 Exfoliation of teeth due to systemic causes: Secondary | ICD-10-CM | POA: Diagnosis not present

## 2024-07-02 DIAGNOSIS — L309 Dermatitis, unspecified: Secondary | ICD-10-CM | POA: Diagnosis not present

## 2024-07-08 DIAGNOSIS — M25552 Pain in left hip: Secondary | ICD-10-CM | POA: Diagnosis not present

## 2024-07-10 DIAGNOSIS — M76892 Other specified enthesopathies of left lower limb, excluding foot: Secondary | ICD-10-CM | POA: Diagnosis not present

## 2024-07-10 DIAGNOSIS — M76899 Other specified enthesopathies of unspecified lower limb, excluding foot: Secondary | ICD-10-CM | POA: Diagnosis not present

## 2024-07-10 DIAGNOSIS — M25552 Pain in left hip: Secondary | ICD-10-CM | POA: Diagnosis not present

## 2024-07-28 DIAGNOSIS — S76312A Strain of muscle, fascia and tendon of the posterior muscle group at thigh level, left thigh, initial encounter: Secondary | ICD-10-CM | POA: Diagnosis not present

## 2024-08-06 DIAGNOSIS — M25552 Pain in left hip: Secondary | ICD-10-CM | POA: Diagnosis not present

## 2024-08-13 DIAGNOSIS — S76312D Strain of muscle, fascia and tendon of the posterior muscle group at thigh level, left thigh, subsequent encounter: Secondary | ICD-10-CM | POA: Diagnosis not present

## 2024-08-17 DIAGNOSIS — G894 Chronic pain syndrome: Secondary | ICD-10-CM | POA: Diagnosis not present

## 2024-08-17 DIAGNOSIS — M4802 Spinal stenosis, cervical region: Secondary | ICD-10-CM | POA: Diagnosis not present

## 2024-08-17 DIAGNOSIS — M25511 Pain in right shoulder: Secondary | ICD-10-CM | POA: Diagnosis not present

## 2024-08-17 DIAGNOSIS — M4726 Other spondylosis with radiculopathy, lumbar region: Secondary | ICD-10-CM | POA: Diagnosis not present

## 2024-08-17 DIAGNOSIS — Z79891 Long term (current) use of opiate analgesic: Secondary | ICD-10-CM | POA: Diagnosis not present

## 2024-08-17 DIAGNOSIS — M4722 Other spondylosis with radiculopathy, cervical region: Secondary | ICD-10-CM | POA: Diagnosis not present

## 2024-08-20 DIAGNOSIS — M25552 Pain in left hip: Secondary | ICD-10-CM | POA: Diagnosis not present

## 2024-08-27 ENCOUNTER — Telehealth: Payer: Self-pay | Admitting: Cardiology

## 2024-08-27 DIAGNOSIS — R609 Edema, unspecified: Secondary | ICD-10-CM

## 2024-08-27 DIAGNOSIS — I10 Essential (primary) hypertension: Secondary | ICD-10-CM

## 2024-08-27 NOTE — Telephone Encounter (Signed)
 Lets see if we can get him in to be seen if either by me or by an APP.   If we can have him come in on the day of his appointment and get a full chemistry panel and BNP prior to the visit so we can know how to appropriately adjust medications.  I do not see that he is on a diuretic, we can give him a short course of Lasix  40 mg daily for 3 days, then every other day for 6 days.  Alm Clay, MD

## 2024-08-27 NOTE — Telephone Encounter (Signed)
 Spoke with pt, he reports swelling in his lower legs for about 3 weeks or more. He reports they are swollen in the morning when he gets up. There have been no changes in diet or medications. He is SOB when bending over but none with activity. He has been trying to loos weight and has gained 10 lbs. He is elevating his legs when sitting and he is not able to get his compression hose on right now. He is overdue for PFT's for amiodarone . He denies any palpitations. Aware will forward for dr harding's review

## 2024-08-27 NOTE — Telephone Encounter (Signed)
 Pt c/o swelling/edema: STAT if pt has developed SOB within 24 hours  If swelling, where is the swelling located? Legs   How much weight have you gained and in what time span? States about 10 lbs in a month   Have you gained 2 pounds in a day or 5 pounds in a week? No   Do you have a log of your daily weights (if so, list)? Did not record it.   Are you currently taking a fluid pill? No   Are you currently SOB? No   Have you traveled recently in a car or plane for an extended period of time? No

## 2024-08-28 DIAGNOSIS — R609 Edema, unspecified: Secondary | ICD-10-CM | POA: Diagnosis not present

## 2024-08-28 DIAGNOSIS — I10 Essential (primary) hypertension: Secondary | ICD-10-CM | POA: Diagnosis not present

## 2024-08-28 MED ORDER — FUROSEMIDE 40 MG PO TABS: ORAL_TABLET | ORAL | 3 refills | Status: DC

## 2024-08-28 NOTE — Telephone Encounter (Signed)
 Spoke with pt regarding Dr. Genice suggestions. Pt scheduled 10/28 to see APP. CMP and BNP ordered and released. Pt planning to come today to have labs drawn. Lasix  ordered. Pt aware of instructions. Pt verbalized understanding. All questions if any were answered.

## 2024-08-29 LAB — COMPREHENSIVE METABOLIC PANEL WITH GFR
ALT: 16 IU/L (ref 0–44)
AST: 23 IU/L (ref 0–40)
Albumin: 4.2 g/dL (ref 3.7–4.7)
Alkaline Phosphatase: 68 IU/L (ref 48–129)
BUN/Creatinine Ratio: 17 (ref 10–24)
BUN: 30 mg/dL — ABNORMAL HIGH (ref 8–27)
Bilirubin Total: 0.3 mg/dL (ref 0.0–1.2)
CO2: 24 mmol/L (ref 20–29)
Calcium: 9.5 mg/dL (ref 8.6–10.2)
Chloride: 106 mmol/L (ref 96–106)
Creatinine, Ser: 1.79 mg/dL — ABNORMAL HIGH (ref 0.76–1.27)
Globulin, Total: 2.2 g/dL (ref 1.5–4.5)
Glucose: 122 mg/dL — ABNORMAL HIGH (ref 70–99)
Potassium: 4.8 mmol/L (ref 3.5–5.2)
Sodium: 143 mmol/L (ref 134–144)
Total Protein: 6.4 g/dL (ref 6.0–8.5)
eGFR: 38 mL/min/1.73 — ABNORMAL LOW (ref >59)

## 2024-08-29 LAB — PRO B NATRIURETIC PEPTIDE: NT-Pro BNP: 310 pg/mL (ref 0–486)

## 2024-08-30 ENCOUNTER — Ambulatory Visit: Payer: Self-pay | Admitting: Cardiology

## 2024-08-31 NOTE — Progress Notes (Unsigned)
 Cardiology Office Note:  .   Date:  09/01/2024  ID:  [name], DOB 02-28-1941, MRN [id number] PCP: Teresa Channel, MD   [facility] Providers Cardiologist:  Alm Clay, MD    History of Present Illness: .   Michah Minton Dech is a 83 y.o. male with history of CAD stent LAD 2023, HTN, PVC's/NSVT on amio and metoprolol .  Patient added onto my schedule for increased SOB, 10 lb weight gain and leg swelling. Crt up 1.79 and Dr. Clay didn't want to push lasix . BNP upper limits of normal 310. Patient here with his wife. Lasix  helped edema. He's getting extra salt with processed foods, meats, snacks. Has gained 15 lbs recently. Only has 1 kidney so worried about taking lasix  regularly. Stopped Sunday. Very little exercise due to hamstring tear and knee pain. Denies chest pain or palpitations.   ROS:    Studies Reviewed: SABRA         Prior CV Studies:   NST 12/2022 Findings are consistent with infarction. The study is low risk.   Rest ECG shows premature ventricular contractions in bigeminy.   No ST deviation was noted with stress. Arrhythmias during stress: occasional PVCs. Arrhythmias during recovery: frequent PVCs in bigeminy.   LV perfusion is abnormal. There is no evidence of ischemia. There is evidence of infarction. Defect 1: There is a small defect with moderate reduction in uptake present in the apical to mid inferior location(s) that is fixed. There is abnormal wall motion in the defect area. Consistent with infarction.   Left ventricular function is abnormal. Global function is mildly reduced. Nuclear stress EF: 48 %. The left ventricular ejection fraction is mildly decreased (45-54%). End diastolic cavity size is normal. End systolic cavity size is normal. No evidence of transient ischemic dilation (TID) noted.   Prior study available for comparison. No changes compared to prior study.  Echo 12/2022 IMPRESSIONS     1. Left ventricular ejection fraction, by  estimation, is 50 to 55%. Left  ventricular ejection fraction by PLAX is 52 %. The left ventricle has low  normal function. The left ventricle has no regional wall motion  abnormalities. Left ventricular diastolic  parameters are consistent with Grade II diastolic dysfunction  (pseudonormalization). Elevated left ventricular end-diastolic pressure.   2. Right ventricular systolic function is normal. The right ventricular  size is normal. There is normal pulmonary artery systolic pressure.   3. Left atrial size was mildly dilated.   4. The mitral valve is normal in structure. Mild mitral valve  regurgitation. No evidence of mitral stenosis.   5. The aortic valve is tricuspid. Aortic valve regurgitation is not  visualized. No aortic stenosis is present.   6. The inferior vena cava is dilated in size with >50% respiratory  variability, sugges  Risk Assessment/Calculations:             Physical Exam:   VS:  BP (!) 108/54   Pulse 61   Ht 5' 10.5 (1.791 m)   Wt 260 lb 8 oz (118.2 kg)   SpO2 91%   BMI 36.85 kg/m    Orhtostatics: No data found. Wt Readings from Last 3 Encounters:  09/01/24 260 lb 8 oz (118.2 kg)  11/13/23 245 lb (111.1 kg)  02/14/23 245 lb 6.4 oz (111.3 kg)    GEN: Obese, in no acute distress NECK: No JVD; No carotid bruits CARDIAC:  RRR, no murmurs, rubs, gallops RESPIRATORY:  Clear to auscultation without rales, wheezing or rhonchi  ABDOMEN: Soft, non-tender, non-distended EXTREMITIES:  trace right ankle edema; No deformity   ASSESSMENT AND PLAN: .    Diastolic CHF due to excessive salt in diet and recent weight gain. Edema improved with lasix  but Dr. Anner advised to stop with Crt 1.79(checked before lasix  started). Will repeat today, update 2 Decho. 2 gm sodium diet, weight loss. Lasix  prn weight gain of 2-3 lbs overnight.   CAD prior MI and stent dLAD-no angina  PVC's/NSVT on amiodarone  and metorolol Standard amiodarone  monitoring with annual eye  exams, LFTs, TFTs.   HTN-well controlled  Obesity-weight loss discussed, mediterranean diet.        Dispo: f/u Dr. Anner 6 months  Signed, Olivia Pavy, PA-C

## 2024-09-01 ENCOUNTER — Ambulatory Visit: Attending: Physician Assistant | Admitting: Physician Assistant

## 2024-09-01 ENCOUNTER — Encounter: Payer: Self-pay | Admitting: Physician Assistant

## 2024-09-01 VITALS — BP 108/54 | HR 61 | Ht 70.5 in | Wt 260.5 lb

## 2024-09-01 DIAGNOSIS — I493 Ventricular premature depolarization: Secondary | ICD-10-CM | POA: Diagnosis not present

## 2024-09-01 DIAGNOSIS — I2510 Atherosclerotic heart disease of native coronary artery without angina pectoris: Secondary | ICD-10-CM | POA: Diagnosis not present

## 2024-09-01 DIAGNOSIS — I4729 Other ventricular tachycardia: Secondary | ICD-10-CM

## 2024-09-01 DIAGNOSIS — I10 Essential (primary) hypertension: Secondary | ICD-10-CM

## 2024-09-01 DIAGNOSIS — I5032 Chronic diastolic (congestive) heart failure: Secondary | ICD-10-CM | POA: Diagnosis not present

## 2024-09-01 MED ORDER — FUROSEMIDE 40 MG PO TABS: 40.0000 mg | ORAL_TABLET | ORAL | Status: AC | PRN

## 2024-09-01 NOTE — Patient Instructions (Signed)
 Medication Instructions:  Your physician has recommended you make the following change in your medication:   Change Lasix  dose to as needed for edema or weight gain 2-3 pounds overnight.  *If you need a refill on your cardiac medications before your next appointment, please call your pharmacy*  Lab Work: BMET today If you have labs (blood work) drawn today and your tests are completely normal, you will receive your results only by: MyChart Message (if you have MyChart) OR A paper copy in the mail If you have any lab test that is abnormal or we need to change your treatment, we will call you to review the results.  Testing/Procedures: Your physician has requested that you have an echocardiogram. Echocardiography is a painless test that uses sound waves to create images of your heart. It provides your doctor with information about the size and shape of your heart and how well your heart's chambers and valves are working. This procedure takes approximately one hour. There are no restrictions for this procedure. Please do NOT wear cologne, perfume, aftershave, or lotions (deodorant is allowed). Please arrive 15 minutes prior to your appointment time.  Please note: We ask at that you not bring children with you during ultrasound (echo/ vascular) testing. Due to room size and safety concerns, children are not allowed in the ultrasound rooms during exams. Our front office staff cannot provide observation of children in our lobby area while testing is being conducted. An adult accompanying a patient to their appointment will only be allowed in the ultrasound room at the discretion of the ultrasound technician under special circumstances. We apologize for any inconvenience.   Follow-Up: At [facility], you and your health needs are our priority.  As part of our continuing mission to provide you with exceptional heart care, our providers are all part of one team.  This team includes your  primary Cardiologist (physician) and Advanced Practice Providers or APPs (Physician Assistants and Nurse Practitioners) who all work together to provide you with the care you need, when you need it.  Your next appointment:   6 months with Dr Anner  We recommend signing up for the patient portal called MyChart.  Sign up information is provided on this After Visit Summary.  MyChart is used to connect with patients for Virtual Visits (Telemedicine).  Patients are able to view lab/test results, encounter notes, upcoming appointments, etc.  Non-urgent messages can be sent to your provider as well.   To learn more about what you can do with MyChart, go to [email or web address].

## 2024-09-02 ENCOUNTER — Ambulatory Visit: Payer: Self-pay | Admitting: Physician Assistant

## 2024-09-02 LAB — BASIC METABOLIC PANEL WITH GFR
BUN/Creatinine Ratio: 23 (ref 10–24)
BUN: 38 mg/dL — ABNORMAL HIGH (ref 8–27)
CO2: 24 mmol/L (ref 20–29)
Calcium: 8.9 mg/dL (ref 8.6–10.2)
Chloride: 103 mmol/L (ref 96–106)
Creatinine, Ser: 1.67 mg/dL — ABNORMAL HIGH (ref 0.76–1.27)
Glucose: 101 mg/dL — ABNORMAL HIGH (ref 70–99)
Potassium: 5 mmol/L (ref 3.5–5.2)
Sodium: 142 mmol/L (ref 134–144)
eGFR: 41 mL/min/1.73 — ABNORMAL LOW (ref >59)

## 2024-09-02 NOTE — Progress Notes (Signed)
 Patient has been notified directly; all questions, if any, were answered. Patient voiced understanding. Patient says he does have a kidney doctor.

## 2024-09-03 DIAGNOSIS — N401 Enlarged prostate with lower urinary tract symptoms: Secondary | ICD-10-CM | POA: Diagnosis not present

## 2024-09-08 DIAGNOSIS — F411 Generalized anxiety disorder: Secondary | ICD-10-CM | POA: Diagnosis not present

## 2024-09-08 DIAGNOSIS — F330 Major depressive disorder, recurrent, mild: Secondary | ICD-10-CM | POA: Diagnosis not present

## 2024-09-10 DIAGNOSIS — N401 Enlarged prostate with lower urinary tract symptoms: Secondary | ICD-10-CM | POA: Diagnosis not present

## 2024-09-10 DIAGNOSIS — R3915 Urgency of urination: Secondary | ICD-10-CM | POA: Diagnosis not present

## 2024-09-19 DIAGNOSIS — W19XXXA Unspecified fall, initial encounter: Secondary | ICD-10-CM | POA: Diagnosis not present

## 2024-09-19 DIAGNOSIS — S51019A Laceration without foreign body of unspecified elbow, initial encounter: Secondary | ICD-10-CM | POA: Diagnosis not present

## 2024-09-22 DIAGNOSIS — N183 Chronic kidney disease, stage 3 unspecified: Secondary | ICD-10-CM | POA: Diagnosis not present

## 2024-09-22 DIAGNOSIS — D649 Anemia, unspecified: Secondary | ICD-10-CM | POA: Diagnosis not present

## 2024-09-22 DIAGNOSIS — M4802 Spinal stenosis, cervical region: Secondary | ICD-10-CM | POA: Diagnosis not present

## 2024-09-22 DIAGNOSIS — S5002XD Contusion of left elbow, subsequent encounter: Secondary | ICD-10-CM | POA: Diagnosis not present

## 2024-09-22 DIAGNOSIS — R29898 Other symptoms and signs involving the musculoskeletal system: Secondary | ICD-10-CM | POA: Diagnosis not present

## 2024-09-24 ENCOUNTER — Other Ambulatory Visit (HOSPITAL_COMMUNITY): Payer: Self-pay | Admitting: Family Medicine

## 2024-09-24 DIAGNOSIS — M4802 Spinal stenosis, cervical region: Secondary | ICD-10-CM

## 2024-09-24 DIAGNOSIS — M48061 Spinal stenosis, lumbar region without neurogenic claudication: Secondary | ICD-10-CM

## 2024-09-29 ENCOUNTER — Ambulatory Visit (HOSPITAL_COMMUNITY)
Admission: RE | Admit: 2024-09-29 | Discharge: 2024-09-29 | Disposition: A | Discharge status: Home / Self Care | Source: Ambulatory Visit | Attending: Family Medicine | Admitting: Family Medicine

## 2024-09-29 DIAGNOSIS — M48061 Spinal stenosis, lumbar region without neurogenic claudication: Secondary | ICD-10-CM | POA: Insufficient documentation

## 2024-09-29 DIAGNOSIS — M4802 Spinal stenosis, cervical region: Secondary | ICD-10-CM | POA: Insufficient documentation

## 2024-09-29 DIAGNOSIS — M5023 Other cervical disc displacement, cervicothoracic region: Secondary | ICD-10-CM | POA: Diagnosis not present

## 2024-09-29 DIAGNOSIS — M4803 Spinal stenosis, cervicothoracic region: Secondary | ICD-10-CM | POA: Diagnosis not present

## 2024-09-29 DIAGNOSIS — M47812 Spondylosis without myelopathy or radiculopathy, cervical region: Secondary | ICD-10-CM | POA: Diagnosis not present

## 2024-10-02 ENCOUNTER — Other Ambulatory Visit: Payer: Self-pay | Admitting: Cardiology

## 2024-10-07 DIAGNOSIS — M519 Unspecified thoracic, thoracolumbar and lumbosacral intervertebral disc disorder: Secondary | ICD-10-CM | POA: Diagnosis not present

## 2024-10-07 DIAGNOSIS — Z6834 Body mass index (BMI) 34.0-34.9, adult: Secondary | ICD-10-CM | POA: Diagnosis not present

## 2024-10-16 DIAGNOSIS — D649 Anemia, unspecified: Secondary | ICD-10-CM | POA: Diagnosis not present

## 2024-10-16 DIAGNOSIS — E039 Hypothyroidism, unspecified: Secondary | ICD-10-CM | POA: Diagnosis not present

## 2024-10-16 DIAGNOSIS — N183 Chronic kidney disease, stage 3 unspecified: Secondary | ICD-10-CM | POA: Diagnosis not present

## 2024-10-16 DIAGNOSIS — E785 Hyperlipidemia, unspecified: Secondary | ICD-10-CM | POA: Diagnosis not present

## 2024-10-16 DIAGNOSIS — M109 Gout, unspecified: Secondary | ICD-10-CM | POA: Diagnosis not present

## 2024-10-16 DIAGNOSIS — I129 Hypertensive chronic kidney disease with stage 1 through stage 4 chronic kidney disease, or unspecified chronic kidney disease: Secondary | ICD-10-CM | POA: Diagnosis not present

## 2024-10-19 ENCOUNTER — Other Ambulatory Visit: Payer: Self-pay

## 2024-10-19 ENCOUNTER — Ambulatory Visit (HOSPITAL_COMMUNITY)
Admission: RE | Admit: 2024-10-19 | Discharge: 2024-10-19 | Attending: Physician Assistant | Admitting: Physician Assistant

## 2024-10-19 DIAGNOSIS — Z0000 Encounter for general adult medical examination without abnormal findings: Secondary | ICD-10-CM | POA: Diagnosis not present

## 2024-10-19 DIAGNOSIS — I480 Paroxysmal atrial fibrillation: Secondary | ICD-10-CM | POA: Diagnosis not present

## 2024-10-19 DIAGNOSIS — I5032 Chronic diastolic (congestive) heart failure: Secondary | ICD-10-CM | POA: Diagnosis not present

## 2024-10-19 DIAGNOSIS — F331 Major depressive disorder, recurrent, moderate: Secondary | ICD-10-CM | POA: Diagnosis not present

## 2024-10-19 DIAGNOSIS — I5030 Unspecified diastolic (congestive) heart failure: Secondary | ICD-10-CM | POA: Diagnosis not present

## 2024-10-19 DIAGNOSIS — N183 Chronic kidney disease, stage 3 unspecified: Secondary | ICD-10-CM | POA: Diagnosis not present

## 2024-10-19 DIAGNOSIS — Z23 Encounter for immunization: Secondary | ICD-10-CM | POA: Diagnosis not present

## 2024-10-19 DIAGNOSIS — R002 Palpitations: Secondary | ICD-10-CM

## 2024-10-19 LAB — ECHOCARDIOGRAM COMPLETE
Area-P 1/2: 3.16 cm2
MV M vel: 5.32 m/s
MV Peak grad: 113.2 mmHg
Radius: 0.7 cm
S' Lateral: 3 cm

## 2024-10-20 MED ORDER — METOPROLOL TARTRATE 25 MG PO TABS: ORAL_TABLET | ORAL | 2 refills | Status: AC

## 2024-11-12 ENCOUNTER — Other Ambulatory Visit: Payer: Self-pay | Admitting: Cardiology
